# Patient Record
Sex: Female | Born: 1985
Health system: Southern US, Community
[De-identification: ages and names within clinical notes are randomized; demographics above are authoritative.]

## PROBLEM LIST (undated history)

## (undated) DIAGNOSIS — M797 Fibromyalgia: Secondary | ICD-10-CM

## (undated) DIAGNOSIS — K760 Fatty (change of) liver, not elsewhere classified: Secondary | ICD-10-CM

## (undated) DIAGNOSIS — J309 Allergic rhinitis, unspecified: Secondary | ICD-10-CM

## (undated) DIAGNOSIS — K589 Irritable bowel syndrome without diarrhea: Secondary | ICD-10-CM

## (undated) DIAGNOSIS — Q796 Ehlers-Danlos syndrome, unspecified: Secondary | ICD-10-CM

## (undated) DIAGNOSIS — F32A Depression, unspecified: Secondary | ICD-10-CM

## (undated) DIAGNOSIS — J189 Pneumonia, unspecified organism: Secondary | ICD-10-CM

## (undated) DIAGNOSIS — J302 Other seasonal allergic rhinitis: Secondary | ICD-10-CM

## (undated) DIAGNOSIS — G43909 Migraine, unspecified, not intractable, without status migrainosus: Secondary | ICD-10-CM

## (undated) DIAGNOSIS — M179 Osteoarthritis of knee, unspecified: Secondary | ICD-10-CM

## (undated) DIAGNOSIS — E88819 Insulin resistance, unspecified: Secondary | ICD-10-CM

## (undated) DIAGNOSIS — F909 Attention-deficit hyperactivity disorder, unspecified type: Secondary | ICD-10-CM

## (undated) DIAGNOSIS — F419 Anxiety disorder, unspecified: Secondary | ICD-10-CM

## (undated) DIAGNOSIS — N200 Calculus of kidney: Secondary | ICD-10-CM

## (undated) DIAGNOSIS — K829 Disease of gallbladder, unspecified: Secondary | ICD-10-CM

## (undated) DIAGNOSIS — E559 Vitamin D deficiency, unspecified: Secondary | ICD-10-CM

## (undated) DIAGNOSIS — S3992XA Unspecified injury of lower back, initial encounter: Secondary | ICD-10-CM

## (undated) DIAGNOSIS — M549 Dorsalgia, unspecified: Secondary | ICD-10-CM

## (undated) DIAGNOSIS — Z9889 Other specified postprocedural states: Secondary | ICD-10-CM

## (undated) DIAGNOSIS — M2550 Pain in unspecified joint: Secondary | ICD-10-CM

## (undated) DIAGNOSIS — E669 Obesity, unspecified: Secondary | ICD-10-CM

## (undated) DIAGNOSIS — M1710 Unilateral primary osteoarthritis, unspecified knee: Secondary | ICD-10-CM

## (undated) DIAGNOSIS — F329 Major depressive disorder, single episode, unspecified: Secondary | ICD-10-CM

## (undated) DIAGNOSIS — R112 Nausea with vomiting, unspecified: Secondary | ICD-10-CM

## (undated) DIAGNOSIS — K219 Gastro-esophageal reflux disease without esophagitis: Secondary | ICD-10-CM

## (undated) DIAGNOSIS — E8881 Metabolic syndrome: Secondary | ICD-10-CM

## (undated) HISTORY — DX: Fatty (change of) liver, not elsewhere classified: K76.0

## (undated) HISTORY — PX: WRIST ARTHROSCOPY: SUR100

## (undated) HISTORY — DX: Depression, unspecified: F32.A

## (undated) HISTORY — DX: Fibromyalgia: M79.7

## (undated) HISTORY — DX: Gastro-esophageal reflux disease without esophagitis: K21.9

## (undated) HISTORY — DX: Disease of gallbladder, unspecified: K82.9

## (undated) HISTORY — PX: CHOLECYSTECTOMY: SHX55

## (undated) HISTORY — DX: Irritable bowel syndrome, unspecified: K58.9

## (undated) HISTORY — DX: Allergic rhinitis, unspecified: J30.9

## (undated) HISTORY — DX: Calculus of kidney: N20.0

## (undated) HISTORY — DX: Pain in unspecified joint: M25.50

## (undated) HISTORY — DX: Ehlers-Danlos syndrome, unspecified: Q79.60

## (undated) HISTORY — DX: Attention-deficit hyperactivity disorder, unspecified type: F90.9

## (undated) HISTORY — DX: Pneumonia, unspecified organism: J18.9

## (undated) HISTORY — DX: Dorsalgia, unspecified: M54.9

## (undated) HISTORY — PX: WISDOM TOOTH EXTRACTION: SHX21

## (undated) HISTORY — DX: Anxiety disorder, unspecified: F41.9

## (undated) HISTORY — DX: Unspecified injury of lower back, initial encounter: S39.92XA

## (undated) HISTORY — PX: KNEE ARTHROSCOPY: SUR90

## (undated) HISTORY — DX: Major depressive disorder, single episode, unspecified: F32.9

## (undated) HISTORY — PX: MOUTH SURGERY: SHX715

## (undated) HISTORY — PX: DG DILATION URETERS: HXRAD351

---
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
## 2009-08-10 ENCOUNTER — Other Ambulatory Visit: Admission: RE | Admit: 2009-08-10 | Discharge: 2009-08-10 | Payer: Self-pay | Admitting: Family Medicine

## 2010-06-23 ENCOUNTER — Ambulatory Visit: Payer: Self-pay | Admitting: Women's Health

## 2010-11-08 ENCOUNTER — Ambulatory Visit
Admission: RE | Admit: 2010-11-08 | Discharge: 2010-11-08 | Disposition: A | Discharge status: Home / Self Care | Payer: BC Managed Care – PPO | Source: Ambulatory Visit | Attending: Allergy | Admitting: Allergy

## 2010-11-08 ENCOUNTER — Other Ambulatory Visit: Payer: Self-pay | Admitting: Allergy

## 2010-11-08 DIAGNOSIS — J45909 Unspecified asthma, uncomplicated: Secondary | ICD-10-CM

## 2010-11-29 ENCOUNTER — Encounter (INDEPENDENT_AMBULATORY_CARE_PROVIDER_SITE_OTHER): Payer: BC Managed Care – PPO | Admitting: Women's Health

## 2010-11-29 ENCOUNTER — Other Ambulatory Visit (HOSPITAL_COMMUNITY)
Admission: RE | Admit: 2010-11-29 | Discharge: 2010-11-29 | Disposition: A | Discharge status: Home / Self Care | Payer: BC Managed Care – PPO | Source: Ambulatory Visit | Attending: Gynecology | Admitting: Gynecology

## 2010-11-29 ENCOUNTER — Other Ambulatory Visit: Payer: Self-pay | Admitting: Women's Health

## 2010-11-29 DIAGNOSIS — Z124 Encounter for screening for malignant neoplasm of cervix: Secondary | ICD-10-CM | POA: Insufficient documentation

## 2010-11-29 DIAGNOSIS — Z23 Encounter for immunization: Secondary | ICD-10-CM

## 2010-11-29 DIAGNOSIS — Z01419 Encounter for gynecological examination (general) (routine) without abnormal findings: Secondary | ICD-10-CM

## 2010-12-08 NOTE — Op Note (Signed)
[place]  Patient:    Cynthia [name], Cynthia [name] Visit Number: [id number] MRN: [id number]          Service Type: DSU Location: [facility] Attending Physician:  Verlee Rossetti. Dictated by:   Almedia Balls Ranell Patrick, M.D. Proc. Date: 07/17/01 Admit Date:  07/17/2001 Discharge Date: 07/17/2001                             Operative Report  PREOPERATIVE DIAGNOSIS:  Right shoulder pain.  POSTOPERATIVE DIAGNOSIS:  Right shoulder multidirectional instability.  PROCEDURE PERFORMED:  Right shoulder diagnostic arthroscopy, examination under anesthesia, and arthroscopic thermal capsulorrhaphy.  SURGEON:  Almedia Balls. Ranell Patrick, M.D.  ANESTHESIA:  General.  ESTIMATED BLOOD LOSS:  Minimal.  FLUID REPLACEMENT:  800 cc of crystalloid.  INSTRUMENT COUNT:  Correct.  COMPLICATIONS:  None.  Perioperative antibiotics were given.  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  The patient reports a fall on February 22, 2001, playing directly on to her elbow, causing her to experiencing a jarring injury to her right shoulder.  She has had persistent pain since that time.  She is heavily involved in cheerleading, training almost every day, and has had difficulty with over head activities and "basing."   This involves supporting other cheerleaders with her hands above her head.  The pain is in the posterior aspect of her shoulder and is muscular in its character.  On physical examination in the office initially, the patient was felt to have multidirectional instability, primarily anterior and inferior with positive subluxation and relocation test, and no other obvious pathology.  She had an MRI scan which did not demonstrate obvious labral pathology, and was seen by Dr. Valma Cava who was concerned as well about her lack of improvement despite numerous physical therapy appointments and significant  improvement in her shoulder stabilizing muscles.  After careful consultation with the patient and her parents concerning options for management for the patients shoulder pain include activity modification, continued physical therapy, and medications versus surgical evaluation and treatment of her shoulder pain, and they elected to proceed with surgery to rule out labral pathology versus shoulder laxity.  DESCRIPTION OF PROCEDURE:  After an adequate level of general anesthesia was achieved, the patient was positioned in the modified beach chair position, and all neurovascular structures were padded appropriately.  The right arm was then examined under anesthesia and was noted to have a negative sulcus. Forward flexion was 180 degrees.  Abduction was 120 degrees.  External rotation was 90 degrees and internal rotation was 85 degrees.  There was a 1-2+ anterior load and shift with the ability to perch the humeral head on the anterior glenoid rim, but not dislocate the shoulder.  There was no posterior load and shift present.  There is no posterior instability with the elbow extended, and a posterior directed force with the arm internally rotated. During external rotation of the shoulder with the arm abducted, there appeared to be no tensioning of the anterior-inferior glenohumeral ligament with persistent 1-2+ anterior drawer present.  After completion of the exam under anesthesia, the right shoulder and arm were prepped and draped in their entirety in the usual sterile fashion.  Diagnostic arthroscopy was performed through standard posterior and anterior arthroscopic portals.  This revealed no evidence of pathology at the superior labrum, biceps anchor.  The middle glenohumeral ligament was identified and it was noted  to be intact.  Subscapularis was also noted to be intact and normal in appearance.  Rotator interval appeared to be wispy and thin.  The rotator cuff was completely normal in  its appearance and insertion.  The glenohumeral articular cartilage was also normal with no evidence of abnormal wear.  There was no internal impingement with abduction, external rotation of the shoulder. Posterior labrum was also noted to be intact with the arthroscope placed in the anterior portal.  The pouch appeared to be patulous and the inferior glenohumeral ligament appeared to be laxed.  There was an obviously positive drive through with the arm abducted and externally rotated as well as with the arm in the neutral.  These arthroscopic findings were consistent with her examination under anesthesia, demonstrating anterior and inferior laxity.  It was decided at this time to proceed with thermal capsulorrhaphy which was performed using the ArthroCare capture wand system.  A 30 degree wand was selected.  A cross-_________ thermal pattern was then performed, concentrating on the anterior and inferior capsule to include the anterior band of the inferior glenohumeral ligament.  The middle glenohumeral ligament was also tightened minimally.  The scope and probe were then reversed and a mild amount of shrinkage was performed on the posterior aspect of the capsule.  After completion of thermal capsulorrhaphy, the scope was placed back in the posterior aspect of the shoulder and an attempted drive through sign was performed.  This was unsuccessful.  The patient was then placed in the inferior pouch.  There was noted to be a dramatic decrease in the size of the pouch.  In addition, the anterior and inferior labrum which appeared to be more prominent, and the anterior band of the inferior glenohumeral ligament appeared to be more tense, consistent with a successful shrinkage.  The arthroscopy was then concluded.  Wounds were sutured using 4-0 Monocryl subcuticular followed by Steri-Strips, and a sterile bandage was applied followed by a shoulder immobilizer.  The patient was taken in stable  condition, extubated to PACU. Dictated by:   Almedia Balls Ranell Patrick, M.D. Attending Physician:  Malon Kindle R. DD:  07/19/01 TD:  07/20/01 Job: [id number]  [id number]

## 2011-03-08 ENCOUNTER — Other Ambulatory Visit: Payer: Self-pay | Admitting: Allergy

## 2011-03-08 ENCOUNTER — Ambulatory Visit
Admission: RE | Admit: 2011-03-08 | Discharge: 2011-03-08 | Disposition: A | Discharge status: Home / Self Care | Payer: BC Managed Care – PPO | Source: Ambulatory Visit | Attending: Allergy | Admitting: Allergy

## 2011-03-08 DIAGNOSIS — R059 Cough, unspecified: Secondary | ICD-10-CM

## 2011-03-08 DIAGNOSIS — R062 Wheezing: Secondary | ICD-10-CM

## 2011-03-08 DIAGNOSIS — R05 Cough: Secondary | ICD-10-CM

## 2011-06-08 ENCOUNTER — Other Ambulatory Visit: Payer: Self-pay | Admitting: *Deleted

## 2011-06-08 ENCOUNTER — Ambulatory Visit: Payer: BC Managed Care – PPO

## 2011-06-08 MED ORDER — NORETHINDRONE 0.35 MG PO TABS: 1.0000 | ORAL_TABLET | Freq: Every day | ORAL | Status: DC

## 2011-06-25 DIAGNOSIS — J45909 Unspecified asthma, uncomplicated: Secondary | ICD-10-CM | POA: Insufficient documentation

## 2011-06-25 DIAGNOSIS — S3992XA Unspecified injury of lower back, initial encounter: Secondary | ICD-10-CM | POA: Insufficient documentation

## 2011-06-25 DIAGNOSIS — J189 Pneumonia, unspecified organism: Secondary | ICD-10-CM | POA: Insufficient documentation

## 2011-07-02 ENCOUNTER — Ambulatory Visit (INDEPENDENT_AMBULATORY_CARE_PROVIDER_SITE_OTHER): Payer: BC Managed Care – PPO | Admitting: Women's Health

## 2011-07-02 VITALS — BP 114/78

## 2011-07-02 DIAGNOSIS — IMO0001 Reserved for inherently not codable concepts without codable children: Secondary | ICD-10-CM

## 2011-07-02 DIAGNOSIS — Z309 Encounter for contraceptive management, unspecified: Secondary | ICD-10-CM

## 2011-07-02 DIAGNOSIS — Z23 Encounter for immunization: Secondary | ICD-10-CM

## 2011-07-02 NOTE — Progress Notes (Signed)
Patient ID: Cynthia [name], female   DOB: [birth year], 25 y.o.   MRN: [id number] Presents for second Gardasil and to discuss blood pressure and birth control for cycle control. Virgin,  states since on Micronor cycles have lasted 7-8 days heavier than her usual and states has felt moody on Micronor. Blood pressure was elevated at annual exam in May 160/80, but had been on steroids and numerous medications for asthma. Changed from Loestrin 1/20 to Micronor in May. States blood pressure has been down in the normal range in the summer months when on no asthma medications. BP today 114/78.  Second Gardasil given today. Will try the lo Loestrin FE. Instructed to start first day of her next cycle. Has annual exam with her primary care in January. Reviewed importance of stopping lo Loestrin if blood pressure greater than 130/80. No prescription given, 3 sample packs given. Return to office in April for third and final Gardasil and blood pressure check.

## 2011-07-02 NOTE — Patient Instructions (Signed)
B/P114/78  Gardasil in April for 3 rd gardasil and B/P check  Start Loloestrin on first day of cycle

## 2011-07-02 NOTE — Progress Notes (Signed)
Addended by: Venora Maples on: 07/02/2011 09:36 AM   Modules accepted: Orders

## 2011-09-24 ENCOUNTER — Telehealth: Payer: Self-pay | Admitting: *Deleted

## 2011-09-24 MED ORDER — NORETHIN ACE-ETH ESTRAD-FE 1-20 MG-MCG(24) PO TABS: 1.0000 | ORAL_TABLET | Freq: Every day | ORAL | Status: DC

## 2011-09-24 NOTE — Telephone Encounter (Signed)
(  f/u from 06/2011 OV) Pt called wanting to relay her blood pressure reading. Pt said for 3 months she has check B/P and it has been in range of 115-120 over 80. Pt asked if birth control could be filled?

## 2011-09-24 NOTE — Telephone Encounter (Signed)
Pt informed rx sent to pharmacy.

## 2011-09-24 NOTE — Telephone Encounter (Signed)
Yes okay to call in one pack with one refill.

## 2011-09-24 NOTE — Telephone Encounter (Signed)
Pt said that her annual with her PCP will be in march. Pt is on last row and will run out on Saturday, she said that she has been taking reading herself. Okay to call in 1 pack?

## 2011-09-24 NOTE — Telephone Encounter (Signed)
Annual exam is due in April, we'll check blood pressure and give 3rd gardasil. Ask her if her blood pressure was normal at her January office visit with primary care. If okay: One pack of the lo Loestrin FE or she can pick up a sample pack.

## 2011-10-22 ENCOUNTER — Other Ambulatory Visit: Payer: Self-pay | Admitting: *Deleted

## 2011-10-22 MED ORDER — NORETHIN ACE-ETH ESTRAD-FE 1-20 MG-MCG(24) PO TABS: 1.0000 | ORAL_TABLET | Freq: Every day | ORAL | Status: DC

## 2011-12-18 ENCOUNTER — Other Ambulatory Visit: Payer: Self-pay | Admitting: *Deleted

## 2011-12-20 ENCOUNTER — Other Ambulatory Visit: Payer: Self-pay | Admitting: *Deleted

## 2011-12-20 MED ORDER — NORETHIN ACE-ETH ESTRAD-FE 1-20 MG-MCG(24) PO TABS: 1.0000 | ORAL_TABLET | Freq: Every day | ORAL | Status: DC

## 2011-12-20 NOTE — Telephone Encounter (Signed)
Pt called requesting refill on her loestrin fe pill, pt overdue to annual, transferred to appointment desk to schedule. 1 pack sent to pharmacy.

## 2012-01-09 ENCOUNTER — Ambulatory Visit (INDEPENDENT_AMBULATORY_CARE_PROVIDER_SITE_OTHER): Payer: BC Managed Care – PPO | Admitting: Women's Health

## 2012-01-09 ENCOUNTER — Encounter: Payer: Self-pay | Admitting: Women's Health

## 2012-01-09 VITALS — BP 120/88 | Ht 61.0 in | Wt 248.0 lb

## 2012-01-09 DIAGNOSIS — E66813 Obesity, class 3: Secondary | ICD-10-CM | POA: Insufficient documentation

## 2012-01-09 DIAGNOSIS — N946 Dysmenorrhea, unspecified: Secondary | ICD-10-CM

## 2012-01-09 DIAGNOSIS — E669 Obesity, unspecified: Secondary | ICD-10-CM

## 2012-01-09 DIAGNOSIS — Z01419 Encounter for gynecological examination (general) (routine) without abnormal findings: Secondary | ICD-10-CM

## 2012-01-09 DIAGNOSIS — Z23 Encounter for immunization: Secondary | ICD-10-CM

## 2012-01-09 DIAGNOSIS — Z833 Family history of diabetes mellitus: Secondary | ICD-10-CM

## 2012-01-09 DIAGNOSIS — Z13220 Encounter for screening for lipoid disorders: Secondary | ICD-10-CM

## 2012-01-09 DIAGNOSIS — E079 Disorder of thyroid, unspecified: Secondary | ICD-10-CM

## 2012-01-09 LAB — CBC WITH DIFFERENTIAL/PLATELET
Basophils Absolute: 0 10*3/uL (ref 0.0–0.1)
Basophils Relative: 0 % (ref 0–1)
Eosinophils Absolute: 0.1 10*3/uL (ref 0.0–0.7)
Eosinophils Relative: 1 % (ref 0–5)
HCT: 45 % (ref 36.0–46.0)
Hemoglobin: 15 g/dL (ref 12.0–15.0)
Lymphocytes Relative: 22 % (ref 12–46)
Lymphs Abs: 1.7 10*3/uL (ref 0.7–4.0)
MCH: 29.6 pg (ref 26.0–34.0)
MCHC: 33.3 g/dL (ref 30.0–36.0)
MCV: 88.9 fL (ref 78.0–100.0)
Monocytes Absolute: 0.7 10*3/uL (ref 0.1–1.0)
Monocytes Relative: 9 % (ref 3–12)
Neutro Abs: 5.3 10*3/uL (ref 1.7–7.7)
Neutrophils Relative %: 68 % (ref 43–77)
Platelets: 285 10*3/uL (ref 150–400)
RBC: 5.06 MIL/uL (ref 3.87–5.11)
RDW: 13.8 % (ref 11.5–15.5)
WBC: 7.8 10*3/uL (ref 4.0–10.5)

## 2012-01-09 LAB — LIPID PANEL
Cholesterol: 175 mg/dL (ref 0–200)
HDL: 52 mg/dL (ref >39)
LDL Cholesterol: 109 mg/dL — ABNORMAL HIGH (ref 0–99)
Total CHOL/HDL Ratio: 3.4 Ratio
Triglycerides: 71 mg/dL (ref <150)
VLDL: 14 mg/dL (ref 0–40)

## 2012-01-09 LAB — TSH: TSH: 1.85 u[IU]/mL (ref 0.350–4.500)

## 2012-01-09 LAB — GLUCOSE, RANDOM: Glucose, Bld: 88 mg/dL (ref 70–99)

## 2012-01-09 MED ORDER — NORETHIN ACE-ETH ESTRAD-FE 1-20 MG-MCG(24) PO TABS: 1.0000 | ORAL_TABLET | Freq: Every day | ORAL | Status: DC

## 2012-01-09 NOTE — Progress Notes (Signed)
Cynthia [name] 12/13/1985 [id number]    History:    The patient presents for annual exam.  Monthly 3-5 day cycle on Loestrin 1/20 for menorrhagia and dysmenorrhea with good relief. Virgin. Gardasil series completed today. History of asthma and ADHD primary care prescribes meds.   Past medical history, past surgical history, family history and social history were all reviewed and documented in the EPIC chart. Works for [employer] in [place].   ROS:  A  ROS was performed and pertinent positives and negatives are included in the history.  Exam:  Filed Vitals:   01/09/12 0915  BP: 120/88    General appearance:  Normal Head/Neck:  Normal, without cervical or supraclavicular adenopathy. Thyroid:  Symmetrical, normal in size, without palpable masses or nodularity. Respiratory  Effort:  Normal  Auscultation:  Clear without wheezing or rhonchi Cardiovascular  Auscultation:  Regular rate, without rubs, murmurs or gallops  Edema/varicosities:  Not grossly evident Abdominal  Soft,nontender, without masses, guarding or rebound.  Liver/spleen:  No organomegaly noted  Hernia:  None appreciated  Skin  Inspection:  Grossly normal  Palpation:  Grossly normal Neurologic/psychiatric  Orientation:  Normal with appropriate conversation.  Mood/affect:  Normal  Genitourinary    Breasts: Examined lying and sitting.     Right: Without masses, retractions, discharge or axillary adenopathy.     Left: Without masses, retractions, discharge or axillary adenopathy.   Inguinal/mons:  Normal without inguinal adenopathy  External genitalia:  Normal/hymenal ring at the introitus  BUS/Urethra/Skene's glands:  Normal  Bladder:  Normal  Vagina:  Normal  Cervix:  Normal  Uterus:   normal in size, shape and contour.  Midline and mobile  Adnexa/parametria:     Rt: Without masses or tenderness.   Lt: Without masses or tenderness.  Anus and perineum: Normal  Digital rectal exam: Normal  sphincter tone without palpated masses or tenderness  Assessment/Plan:  26 y.o. S [place] Virgin for annual exam with no complaints.  Blood pressure elevated at office visit today states normal 110/70 at primary care last week Morbid obesity Menorrhagia/dysmenorrhea good relief with Loestrin 1/20  Plan: Check blood pressure away from office call if pressure greater than 130/80. Reviewed hazards of blood clots and strokes with birth control with elevated blood pressure. Loestrin 1/20 prescription, proper use, condoms if become sexually active. SBE's, increase exercise, decrease calories for weight loss for health. MVI daily encouraged. CBC, glucose, TSH, UA. No Pap, normal Pap history, new guidelines reviewed.    Harrington Challenger WHNP, 1:22 PM 01/09/2012

## 2012-01-09 NOTE — Patient Instructions (Signed)

## 2012-01-10 LAB — URINALYSIS W MICROSCOPIC + REFLEX CULTURE
Bilirubin Urine: NEGATIVE
Casts: NONE SEEN
Crystals: NONE SEEN
Glucose, UA: NEGATIVE mg/dL
Hgb urine dipstick: NEGATIVE
Ketones, ur: NEGATIVE mg/dL
Leukocytes, UA: NEGATIVE
Nitrite: NEGATIVE
Protein, ur: NEGATIVE mg/dL
Specific Gravity, Urine: 1.02 (ref 1.005–1.030)
Urobilinogen, UA: 0.2 mg/dL (ref 0.0–1.0)
pH: 6 (ref 5.0–8.0)

## 2012-03-31 ENCOUNTER — Telehealth: Payer: Self-pay | Admitting: *Deleted

## 2012-03-31 DIAGNOSIS — N946 Dysmenorrhea, unspecified: Secondary | ICD-10-CM

## 2012-03-31 MED ORDER — NORETHINDRONE ACET-ETHINYL EST 1-20 MG-MCG PO TABS: 1.0000 | ORAL_TABLET | Freq: Every day | ORAL | Status: DC

## 2012-03-31 NOTE — Telephone Encounter (Signed)
Pt Cynthia [name] has been discontinued and she will need a new Rx for birth control pills. Please advise

## 2012-03-31 NOTE — Telephone Encounter (Signed)
Please call in loestrin 1/20 with refills.  Explain same med but for 21 days instead of 24.  Have her call if problems with change.

## 2012-03-31 NOTE — Telephone Encounter (Signed)
Pt informed with the below note, rx sent. 

## 2012-04-10 ENCOUNTER — Telehealth: Payer: Self-pay | Admitting: *Deleted

## 2012-04-10 MED ORDER — NORETHIN ACE-ETH ESTRAD-FE 1-20 MG-MCG(24) PO CHEW: 20.0000 ug | CHEWABLE_TABLET | Freq: Every day | ORAL | Status: DC

## 2012-04-10 NOTE — Telephone Encounter (Signed)
rx sent and pt informed with the below.

## 2012-04-10 NOTE — Telephone Encounter (Signed)
Okay to have Minastrin 24, coupon in closet to help with cost. Loestrin 1/20  is  21 day cycle/ generic,  it has 24 active days.

## 2012-04-10 NOTE — Telephone Encounter (Signed)
Follow up from telephone encounter 03/31/12. Pt asked if she could have the Minastrin 24 she would prefer to have 24 pill rather than 21. Okay to send rx? Please advise

## 2012-07-17 IMAGING — CR DG CHEST 2V
2 series · 2 of 2 positions shown · non-contrast
Comparison: None.

CLINICAL DATA: Asthma, cough, congestion and fever.

CHEST - 2 VIEW

[view not recorded (1 of 2)]
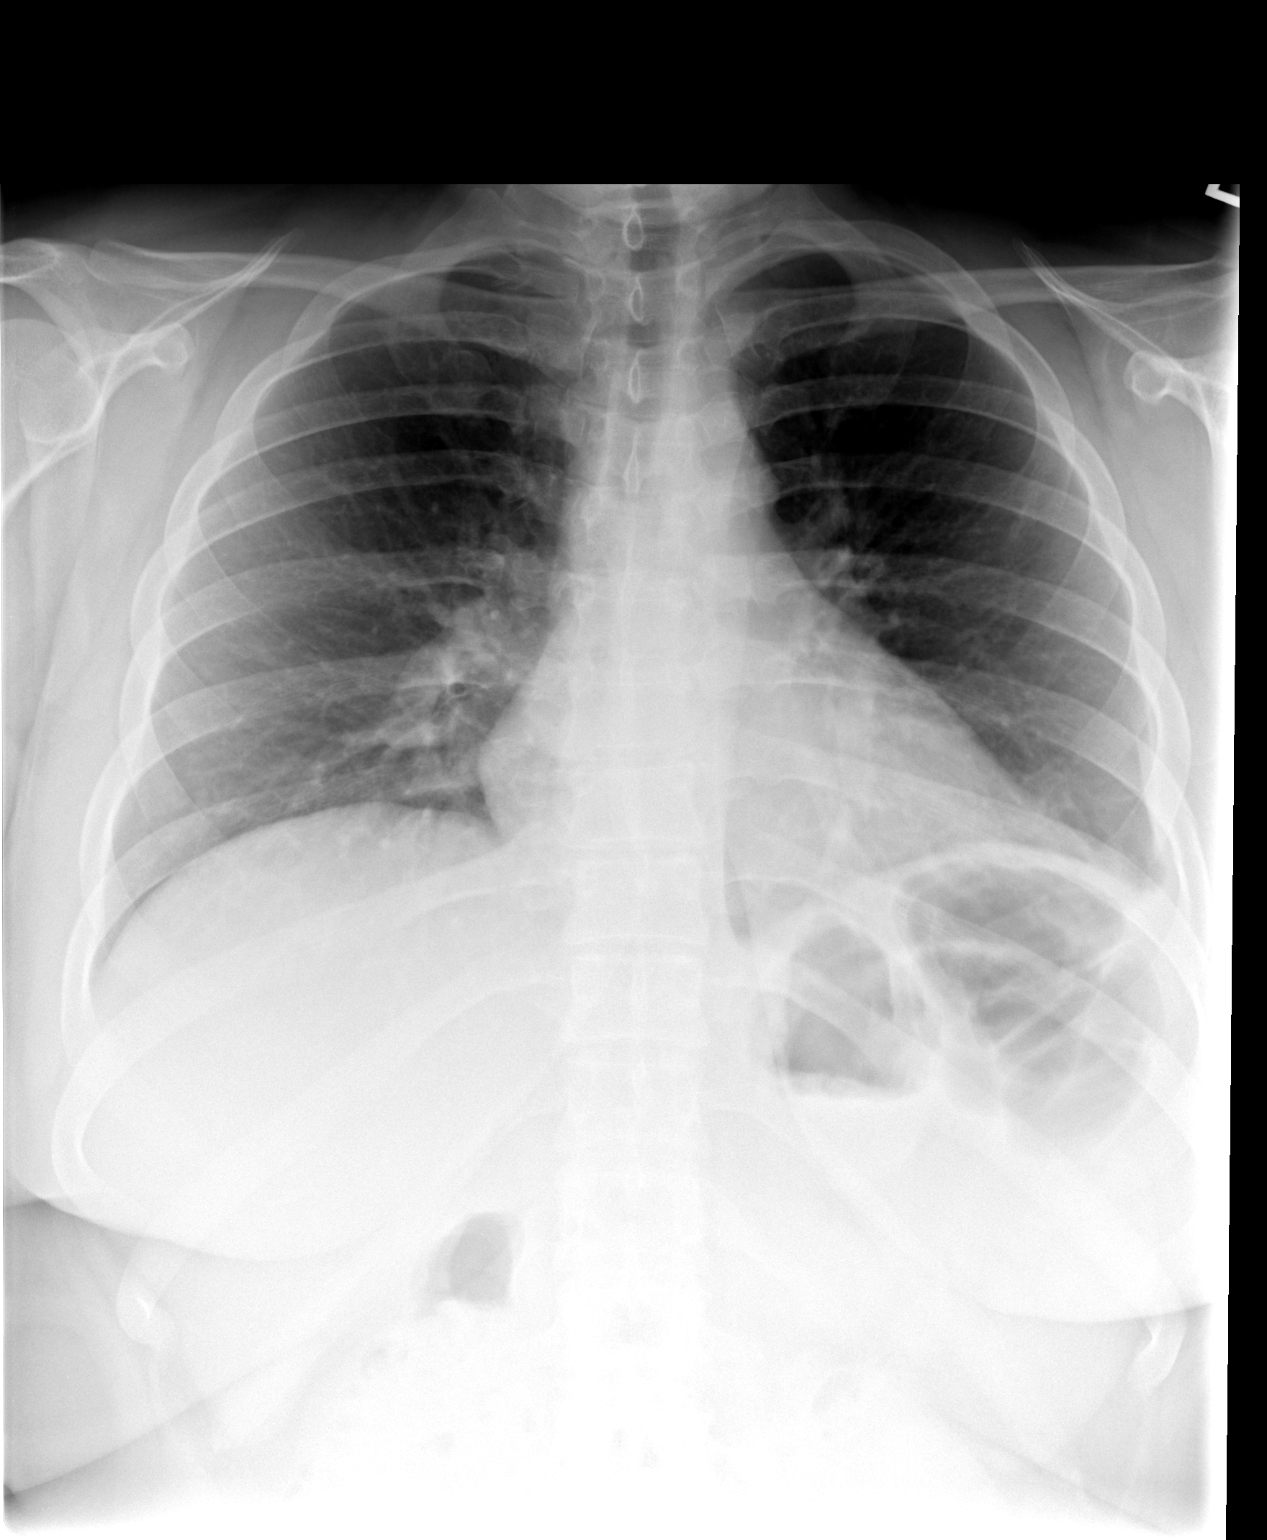

[view not recorded (2 of 2)]
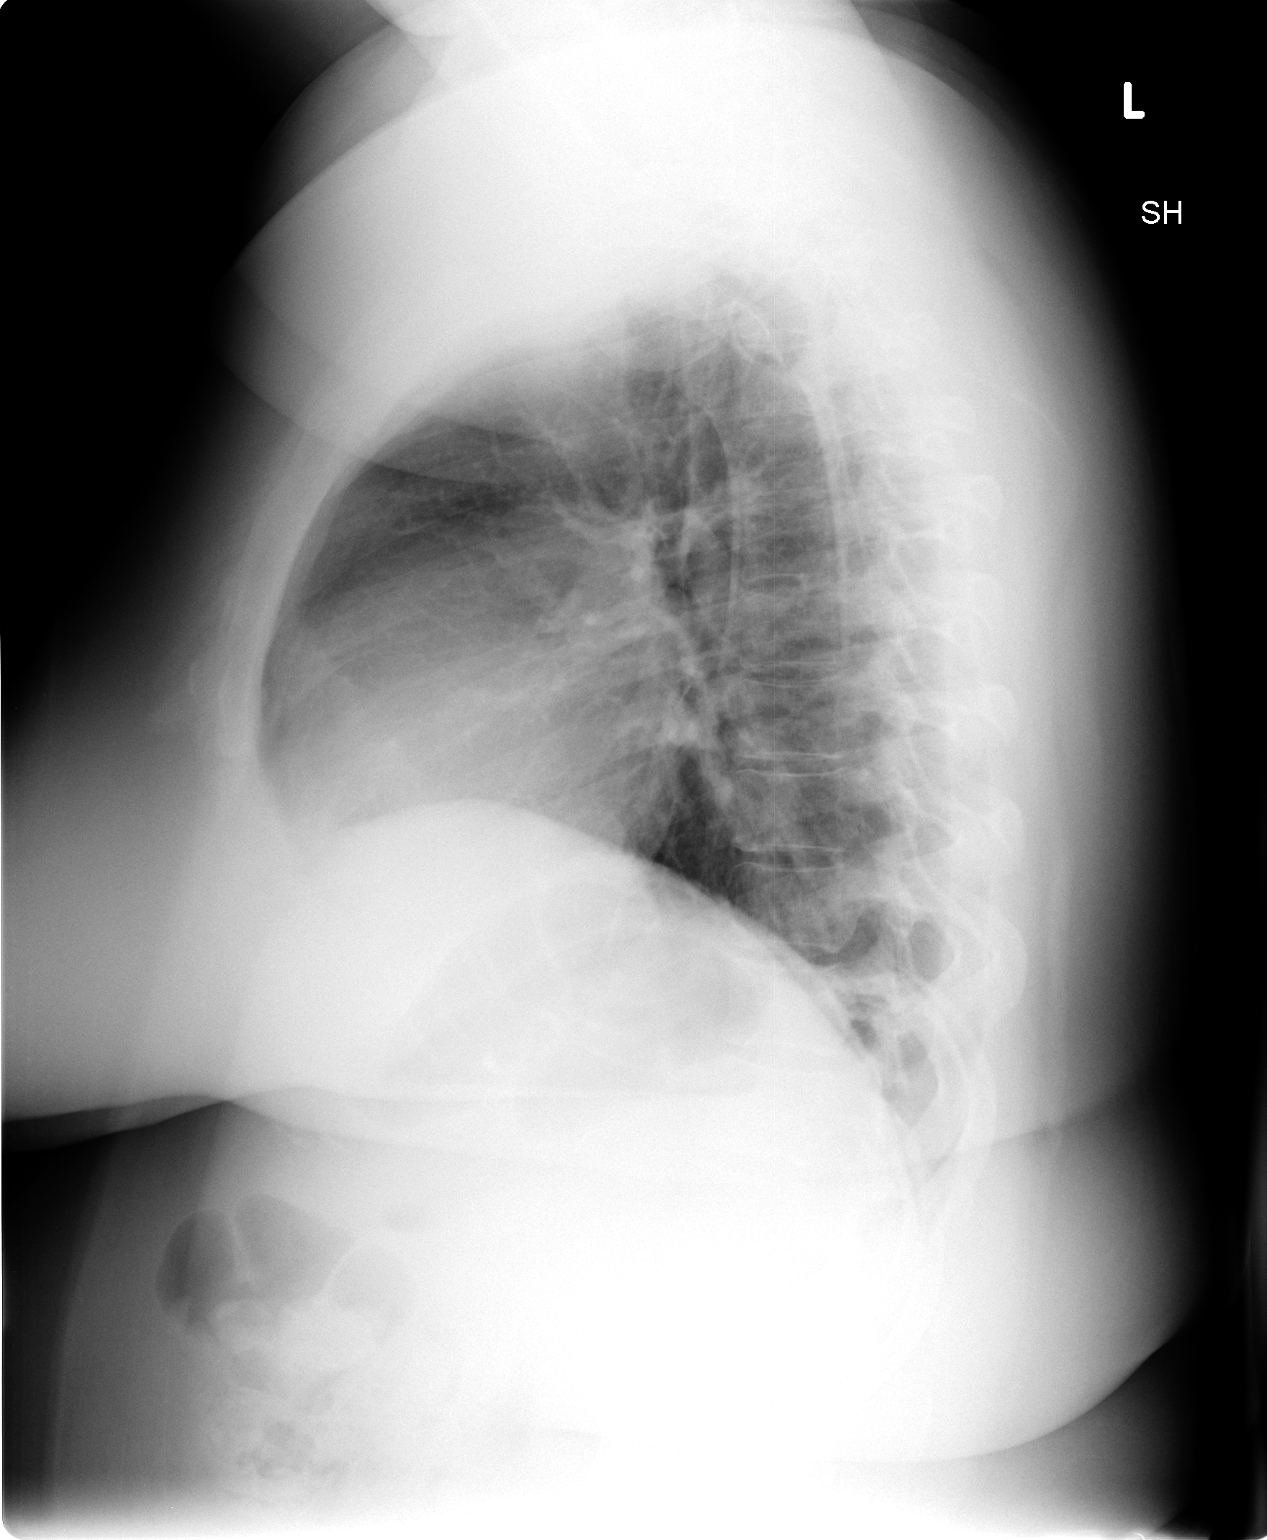

[2 of 2 positions shown; findings below may reference images not displayed]

FINDINGS: Trachea is midline.  Heart size normal.  Lungs are low in
volume with linear densities in the left lower lobe.  Blunting of
the left costophrenic angle.  Right lung is clear.
IMPRESSION: 1.  Left lower lobe subsegmental atelectasis and/or scarring.
2.  Tiny left pleural effusion or pleural thickening.

## 2012-11-14 IMAGING — CR DG CHEST 2V
2 series · 2 of 2 positions shown · non-contrast
Comparison: 11/08/2010

CLINICAL DATA: Cough, wheezing, left rib pain

CHEST - 2 VIEW

[view not recorded (1 of 2)]
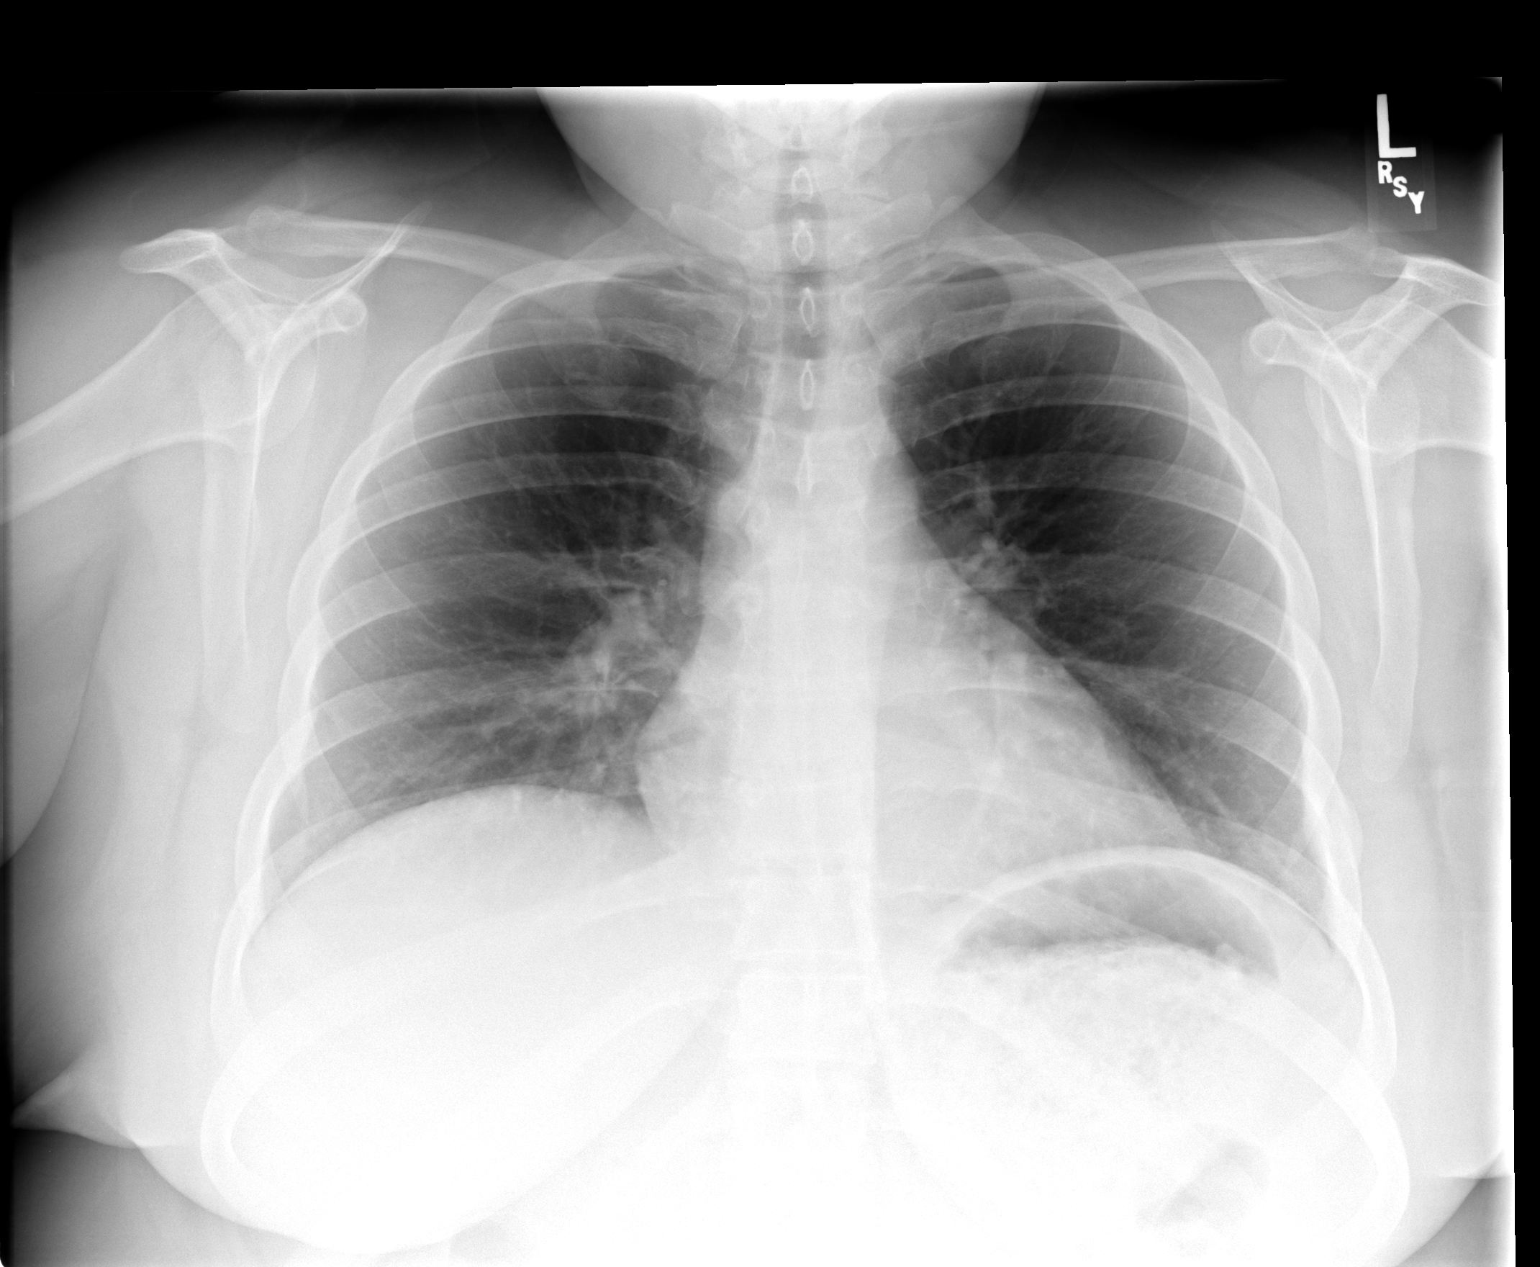

[view not recorded (2 of 2)]
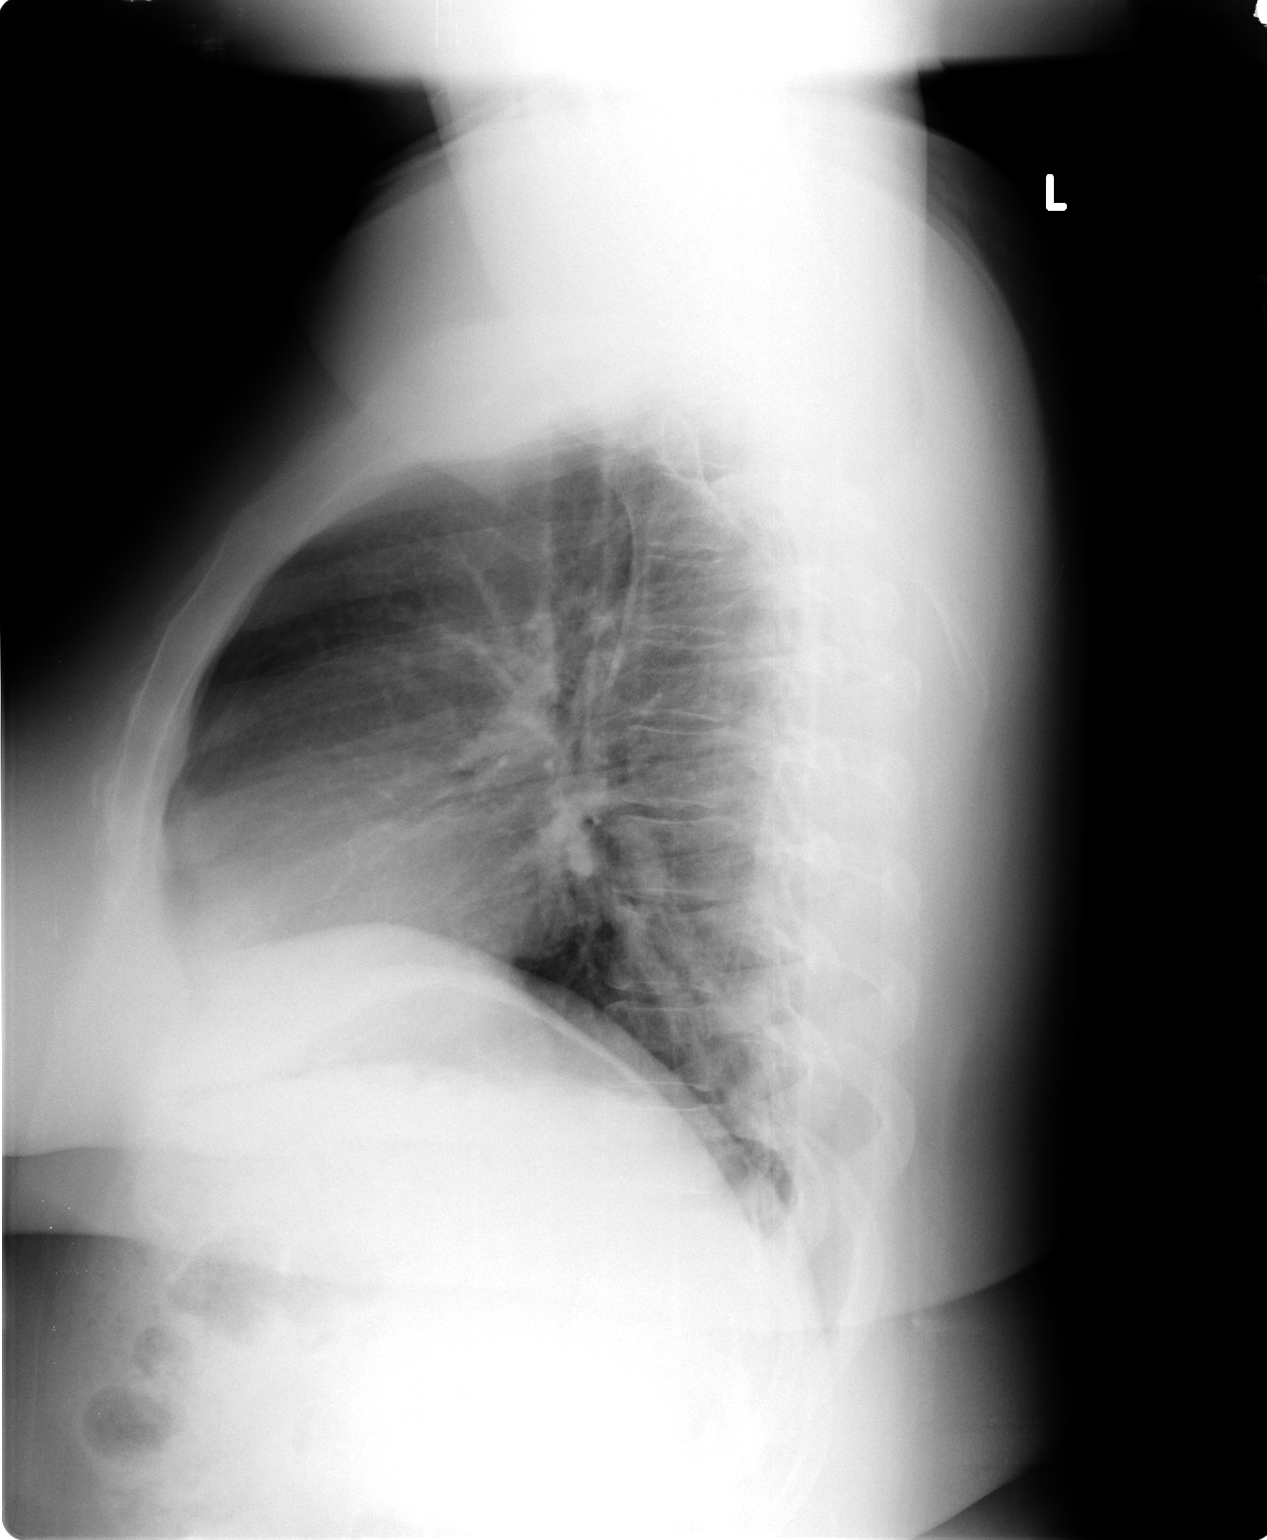

[2 of 2 positions shown; findings below may reference images not displayed]

FINDINGS: Unchanged cardiac silhouette and mediastinal contours.
Improved inspiratory effort with interval resolution of left lower
lobe heterogeneous opacities and aeration of the left costophrenic
angle.  No new focal parenchymal opacities.  No pleural effusion or
pneumothorax.  Interval callus formation about the posterior
lateral aspect of the left seventh rib.
IMPRESSION: 1.  No acute cardiopulmonary disease.

2. Interval callous formation about the posterior lateral aspect of
the left seventh rib, suggestive of remote injury.

## 2013-03-18 ENCOUNTER — Other Ambulatory Visit: Payer: Self-pay | Admitting: Family Medicine

## 2013-03-18 DIAGNOSIS — R1011 Right upper quadrant pain: Secondary | ICD-10-CM

## 2013-03-19 ENCOUNTER — Ambulatory Visit
Admission: RE | Admit: 2013-03-19 | Discharge: 2013-03-19 | Disposition: A | Discharge status: Home / Self Care | Payer: BC Managed Care – PPO | Source: Ambulatory Visit | Attending: Family Medicine | Admitting: Family Medicine

## 2013-03-19 ENCOUNTER — Other Ambulatory Visit: Payer: Self-pay | Admitting: Family Medicine

## 2013-03-19 DIAGNOSIS — R1011 Right upper quadrant pain: Secondary | ICD-10-CM

## 2013-03-20 ENCOUNTER — Encounter (INDEPENDENT_AMBULATORY_CARE_PROVIDER_SITE_OTHER): Payer: Self-pay | Admitting: Surgery

## 2013-03-20 ENCOUNTER — Other Ambulatory Visit: Payer: BC Managed Care – PPO

## 2013-03-30 ENCOUNTER — Ambulatory Visit (INDEPENDENT_AMBULATORY_CARE_PROVIDER_SITE_OTHER): Payer: BC Managed Care – PPO | Admitting: Surgery

## 2013-03-30 ENCOUNTER — Encounter (INDEPENDENT_AMBULATORY_CARE_PROVIDER_SITE_OTHER): Payer: Self-pay | Admitting: Surgery

## 2013-03-30 VITALS — BP 132/78 | HR 72 | Resp 14 | Ht 61.5 in | Wt 288.8 lb

## 2013-03-30 DIAGNOSIS — K8010 Calculus of gallbladder with chronic cholecystitis without obstruction: Secondary | ICD-10-CM | POA: Insufficient documentation

## 2013-03-30 NOTE — Progress Notes (Signed)
Patient ID: Cynthia [name], female   DOB: 09/19/1985, 27 y.o.   MRN: [id number]  Chief Complaint  Patient presents with  . New Evaluation    eval GB    HPI Cynthia [name] is a 27 y.o. female.  Referred by Dr. Ihor Dow for evaluation of gallbladder disease.  HPI  This is a 27 year old female who presents with a two-week history of acute onset of upper abdominal pain radiating around to her right shoulder and in her back. This was associated with nausea and bloating. This occurred after eating at [facility]. She denies any vomiting or diarrhea. She was evaluated at her PCPs office and was noted to have a normal white blood cell count, normal LFTs, and normal lipase. Ultrasound showed a contracted gallbladder containing gallstones but no sign of cholecystitis. She has had mild symptoms since that time. She has had milder episodes going back as far as 3 years ago. She presents now to discuss surgery.  Past Medical History  Diagnosis Date  . ADHD (attention deficit hyperactivity disorder)   . Asthma   . Pneumonia   . Back injury     COMPETITIVE INJURY IN HIGH SCHOOL  . Depression   . Anxiety     Past Surgical History  Procedure Laterality Date  . Shoulder surgery  2002    RIGHT    Family History  Problem Relation Age of Onset  . Multiple sclerosis Mother   . Hypertension Father   . Diabetes Maternal Grandfather   . Cancer Maternal Grandfather     prostate  . Heart disease Maternal Grandfather   . Heart disease Paternal Grandfather     Social History History  Substance Use Topics  . Smoking status: Never Smoker   . Smokeless tobacco: Never Used  . Alcohol Use: Yes    Allergies  Allergen Reactions  . Penicillins   rash   Current Outpatient Prescriptions  Medication Sig Dispense Refill  . amphetamine-dextroamphetamine (ADDERALL XR) 30 MG 24 hr capsule Take 30 mg by mouth every morning.        . Budesonide-Formoterol Fumarate (SYMBICORT IN) Inhale into the lungs.         . Desloratadine (CLARINEX PO) Take by mouth.        Marland Kitchen ibuprofen (ADVIL,MOTRIN) 800 MG tablet Take 800 mg by mouth every 8 (eight) hours as needed for pain.      Marland Kitchen levalbuterol (XOPENEX HFA) 45 MCG/ACT inhaler Inhale 1-2 puffs into the lungs every 4 (four) hours as needed.        . Montelukast Sodium (SINGULAIR PO) Take by mouth.        . Norethin Ace-Eth Estrad-FE (MINASTRIN 24 FE) 1-20 MG-MCG(24) CHEW Chew 20 mcg by mouth daily.  28 tablet  11  . norethindrone-ethinyl estradiol (MICROGESTIN,JUNEL,LOESTRIN) 1-20 MG-MCG tablet Take 1 tablet by mouth daily.      Marland Kitchen PRESCRIPTION MEDICATION Q-NASAL SPRAY USES TWICE DAILY       . traZODone (DESYREL) 100 MG tablet Take 200 mg by mouth at bedtime.        . Bepotastine Besilate (BEPREVE OP) Apply to eye.        Marland Kitchen olopatadine (PATANOL) 0.1 % ophthalmic solution 1 drop 2 (two) times daily.       No current facility-administered medications for this visit.    Review of Systems Review of Systems  Constitutional: Negative for fever, chills and unexpected weight change.  HENT: Negative for hearing loss, congestion, sore throat, trouble swallowing and voice  change.   Eyes: Negative for visual disturbance.  Respiratory: Negative for cough and wheezing.   Cardiovascular: Negative for chest pain, palpitations and leg swelling.  Gastrointestinal: Positive for nausea, abdominal pain and abdominal distention. Negative for vomiting, diarrhea, constipation, blood in stool and anal bleeding.  Genitourinary: Negative for hematuria, vaginal bleeding and difficulty urinating.  Musculoskeletal: Negative for arthralgias.  Skin: Negative for rash and wound.  Neurological: Negative for seizures, syncope and headaches.  Hematological: Negative for adenopathy. Does not bruise/bleed easily.  Psychiatric/Behavioral: Negative for confusion.    Blood pressure 132/78, pulse 72, resp. rate 14, height 5' 1.5" (1.562 m), weight 288 lb 12.8 oz (130.999 kg).  Physical  Exam Physical Exam WDWN in NAD HEENT:  EOMI, sclera anicteric Neck:  No masses, no thyromegaly Lungs:  CTA bilaterally; normal respiratory effort CV:  Regular rate and rhythm; no murmurs Abd:  +bowel sounds, obese; minimal RUQ tenderness; no palpable masses Ext:  Well-perfused; no edema Skin:  Warm, dry; no sign of jaundice  Data Reviewed CBC, LFT's, Lipase WNL  *RADIOLOGY REPORT*  Clinical Data: Right upper quadrant pain  COMPLETE ABDOMINAL ULTRASOUND  Comparison: None.  Findings:  Gallbladder: Decompressed with increased echogenicity within  consistent with cholelithiasis and contracted gallbladder.  Common bile duct: 3.6 mm.  Liver: No focal lesion identified. Within normal limits in  parenchymal echogenicity.  IVC: Appears normal.  Pancreas: Somewhat obscured by overlying bowel gas.  Spleen: 9.3 cm. No mass lesion is noted.  Right Kidney: 11 cm in length. No mass lesion or hydronephrosis  is noted.  Left Kidney: 10.8 cm in length. No mass lesion or hydronephrosis  is noted.  Abdominal aorta: No aneurysm identified.  IMPRESSION:  Contracted gallbladder with gallstones within. No other focal  abnormality is noted.  Original Report Authenticated By: Alcide Clever, M.D.  Assessment    Chronic calculus cholecystitis     Plan    Laparoscopic cholecystectomy with intraoperative cholangiogram. The surgical procedure has been discussed with the patient.  Potential risks, benefits, alternative treatments, and expected outcomes have been explained.  All of the patient's questions at this time have been answered.  The likelihood of reaching the patient's treatment goal is good.  The patient understand the proposed surgical procedure and wishes to proceed.         Cynthia [name] K. 03/30/2013, 11:42 AM

## 2013-04-03 ENCOUNTER — Telehealth (INDEPENDENT_AMBULATORY_CARE_PROVIDER_SITE_OTHER): Payer: Self-pay

## 2013-04-03 NOTE — Telephone Encounter (Signed)
Pt calling b/c she has been having some left back pain and she is scheduled for a lap chole 04/09/13 by Dr Corliss Skains. The pt was diagnosed a week a go with a UTI and she has been taking Cipro 500mg  BID prescribed by Dr Ihor Dow. The pt wanted to know if the pain was related to her gallbladder or the UTI. I advised pt that most of the time the pain is always on the right side for gallbladder. The pt did say she thinks the left pain is more in her back near her kidney now. I advised pt that she needs to call Dr Nnodi's office and see about seeing them today for a repeat UA. The pt doesn't know if they did a urine culture on the pt at the time of her last UA so she will ask about this too. If any questions please call.

## 2013-04-03 NOTE — Telephone Encounter (Signed)
Pt called back to notify Dr Corliss Skains that she did see her pcp today who prescribed Flexeril for back spasms. The pt just wanted to make sure that this would not interfere with her surgery next week. I advised pt that this would not be a problem but I still would notify Dr Corliss Skains. The pt understands.

## 2013-04-09 ENCOUNTER — Other Ambulatory Visit (INDEPENDENT_AMBULATORY_CARE_PROVIDER_SITE_OTHER): Payer: Self-pay | Admitting: *Deleted

## 2013-04-09 ENCOUNTER — Other Ambulatory Visit (INDEPENDENT_AMBULATORY_CARE_PROVIDER_SITE_OTHER): Payer: Self-pay | Admitting: Surgery

## 2013-04-09 DIAGNOSIS — K824 Cholesterolosis of gallbladder: Secondary | ICD-10-CM

## 2013-04-09 DIAGNOSIS — K8010 Calculus of gallbladder with chronic cholecystitis without obstruction: Secondary | ICD-10-CM

## 2013-04-09 MED ORDER — OXYCODONE-ACETAMINOPHEN 5-325 MG PO TABS: 1.0000 | ORAL_TABLET | ORAL | Status: AC | PRN

## 2013-04-10 ENCOUNTER — Telehealth (INDEPENDENT_AMBULATORY_CARE_PROVIDER_SITE_OTHER): Payer: Self-pay | Admitting: General Surgery

## 2013-04-10 NOTE — Telephone Encounter (Signed)
Called patient this morning and she stated that she is feeling a lot better this morning, better then she did last night. She did up her percocet to two. And I told her to drink a lot of water and move around and if she needs to place heat or cold to the surgery site

## 2013-04-14 ENCOUNTER — Telehealth (INDEPENDENT_AMBULATORY_CARE_PROVIDER_SITE_OTHER): Payer: Self-pay

## 2013-04-14 DIAGNOSIS — G8918 Other acute postprocedural pain: Secondary | ICD-10-CM

## 2013-04-14 MED ORDER — HYDROCODONE-ACETAMINOPHEN 5-325 MG PO TABS: 1.0000 | ORAL_TABLET | Freq: Four times a day (QID) | ORAL | Status: DC | PRN

## 2013-04-14 NOTE — Telephone Encounter (Signed)
Patient called in requesting refill of pain medicine s/p lap chole on 9/18. Protocol norco called into [facility] on [address].

## 2013-04-28 ENCOUNTER — Encounter (INDEPENDENT_AMBULATORY_CARE_PROVIDER_SITE_OTHER): Payer: Self-pay | Admitting: Surgery

## 2013-04-28 ENCOUNTER — Ambulatory Visit (INDEPENDENT_AMBULATORY_CARE_PROVIDER_SITE_OTHER): Payer: BC Managed Care – PPO | Admitting: Surgery

## 2013-04-28 VITALS — BP 125/62 | HR 72 | Temp 98.0°F | Resp 16 | Ht 62.0 in | Wt 286.2 lb

## 2013-04-28 DIAGNOSIS — K8010 Calculus of gallbladder with chronic cholecystitis without obstruction: Secondary | ICD-10-CM

## 2013-04-28 NOTE — Progress Notes (Signed)
That is post laparoscopic cholecystectomy on 04/09/13 for chronic calculus cholecystitis. The patient is doing well. Appetite has returned to normal. She does have some soft bowel movements but denies any watery diarrhea. There has been some unexpected stress in her family recently as her grandmother passed away unexpectedly. She denies any abdominal pain or bloating.  Her incisions are well-healed with no sign of infection. No abdominal tenderness. She may resume full duty. Followup as needed.  Wilmon Arms. Corliss Skains, MD, [facility] Surgery  General/ Trauma Surgery  04/28/2013 11:32 AM

## 2014-11-26 IMAGING — US US ABDOMEN COMPLETE
1 series · 14 of 25 positions shown · non-contrast
Comparison: None.

CLINICAL DATA: Right upper quadrant pain

COMPLETE ABDOMINAL ULTRASOUND

[Series 1: us abdomen complete · 0.29mm/px · 14 of 67 slices shown]
[im 1/67]
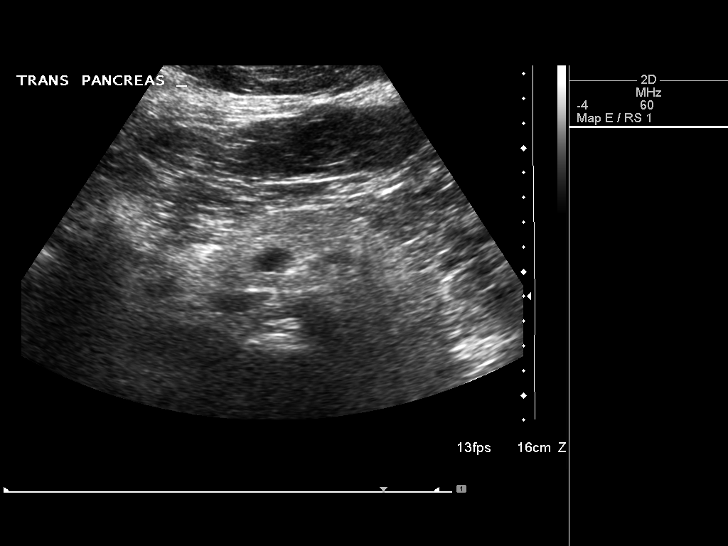
[im 6/67]
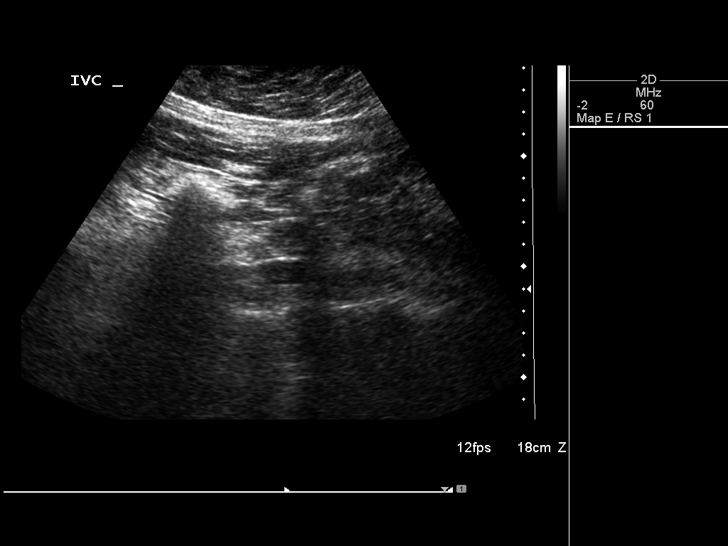
[im 12/67]
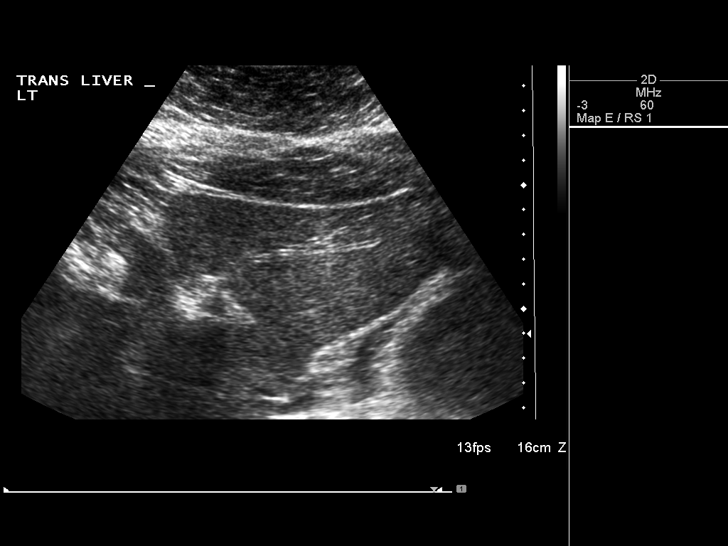
[im 17/67]
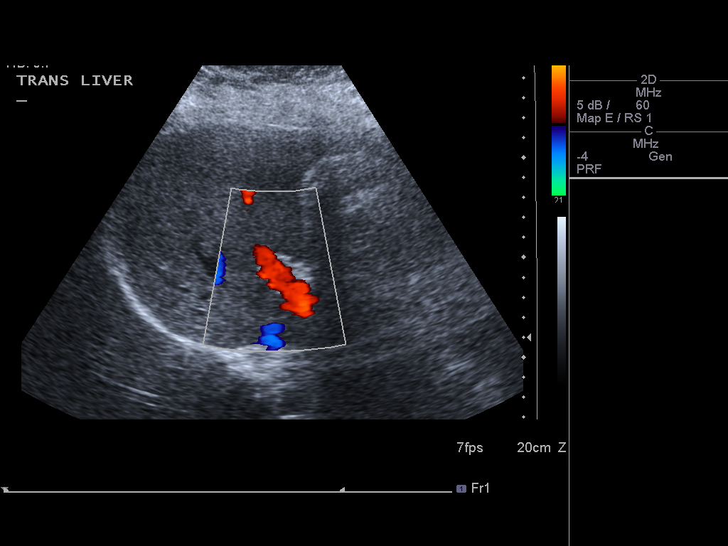
[im 23/67]
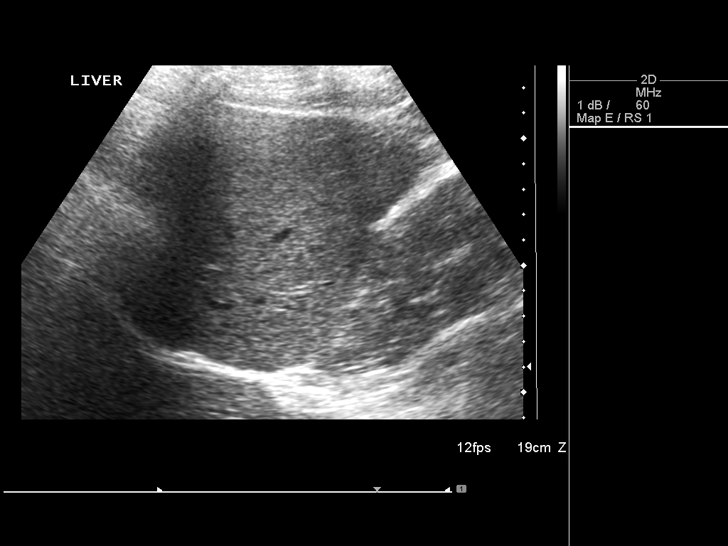
[im 25/67]
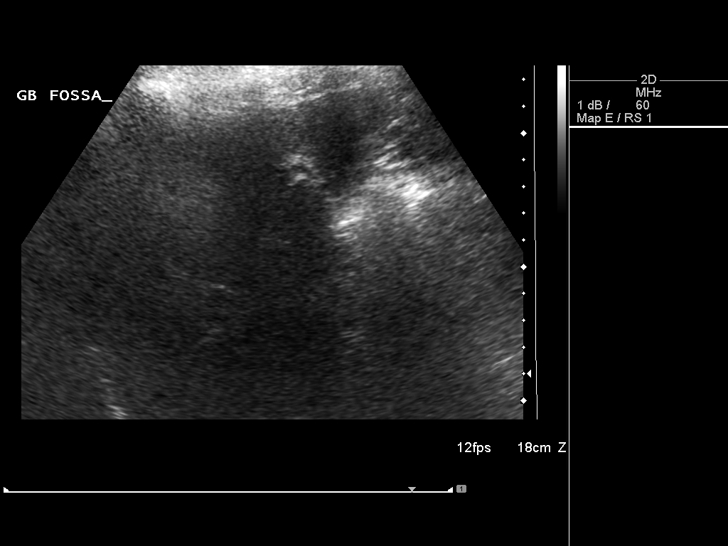
[im 31/67]
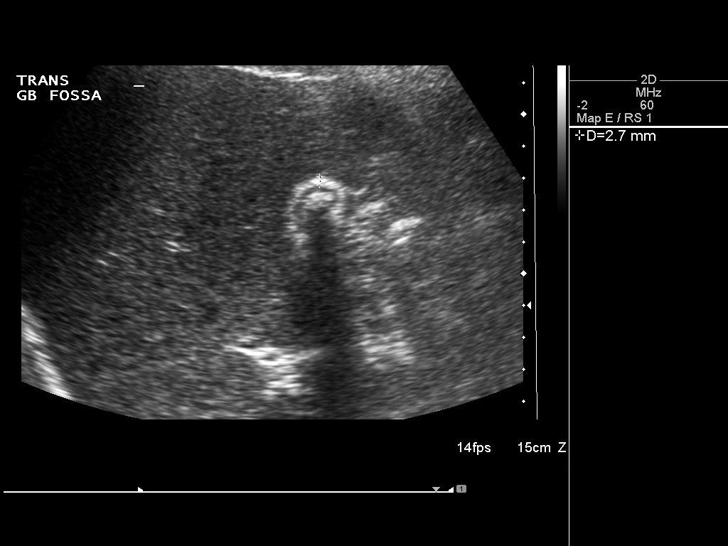
[im 36/67]
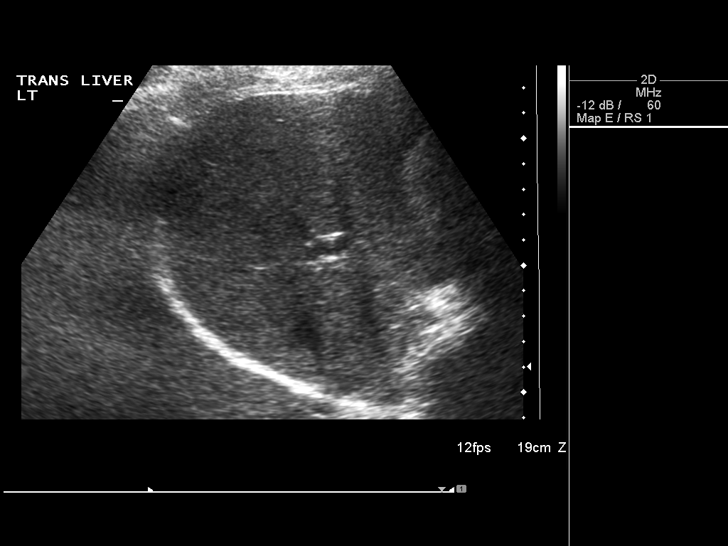
[im 42/67]
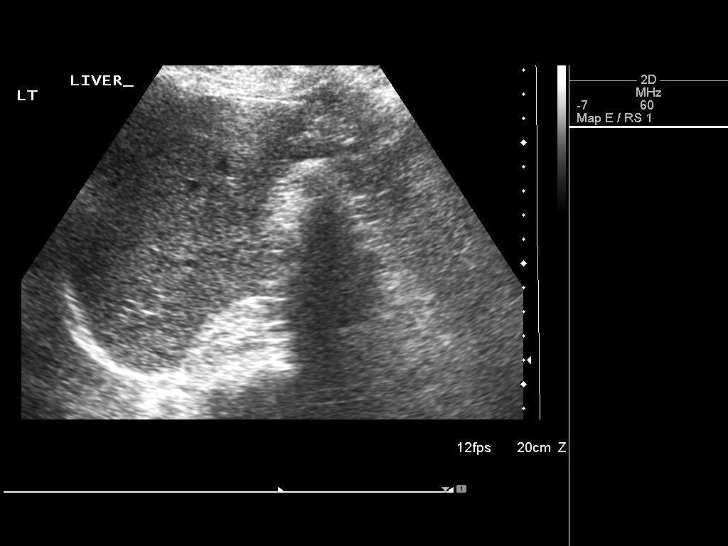
[im 45/67]
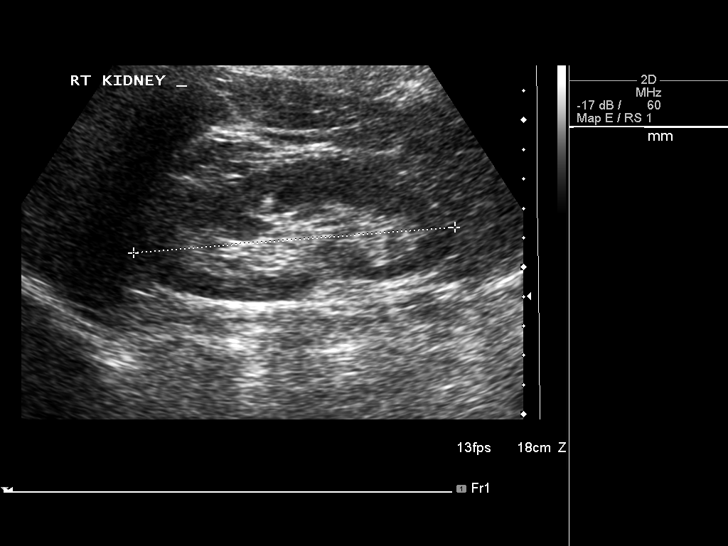
[im 50/67]
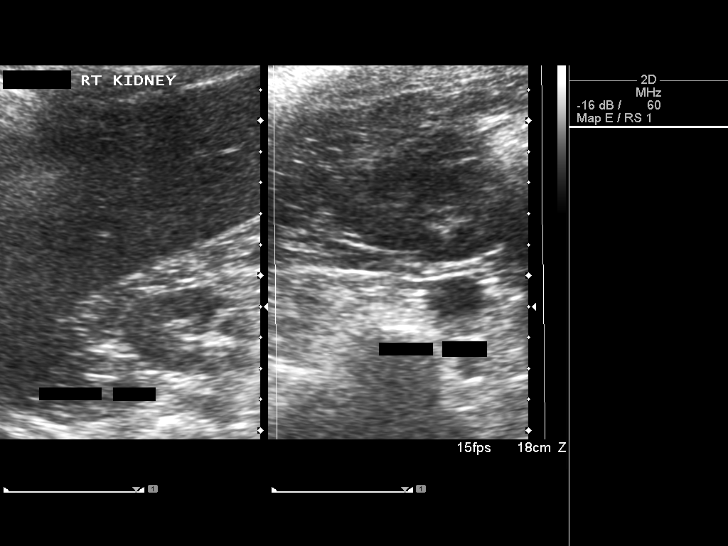
[im 56/67]
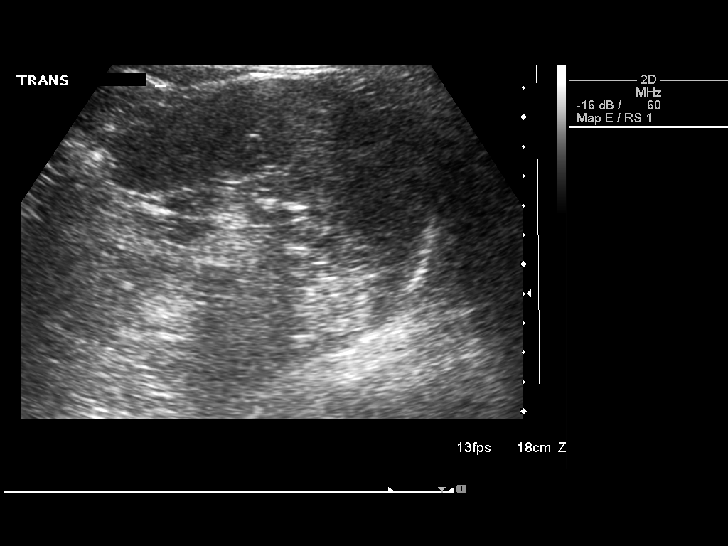
[im 61/67]
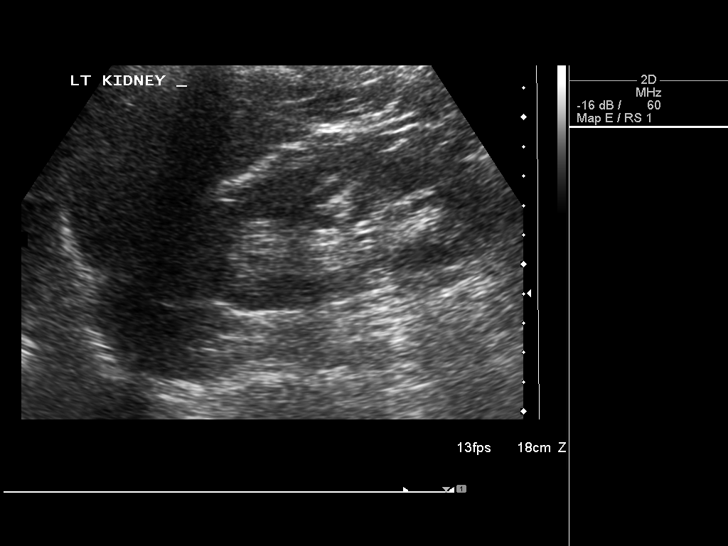
[im 67/67]
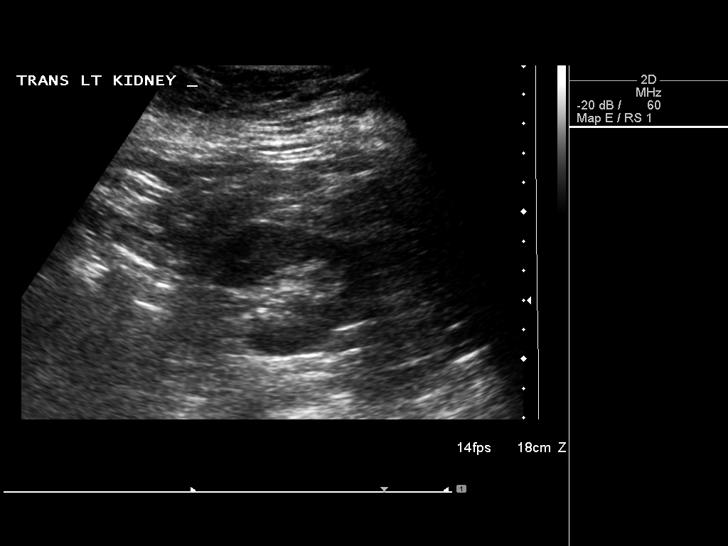

[14 of 25 positions shown; findings below may reference images not displayed]

FINDINGS: Gallbladder:  Decompressed with increased echogenicity within
consistent with cholelithiasis and contracted gallbladder.

Common bile duct:  3.6 mm.

Liver:  No focal lesion identified.  Within normal limits in
parenchymal echogenicity.

IVC:  Appears normal.

Pancreas:  Somewhat obscured by overlying bowel gas.

Spleen:  9.3 cm.  No mass lesion is noted.

Right Kidney:  11 cm in length.  No mass lesion or hydronephrosis
is noted.

Left Kidney:  10.8 cm in length.  No mass lesion or hydronephrosis
is noted.

Abdominal aorta:  No aneurysm identified.
IMPRESSION: Contracted gallbladder with gallstones within.  No other focal
abnormality is noted.

## 2015-08-04 ENCOUNTER — Ambulatory Visit (INDEPENDENT_AMBULATORY_CARE_PROVIDER_SITE_OTHER): Payer: BLUE CROSS/BLUE SHIELD | Admitting: Women's Health

## 2015-08-04 ENCOUNTER — Other Ambulatory Visit (HOSPITAL_COMMUNITY)
Admission: RE | Admit: 2015-08-04 | Discharge: 2015-08-04 | Disposition: A | Discharge status: Home / Self Care | Payer: BLUE CROSS/BLUE SHIELD | Source: Ambulatory Visit | Attending: Gynecology | Admitting: Gynecology

## 2015-08-04 ENCOUNTER — Encounter: Payer: Self-pay | Admitting: Women's Health

## 2015-08-04 VITALS — BP 126/80 | Ht 62.0 in | Wt 265.0 lb

## 2015-08-04 DIAGNOSIS — Z01419 Encounter for gynecological examination (general) (routine) without abnormal findings: Secondary | ICD-10-CM

## 2015-08-04 DIAGNOSIS — Z3041 Encounter for surveillance of contraceptive pills: Secondary | ICD-10-CM | POA: Diagnosis not present

## 2015-08-04 MED ORDER — NORETHINDRONE ACET-ETHINYL EST 1-20 MG-MCG PO TABS: 1.0000 | ORAL_TABLET | Freq: Every day | ORAL | Status: DC

## 2015-08-04 NOTE — Progress Notes (Signed)
Simonne MaffucciSarah C Rames June 23, 1986 [id number]    History:    Presents for annual exam.  Light monthly cycle on Loestrin with good relief of menorrhagia and dysmenorrhea. Has not been in the office for over 3 years primary care had renewed Loestrin. Virgin. Gardasil series completed. History of normal Pap.  Past medical history, past surgical history, family history and social history were all reviewed and documented in the EPIC chart. Working in [employer]. History of fibromyalgia, joint pain, asthma and ADHD. Cholecystectomy 2014.  ROS:  A ROS was performed and pertinent positives and negatives are included.  Exam:  Filed Vitals:   08/04/15 1501  BP: 126/80    General appearance:  Normal Thyroid:  Symmetrical, normal in size, without palpable masses or nodularity. Respiratory  Auscultation:  Clear without wheezing or rhonchi Cardiovascular  Auscultation:  Regular rate, without rubs, murmurs or gallops  Edema/varicosities:  Not grossly evident Abdominal  Soft,nontender, without masses, guarding or rebound.  Liver/spleen:  No organomegaly noted  Hernia:  None appreciated  Skin  Inspection:  Grossly normal   Breasts: Examined lying and sitting.     Right: Without masses, retractions, discharge or axillary adenopathy.     Left: Without masses, retractions, discharge or axillary adenopathy. Gentitourinary   Inguinal/mons:  Normal without inguinal adenopathy  External genitalia:  Normal  BUS/Urethra/Skene's glands:  Normal  Vagina:  Normal  Cervix:  Normal  Uterus:   normal in size, shape and contour.  Midline and mobile limited exam obesity  Adnexa/parametria:     Rt: Without masses or tenderness.   Lt: Without masses or tenderness.  Anus and perineum: Normal  Digital rectal exam: Normal sphincter tone without palpated masses or tenderness  Assessment/Plan:  30 y.o. S [place] virgin  for annual exam.   Light monthly cycle on Loestrin Morbid obesity Asthma/fibromyalgia-Primary care  managing labs  Plan: Options reviewed will continue Loestrin 1/20 prescription, proper use, risk for blood clots, strokes, reviewed. Condoms encouraged if sexually active. Reviewed importance of increasing exercise and decreasing calories for weight loss. UA, Pap.    Harrington ChallengerYOUNG,Kristan Brummitt J [facility] FacilityWHNP, 5:15 PM 08/04/2015

## 2015-08-04 NOTE — Patient Instructions (Signed)
Health Maintenance, Female Adopting a healthy lifestyle and getting preventive care can go a long way to promote health and wellness. Talk with your health care provider about what schedule of regular examinations is right for you. This is a good chance for you to check in with your provider about disease prevention and staying healthy. In between checkups, there are plenty of things you can do on your own. Experts have done a lot of research about which lifestyle changes and preventive measures are most likely to keep you healthy. Ask your health care provider for more information. WEIGHT AND DIET  Eat a healthy diet  Be sure to include plenty of vegetables, fruits, low-fat dairy products, and lean protein.  Do not eat a lot of foods high in solid fats, added sugars, or salt.  Get regular exercise. This is one of the most important things you can do for your health.  Most adults should exercise for at least 150 minutes each week. The exercise should increase your heart rate and make you sweat (moderate-intensity exercise).  Most adults should also do strengthening exercises at least twice a week. This is in addition to the moderate-intensity exercise.  Maintain a healthy weight  Body mass index (BMI) is a measurement that can be used to identify possible weight problems. It estimates body fat based on height and weight. Your health care provider can help determine your BMI and help you achieve or maintain a healthy weight.  For females 20 years of age and older:   A BMI below 18.5 is considered underweight.  A BMI of 18.5 to 24.9 is normal.  A BMI of 25 to 29.9 is considered overweight.  A BMI of 30 and above is considered obese.  Watch levels of cholesterol and blood lipids  You should start having your blood tested for lipids and cholesterol at 30 years of age, then have this test every 5 years.  You may need to have your cholesterol levels checked more often if:  Your lipid  or cholesterol levels are high.  You are older than 30 years of age.  You are at high risk for heart disease.  CANCER SCREENING   Lung Cancer  Lung cancer screening is recommended for adults 55-80 years old who are at high risk for lung cancer because of a history of smoking.  A yearly low-dose CT scan of the lungs is recommended for people who:  Currently smoke.  Have quit within the past 15 years.  Have at least a 30-pack-year history of smoking. A pack year is smoking an average of one pack of cigarettes a day for 1 year.  Yearly screening should continue until it has been 15 years since you quit.  Yearly screening should stop if you develop a health problem that would prevent you from having lung cancer treatment.  Breast Cancer  Practice breast self-awareness. This means understanding how your breasts normally appear and feel.  It also means doing regular breast self-exams. Let your health care provider know about any changes, no matter how small.  If you are in your 20s or 30s, you should have a clinical breast exam (CBE) by a health care provider every 1-3 years as part of a regular health exam.  If you are 40 or older, have a CBE every year. Also consider having a breast X-ray (mammogram) every year.  If you have a family history of breast cancer, talk to your health care provider about genetic screening.  If you   are at high risk for breast cancer, talk to your health care provider about having an MRI and a mammogram every year.  Breast cancer gene (BRCA) assessment is recommended for women who have family members with BRCA-related cancers. BRCA-related cancers include:  Breast.  Ovarian.  Tubal.  Peritoneal cancers.  Results of the assessment will determine the need for genetic counseling and BRCA1 and BRCA2 testing. Cervical Cancer Your health care provider may recommend that you be screened regularly for cancer of the pelvic organs (ovaries, uterus, and  vagina). This screening involves a pelvic examination, including checking for microscopic changes to the surface of your cervix (Pap test). You may be encouraged to have this screening done every 3 years, beginning at age 21.  For women ages 30-65, health care providers may recommend pelvic exams and Pap testing every 3 years, or they may recommend the Pap and pelvic exam, combined with testing for human papilloma virus (HPV), every 5 years. Some types of HPV increase your risk of cervical cancer. Testing for HPV may also be done on women of any age with unclear Pap test results.  Other health care providers may not recommend any screening for nonpregnant women who are considered low risk for pelvic cancer and who do not have symptoms. Ask your health care provider if a screening pelvic exam is right for you.  If you have had past treatment for cervical cancer or a condition that could lead to cancer, you need Pap tests and screening for cancer for at least 20 years after your treatment. If Pap tests have been discontinued, your risk factors (such as having a new sexual partner) need to be reassessed to determine if screening should resume. Some women have medical problems that increase the chance of getting cervical cancer. In these cases, your health care provider may recommend more frequent screening and Pap tests. Colorectal Cancer  This type of cancer can be detected and often prevented.  Routine colorectal cancer screening usually begins at 30 years of age and continues through 30 years of age.  Your health care provider may recommend screening at an earlier age if you have risk factors for colon cancer.  Your health care provider may also recommend using home test kits to check for hidden blood in the stool.  A small camera at the end of a tube can be used to examine your colon directly (sigmoidoscopy or colonoscopy). This is done to check for the earliest forms of colorectal  cancer.  Routine screening usually begins at age 50.  Direct examination of the colon should be repeated every 5-10 years through 30 years of age. However, you may need to be screened more often if early forms of precancerous polyps or small growths are found. Skin Cancer  Check your skin from head to toe regularly.  Tell your health care provider about any new moles or changes in moles, especially if there is a change in a mole's shape or color.  Also tell your health care provider if you have a mole that is larger than the size of a pencil eraser.  Always use sunscreen. Apply sunscreen liberally and repeatedly throughout the day.  Protect yourself by wearing long sleeves, pants, a wide-brimmed hat, and sunglasses whenever you are outside. HEART DISEASE, DIABETES, AND HIGH BLOOD PRESSURE   High blood pressure causes heart disease and increases the risk of stroke. High blood pressure is more likely to develop in:  People who have blood pressure in the high end   of the normal range (130-139/85-89 mm Hg).  People who are overweight or obese.  People who are African American.  If you are 38-23 years of age, have your blood pressure checked every 3-5 years. If you are 61 years of age or older, have your blood pressure checked every year. You should have your blood pressure measured twice--once when you are at a hospital or clinic, and once when you are not at a hospital or clinic. Record the average of the two measurements. To check your blood pressure when you are not at a hospital or clinic, you can use:  An automated blood pressure machine at a pharmacy.  A home blood pressure monitor.  If you are between 45 years and 39 years old, ask your health care provider if you should take aspirin to prevent strokes.  Have regular diabetes screenings. This involves taking a blood sample to check your fasting blood sugar level.  If you are at a normal weight and have a low risk for diabetes,  have this test once every three years after 30 years of age.  If you are overweight and have a high risk for diabetes, consider being tested at a younger age or more often. PREVENTING INFECTION  Hepatitis B  If you have a higher risk for hepatitis B, you should be screened for this virus. You are considered at high risk for hepatitis B if:  You were born in a country where hepatitis B is common. Ask your health care provider which countries are considered high risk.  Your parents were born in a high-risk country, and you have not been immunized against hepatitis B (hepatitis B vaccine).  You have HIV or AIDS.  You use needles to inject street drugs.  You live with someone who has hepatitis B.  You have had sex with someone who has hepatitis B.  You get hemodialysis treatment.  You take certain medicines for conditions, including cancer, organ transplantation, and autoimmune conditions. Hepatitis C  Blood testing is recommended for:  Everyone born from [birth year] through 1965.  Anyone with known risk factors for hepatitis C. Sexually transmitted infections (STIs)  You should be screened for sexually transmitted infections (STIs) including gonorrhea and chlamydia if:  You are sexually active and are younger than 30 years of age.  You are older than 30 years of age and your health care provider tells you that you are at risk for this type of infection.  Your sexual activity has changed since you were last screened and you are at an increased risk for chlamydia or gonorrhea. Ask your health care provider if you are at risk.  If you do not have HIV, but are at risk, it may be recommended that you take a prescription medicine daily to prevent HIV infection. This is called pre-exposure prophylaxis (PrEP). You are considered at risk if:  You are sexually active and do not regularly use condoms or know the HIV status of your partner(s).  You take drugs by injection.  You are sexually  active with a partner who has HIV. Talk with your health care provider about whether you are at high risk of being infected with HIV. If you choose to begin PrEP, you should first be tested for HIV. You should then be tested every 3 months for as long as you are taking PrEP.  PREGNANCY   If you are premenopausal and you may become pregnant, ask your health care provider about preconception counseling.  If you may  become pregnant, take 400 to 800 micrograms (mcg) of folic acid every day.  If you want to prevent pregnancy, talk to your health care provider about birth control (contraception). OSTEOPOROSIS AND MENOPAUSE   Osteoporosis is a disease in which the bones lose minerals and strength with aging. This can result in serious bone fractures. Your risk for osteoporosis can be identified using a bone density scan.  If you are 42 years of age or older, or if you are at risk for osteoporosis and fractures, ask your health care provider if you should be screened.  Ask your health care provider whether you should take a calcium or vitamin D supplement to lower your risk for osteoporosis.  Menopause may have certain physical symptoms and risks.  Hormone replacement therapy may reduce some of these symptoms and risks. Talk to your health care provider about whether hormone replacement therapy is right for you.  HOME CARE INSTRUCTIONS   Schedule regular health, dental, and eye exams.  Stay current with your immunizations.   Do not use any tobacco products including cigarettes, chewing tobacco, or electronic cigarettes.  If you are pregnant, do not drink alcohol.  If you are breastfeeding, limit how much and how often you drink alcohol.  Limit alcohol intake to no more than 1 drink per day for nonpregnant women. One drink equals 12 ounces of beer, 5 ounces of wine, or 1 ounces of hard liquor.  Do not use street drugs.  Do not share needles.  Ask your health care provider for help if  you need support or information about quitting drugs.  Tell your health care provider if you often feel depressed.  Tell your health care provider if you have ever been abused or do not feel safe at home.   This information is not intended to replace advice given to you by your health care provider. Make sure you discuss any questions you have with your health care provider.   Document Released: 01/22/2011 Document Revised: 07/30/2014 Document Reviewed: 06/10/2013 Elsevier Interactive Patient Education 2016 [place] Carbohydrate Counting for Diabetes Mellitus Carbohydrate counting is a method for keeping track of the amount of carbohydrates you eat. Eating carbohydrates naturally increases the level of sugar (glucose) in your blood, so it is important for you to know the amount that is okay for you to have in every meal. Carbohydrate counting helps keep the level of glucose in your blood within normal limits. The amount of carbohydrates allowed is different for every person. A dietitian can help you calculate the amount that is right for you. Once you know the amount of carbohydrates you can have, you can count the carbohydrates in the foods you want to eat. Carbohydrates are found in the following foods:  Grains, such as breads and cereals.  Dried beans and soy products.  Starchy vegetables, such as potatoes, peas, and corn.  Fruit and fruit juices.  Milk and yogurt.  Sweets and snack foods, such as cake, cookies, candy, chips, soft drinks, and fruit drinks. CARBOHYDRATE COUNTING There are two ways to count the carbohydrates in your food. You can use either of the methods or a combination of both. Reading the "Nutrition Facts" on [place] The "Nutrition Facts" is an area that is included on the labels of almost all packaged food and beverages in the [place]. It includes the serving size of that food or beverage and information about the nutrients in each serving of  the food, including the grams (g)  of carbohydrate per serving.  Decide the number of servings of this food or beverage that you will be able to eat or drink. Multiply that number of servings by the number of grams of carbohydrate that is listed on the label for that serving. The total will be the amount of carbohydrates you will be having when you eat or drink this food or beverage. Learning Standard Serving Sizes of Food When you eat food that is not packaged or does not include "Nutrition Facts" on the label, you need to measure the servings in order to count the amount of carbohydrates.A serving of most carbohydrate-rich foods contains about 15 g of carbohydrates. The following list includes serving sizes of carbohydrate-rich foods that provide 15 g ofcarbohydrate per serving:   1 slice of bread (1 oz) or 1 six-inch tortilla.    of a hamburger bun or English muffin.  4-6 crackers.   cup unsweetened dry cereal.    cup hot cereal.   cup rice or pasta.    cup mashed potatoes or  of a large baked potato.  1 cup fresh fruit or one small piece of fruit.    cup canned or frozen fruit or fruit juice.  1 cup milk.   cup plain fat-free yogurt or yogurt sweetened with artificial sweeteners.   cup cooked dried beans or starchy vegetable, such as peas, corn, or potatoes.  Decide the number of standard-size servings that you will eat. Multiply that number of servings by 15 (the grams of carbohydrates in that serving). For example, if you eat 2 cups of strawberries, you will have eaten 2 servings and 30 g of carbohydrates (2 servings x 15 g = 30 g). For foods such as soups and casseroles, in which more than one food is mixed in, you will need to count the carbohydrates in each food that is included. EXAMPLE OF CARBOHYDRATE COUNTING Sample Dinner  3 oz chicken breast.   cup of brown rice.   cup of corn.  1 cup milk.   1 cup strawberries with sugar-free whipped topping.   Carbohydrate Calculation Step 1: Identify the foods that contain carbohydrates:   Rice.   Corn.   Milk.   Strawberries. Step 2:Calculate the number of servings eaten of each:   2 servings of rice.   1 serving of corn.   1 serving of milk.   1 serving of strawberries. Step 3: Multiply each of those number of servings by 15 g:   2 servings of rice x 15 g = 30 g.   1 serving of corn x 15 g = 15 g.   1 serving of milk x 15 g = 15 g.   1 serving of strawberries x 15 g = 15 g. Step 4: Add together all of the amounts to find the total grams of carbohydrates eaten: 30 g + 15 g + 15 g + 15 g = 75 g.   This information is not intended to replace advice given to you by your health care provider. Make sure you discuss any questions you have with your health care provider.   Document Released: 07/09/2005 Document Revised: 07/30/2014 Document Reviewed: 06/05/2013 Elsevier Interactive Patient Education [employer].

## 2015-08-05 LAB — URINALYSIS W MICROSCOPIC + REFLEX CULTURE
Bilirubin Urine: NEGATIVE
Casts: NONE SEEN [LPF]
Crystals: NONE SEEN [HPF]
Glucose, UA: NEGATIVE
Hgb urine dipstick: NEGATIVE
Ketones, ur: NEGATIVE
Leukocytes, UA: NEGATIVE
Nitrite: NEGATIVE
Protein, ur: NEGATIVE
Specific Gravity, Urine: 1.023 (ref 1.001–1.035)
Yeast: NONE SEEN [HPF]
pH: 5.5 (ref 5.0–8.0)

## 2015-08-06 LAB — URINE CULTURE: Colony Count: >=100000

## 2015-08-09 LAB — CYTOLOGY - PAP

## 2015-08-10 ENCOUNTER — Telehealth: Payer: Self-pay | Admitting: *Deleted

## 2015-08-10 MED ORDER — NORETHIN ACE-ETH ESTRAD-FE 1-20 MG-MCG PO TABS: 1.0000 | ORAL_TABLET | Freq: Every day | ORAL | Status: DC

## 2015-08-10 NOTE — Telephone Encounter (Signed)
Pt aware Rx sent.  

## 2015-08-10 NOTE — Telephone Encounter (Signed)
Pt aware of normal pap as well

## 2015-08-10 NOTE — Telephone Encounter (Signed)
Okay to change to Loestrin Fe, review pills are the same but there is iron in the placebo week.

## 2015-08-10 NOTE — Telephone Encounter (Signed)
Pt was seen as new patient on 08/04/15 and Rx for birth control pills Microgestrin 1/20 was sent, but patient says she takes Microgestrin FE 1/20 with iron, would like Rx switched to this. Okay?

## 2016-05-11 DIAGNOSIS — J4530 Mild persistent asthma, uncomplicated: Secondary | ICD-10-CM | POA: Diagnosis not present

## 2016-05-11 DIAGNOSIS — J301 Allergic rhinitis due to pollen: Secondary | ICD-10-CM | POA: Diagnosis not present

## 2016-05-11 DIAGNOSIS — Z23 Encounter for immunization: Secondary | ICD-10-CM | POA: Diagnosis not present

## 2016-05-11 DIAGNOSIS — H1045 Other chronic allergic conjunctivitis: Secondary | ICD-10-CM | POA: Diagnosis not present

## 2016-05-11 DIAGNOSIS — J3089 Other allergic rhinitis: Secondary | ICD-10-CM | POA: Diagnosis not present

## 2016-06-06 DIAGNOSIS — M50322 Other cervical disc degeneration at C5-C6 level: Secondary | ICD-10-CM | POA: Diagnosis not present

## 2016-06-06 DIAGNOSIS — M5136 Other intervertebral disc degeneration, lumbar region: Secondary | ICD-10-CM | POA: Diagnosis not present

## 2016-07-30 DIAGNOSIS — J45901 Unspecified asthma with (acute) exacerbation: Secondary | ICD-10-CM | POA: Diagnosis not present

## 2016-07-30 DIAGNOSIS — J209 Acute bronchitis, unspecified: Secondary | ICD-10-CM | POA: Diagnosis not present

## 2016-08-22 DIAGNOSIS — J209 Acute bronchitis, unspecified: Secondary | ICD-10-CM | POA: Diagnosis not present

## 2016-09-28 DIAGNOSIS — M542 Cervicalgia: Secondary | ICD-10-CM | POA: Diagnosis not present

## 2016-10-05 DIAGNOSIS — M50322 Other cervical disc degeneration at C5-C6 level: Secondary | ICD-10-CM | POA: Diagnosis not present

## 2016-10-05 DIAGNOSIS — M5136 Other intervertebral disc degeneration, lumbar region: Secondary | ICD-10-CM | POA: Diagnosis not present

## 2016-10-13 ENCOUNTER — Other Ambulatory Visit: Payer: Self-pay | Admitting: Women's Health

## 2016-11-06 DIAGNOSIS — M797 Fibromyalgia: Secondary | ICD-10-CM | POA: Diagnosis not present

## 2016-11-06 DIAGNOSIS — Z13220 Encounter for screening for lipoid disorders: Secondary | ICD-10-CM | POA: Diagnosis not present

## 2016-11-06 DIAGNOSIS — Z131 Encounter for screening for diabetes mellitus: Secondary | ICD-10-CM | POA: Diagnosis not present

## 2016-11-22 DIAGNOSIS — R945 Abnormal results of liver function studies: Secondary | ICD-10-CM | POA: Diagnosis not present

## 2016-11-22 DIAGNOSIS — S0991XA Unspecified injury of ear, initial encounter: Secondary | ICD-10-CM | POA: Diagnosis not present

## 2017-01-09 DIAGNOSIS — M50322 Other cervical disc degeneration at C5-C6 level: Secondary | ICD-10-CM | POA: Diagnosis not present

## 2017-01-09 DIAGNOSIS — M5136 Other intervertebral disc degeneration, lumbar region: Secondary | ICD-10-CM | POA: Diagnosis not present

## 2017-02-19 DIAGNOSIS — G8929 Other chronic pain: Secondary | ICD-10-CM | POA: Diagnosis not present

## 2017-02-19 DIAGNOSIS — M25561 Pain in right knee: Secondary | ICD-10-CM | POA: Diagnosis not present

## 2017-02-19 DIAGNOSIS — M25562 Pain in left knee: Secondary | ICD-10-CM | POA: Diagnosis not present

## 2017-03-06 DIAGNOSIS — G4700 Insomnia, unspecified: Secondary | ICD-10-CM | POA: Diagnosis not present

## 2017-03-06 DIAGNOSIS — M797 Fibromyalgia: Secondary | ICD-10-CM | POA: Diagnosis not present

## 2017-03-06 DIAGNOSIS — K589 Irritable bowel syndrome without diarrhea: Secondary | ICD-10-CM | POA: Diagnosis not present

## 2017-04-16 ENCOUNTER — Encounter (INDEPENDENT_AMBULATORY_CARE_PROVIDER_SITE_OTHER): Payer: BLUE CROSS/BLUE SHIELD

## 2017-05-01 ENCOUNTER — Ambulatory Visit (INDEPENDENT_AMBULATORY_CARE_PROVIDER_SITE_OTHER): Payer: BLUE CROSS/BLUE SHIELD | Admitting: Family Medicine

## 2017-05-01 ENCOUNTER — Encounter (INDEPENDENT_AMBULATORY_CARE_PROVIDER_SITE_OTHER): Payer: Self-pay | Admitting: Family Medicine

## 2017-05-01 VITALS — BP 116/75 | HR 88 | Temp 97.9°F | Ht 61.0 in | Wt 264.0 lb

## 2017-05-01 DIAGNOSIS — J4530 Mild persistent asthma, uncomplicated: Secondary | ICD-10-CM

## 2017-05-01 DIAGNOSIS — R5383 Other fatigue: Secondary | ICD-10-CM

## 2017-05-01 DIAGNOSIS — Z1331 Encounter for screening for depression: Secondary | ICD-10-CM | POA: Diagnosis not present

## 2017-05-01 DIAGNOSIS — R0602 Shortness of breath: Secondary | ICD-10-CM | POA: Diagnosis not present

## 2017-05-01 DIAGNOSIS — Z6841 Body Mass Index (BMI) 40.0 and over, adult: Secondary | ICD-10-CM

## 2017-05-01 DIAGNOSIS — Z0289 Encounter for other administrative examinations: Secondary | ICD-10-CM

## 2017-05-01 NOTE — Progress Notes (Signed)
.  Office: [phone]  /  Fax: [phone]   HPI:   Chief Complaint: OBESITY  Cynthia [name] (MR# [id number]) is a 31 y.o. female who presents on 05/01/2017 for obesity evaluation and treatment. Current BMI is Body mass index is 49.88 kg/m.Marland Kitchen Cynthia [name] has struggled with obesity for years and has been unsuccessful in either losing weight or maintaining long term weight loss. Cynthia [name] attended our information session and states she is currently in the action stage of change and ready to dedicate time achieving and maintaining a healthier weight.  Cynthia [name] states her family eats meals together she thinks her family will eat healthier with  her her desired weight loss is 136 lbs she has been heavy most of  her life she started gaining weight at 31 yrs old her heaviest weight ever was 299.9 lbs. she has significant food cravings issues  she snacks frequently in the evenings she skips meals frequently she is frequently drinking liquids with calories she frequently makes poor food choices she frequently eats larger portions than normal  she has binge eating behaviors she struggles with emotional eating    Fatigue Cynthia [name] feels her energy is lower than it should be. This has worsened with weight gain and has not worsened recently. Cynthia [name] admits to daytime somnolence and  admits to waking up still tired. Patient is at risk for obstructive sleep apnea. Patent has a history of symptoms of daytime fatigue and morning fatigue. Patient generally gets 5 or 6 hours of sleep per night, and states they generally have restless sleep. Snoring is present. Apneic episodes are not present. Epworth Sleepiness Score is 2  Dyspnea on exertion Cynthia [name] notes increasing shortness of breath with exercising and seems to be worsening over time with weight gain. She notes getting out of breath sooner with activity than she used to. This has not gotten worse recently. Cynthia [name] denies orthopnea.  Asthma Mild Persistent Cynthia [name] has a  history of requiring steroids for flare ups. She saw pulmonary yesterday due to a mild exacerbation and is now on a maintenance inhaler, no hospitalizations. She used Albuterol recently, approximately 2 to 3 times per day.   Depression Screen Cynthia [name]'s Food and Mood (modified PHQ-9) score was  Depression screen PHQ 2/9 05/01/2017  Decreased Interest 1  Down, Depressed, Hopeless 1  PHQ - 2 Score 2  Altered sleeping 0  Tired, decreased energy 1  Change in appetite 3  Feeling bad or failure about yourself  0  Trouble concentrating 1  Moving slowly or fidgety/restless 0  Suicidal thoughts 0  PHQ-9 Score 7  Difficult doing work/chores Somewhat difficult    ALLERGIES: Allergies  Allergen Reactions  . Penicillins     MEDICATIONS: Current Outpatient Prescriptions on File Prior to Visit  Medication Sig Dispense Refill  . desloratadine (CLARINEX) 5 MG tablet Take 1 tablet by mouth daily.  0  . hyoscyamine (LEVSIN SL) 0.125 MG SL tablet Take 1 tablet by mouth as directed.  0  . Montelukast Sodium (SINGULAIR PO) Take by mouth.      . norethindrone-ethinyl estradiol (MICROGESTIN FE 1/20) 1-20 MG-MCG tablet Take 1 tablet by mouth daily. 3 Package 4  . olopatadine (PATANOL) 0.1 % ophthalmic solution 1 drop 2 (two) times daily.    Cynthia [name] Level 80 MCG/ACT AERS as directed.  0  . traMADol (ULTRAM) 50 MG tablet as needed.  0  . traZODone (DESYREL) 100 MG tablet Take 100 mg by mouth at bedtime.      No current  facility-administered medications on file prior to visit.     PAST MEDICAL HISTORY: Past Medical History:  Diagnosis Date  . ADHD   . ADHD (attention deficit hyperactivity disorder)   . Allergic rhinitis   . Anxiety   . Asthma   . Back injury    COMPETITIVE INJURY IN HIGH SCHOOL  . Back pain   . Depression   . Fatty liver   . Fibromyalgia   . Gallbladder problem   . GERD (gastroesophageal reflux disease)   . IBS (irritable bowel syndrome)   . Joint pain   . Pneumonia     PAST  SURGICAL HISTORY: Past Surgical History:  Procedure Laterality Date  . CHOLECYSTECTOMY    . CORONARY ARTERY BYPASS GRAFT    . DG DILATION URETERS    . SHOULDER SURGERY  2002   RIGHT  . WRIST ARTHROSCOPY     Right    SOCIAL HISTORY: Social History  Substance Use Topics  . Smoking status: Never Smoker  . Smokeless tobacco: Never Used  . Alcohol use Yes    FAMILY HISTORY: Family History  Problem Relation Age of Onset  . Multiple sclerosis Mother   . Diabetes Mother   . Hyperlipidemia Mother   . Thyroid disease Mother   . Depression Mother   . Anxiety disorder Mother   . Obesity Mother   . Hypertension Father   . Diabetes Father   . Hyperlipidemia Father   . Obesity Father   . Diabetes Maternal Grandfather   . Cancer Maternal Grandfather        prostate  . Heart disease Maternal Grandfather   . Heart disease Paternal Grandfather     ROS: Review of Systems  Constitutional: Positive for malaise/fatigue.  HENT: Positive for congestion (nasal stuffiness).        Hoarseness   Eyes:       Wear Glasses Floaters  Respiratory: Positive for shortness of breath (on exertion).        Chest Tightness (asthma)  Cardiovascular: Negative for orthopnea.  Musculoskeletal: Positive for back pain.       Neck Stiffness Muscle or Joint Pain Red or Swollen Joints   Psychiatric/Behavioral: The patient has insomnia.        Stress     PHYSICAL EXAM: Blood pressure 116/75, pulse 88, temperature 97.9 F (36.6 C), temperature source Oral, height  (1.549 m), weight 264 lb (119.7 kg), last menstrual period 05/01/2017, SpO2 96 %. Body mass index is 49.88 kg/m. Physical Exam  Constitutional: She is oriented to person, place, and time. She appears well-developed and well-nourished.  Cardiovascular: Normal rate.   Pulmonary/Chest: Effort normal.  Musculoskeletal: Normal range of motion.  Neurological: She is oriented to person, place, and time.  Skin: Skin is warm and dry.    Psychiatric: She has a normal mood and affect. Her behavior is normal.  Vitals reviewed.   RECENT LABS AND TESTS: BMET    Component Value Date/Time   GLUCOSE 88 01/09/2012 0956   No results found for: HGBA1C No results found for: INSULIN CBC    Component Value Date/Time   WBC 7.8 01/09/2012 0956   RBC 5.06 01/09/2012 0956   HGB 15.0 01/09/2012 0956   HCT 45.0 01/09/2012 0956   PLT 285 01/09/2012 0956   MCV 88.9 01/09/2012 0956   MCH 29.6 01/09/2012 0956   MCHC 33.3 01/09/2012 0956   RDW 13.8 01/09/2012 0956   LYMPHSABS 1.7 01/09/2012 0956   MONOABS 0.7 01/09/2012 0956  EOSABS 0.1 01/09/2012 0956   BASOSABS 0.0 01/09/2012 0956   Iron/TIBC/Ferritin/ %Sat No results found for: IRON, TIBC, FERRITIN, IRONPCTSAT Lipid Panel     Component Value Date/Time   CHOL 175 01/09/2012 0956   TRIG 71 01/09/2012 0956   HDL 52 01/09/2012 0956   CHOLHDL 3.4 01/09/2012 0956   VLDL 14 01/09/2012 0956   LDLCALC 109 (H) 01/09/2012 0956   Hepatic Function Panel  No results found for: PROT, ALBUMIN, AST, ALT, ALKPHOS, BILITOT, BILIDIR, IBILI    Component Value Date/Time   TSH 1.850 01/09/2012 0956    ECG  shows NSR with a rate of 87 BPM INDIRECT CALORIMETER done today shows a VO2 of 196 and a REE of 1363. Her calculated basal metabolic rate is 9604 thus her basal metabolic rate is worse than expected.    ASSESSMENT AND PLAN: Other fatigue - Plan: EKG 12-Lead, Vitamin B12, CBC With Differential, Comprehensive metabolic panel, Folate, Hemoglobin A1c, Insulin, random, Lipid Panel With LDL/HDL Ratio, T3, T4, free, TSH, VITAMIN D 25 Hydroxy (Vit-D Deficiency, Fractures)  Shortness of breath on exertion - Plan: CBC With Differential, Comprehensive metabolic panel, Lipid Panel With LDL/HDL Ratio  Mild persistent asthma without complication  Depression screening  Class 3 severe obesity with serious comorbidity and body mass index (BMI) of 50.0 to 59.9 in adult, unspecified obesity  type Cynthia [name])  PLAN:  Fatigue Cynthia [name] was informed that her fatigue may be related to obesity, depression or many other causes. Labs will be ordered, and in the meanwhile Debbora has agreed to work on diet, exercise and weight loss to help with fatigue. Proper sleep hygiene was discussed including the need for 7-8 hours of quality sleep each night. A sleep study was not ordered based on symptoms and Epworth score.  Dyspnea on exertion Cynthia [name]'s shortness of breath appears to be obesity related and exercise induced. She has agreed to work on weight loss and gradually increase exercise to treat her exercise induced shortness of breath. If Precilla follows our instructions and loses weight without improvement of her shortness of breath, we will plan to refer to pulmonology. We will monitor this condition regularly. Cynthia [name] agrees to this plan.  Asthma Mild Persistent Cynthia [name] agrees to continue with medications, agree with maintenance inhaler to help prevent exacerbations requiring steroids. Cynthia [name] will work on weight loss and will follow up with our clinic in 2 weeks.  Depression Screen Cynthia [name] had a mildly positive depression screening. Depression is commonly associated with obesity and often results in emotional eating behaviors. We will monitor this closely and work on CBT to help improve the non-hunger eating patterns. Referral to Psychology may be required if no improvement is seen as she continues in our clinic.  Obesity Cynthia [name] is currently in the action stage of change and her goal is to continue with weight loss efforts She has agreed to follow the Category 2 plan Cynthia [name] has been instructed to work up to a goal of 150 minutes of combined cardio and strengthening exercise per week for weight loss and overall health benefits. We discussed the following Behavioral Modification Strategies today: increasing lean protein intake, decreasing simple carbohydrates  and work on meal planning and easy cooking  plans  Domino has agreed to follow up with our clinic in 2 weeks. She was informed of the importance of frequent follow up visits to maximize her success with intensive lifestyle modifications for her multiple health conditions. She was informed we would discuss her lab results at her next visit  unless there is a critical issue that needs to be addressed sooner. Ira agreed to keep her next visit at the agreed upon time to discuss these results.  I, [place] Crane, am acting as transcriptionist for Quillian Quince, MD  I have reviewed the above documentation for accuracy and completeness, and I agree with the above. -Quillian Quince, MD   OBESITY BEHAVIORAL INTERVENTION VISIT  Today's visit was # 1 out of 22.  Starting weight: 264 lbs Starting date: 05/01/17 Today's weight : 264 lbs Today's date: 05/01/2017 Total lbs lost to date: 0 (Patients must lose 7 lbs in the first 6 months to continue with counseling)   ASK: We discussed the diagnosis of obesity with Cynthia [name] today and Cynthia [name] agreed to give [place] permission to discuss obesity behavioral modification therapy today.  ASSESS: Atley has the diagnosis of obesity and her BMI today is 49.91 Cynthia [name] is in the action stage of change   ADVISE: Cynthia [name] was educated on the multiple health risks of obesity as well as the benefit of weight loss to improve her health. She was advised of the need for long term treatment and the importance of lifestyle modifications.  AGREE: Multiple dietary modification options and treatment options were discussed and  Sabrinia agreed to follow the Category 2 plan We discussed the following Behavioral Modification Strategies today: increasing lean protein intake, decreasing simple carbohydrates  and work on meal planning and easy cooking plans

## 2017-05-02 LAB — CBC WITH DIFFERENTIAL
Basophils Absolute: 0 10*3/uL (ref 0.0–0.2)
Basos: 0 %
EOS (ABSOLUTE): 0.1 10*3/uL (ref 0.0–0.4)
Eos: 1 %
Hematocrit: 43.3 % (ref 34.0–46.6)
Hemoglobin: 14 g/dL (ref 11.1–15.9)
Immature Grans (Abs): 0 10*3/uL (ref 0.0–0.1)
Immature Granulocytes: 0 %
Lymphocytes Absolute: 1.5 10*3/uL (ref 0.7–3.1)
Lymphs: 21 %
MCH: 30 pg (ref 26.6–33.0)
MCHC: 32.3 g/dL (ref 31.5–35.7)
MCV: 93 fL (ref 79–97)
Monocytes Absolute: 0.6 10*3/uL (ref 0.1–0.9)
Monocytes: 8 %
Neutrophils Absolute: 4.7 10*3/uL (ref 1.4–7.0)
Neutrophils: 70 %
RBC: 4.67 x10E6/uL (ref 3.77–5.28)
RDW: 13.7 % (ref 12.3–15.4)
WBC: 6.8 10*3/uL (ref 3.4–10.8)

## 2017-05-02 LAB — COMPREHENSIVE METABOLIC PANEL
ALT: 14 IU/L (ref 0–32)
AST: 15 IU/L (ref 0–40)
Albumin/Globulin Ratio: 1.6 (ref 1.2–2.2)
Albumin: 4.2 g/dL (ref 3.5–5.5)
Alkaline Phosphatase: 61 IU/L (ref 39–117)
BUN/Creatinine Ratio: 13 (ref 9–23)
BUN: 10 mg/dL (ref 6–20)
Bilirubin Total: 0.3 mg/dL (ref 0.0–1.2)
CO2: 22 mmol/L (ref 20–29)
Calcium: 9.1 mg/dL (ref 8.7–10.2)
Chloride: 104 mmol/L (ref 96–106)
Creatinine, Ser: 0.77 mg/dL (ref 0.57–1.00)
GFR calc Af Amer: 119 mL/min/{1.73_m2} (ref >59)
GFR calc non Af Amer: 103 mL/min/{1.73_m2} (ref >59)
Globulin, Total: 2.6 g/dL (ref 1.5–4.5)
Glucose: 80 mg/dL (ref 65–99)
Potassium: 4.6 mmol/L (ref 3.5–5.2)
Sodium: 139 mmol/L (ref 134–144)
Total Protein: 6.8 g/dL (ref 6.0–8.5)

## 2017-05-02 LAB — T3: T3, Total: 173 ng/dL (ref 71–180)

## 2017-05-02 LAB — VITAMIN D 25 HYDROXY (VIT D DEFICIENCY, FRACTURES): Vit D, 25-Hydroxy: 14.6 ng/mL — ABNORMAL LOW (ref 30.0–100.0)

## 2017-05-02 LAB — LIPID PANEL WITH LDL/HDL RATIO
Cholesterol, Total: 149 mg/dL (ref 100–199)
HDL: 48 mg/dL (ref >39)
LDL Calculated: 90 mg/dL (ref 0–99)
LDl/HDL Ratio: 1.9 ratio (ref 0.0–3.2)
Triglycerides: 53 mg/dL (ref 0–149)
VLDL Cholesterol Cal: 11 mg/dL (ref 5–40)

## 2017-05-02 LAB — INSULIN, RANDOM: INSULIN: 11.1 u[IU]/mL (ref 2.6–24.9)

## 2017-05-02 LAB — FOLATE: Folate: 14.2 ng/mL (ref >3.0)

## 2017-05-02 LAB — HEMOGLOBIN A1C
Est. average glucose Bld gHb Est-mCnc: 105 mg/dL
Hgb A1c MFr Bld: 5.3 % (ref 4.8–5.6)

## 2017-05-02 LAB — T4, FREE: Free T4: 1.59 ng/dL (ref 0.82–1.77)

## 2017-05-02 LAB — VITAMIN B12: Vitamin B-12: 456 pg/mL (ref 232–1245)

## 2017-05-02 LAB — TSH: TSH: 1.64 u[IU]/mL (ref 0.450–4.500)

## 2017-05-14 ENCOUNTER — Ambulatory Visit (INDEPENDENT_AMBULATORY_CARE_PROVIDER_SITE_OTHER): Payer: BLUE CROSS/BLUE SHIELD | Admitting: Family Medicine

## 2017-05-14 VITALS — BP 117/88 | HR 76 | Temp 98.1°F | Ht 61.0 in | Wt 262.0 lb

## 2017-05-14 DIAGNOSIS — E559 Vitamin D deficiency, unspecified: Secondary | ICD-10-CM

## 2017-05-14 DIAGNOSIS — Z6841 Body Mass Index (BMI) 40.0 and over, adult: Secondary | ICD-10-CM | POA: Diagnosis not present

## 2017-05-14 DIAGNOSIS — E8881 Metabolic syndrome: Secondary | ICD-10-CM

## 2017-05-14 MED ORDER — METFORMIN HCL 500 MG PO TABS: 500.0000 mg | ORAL_TABLET | Freq: Every day | ORAL | 0 refills | Status: DC

## 2017-05-14 MED ORDER — VITAMIN D (ERGOCALCIFEROL) 1.25 MG (50000 UNIT) PO CAPS: 50000.0000 [IU] | ORAL_CAPSULE | ORAL | 0 refills | Status: DC

## 2017-05-14 NOTE — Progress Notes (Signed)
Office: [phone]  /  Fax: [phone]   HPI:   Chief Complaint: OBESITY Cynthia [name] is here to discuss her progress with her obesity treatment plan. She is on the Category 2 plan and is following her eating plan approximately 90 % of the time. She states she is exercising 0 minutes 0 times per week. Cynthia [name] did well with weight loss, she didn't like the breakfast options and had mid AM hunger issues. She did well with lunch options. She had more emotional eating than she realized in the past.  Her weight is 262 lb (118.8 kg) today and has had a weight loss of 2 pounds over a period of 2 weeks since her last visit. She has lost 2 lbs since starting treatment with [place].  Vitamin D deficiency Cynthia [name] has a new diagnosis of vitamin D deficiency. She is not currently taking Vit D, she notes fatigue and denies nausea, vomiting or muscle weakness.  Insulin Resistance Cynthia [name] has a new diagnosis of insulin resistance based on her elevated fasting insulin level >5. Her glucose and A1c are normal. Although Cynthia [name]'s blood glucose readings are still under good control, insulin resistance puts her at greater risk of metabolic syndrome and diabetes. She is not taking metformin currently and continues to work on diet and exercise to decrease risk of diabetes. She notes polyphagia.  ALLERGIES: Allergies  Allergen Reactions  . Penicillins     MEDICATIONS: Current Outpatient Prescriptions on File Prior to Visit  Medication Sig Dispense Refill  . albuterol (PROAIR HFA) 108 (90 Base) MCG/ACT inhaler Inhale 1-2 puffs into the lungs every 6 (six) hours as needed for wheezing or shortness of breath.    . desloratadine (CLARINEX) 5 MG tablet Take 1 tablet by mouth daily.  0  . fluticasone furoate-vilanterol (BREO ELLIPTA) 200-25 MCG/INH AEPB Inhale 1 puff into the lungs daily.    . hyoscyamine (LEVSIN SL) 0.125 MG SL tablet Take 1 tablet by mouth as directed.  0  . ibuprofen (ADVIL,MOTRIN) 200 MG tablet Take 200 mg  by mouth every 6 (six) hours as needed.    . Montelukast Sodium (SINGULAIR PO) Take by mouth.      . norethindrone-ethinyl estradiol (MICROGESTIN FE 1/20) 1-20 MG-MCG tablet Take 1 tablet by mouth daily. 3 Package 4  . olopatadine (PATANOL) 0.1 % ophthalmic solution 1 drop 2 (two) times daily.    Illa Level 80 MCG/ACT AERS as directed.  0  . traMADol (ULTRAM) 50 MG tablet as needed.  0  . traZODone (DESYREL) 100 MG tablet Take 100 mg by mouth at bedtime.      No current facility-administered medications on file prior to visit.     PAST MEDICAL HISTORY: Past Medical History:  Diagnosis Date  . ADHD   . ADHD (attention deficit hyperactivity disorder)   . Allergic rhinitis   . Anxiety   . Asthma   . Back injury    COMPETITIVE INJURY IN HIGH SCHOOL  . Back pain   . Depression   . Fatty liver   . Fibromyalgia   . Gallbladder problem   . GERD (gastroesophageal reflux disease)   . IBS (irritable bowel syndrome)   . Joint pain   . Pneumonia     PAST SURGICAL HISTORY: Past Surgical History:  Procedure Laterality Date  . CHOLECYSTECTOMY    . CORONARY ARTERY BYPASS GRAFT    . DG DILATION URETERS    . SHOULDER SURGERY  2002   RIGHT  . WRIST ARTHROSCOPY  Right    SOCIAL HISTORY: Social History  Substance Use Topics  . Smoking status: Never Smoker  . Smokeless tobacco: Never Used  . Alcohol use Yes    FAMILY HISTORY: Family History  Problem Relation Age of Onset  . Multiple sclerosis Mother   . Diabetes Mother   . Hyperlipidemia Mother   . Thyroid disease Mother   . Depression Mother   . Anxiety disorder Mother   . Obesity Mother   . Hypertension Father   . Diabetes Father   . Hyperlipidemia Father   . Obesity Father   . Diabetes Maternal Grandfather   . Cancer Maternal Grandfather        prostate  . Heart disease Maternal Grandfather   . Heart disease Paternal Grandfather     ROS: Review of Systems  Constitutional: Positive for malaise/fatigue and weight  loss.  Gastrointestinal: Negative for nausea and vomiting.  Musculoskeletal:       Negative muscle weakness  Endo/Heme/Allergies:       Positive polyphagia    PHYSICAL EXAM: Blood pressure 117/88, pulse 76, temperature 98.1 F (36.7 C), temperature source Oral, height 5\' 1"  (1.549 m), weight 262 lb (118.8 kg), last menstrual period 05/01/2017, SpO2 98 %. Body mass index is 49.5 kg/m. Physical Exam  Constitutional: She is oriented to person, place, and time. She appears well-developed and well-nourished.  Cardiovascular: Normal rate.   Pulmonary/Chest: Effort normal.  Musculoskeletal: Normal range of motion.  Neurological: She is oriented to person, place, and time.  Skin: Skin is warm and dry.  Psychiatric: She has a normal mood and affect. Her behavior is normal.  Vitals reviewed.   RECENT LABS AND TESTS: BMET    Component Value Date/Time   NA 139 05/01/2017 1009   K 4.6 05/01/2017 1009   CL 104 05/01/2017 1009   CO2 22 05/01/2017 1009   GLUCOSE 80 05/01/2017 1009   GLUCOSE 88 01/09/2012 0956   BUN 10 05/01/2017 1009   CREATININE 0.77 05/01/2017 1009   CALCIUM 9.1 05/01/2017 1009   GFRNONAA 103 05/01/2017 1009   GFRAA 119 05/01/2017 1009   Lab Results  Component Value Date   HGBA1C 5.3 05/01/2017   Lab Results  Component Value Date   INSULIN 11.1 05/01/2017   CBC    Component Value Date/Time   WBC 6.8 05/01/2017 1009   WBC 7.8 01/09/2012 0956   RBC 4.67 05/01/2017 1009   RBC 5.06 01/09/2012 0956   HGB 14.0 05/01/2017 1009   HCT 43.3 05/01/2017 1009   PLT 285 01/09/2012 0956   MCV 93 05/01/2017 1009   MCH 30.0 05/01/2017 1009   MCH 29.6 01/09/2012 0956   MCHC 32.3 05/01/2017 1009   MCHC 33.3 01/09/2012 0956   RDW 13.7 05/01/2017 1009   LYMPHSABS 1.5 05/01/2017 1009   MONOABS 0.7 01/09/2012 0956   EOSABS 0.1 05/01/2017 1009   BASOSABS 0.0 05/01/2017 1009   Iron/TIBC/Ferritin/ %Sat No results found for: IRON, TIBC, FERRITIN, IRONPCTSAT Lipid Panel       Component Value Date/Time   CHOL 149 05/01/2017 1009   TRIG 53 05/01/2017 1009   HDL 48 05/01/2017 1009   CHOLHDL 3.4 01/09/2012 0956   VLDL 14 01/09/2012 0956   LDLCALC 90 05/01/2017 1009   Hepatic Function Panel     Component Value Date/Time   PROT 6.8 05/01/2017 1009   ALBUMIN 4.2 05/01/2017 1009   AST 15 05/01/2017 1009   ALT 14 05/01/2017 1009   ALKPHOS 61 05/01/2017 1009  BILITOT 0.3 05/01/2017 1009      Component Value Date/Time   TSH 1.640 05/01/2017 1009   TSH 1.850 01/09/2012 0956    ASSESSMENT AND PLAN: Vitamin D deficiency - Plan: Vitamin D, Ergocalciferol, (DRISDOL) 50000 units CAPS capsule  Insulin resistance - Plan: metFORMIN (GLUCOPHAGE) 500 MG tablet  Class 3 severe obesity with serious comorbidity and body mass index (BMI) of 45.0 to 49.9 in adult, unspecified obesity type (HCC)  PLAN:  Vitamin D Deficiency Cynthia [name] was informed that low vitamin D levels contributes to fatigue and are associated with obesity, breast, and colon cancer. Cynthia [name] agrees to start prescription Vit D @50 ,000 IU every week #4 with no refills. She will follow up for routine testing of vitamin D, at least 2-3 times per year. She was informed of the risk of over-replacement of vitamin D and agrees to not increase her dose unless he discusses this with [place] first. Cynthia [name] agrees to follow up with our clinic in 2 weeks.  Insulin Resistance Cynthia [name] will continue to work on weight loss, exercise, and decreasing simple carbohydrates in her diet to help decrease the risk of diabetes. We dicussed metformin including benefits and risks. She was informed that eating too many simple carbohydrates or too many calories at one sitting increases the likelihood of GI side effects. Cynthia [name] agrees to start metformin 500 mg q AM #30 with no refills. Cynthia [name] agrees to follow up with our clinic in 2 weeks as directed to monitor her progress.  Obesity Cynthia [name] is currently in the action stage of change. As such, her  goal is to continue with weight loss efforts She has agreed to change to keep a food journal with 200-350 calories and 20 grams of protein at breakfast daily and follow the Category 2 plan Cynthia [name] has been instructed to work up to a goal of 150 minutes of combined cardio and strengthening exercise per week for weight loss and overall health benefits. We discussed the following Behavioral Modification Strategies today: increasing lean protein intake, decreasing simple carbohydrates, work on meal planning and easy cooking plans, and keep a strict food journal   Cynthia [name] has agreed to follow up with our clinic in 2 weeks. She was informed of the importance of frequent follow up visits to maximize her success with intensive lifestyle modifications for her multiple health conditions.  I, Burt Knack, am acting as transcriptionist for Quillian Quince, MD  I have reviewed the above documentation for accuracy and completeness, and I agree with the above. -Quillian Quince, MD     Today's visit was # 2 out of 22.  Starting weight: 264 lbs Starting date: 05/01/17 Today's weight : 262 lbs  Today's date: 05/14/2017 Total lbs lost to date: 2 (Patients must lose 7 lbs in the first 6 months to continue with counseling)   ASK: We discussed the diagnosis of obesity with Cynthia [name] today and Cynthia [name] agreed to give [place] permission to discuss obesity behavioral modification therapy today.  ASSESS: Jazlen has the diagnosis of obesity and her BMI today is 54 Cynthia [name] is in the action stage of change   ADVISE: Cynthia [name] was educated on the multiple health risks of obesity as well as the benefit of weight loss to improve her health. She was advised of the need for long term treatment and the importance of lifestyle modifications.  AGREE: Multiple dietary modification options and treatment options were discussed and  Cynthia [name] agreed to keep a food journal with 200-350 calories and 20 grams of protein  at breakfast daily and  follow the Category 2 plan We discussed the following Behavioral Modification Strategies today: increasing lean protein intake, decreasing simple carbohydrates, work on meal planning and easy cooking plans, and keep a strict food journal.

## 2017-05-28 ENCOUNTER — Ambulatory Visit (INDEPENDENT_AMBULATORY_CARE_PROVIDER_SITE_OTHER): Payer: BLUE CROSS/BLUE SHIELD | Admitting: Family Medicine

## 2017-05-28 VITALS — BP 119/81 | HR 107 | Temp 97.7°F | Ht 61.0 in | Wt 260.0 lb

## 2017-05-28 DIAGNOSIS — Z6841 Body Mass Index (BMI) 40.0 and over, adult: Secondary | ICD-10-CM | POA: Diagnosis not present

## 2017-05-28 DIAGNOSIS — E8881 Metabolic syndrome: Secondary | ICD-10-CM | POA: Diagnosis not present

## 2017-05-28 NOTE — Progress Notes (Signed)
Office: [phone]  /  Fax: [phone]   HPI:   Chief Complaint: OBESITY Cynthia [name] is here to discuss her progress with her obesity treatment plan. She is on the keep a food journal with 200-350 calories and 20 grams of protein at breakfast daily and follow the Category 2 plan and is following her eating plan approximately 50 % of the time. She states she is walking for 15 minutes 5 times per week. Cynthia [name] continues to do well with weight loss but she notes some stress eating issues with increased work conflict.  Her weight is 260 lb (117.9 kg) today and has had a weight loss of 2 pounds over a period of 2 weeks since her last visit. She has lost 4 lbs since starting treatment with [place].  Cynthia [name] has a diagnosis of Cynthia resistance based on her elevated fasting Cynthia level >5. Although Cynthia [name]'s blood glucose readings are still under good control, Cynthia resistance puts her at greater risk of metabolic syndrome and diabetes. She has started metformin, no GI upset and she is doing well with diet prescription overall and continues to work on diet and exercise to decrease risk of diabetes.  ALLERGIES: Allergies  Allergen Reactions  . Penicillins     MEDICATIONS: Current Outpatient Medications on File Prior to Visit  Medication Sig Dispense Refill  . albuterol (PROAIR HFA) 108 (90 Base) MCG/ACT inhaler Inhale 1-2 puffs into the lungs every 6 (six) hours as needed for wheezing or shortness of breath.    . desloratadine (CLARINEX) 5 MG tablet Take 1 tablet by mouth daily.  0  . fluticasone furoate-vilanterol (BREO ELLIPTA) 200-25 MCG/INH AEPB Inhale 1 puff into the lungs daily.    . hyoscyamine (LEVSIN SL) 0.125 MG SL tablet Take 1 tablet by mouth as directed.  0  . ibuprofen (ADVIL,MOTRIN) 200 MG tablet Take 200 mg by mouth every 6 (six) hours as needed.    . metFORMIN (GLUCOPHAGE) 500 MG tablet Take 1 tablet (500 mg total) by mouth daily with breakfast. 30 tablet 0  .  Montelukast Sodium (SINGULAIR PO) Take by mouth.      . norethindrone-ethinyl estradiol (MICROGESTIN FE 1/20) 1-20 MG-MCG tablet Take 1 tablet by mouth daily. 3 Package 4  . olopatadine (PATANOL) 0.1 % ophthalmic solution 1 drop 2 (two) times daily.    Cynthia [name] as directed.  0  . traMADol (ULTRAM) 50 MG tablet as needed.  0  . traZODone (DESYREL) 100 MG tablet Take 100 mg by mouth at bedtime.     . Vitamin D, Ergocalciferol, (DRISDOL) 50000 units CAPS capsule Take 1 capsule (50,000 Units total) by mouth every 7 (seven) days. 4 capsule 0   No current facility-administered medications on file prior to visit.     PAST MEDICAL HISTORY: Past Medical History:  Diagnosis Date  . ADHD   . ADHD (attention deficit hyperactivity disorder)   . Allergic rhinitis   . Anxiety   . Asthma   . Back injury    COMPETITIVE INJURY IN HIGH SCHOOL  . Back pain   . Depression   . Fatty liver   . Fibromyalgia   . Gallbladder problem   . GERD (gastroesophageal reflux disease)   . IBS (irritable bowel syndrome)   . Joint pain   . Pneumonia     PAST SURGICAL HISTORY: Past Surgical History:  Procedure Laterality Date  . CHOLECYSTECTOMY    . CORONARY ARTERY BYPASS GRAFT    . DG  DILATION URETERS    . SHOULDER SURGERY  2002   RIGHT  . WRIST ARTHROSCOPY     Right    SOCIAL HISTORY: Social History   Tobacco Use  . Smoking status: Never Smoker  . Smokeless tobacco: Never Used  Substance Use Topics  . Alcohol use: Yes  . Drug use: No    FAMILY HISTORY: Family History  Problem Relation Age of Onset  . Multiple sclerosis Mother   . Diabetes Mother   . Hyperlipidemia Mother   . Thyroid disease Mother   . Depression Mother   . Anxiety disorder Mother   . Obesity Mother   . Hypertension Father   . Diabetes Father   . Hyperlipidemia Father   . Obesity Father   . Diabetes Maternal Grandfather   . Cancer Maternal Grandfather        prostate  . Heart disease Maternal  Grandfather   . Heart disease Paternal Grandfather     ROS: Review of Systems  Constitutional: Positive for weight loss.  Gastrointestinal:       Negative GI upset    PHYSICAL EXAM: Blood pressure 119/81, pulse (!) 107, temperature 97.7 F (36.5 C), temperature source Oral, height 5\' 1"  (1.549 m), weight 260 lb (117.9 kg), last menstrual period 05/01/2017, SpO2 98 %. Body mass index is 49.13 kg/m. Physical Exam  Constitutional: She is oriented to person, place, and time. She appears well-developed and well-nourished.  Cardiovascular: Normal rate.  Pulmonary/Chest: Effort normal.  Musculoskeletal: Normal range of motion.  Neurological: She is oriented to person, place, and time.  Skin: Skin is warm and dry.  Psychiatric: She has a normal mood and affect. Her behavior is normal.  Vitals reviewed.   RECENT LABS AND TESTS: BMET    Component Value Date/Time   NA 139 05/01/2017 1009   K 4.6 05/01/2017 1009   CL 104 05/01/2017 1009   CO2 22 05/01/2017 1009   GLUCOSE 80 05/01/2017 1009   GLUCOSE 88 01/09/2012 0956   BUN 10 05/01/2017 1009   CREATININE 0.77 05/01/2017 1009   CALCIUM 9.1 05/01/2017 1009   GFRNONAA 103 05/01/2017 1009   GFRAA 119 05/01/2017 1009   Lab Results  Component Value Date   HGBA1C 5.3 05/01/2017   Lab Results  Component Value Date   Cynthia 11.1 05/01/2017   CBC    Component Value Date/Time   WBC 6.8 05/01/2017 1009   WBC 7.8 01/09/2012 0956   RBC 4.67 05/01/2017 1009   RBC 5.06 01/09/2012 0956   HGB 14.0 05/01/2017 1009   HCT 43.3 05/01/2017 1009   PLT 285 01/09/2012 0956   MCV 93 05/01/2017 1009   MCH 30.0 05/01/2017 1009   MCH 29.6 01/09/2012 0956   MCHC 32.3 05/01/2017 1009   MCHC 33.3 01/09/2012 0956   RDW 13.7 05/01/2017 1009   LYMPHSABS 1.5 05/01/2017 1009   MONOABS 0.7 01/09/2012 0956   EOSABS 0.1 05/01/2017 1009   BASOSABS 0.0 05/01/2017 1009   Iron/TIBC/Ferritin/ %Sat No results found for: IRON, TIBC, FERRITIN,  IRONPCTSAT Lipid Panel     Component Value Date/Time   CHOL 149 05/01/2017 1009   TRIG 53 05/01/2017 1009   HDL 48 05/01/2017 1009   CHOLHDL 3.4 01/09/2012 0956   VLDL 14 01/09/2012 0956   LDLCALC 90 05/01/2017 1009   Hepatic Function Panel     Component Value Date/Time   PROT 6.8 05/01/2017 1009   ALBUMIN 4.2 05/01/2017 1009   AST 15 05/01/2017 1009  ALT 14 05/01/2017 1009   ALKPHOS 61 05/01/2017 1009   BILITOT 0.3 05/01/2017 1009      Component Value Date/Time   TSH 1.640 05/01/2017 1009   TSH 1.850 01/09/2012 0956    ASSESSMENT AND PLAN: Cynthia resistance  Class 3 severe obesity with serious comorbidity and body mass index (BMI) of 45.0 to 49.9 in adult, unspecified obesity type (HCC)  PLAN:  Cynthia Resistance Cynthia [name] will continue to work on weight loss, diet, exercise, and decreasing simple carbohydrates in her diet to help decrease the risk of diabetes. We dicussed metformin including benefits and risks. She was informed that eating too many simple carbohydrates or too many calories at one sitting increases the likelihood of GI side effects. Cynthia [name] agrees to continue taking metformin as prescribed. Cynthia [name] agrees to follow up with our clinic in 2 weeks as directed to monitor her progress.  Obesity Cynthia [name] is currently in the action stage of change. As such, her goal is to continue with weight loss efforts She has agreed to follow the Category 2 plan Cynthia [name] has been instructed to work up to a goal of 150 minutes of combined cardio and strengthening exercise per week for weight loss and overall health benefits. We discussed the following Behavioral Modification Strategies today: increasing lean protein intake, decreasing simple carbohydrates, work on meal planning and easy cooking plans, dealing with family or coworker sabotage and emotional eating strategies   Cynthia [name] has agreed to follow up with our clinic in 2 weeks. She was informed of the importance of frequent follow  up visits to maximize her success with intensive lifestyle modifications for her multiple health conditions.  I, Burt KnackSharon Jama Mcmiller, am acting as transcriptionist for Quillian Quincearen Beasley, MD  I have reviewed the above documentation for accuracy and completeness, and I agree with the above. -Quillian Quincearen Beasley, MD      Today's visit was # 3 out of 22.  Starting weight: 264 lbs Starting date: 05/01/17 Today's weight : 260 lbs  Today's date: 05/28/2017 Total lbs lost to date: 4 (Patients must lose 7 lbs in the first 6 months to continue with counseling)   ASK: We discussed the diagnosis of obesity with Cynthia [name] today and Cynthia [name] agreed to give us permission to discuss obesity behavioral modification therapy today.  ASSESS: Cynthia [name] has the diagnosis of obesity and her BMI today is 49.15 Cynthia [name] is in the action stage of change   ADVISE: Cynthia [name] was educated on the multiple health risks of obesity as well as the benefit of weight loss to improve her health. She was advised of the need for long term treatment and the importance of lifestyle modifications.  AGREE: Multiple dietary modification options and treatment options were discussed and  Cynthia [name] agreed to follow the Category 2 plan We discussed the following Behavioral Modification Strategies today: increasing lean protein intake, decreasing simple carbohydrates, work on meal planning and easy cooking plans, dealing with family or coworker sabotage and emotional eating strategies

## 2017-06-05 DIAGNOSIS — M5136 Other intervertebral disc degeneration, lumbar region: Secondary | ICD-10-CM | POA: Diagnosis not present

## 2017-06-06 DIAGNOSIS — M25561 Pain in right knee: Secondary | ICD-10-CM | POA: Diagnosis not present

## 2017-06-06 DIAGNOSIS — M7541 Impingement syndrome of right shoulder: Secondary | ICD-10-CM | POA: Diagnosis not present

## 2017-06-06 DIAGNOSIS — G8929 Other chronic pain: Secondary | ICD-10-CM | POA: Diagnosis not present

## 2017-06-06 DIAGNOSIS — M25562 Pain in left knee: Secondary | ICD-10-CM | POA: Diagnosis not present

## 2017-06-11 ENCOUNTER — Ambulatory Visit (INDEPENDENT_AMBULATORY_CARE_PROVIDER_SITE_OTHER): Payer: BLUE CROSS/BLUE SHIELD | Admitting: Family Medicine

## 2017-06-11 VITALS — BP 126/84 | HR 93 | Temp 98.3°F | Ht 61.0 in | Wt 256.0 lb

## 2017-06-11 DIAGNOSIS — R7303 Prediabetes: Secondary | ICD-10-CM | POA: Diagnosis not present

## 2017-06-11 DIAGNOSIS — Z6841 Body Mass Index (BMI) 40.0 and over, adult: Secondary | ICD-10-CM

## 2017-06-11 DIAGNOSIS — E559 Vitamin D deficiency, unspecified: Secondary | ICD-10-CM

## 2017-06-11 MED ORDER — METFORMIN HCL 500 MG PO TABS: 500.0000 mg | ORAL_TABLET | Freq: Every day | ORAL | 0 refills | Status: DC

## 2017-06-11 MED ORDER — VITAMIN D (ERGOCALCIFEROL) 1.25 MG (50000 UNIT) PO CAPS: 50000.0000 [IU] | ORAL_CAPSULE | ORAL | 0 refills | Status: DC

## 2017-06-11 NOTE — Progress Notes (Signed)
Office: [phone]  /  Fax: [phone]   HPI:   Chief Complaint: OBESITY Cynthia [name] is here to discuss her progress with her obesity treatment plan. She is on the Category 2 plan and is following her eating plan approximately 95 % of the time. She states she is exercising 0 minutes 0 times per week. Cynthia [name] continues to do well with weight loss. She is getting good support from family but struggling with demanding company.  Her weight is 256 lb (116.1 kg) today and has had a weight loss of 4 pounds over a period of 2 weeks since her last visit. She has lost 8 lbs since starting treatment with [place].  Pre-Diabetes Cynthia [name] has a diagnosis of pre-diabetes based on her elevated Hgb A1c and was informed this puts her at greater risk of developing diabetes. She is stable on metformin but still notes polyphagia in the evening especially if she misses some of her dinner. She continues to work on diet and exercise to decrease risk of diabetes. She denies nausea or hypoglycemia.  Vitamin D deficiency Cynthia [name] has a diagnosis of vitamin D deficiency. She is stable on prescription Vit D, but not yet at goal. She denies nausea, vomiting or muscle weakness.  ALLERGIES: Allergies  Allergen Reactions  . Penicillins     MEDICATIONS: Current Outpatient Medications on File Prior to Visit  Medication Sig Dispense Refill  . albuterol (PROAIR HFA) 108 (90 Base) MCG/ACT inhaler Inhale 1-2 puffs into the lungs every 6 (six) hours as needed for wheezing or shortness of breath.    . desloratadine (CLARINEX) 5 MG tablet Take 1 tablet by mouth daily.  0  . fluticasone furoate-vilanterol (BREO ELLIPTA) 200-25 MCG/INH AEPB Inhale 1 puff into the lungs daily.    . hyoscyamine (LEVSIN SL) 0.125 MG SL tablet Take 1 tablet by mouth as directed.  0  . ibuprofen (ADVIL,MOTRIN) 200 MG tablet Take 200 mg by mouth every 6 (six) hours as needed.    . Montelukast Sodium (SINGULAIR PO) Take by mouth.      . norethindrone-ethinyl  estradiol (MICROGESTIN FE 1/20) 1-20 MG-MCG tablet Take 1 tablet by mouth daily. 3 Package 4  . olopatadine (PATANOL) 0.1 % ophthalmic solution 1 drop 2 (two) times daily.    Cynthia [name] 80 MCG/ACT AERS as directed.  0  . traMADol (ULTRAM) 50 MG tablet as needed.  0  . traZODone (DESYREL) 100 MG tablet Take 100 mg by mouth at bedtime.      No current facility-administered medications on file prior to visit.     PAST MEDICAL HISTORY: Past Medical History:  Diagnosis Date  . ADHD   . ADHD (attention deficit hyperactivity disorder)   . Allergic rhinitis   . Anxiety   . Asthma   . Back injury    COMPETITIVE INJURY IN HIGH SCHOOL  . Back pain   . Depression   . Fatty liver   . Fibromyalgia   . Gallbladder problem   . GERD (gastroesophageal reflux disease)   . IBS (irritable bowel syndrome)   . Joint pain   . Pneumonia     PAST SURGICAL HISTORY: Past Surgical History:  Procedure Laterality Date  . CHOLECYSTECTOMY    . CORONARY ARTERY BYPASS GRAFT    . DG DILATION URETERS    . SHOULDER SURGERY  2002   RIGHT  . WRIST ARTHROSCOPY     Right    SOCIAL HISTORY: Social History   Tobacco Use  . Smoking status: Never  Smoker  . Smokeless tobacco: Never Used  Substance Use Topics  . Alcohol use: Yes  . Drug use: No    FAMILY HISTORY: Family History  Problem Relation Age of Onset  . Multiple sclerosis Mother   . Diabetes Mother   . Hyperlipidemia Mother   . Thyroid disease Mother   . Depression Mother   . Anxiety disorder Mother   . Obesity Mother   . Hypertension Father   . Diabetes Father   . Hyperlipidemia Father   . Obesity Father   . Diabetes Maternal Grandfather   . Cancer Maternal Grandfather        prostate  . Heart disease Maternal Grandfather   . Heart disease Paternal Grandfather     ROS: Review of Systems  Constitutional: Positive for weight loss.  Gastrointestinal: Negative for nausea and vomiting.  Musculoskeletal:       Negative muscle weakness    Endo/Heme/Allergies:       Positive polyphagia Negative hypoglycemia    PHYSICAL EXAM: Blood pressure 126/84, pulse 93, temperature 98.3 F (36.8 C), temperature source Oral, height 5\' 1"  (1.549 m), weight 256 lb (116.1 kg), last menstrual period 05/23/2017, SpO2 98 %. Body mass index is 48.37 kg/m. Physical Exam  Constitutional: She is oriented to person, place, and time. She appears well-developed and well-nourished.  Cardiovascular: Normal rate.  Pulmonary/Chest: Effort normal.  Musculoskeletal: Normal range of motion.  Neurological: She is oriented to person, place, and time.  Skin: Skin is warm and dry.  Psychiatric: She has a normal mood and affect. Her behavior is normal.  Vitals reviewed.   RECENT LABS AND TESTS: BMET    Component Value Date/Time   NA 139 05/01/2017 1009   K 4.6 05/01/2017 1009   CL 104 05/01/2017 1009   CO2 22 05/01/2017 1009   GLUCOSE 80 05/01/2017 1009   GLUCOSE 88 01/09/2012 0956   BUN 10 05/01/2017 1009   CREATININE 0.77 05/01/2017 1009   CALCIUM 9.1 05/01/2017 1009   GFRNONAA 103 05/01/2017 1009   GFRAA 119 05/01/2017 1009   Lab Results  Component Value Date   HGBA1C 5.3 05/01/2017   Lab Results  Component Value Date   INSULIN 11.1 05/01/2017   CBC    Component Value Date/Time   WBC 6.8 05/01/2017 1009   WBC 7.8 01/09/2012 0956   RBC 4.67 05/01/2017 1009   RBC 5.06 01/09/2012 0956   HGB 14.0 05/01/2017 1009   HCT 43.3 05/01/2017 1009   PLT 285 01/09/2012 0956   MCV 93 05/01/2017 1009   MCH 30.0 05/01/2017 1009   MCH 29.6 01/09/2012 0956   MCHC 32.3 05/01/2017 1009   MCHC 33.3 01/09/2012 0956   RDW 13.7 05/01/2017 1009   LYMPHSABS 1.5 05/01/2017 1009   MONOABS 0.7 01/09/2012 0956   EOSABS 0.1 05/01/2017 1009   BASOSABS 0.0 05/01/2017 1009   Iron/TIBC/Ferritin/ %Sat No results found for: IRON, TIBC, FERRITIN, IRONPCTSAT Lipid Panel     Component Value Date/Time   CHOL 149 05/01/2017 1009   TRIG 53 05/01/2017 1009    HDL 48 05/01/2017 1009   CHOLHDL 3.4 01/09/2012 0956   VLDL 14 01/09/2012 0956   LDLCALC 90 05/01/2017 1009   Hepatic Function Panel     Component Value Date/Time   PROT 6.8 05/01/2017 1009   ALBUMIN 4.2 05/01/2017 1009   AST 15 05/01/2017 1009   ALT 14 05/01/2017 1009   ALKPHOS 61 05/01/2017 1009   BILITOT 0.3 05/01/2017 1009  Component Value Date/Time   TSH 1.640 05/01/2017 1009   TSH 1.850 01/09/2012 0956    ASSESSMENT AND PLAN: Prediabetes - Plan: metFORMIN (GLUCOPHAGE) 500 MG tablet  Vitamin D deficiency - Plan: Vitamin D, Ergocalciferol, (DRISDOL) 50000 units CAPS capsule  Class 3 severe obesity with serious comorbidity and body mass index (BMI) of 45.0 to 49.9 in adult, unspecified obesity type [facility](HCC)  PLAN:  Pre-Diabetes Cynthia [name] will continue to work on weight loss, exercise, and decreasing simple carbohydrates in her diet to help decrease the risk of diabetes. We dicussed metformin including benefits and risks. She was informed that eating too many simple carbohydrates or too many calories at one sitting increases the likelihood of GI side effects. Cynthia [name] agrees to continue taking metformin 500 mg q AM #30 and we will refill for 1 month. Cynthia [name] agrees to follow up with our clinic in 2 to 3 weeks as directed to monitor her progress.  Vitamin D Deficiency Cynthia [name] was informed that low vitamin D levels contributes to fatigue and are associated with obesity, breast, and colon cancer. Cynthia [name] agrees to continue taking prescription Vit D @50 ,000 IU every week #4 and we will refill for 1 month. She will follow up for routine testing of vitamin D, at least 2-3 times per year. She was informed of the risk of over-replacement of vitamin D and agrees to not increase her dose unless he discusses this with us first. Cynthia [name] agrees to follow up with our clinic in 2 to 3 weeks.  Obesity Cynthia [name] is currently in the action stage of change. As such, her goal is to continue with weight loss  efforts She has agreed to follow the Category 2 plan Cynthia [name] has been instructed to work up to a goal of 150 minutes of combined cardio and strengthening exercise per week for weight loss and overall health benefits. We discussed the following Behavioral Modification Strategies today: increasing lean protein intake, dealing with family or coworker sabotage, holiday eating strategies  and emotional eating strategies   Cynthia [name] has agreed to follow up with our clinic in 2 to 3 weeks. She was informed of the importance of frequent follow up visits to maximize her success with intensive lifestyle modifications for her multiple health conditions.  I, Burt KnackSharon Martin, am acting as transcriptionist for Quillian Quincearen Sharin Altidor, MD  I have reviewed the above documentation for accuracy and completeness, and I agree with the above. -Quillian Quincearen Neilan Rizzo, MD      Today's visit was # 4 out of 22.  Starting weight: 264 lbs Starting date: 05/01/17 Today's weight : 256 lbs  Today's date: 06/11/2017 Total lbs lost to date: 8 (Patients must lose 7 lbs in the first 6 months to continue with counseling)   ASK: We discussed the diagnosis of obesity with Simonne MaffucciSarah C Bala today and Adam agreed to give us permission to discuss obesity behavioral modification therapy today.  ASSESS: Cynthia [name] has the diagnosis of obesity and her BMI today is 6548.4 Cynthia [name] is in the action stage of change   ADVISE: Cynthia [name] was educated on the multiple health risks of obesity as well as the benefit of weight loss to improve her health. She was advised of the need for long term treatment and the importance of lifestyle modifications.  AGREE: Multiple dietary modification options and treatment options were discussed and  Cynthia [name] agreed to follow the Category 2 plan We discussed the following Behavioral Modification Strategies today: increasing lean protein intake, dealing with family or coworker sabotage, holiday eating strategies  and emotional eating  strategies

## 2017-06-12 ENCOUNTER — Other Ambulatory Visit: Payer: Self-pay | Admitting: Orthopedic Surgery

## 2017-06-12 DIAGNOSIS — M25562 Pain in left knee: Secondary | ICD-10-CM

## 2017-06-20 DIAGNOSIS — G8929 Other chronic pain: Secondary | ICD-10-CM | POA: Diagnosis not present

## 2017-06-20 DIAGNOSIS — M25562 Pain in left knee: Secondary | ICD-10-CM | POA: Diagnosis not present

## 2017-06-21 ENCOUNTER — Encounter (INDEPENDENT_AMBULATORY_CARE_PROVIDER_SITE_OTHER): Payer: Self-pay | Admitting: Family Medicine

## 2017-06-25 ENCOUNTER — Other Ambulatory Visit: Payer: BLUE CROSS/BLUE SHIELD

## 2017-06-26 ENCOUNTER — Encounter (INDEPENDENT_AMBULATORY_CARE_PROVIDER_SITE_OTHER): Payer: Self-pay

## 2017-06-26 ENCOUNTER — Ambulatory Visit (INDEPENDENT_AMBULATORY_CARE_PROVIDER_SITE_OTHER): Payer: BLUE CROSS/BLUE SHIELD | Admitting: Family Medicine

## 2017-06-27 DIAGNOSIS — G8929 Other chronic pain: Secondary | ICD-10-CM | POA: Diagnosis not present

## 2017-06-27 DIAGNOSIS — M25562 Pain in left knee: Secondary | ICD-10-CM | POA: Diagnosis not present

## 2017-07-11 ENCOUNTER — Ambulatory Visit (INDEPENDENT_AMBULATORY_CARE_PROVIDER_SITE_OTHER): Payer: BLUE CROSS/BLUE SHIELD | Admitting: Family Medicine

## 2017-07-11 VITALS — BP 116/72 | HR 99 | Temp 98.7°F | Ht 61.0 in | Wt 258.0 lb

## 2017-07-11 DIAGNOSIS — Z6841 Body Mass Index (BMI) 40.0 and over, adult: Secondary | ICD-10-CM

## 2017-07-11 DIAGNOSIS — E559 Vitamin D deficiency, unspecified: Secondary | ICD-10-CM | POA: Diagnosis not present

## 2017-07-11 MED ORDER — VITAMIN D (ERGOCALCIFEROL) 1.25 MG (50000 UNIT) PO CAPS: 50000.0000 [IU] | ORAL_CAPSULE | ORAL | 0 refills | Status: DC

## 2017-07-11 NOTE — Progress Notes (Signed)
Office: [phone]  /  Fax: [phone]   HPI:   Chief Complaint: OBESITY Cynthia [name] is here to discuss her progress with her obesity treatment plan. She is on the Category 2 plan and is following her eating plan approximately 50 % of the time. She states she is exercising 0 minutes 0 times per week. Cynthia [name] has been off track with decrease exercise with recent knee injury, increased temptations, and emotional eating. She was tearful discussing her worrying about knee replacement possibility and weight gain during recovery.  Her weight is 258 lb (117 kg) today and has gained 2 pounds since her last visit. She has lost 6 lbs since starting treatment with us.  Vitamin D deficiency Cynthia [name] has a diagnosis of vitamin D deficiency. She on prescription Vit D, but not yet at goal. She notes fatigue is improving and denies nausea, vomiting or muscle weakness.  ALLERGIES: Allergies  Allergen Reactions  . Penicillins     MEDICATIONS: Current Outpatient Medications on File Prior to Visit  Medication Sig Dispense Refill  . albuterol (PROAIR HFA) 108 (90 Base) MCG/ACT inhaler Inhale 1-2 puffs into the lungs every 6 (six) hours as needed for wheezing or shortness of breath.    . desloratadine (CLARINEX) 5 MG tablet Take 1 tablet by mouth daily.  0  . fluticasone furoate-vilanterol (BREO ELLIPTA) 200-25 MCG/INH AEPB Inhale 1 puff into the lungs daily.    . hyoscyamine (LEVSIN SL) 0.125 MG SL tablet Take 1 tablet by mouth as directed.  0  . ibuprofen (ADVIL,MOTRIN) 200 MG tablet Take 200 mg by mouth every 6 (six) hours as needed.    . metFORMIN (GLUCOPHAGE) 500 MG tablet Take 1 tablet (500 mg total) by mouth daily with breakfast. 30 tablet 0  . Montelukast Sodium (SINGULAIR PO) Take by mouth.      . norethindrone-ethinyl estradiol (MICROGESTIN FE 1/20) 1-20 MG-MCG tablet Take 1 tablet by mouth daily. 3 Package 4  . olopatadine (PATANOL) 0.1 % ophthalmic solution 1 drop 2 (two) times daily.    Illa Level. QNASL 80  MCG/ACT AERS as directed.  0  . traMADol (ULTRAM) 50 MG tablet as needed.  0  . traZODone (DESYREL) 100 MG tablet Take 100 mg by mouth at bedtime.     . Vitamin D, Ergocalciferol, (DRISDOL) 50000 units CAPS capsule Take 1 capsule (50,000 Units total) by mouth every 7 (seven) days. 4 capsule 0   No current facility-administered medications on file prior to visit.     PAST MEDICAL HISTORY: Past Medical History:  Diagnosis Date  . ADHD   . ADHD (attention deficit hyperactivity disorder)   . Allergic rhinitis   . Anxiety   . Asthma   . Back injury    COMPETITIVE INJURY IN HIGH SCHOOL  . Back pain   . Depression   . Fatty liver   . Fibromyalgia   . Gallbladder problem   . GERD (gastroesophageal reflux disease)   . IBS (irritable bowel syndrome)   . Joint pain   . Pneumonia     PAST SURGICAL HISTORY: Past Surgical History:  Procedure Laterality Date  . CHOLECYSTECTOMY    . CORONARY ARTERY BYPASS GRAFT    . DG DILATION URETERS    . SHOULDER SURGERY  2002   RIGHT  . WRIST ARTHROSCOPY     Right    SOCIAL HISTORY: Social History   Tobacco Use  . Smoking status: Never Smoker  . Smokeless tobacco: Never Used  Substance Use Topics  .  Alcohol use: Yes  . Drug use: No    FAMILY HISTORY: Family History  Problem Relation Age of Onset  . Multiple sclerosis Mother   . Diabetes Mother   . Hyperlipidemia Mother   . Thyroid disease Mother   . Depression Mother   . Anxiety disorder Mother   . Obesity Mother   . Hypertension Father   . Diabetes Father   . Hyperlipidemia Father   . Obesity Father   . Diabetes Maternal Grandfather   . Cancer Maternal Grandfather        prostate  . Heart disease Maternal Grandfather   . Heart disease Paternal Grandfather     ROS: Review of Systems  Constitutional: Positive for malaise/fatigue and weight loss.  Gastrointestinal: Negative for nausea and vomiting.  Musculoskeletal:       Negative muscle weakness    PHYSICAL  EXAM: Blood pressure 116/72, pulse 99, temperature 98.7 F (37.1 C), temperature source Oral, height 5\' 1"  (1.549 m), weight 258 lb (117 kg), last menstrual period 06/26/2017, SpO2 98 %. Body mass index is 48.75 kg/m. Physical Exam  Constitutional: She is oriented to person, place, and time. She appears well-developed and well-nourished.  Cardiovascular: Normal rate.  Pulmonary/Chest: Effort normal.  Musculoskeletal: Normal range of motion.  Neurological: She is oriented to person, place, and time.  Skin: Skin is warm and dry.  Psychiatric: She has a normal mood and affect. Her behavior is normal.  Vitals reviewed.   RECENT LABS AND TESTS: BMET    Component Value Date/Time   NA 139 05/01/2017 1009   K 4.6 05/01/2017 1009   CL 104 05/01/2017 1009   CO2 22 05/01/2017 1009   GLUCOSE 80 05/01/2017 1009   GLUCOSE 88 01/09/2012 0956   BUN 10 05/01/2017 1009   CREATININE 0.77 05/01/2017 1009   CALCIUM 9.1 05/01/2017 1009   GFRNONAA 103 05/01/2017 1009   GFRAA 119 05/01/2017 1009   Lab Results  Component Value Date   HGBA1C 5.3 05/01/2017   Lab Results  Component Value Date   INSULIN 11.1 05/01/2017   CBC    Component Value Date/Time   WBC 6.8 05/01/2017 1009   WBC 7.8 01/09/2012 0956   RBC 4.67 05/01/2017 1009   RBC 5.06 01/09/2012 0956   HGB 14.0 05/01/2017 1009   HCT 43.3 05/01/2017 1009   PLT 285 01/09/2012 0956   MCV 93 05/01/2017 1009   MCH 30.0 05/01/2017 1009   MCH 29.6 01/09/2012 0956   MCHC 32.3 05/01/2017 1009   MCHC 33.3 01/09/2012 0956   RDW 13.7 05/01/2017 1009   LYMPHSABS 1.5 05/01/2017 1009   MONOABS 0.7 01/09/2012 0956   EOSABS 0.1 05/01/2017 1009   BASOSABS 0.0 05/01/2017 1009   Iron/TIBC/Ferritin/ %Sat No results found for: IRON, TIBC, FERRITIN, IRONPCTSAT Lipid Panel     Component Value Date/Time   CHOL 149 05/01/2017 1009   TRIG 53 05/01/2017 1009   HDL 48 05/01/2017 1009   CHOLHDL 3.4 01/09/2012 0956   VLDL 14 01/09/2012 0956    LDLCALC 90 05/01/2017 1009   Hepatic Function Panel     Component Value Date/Time   PROT 6.8 05/01/2017 1009   ALBUMIN 4.2 05/01/2017 1009   AST 15 05/01/2017 1009   ALT 14 05/01/2017 1009   ALKPHOS 61 05/01/2017 1009   BILITOT 0.3 05/01/2017 1009      Component Value Date/Time   TSH 1.640 05/01/2017 1009   TSH 1.850 01/09/2012 0956    ASSESSMENT AND PLAN: Vitamin  D deficiency - Plan: Vitamin D, Ergocalciferol, (DRISDOL) 50000 units CAPS capsule  Class 3 severe obesity with serious comorbidity and body mass index (BMI) of 45.0 to 49.9 in adult, unspecified obesity type (HCC)  PLAN:  Vitamin D Deficiency Shemia was informed that low vitamin D levels contributes to fatigue and are associated with obesity, breast, and colon cancer. Seraya agrees to continue taking prescription Vit D @50 ,000 IU every week #4 and we will refill for 1 month. She will follow up for routine testing of vitamin D, at least 2-3 times per year. She was informed of the risk of over-replacement of vitamin D and agrees to not increase her dose unless he discusses this with [place] first. Brenetta agrees to follow up with our clinic in 3 weeks.  Obesity Nguyen is currently in the action stage of change. As such, her goal is to continue with weight loss efforts She has agreed to follow the Category 2 plan Lelia has been instructed to work up to a goal of 150 minutes of combined cardio and strengthening exercise per week for weight loss and overall health benefits. We discussed the following Behavioral Modification Strategies today: holiday eating strategies  and emotional eating strategies   Mirra has agreed to follow up with our clinic in 3 weeks. She was informed of the importance of frequent follow up visits to maximize her success with intensive lifestyle modifications for her multiple health conditions.  I, Burt Knack, am acting as transcriptionist for Quillian Quince, MD  I have reviewed the above documentation  for accuracy and completeness, and I agree with the above. -Quillian Quince, MD     Today's visit was # 5 out of 22.  Starting weight: 264 lbs Starting date: 05/01/17 Today's weight : 258 lbs  Today's date: 07/11/2017 Total lbs lost to date: 6 (Patients must lose 7 lbs in the first 6 months to continue with counseling)   ASK: We discussed the diagnosis of obesity with [name] today and Djuna agreed to give [place] permission to discuss obesity behavioral modification therapy today.  ASSESS: Tacara has the diagnosis of obesity and her BMI today is 48.77 Lillan is in the action stage of change   ADVISE: Novalyn was educated on the multiple health risks of obesity as well as the benefit of weight loss to improve her health. She was advised of the need for long term treatment and the importance of lifestyle modifications.  AGREE: Multiple dietary modification options and treatment options were discussed and  Oniyah agreed to follow the Category 2 plan We discussed the following Behavioral Modification Strategies today: holiday eating strategies  and emotional eating strategies

## 2017-07-12 DIAGNOSIS — M228X2 Other disorders of patella, left knee: Secondary | ICD-10-CM | POA: Diagnosis not present

## 2017-07-18 DIAGNOSIS — M228X2 Other disorders of patella, left knee: Secondary | ICD-10-CM | POA: Diagnosis not present

## 2017-08-01 ENCOUNTER — Ambulatory Visit (INDEPENDENT_AMBULATORY_CARE_PROVIDER_SITE_OTHER): Payer: 59 | Admitting: Family Medicine

## 2017-08-01 VITALS — BP 135/84 | HR 98 | Temp 98.3°F | Ht 61.0 in | Wt 257.0 lb

## 2017-08-01 DIAGNOSIS — E559 Vitamin D deficiency, unspecified: Secondary | ICD-10-CM

## 2017-08-01 DIAGNOSIS — Z9189 Other specified personal risk factors, not elsewhere classified: Secondary | ICD-10-CM | POA: Diagnosis not present

## 2017-08-01 DIAGNOSIS — Z6841 Body Mass Index (BMI) 40.0 and over, adult: Secondary | ICD-10-CM

## 2017-08-01 MED ORDER — VITAMIN D (ERGOCALCIFEROL) 1.25 MG (50000 UNIT) PO CAPS
50000.0000 [IU] | ORAL_CAPSULE | ORAL | 0 refills | Status: DC
Start: 2017-08-01 — End: 2017-10-02

## 2017-08-01 NOTE — Progress Notes (Signed)
Office: [phone]  /  Fax: [phone]   HPI:   Chief Complaint: OBESITY Cynthia [name] is here to discuss her progress with her obesity treatment plan. She is on the Category 2 plan and is following her eating plan approximately 85 % of the time. She states she is exercising 0 minutes 0 times per week. Cynthia [name] did well minimizing weight gain over the holidays. She is getting ready to have knee reconstructive surgery and she is worried about weight gain while recovering.  Her weight is 257 lb (116.6 kg) today and has had a weight loss of 1 pound over a period of 3 weeks since her last visit. She has lost 7 lbs since starting treatment with [place].  Vitamin D deficiency Cynthia [name] has a diagnosis of vitamin D deficiency. She is stable on prescription Vit D, but not yet at goal. She denies nausea, vomiting or muscle weakness.  At risk for osteopenia and osteoporosis Cynthia [name] is at higher risk of osteopenia and osteoporosis due to vitamin D deficiency.   ALLERGIES: Allergies  Allergen Reactions  . Penicillins     MEDICATIONS: Current Outpatient Medications on File Prior to Visit  Medication Sig Dispense Refill  . albuterol (PROAIR HFA) 108 (90 Base) MCG/ACT inhaler Inhale 1-2 puffs into the lungs every 6 (six) hours as needed for wheezing or shortness of breath.    . desloratadine (CLARINEX) 5 MG tablet Take 1 tablet by mouth daily.  0  . doxycycline (VIBRAMYCIN) 100 MG capsule Take 100 mg by mouth daily.    . fluticasone furoate-vilanterol (BREO ELLIPTA) 200-25 MCG/INH AEPB Inhale 1 puff into the lungs daily.    . hyoscyamine (LEVSIN SL) 0.125 MG SL tablet Take 1 tablet by mouth as directed.  0  . ibuprofen (ADVIL,MOTRIN) 200 MG tablet Take 200 mg by mouth every 6 (six) hours as needed.    . metFORMIN (GLUCOPHAGE) 500 MG tablet Take 1 tablet (500 mg total) by mouth daily with breakfast. 30 tablet 0  . methocarbamol (ROBAXIN) 500 MG tablet Take 500 mg by mouth 3 (three) times daily.    . Montelukast  Sodium (SINGULAIR PO) Take by mouth.      . norethindrone-ethinyl estradiol (MICROGESTIN FE 1/20) 1-20 MG-MCG tablet Take 1 tablet by mouth daily. 3 Package 4  . olopatadine (PATANOL) 0.1 % ophthalmic solution 1 drop 2 (two) times daily.    Cynthia [name] 80 MCG/ACT AERS as directed.  0  . traMADol (ULTRAM) 50 MG tablet as needed.  0  . traZODone (DESYREL) 100 MG tablet Take 100 mg by mouth at bedtime.      No current facility-administered medications on file prior to visit.     PAST MEDICAL HISTORY: Past Medical History:  Diagnosis Date  . ADHD   . ADHD (attention deficit hyperactivity disorder)   . Allergic rhinitis   . Anxiety   . Asthma   . Back injury    COMPETITIVE INJURY IN HIGH SCHOOL  . Back pain   . Depression   . Fatty liver   . Fibromyalgia   . Gallbladder problem   . GERD (gastroesophageal reflux disease)   . IBS (irritable bowel syndrome)   . Joint pain   . Pneumonia     PAST SURGICAL HISTORY: Past Surgical History:  Procedure Laterality Date  . CHOLECYSTECTOMY    . CORONARY ARTERY BYPASS GRAFT    . DG DILATION URETERS    . SHOULDER SURGERY  2002   RIGHT  . WRIST ARTHROSCOPY  Right    SOCIAL HISTORY: Social History   Tobacco Use  . Smoking status: Never Smoker  . Smokeless tobacco: Never Used  Substance Use Topics  . Alcohol use: Yes  . Drug use: No    FAMILY HISTORY: Family History  Problem Relation Age of Onset  . Multiple sclerosis Mother   . Diabetes Mother   . Hyperlipidemia Mother   . Thyroid disease Mother   . Depression Mother   . Anxiety disorder Mother   . Obesity Mother   . Hypertension Father   . Diabetes Father   . Hyperlipidemia Father   . Obesity Father   . Diabetes Maternal Grandfather   . Cancer Maternal Grandfather        prostate  . Heart disease Maternal Grandfather   . Heart disease Paternal Grandfather     ROS: Review of Systems  Constitutional: Positive for weight loss.  Gastrointestinal: Negative for  nausea and vomiting.  Musculoskeletal:       Negative muscle weakness    PHYSICAL EXAM: Blood pressure 135/84, pulse 98, temperature 98.3 F (36.8 C), temperature source Oral, height 5\' 1"  (1.549 m), weight 257 lb (116.6 kg), SpO2 96 %. Body mass index is 48.56 kg/m. Physical Exam  Constitutional: She is oriented to person, place, and time. She appears well-developed and well-nourished.  Cardiovascular: Normal rate.  Pulmonary/Chest: Effort normal.  Musculoskeletal: Normal range of motion.  Neurological: She is oriented to person, place, and time.  Skin: Skin is warm and dry.  Psychiatric: She has a normal mood and affect. Her behavior is normal.  Vitals reviewed.   RECENT LABS AND TESTS: BMET    Component Value Date/Time   NA 139 05/01/2017 1009   K 4.6 05/01/2017 1009   CL 104 05/01/2017 1009   CO2 22 05/01/2017 1009   GLUCOSE 80 05/01/2017 1009   GLUCOSE 88 01/09/2012 0956   BUN 10 05/01/2017 1009   CREATININE 0.77 05/01/2017 1009   CALCIUM 9.1 05/01/2017 1009   GFRNONAA 103 05/01/2017 1009   GFRAA 119 05/01/2017 1009   Lab Results  Component Value Date   HGBA1C 5.3 05/01/2017   Lab Results  Component Value Date   INSULIN 11.1 05/01/2017   CBC    Component Value Date/Time   WBC 6.8 05/01/2017 1009   WBC 7.8 01/09/2012 0956   RBC 4.67 05/01/2017 1009   RBC 5.06 01/09/2012 0956   HGB 14.0 05/01/2017 1009   HCT 43.3 05/01/2017 1009   PLT 285 01/09/2012 0956   MCV 93 05/01/2017 1009   MCH 30.0 05/01/2017 1009   MCH 29.6 01/09/2012 0956   MCHC 32.3 05/01/2017 1009   MCHC 33.3 01/09/2012 0956   RDW 13.7 05/01/2017 1009   LYMPHSABS 1.5 05/01/2017 1009   MONOABS 0.7 01/09/2012 0956   EOSABS 0.1 05/01/2017 1009   BASOSABS 0.0 05/01/2017 1009   Iron/TIBC/Ferritin/ %Sat No results found for: IRON, TIBC, FERRITIN, IRONPCTSAT Lipid Panel     Component Value Date/Time   CHOL 149 05/01/2017 1009   TRIG 53 05/01/2017 1009   HDL 48 05/01/2017 1009   CHOLHDL  3.4 01/09/2012 0956   VLDL 14 01/09/2012 0956   LDLCALC 90 05/01/2017 1009   Hepatic Function Panel     Component Value Date/Time   PROT 6.8 05/01/2017 1009   ALBUMIN 4.2 05/01/2017 1009   AST 15 05/01/2017 1009   ALT 14 05/01/2017 1009   ALKPHOS 61 05/01/2017 1009   BILITOT 0.3 05/01/2017 1009  Component Value Date/Time   TSH 1.640 05/01/2017 1009   TSH 1.850 01/09/2012 0956  Results for NAZARIA, RIESEN (MRN [id number]) as of 08/01/2017 14:07  Ref. Range 05/01/2017 10:09  Vitamin D, 25-Hydroxy Latest Ref Range: 30.0 - 100.0 ng/mL 14.6 (L)    ASSESSMENT AND PLAN: Vitamin D deficiency - Plan: Vitamin D, Ergocalciferol, (DRISDOL) 50000 units CAPS capsule  Class 3 severe obesity with serious comorbidity and body mass index (BMI) of 45.0 to 49.9 in adult, unspecified obesity type (HCC)  PLAN:  Vitamin D Deficiency Cynthia [name] was informed that low vitamin D levels contributes to fatigue and are associated with obesity, breast, and colon cancer. Cynthia [name] agrees to continue taking prescription Vit D @50 ,000 IU every week #4 and we will refill for 1 month. She will follow up for routine testing of vitamin D, at least 2-3 times per year. She was informed of the risk of over-replacement of vitamin D and agrees to not increase her dose unless he discusses this with [place] first. We will recheck labs in 1 month and Cynthia [name] agrees to follow up with our clinic in 4 weeks.  At risk for osteopenia and osteoporosis Cynthia [name] is at risk for osteopenia and osteoporsis due to her vitamin D deficiency. She was encouraged to take her vitamin D and follow her higher calcium diet and increase strengthening exercise to help strengthen her bones and decrease her risk of osteopenia and osteoporosis.  Obesity Cynthia [name] is currently in the action stage of change. As such, her goal is to continue with weight loss efforts She has agreed to follow the Category 2 plan Cynthia [name] has been instructed to work up to a goal of 150  minutes of combined cardio and strengthening exercise per week or per Physical Therapy for weight loss and overall health benefits. We discussed the following Behavioral Modification Strategies today: increasing lean protein intake We discussed strategies to reduce boredom eating while recovering and family support.  Cynthia [name] has agreed to follow up with our clinic in 4 weeks. She was informed of the importance of frequent follow up visits to maximize her success with intensive lifestyle modifications for her multiple health conditions.   OBESITY BEHAVIORAL INTERVENTION VISIT  Today's visit was # 6 out of 22.  Starting weight: 264 lbs Starting date: 05/01/17 Today's weight : 257 lbs  Today's date: 08/01/2017 Total lbs lost to date: 7 (Patients must lose 7 lbs in the first 6 months to continue with counseling)   ASK: We discussed the diagnosis of obesity with Cynthia [name] today and Cynthia [name] agreed to give [place] permission to discuss obesity behavioral modification therapy today.  ASSESS: Cynthia [name] has the diagnosis of obesity and her BMI today is 71.58 Cynthia [name] is in the action stage of change   ADVISE: Cynthia [name] was educated on the multiple health risks of obesity as well as the benefit of weight loss to improve her health. She was advised of the need for long term treatment and the importance of lifestyle modifications.  AGREE: Multiple dietary modification options and treatment options were discussed and  Jakerria agreed to the above obesity treatment plan.  I, Burt Knack, am acting as transcriptionist for Quillian Quince, MD  I have reviewed the above documentation for accuracy and completeness, and I agree with the above. -Quillian Quince, MD

## 2017-08-29 ENCOUNTER — Ambulatory Visit (INDEPENDENT_AMBULATORY_CARE_PROVIDER_SITE_OTHER): Payer: 59 | Admitting: Family Medicine

## 2017-09-02 ENCOUNTER — Ambulatory Visit (INDEPENDENT_AMBULATORY_CARE_PROVIDER_SITE_OTHER): Payer: 59 | Admitting: Family Medicine

## 2017-09-02 VITALS — BP 138/77 | HR 97 | Temp 98.2°F | Ht 61.0 in | Wt 262.0 lb

## 2017-09-02 DIAGNOSIS — E66813 Obesity, class 3: Secondary | ICD-10-CM

## 2017-09-02 DIAGNOSIS — Z6841 Body Mass Index (BMI) 40.0 and over, adult: Secondary | ICD-10-CM | POA: Diagnosis not present

## 2017-09-02 DIAGNOSIS — E88819 Insulin resistance, unspecified: Secondary | ICD-10-CM

## 2017-09-02 DIAGNOSIS — E8881 Metabolic syndrome: Secondary | ICD-10-CM | POA: Diagnosis not present

## 2017-09-02 NOTE — Progress Notes (Signed)
Office: [phone]  /  Fax: [phone]   HPI:   Chief Complaint: OBESITY Cynthia [name] is here to discuss her progress with her obesity treatment plan. She is on the Category 2 plan and is following her eating plan approximately 30 % of the time. She states she is exercising 0 minutes 0 times per week. Cynthia [name] had surgery recently and notes increase snacking, she is not ready to follow a structured diet plan. She skips meals and then snacks on simple carbohydrates.  Her weight is 262 lb (118.8 kg) today and has gained 5 pounds since her last visit. She has lost 2 lbs since starting treatment with [place].  Insulin Resistance Cynthia [name] has a diagnosis of insulin resistance based on her elevated fasting insulin [name] >5. Although Cynthia [name] blood glucose readings are still under good control, insulin resistance puts her at greater risk of metabolic syndrome and diabetes. She has been off metformin after surgery but recently restarted yesterday. She has been of track with diet and increased simple carbohydrates, polyphagia worsened.  ALLERGIES: Allergies  Allergen Reactions  . Penicillins     MEDICATIONS: Current Outpatient Medications on File Prior to Visit  Medication Sig Dispense Refill  . albuterol (PROAIR HFA) 108 (90 Base) MCG/ACT inhaler Inhale 1-2 puffs into the lungs every 6 (six) hours as needed for wheezing or shortness of breath.    . desloratadine (CLARINEX) 5 MG tablet Take 1 tablet by mouth daily.  0  . doxycycline (VIBRAMYCIN) 100 MG capsule Take 100 mg by mouth daily.    . fluticasone furoate-vilanterol (BREO ELLIPTA) 200-25 MCG/INH AEPB Inhale 1 puff into the lungs daily.    . hyoscyamine (LEVSIN SL) 0.125 MG SL tablet Take 1 tablet by mouth as directed.  0  . ibuprofen (ADVIL,MOTRIN) 200 MG tablet Take 200 mg by mouth every 6 (six) hours as needed.    . metFORMIN (GLUCOPHAGE) 500 MG tablet Take 1 tablet (500 mg total) by mouth daily with breakfast. 30 tablet 0  . methocarbamol  (ROBAXIN) 500 MG tablet Take 500 mg by mouth 3 (three) times daily.    . Montelukast Sodium (SINGULAIR PO) Take by mouth.      . norethindrone-ethinyl estradiol (MICROGESTIN FE 1/20) 1-20 MG-MCG tablet Take 1 tablet by mouth daily. 3 Package 4  . olopatadine (PATANOL) 0.1 % ophthalmic solution 1 drop 2 (two) times daily.    Cynthia [name] 80 MCG/ACT AERS as directed.  0  . traMADol (ULTRAM) 50 MG tablet as needed.  0  . traZODone (DESYREL) 100 MG tablet Take 100 mg by mouth at bedtime.     . Vitamin D, Ergocalciferol, (DRISDOL) 50000 units CAPS capsule Take 1 capsule (50,000 Units total) by mouth every 7 (seven) days. 4 capsule 0   No current facility-administered medications on file prior to visit.     PAST MEDICAL HISTORY: Past Medical History:  Diagnosis Date  . ADHD   . ADHD (attention deficit hyperactivity disorder)   . Allergic rhinitis   . Anxiety   . Asthma   . Back injury    COMPETITIVE INJURY IN HIGH SCHOOL  . Back pain   . Depression   . Fatty liver   . Fibromyalgia   . Gallbladder problem   . GERD (gastroesophageal reflux disease)   . IBS (irritable bowel syndrome)   . Joint pain   . Pneumonia     PAST SURGICAL HISTORY: Past Surgical History:  Procedure Laterality Date  . CHOLECYSTECTOMY    . CORONARY ARTERY  BYPASS GRAFT    . DG DILATION URETERS    . SHOULDER SURGERY  2002   RIGHT  . WRIST ARTHROSCOPY     Right    SOCIAL HISTORY: Social History   Tobacco Use  . Smoking status: Never Smoker  . Smokeless tobacco: Never Used  Substance Use Topics  . Alcohol use: Yes  . Drug use: No    FAMILY HISTORY: Family History  Problem Relation Age of Onset  . Multiple sclerosis Mother   . Diabetes Mother   . Hyperlipidemia Mother   . Thyroid disease Mother   . Depression Mother   . Anxiety disorder Mother   . Obesity Mother   . Hypertension Father   . Diabetes Father   . Hyperlipidemia Father   . Obesity Father   . Diabetes Maternal Grandfather   . Cancer  Maternal Grandfather        prostate  . Heart disease Maternal Grandfather   . Heart disease Paternal Grandfather     ROS: Review of Systems  Constitutional: Negative for weight loss.  Endo/Heme/Allergies:       Positive polyphagia    PHYSICAL EXAM: Blood pressure 138/77, pulse 97, temperature 98.2 F (36.8 C), temperature source Oral, height 5\' 1"  (1.549 m), weight 262 lb (118.8 kg), SpO2 97 %. Body mass index is 49.5 kg/m. Physical Exam  Constitutional: She is oriented to person, place, and time. She appears well-developed and well-nourished.  Cardiovascular: Normal rate.  Pulmonary/Chest: Effort normal.  Musculoskeletal: Normal range of motion.  Neurological: She is oriented to person, place, and time.  Skin: Skin is warm and dry.  Psychiatric: She has a normal mood and affect. Her behavior is normal.  Vitals reviewed.   RECENT LABS AND TESTS: BMET    Component Value Date/Time   NA 139 05/01/2017 1009   K 4.6 05/01/2017 1009   CL 104 05/01/2017 1009   CO2 22 05/01/2017 1009   GLUCOSE 80 05/01/2017 1009   GLUCOSE 88 01/09/2012 0956   BUN 10 05/01/2017 1009   CREATININE 0.77 05/01/2017 1009   CALCIUM 9.1 05/01/2017 1009   GFRNONAA 103 05/01/2017 1009   GFRAA 119 05/01/2017 1009   Lab Results  Component Value Date   HGBA1C 5.3 05/01/2017   Lab Results  Component Value Date   INSULIN 11.1 05/01/2017   CBC    Component Value Date/Time   WBC 6.8 05/01/2017 1009   WBC 7.8 01/09/2012 0956   RBC 4.67 05/01/2017 1009   RBC 5.06 01/09/2012 0956   HGB 14.0 05/01/2017 1009   HCT 43.3 05/01/2017 1009   PLT 285 01/09/2012 0956   MCV 93 05/01/2017 1009   MCH 30.0 05/01/2017 1009   MCH 29.6 01/09/2012 0956   MCHC 32.3 05/01/2017 1009   MCHC 33.3 01/09/2012 0956   RDW 13.7 05/01/2017 1009   LYMPHSABS 1.5 05/01/2017 1009   MONOABS 0.7 01/09/2012 0956   EOSABS 0.1 05/01/2017 1009   BASOSABS 0.0 05/01/2017 1009   Iron/TIBC/Ferritin/ %Sat No results found for:  IRON, TIBC, FERRITIN, IRONPCTSAT Lipid Panel     Component Value Date/Time   CHOL 149 05/01/2017 1009   TRIG 53 05/01/2017 1009   HDL 48 05/01/2017 1009   CHOLHDL 3.4 01/09/2012 0956   VLDL 14 01/09/2012 0956   LDLCALC 90 05/01/2017 1009   Hepatic Function Panel     Component Value Date/Time   PROT 6.8 05/01/2017 1009   ALBUMIN 4.2 05/01/2017 1009   AST 15 05/01/2017 1009  ALT 14 05/01/2017 1009   ALKPHOS 61 05/01/2017 1009   BILITOT 0.3 05/01/2017 1009      Component Value Date/Time   TSH 1.640 05/01/2017 1009   TSH 1.850 01/09/2012 0956    ASSESSMENT AND PLAN: Insulin resistance  Class 3 severe obesity with serious comorbidity and body mass index (BMI) of 45.0 to 49.9 in adult, unspecified obesity type (HCC)  PLAN:  Insulin Resistance Cynthia [name] will continue to work on weight loss, exercise, and decreasing simple carbohydrates in her diet to help decrease the risk of diabetes. We dicussed metformin including benefits and risks. She was informed that eating too many simple carbohydrates or too many calories at one sitting increases the likelihood of GI side effects. Cynthia [name] agrees to continue metformin and back to diet prescription. We will recheck labs in 1 month and Cynthia [name] agrees to follow up with our clinic in 2 weeks with Dr. Rinaldo RatelKadolph as directed to monitor her progress.  We spent > than 50% of the 15 minute visit on the counseling as documented in the note.  Obesity Cynthia [name] is currently in the action stage of change. As such, her goal is to continue with weight loss efforts She has agreed to change to keep a food journal with 1000-1300 calories and 70+ grams of protein daily Cynthia [name] has been instructed to work up to a goal of 150 minutes of combined cardio and strengthening exercise per week for weight loss and overall health benefits. We discussed the following Behavioral Modification Strategies today: increasing lean protein intake, decreasing simple carbohydrates  and work  on meal planning and easy cooking plans   Cynthia [name] has agreed to follow up with our clinic in 2 weeks with Dr. Rinaldo RatelKadolph. She was informed of the importance of frequent follow up visits to maximize her success with intensive lifestyle modifications for her multiple health conditions.   OBESITY BEHAVIORAL INTERVENTION VISIT  Today'[name] visit was # 7 out of 22.  Starting weight: 264 lbs Starting date: 05/01/17 Today'[name] weight : 262 lbs  Today'[name] date: 09/02/2017 Total lbs lost to date: 2 (Patients must lose 7 lbs in the first 6 months to continue with counseling)   ASK: We discussed the diagnosis of obesity with Simonne MaffucciSarah C Baldwin today and [place] agreed to give us permission to discuss obesity behavioral modification therapy today.  ASSESS: Cynthia [name] has the diagnosis of obesity and her BMI today is 49.53 Cynthia [name] is in the action stage of change   ADVISE: Cynthia [name] was educated on the multiple health risks of obesity as well as the benefit of weight loss to improve her health. She was advised of the need for long term treatment and the importance of lifestyle modifications.  AGREE: Multiple dietary modification options and treatment options were discussed and  Cynthia [name] agreed to the above obesity treatment plan.  I, Burt KnackSharon Martin, am acting as transcriptionist for Quillian Quincearen Dyann Goodspeed, MD  I have reviewed the above documentation for accuracy and completeness, and I agree with the above. -Quillian Quincearen Zeev Deakins, MD

## 2017-09-16 ENCOUNTER — Ambulatory Visit (INDEPENDENT_AMBULATORY_CARE_PROVIDER_SITE_OTHER): Payer: 59 | Admitting: Family Medicine

## 2017-09-16 VITALS — BP 139/78 | HR 120 | Temp 98.1°F | Ht 61.0 in | Wt 261.0 lb

## 2017-09-16 DIAGNOSIS — E8881 Metabolic syndrome: Secondary | ICD-10-CM

## 2017-09-16 DIAGNOSIS — Z6841 Body Mass Index (BMI) 40.0 and over, adult: Secondary | ICD-10-CM | POA: Diagnosis not present

## 2017-09-16 DIAGNOSIS — E559 Vitamin D deficiency, unspecified: Secondary | ICD-10-CM

## 2017-09-17 NOTE — Progress Notes (Signed)
Office: [phone]  /  Fax: [phone]   HPI:   Chief Complaint: OBESITY Cynthia [name] is here to discuss her progress with her obesity treatment plan. She is on the keep a food journal with 1100 to 1300 calories and 70+ grams of protein daily and is following her eating plan approximately 80 % of the time. She states she is exercising 0 minutes 0 times per week. Cynthia [name] did really well with keeping a journal. She is averaging 83 grams of protein per day. Her weight is 261 lb (118.4 kg) today and has had a weight loss of 1 pound over a period of 2 weeks since her last visit. She has lost 3 lbs since starting treatment with [place].  Vitamin D deficiency Cynthia [name] has a diagnosis of vitamin D deficiency. She hasn't been taking vit D and admits fatigue. Cynthia [name] denies nausea, vomiting or muscle weakness. No refills are needed at this time.   Ref. Range 05/01/2017 10:09  Vitamin D, 25-Hydroxy Latest Ref Range: 30.0 - 100.0 ng/mL 14.6 (L)   Insulin Resistance Cynthia [name] has a diagnosis of insulin resistance based on her elevated fasting insulin [name] of 11.1 Her last A1c was at 5.3 Although Cynthia [name]'s blood glucose readings are still under good control, insulin resistance puts her at greater risk of metabolic syndrome and diabetes. She is taking metformin currently and continues to work on diet and exercise to decrease risk of diabetes.  ALLERGIES: Allergies  Allergen Reactions  . Penicillins     MEDICATIONS: Current Outpatient Medications on File Prior to Visit  Medication Sig Dispense Refill  . acetaminophen (TYLENOL) 500 MG tablet Take 1,000 mg by mouth every 6 (six) hours as needed.    Marland Kitchen albuterol (PROAIR HFA) 108 (90 Base) MCG/ACT inhaler Inhale 1-2 puffs into the lungs every 6 (six) hours as needed for wheezing or shortness of breath.    Marland Kitchen aspirin 325 MG tablet Take 325 mg by mouth 2 (two) times daily.    Marland Kitchen desloratadine (CLARINEX) 5 MG tablet Take 1 tablet by mouth daily.  0  . doxycycline (VIBRAMYCIN)  100 MG capsule Take 100 mg by mouth daily.    . fluticasone furoate-vilanterol (BREO ELLIPTA) 200-25 MCG/INH AEPB Inhale 1 puff into the lungs daily.    Marland Kitchen HYDROcodone-acetaminophen (NORCO/VICODIN) 5-325 MG tablet Take 1 tablet by mouth every 6 (six) hours as needed for moderate pain.    . hyoscyamine (LEVSIN SL) 0.125 MG SL tablet Take 1 tablet by mouth as directed.  0  . ibuprofen (ADVIL,MOTRIN) 200 MG tablet Take 200 mg by mouth every 6 (six) hours as needed.    . methocarbamol (ROBAXIN) 500 MG tablet Take 500 mg by mouth 3 (three) times daily.    . Montelukast Sodium (SINGULAIR PO) Take by mouth.      . norethindrone-ethinyl estradiol (MICROGESTIN FE 1/20) 1-20 MG-MCG tablet Take 1 tablet by mouth daily. 3 Package 4  . olopatadine (PATANOL) 0.1 % ophthalmic solution 1 drop 2 (two) times daily.    Cynthia [name] 80 MCG/ACT AERS as directed.  0  . traMADol (ULTRAM) 50 MG tablet as needed.  0  . traZODone (DESYREL) 100 MG tablet Take 100 mg by mouth at bedtime.     . Vitamin D, Ergocalciferol, (DRISDOL) 50000 units CAPS capsule Take 1 capsule (50,000 Units total) by mouth every 7 (seven) days. 4 capsule 0   No current facility-administered medications on file prior to visit.     PAST MEDICAL HISTORY: Past Medical History:  Diagnosis  Date  . ADHD   . ADHD (attention deficit hyperactivity disorder)   . Allergic rhinitis   . Anxiety   . Asthma   . Back injury    COMPETITIVE INJURY IN HIGH SCHOOL  . Back pain   . Depression   . Fatty liver   . Fibromyalgia   . Gallbladder problem   . GERD (gastroesophageal reflux disease)   . IBS (irritable bowel syndrome)   . Joint pain   . Pneumonia     PAST SURGICAL HISTORY: Past Surgical History:  Procedure Laterality Date  . CHOLECYSTECTOMY    . CORONARY ARTERY BYPASS GRAFT    . DG DILATION URETERS    . SHOULDER SURGERY  2002   RIGHT  . WRIST ARTHROSCOPY     Right    SOCIAL HISTORY: Social History   Tobacco Use  . Smoking status: Never  Smoker  . Smokeless tobacco: Never Used  Substance Use Topics  . Alcohol use: Yes  . Drug use: No    FAMILY HISTORY: Family History  Problem Relation Age of Onset  . Multiple sclerosis Mother   . Diabetes Mother   . Hyperlipidemia Mother   . Thyroid disease Mother   . Depression Mother   . Anxiety disorder Mother   . Obesity Mother   . Hypertension Father   . Diabetes Father   . Hyperlipidemia Father   . Obesity Father   . Diabetes Maternal Grandfather   . Cancer Maternal Grandfather        prostate  . Heart disease Maternal Grandfather   . Heart disease Paternal Grandfather     ROS: Review of Systems  Constitutional: Positive for malaise/fatigue and weight loss.  Gastrointestinal: Negative for nausea and vomiting.  Musculoskeletal:       Negative for muscle weakness    PHYSICAL EXAM: Blood pressure 139/78, pulse (!) 120, temperature 98.1 F (36.7 C), temperature source Oral, height 5\' 1"  (1.549 m), weight 261 lb (118.4 kg), SpO2 96 %. Body mass index is 49.32 kg/m. Physical Exam  Constitutional: She is oriented to person, place, and time. She appears well-developed and well-nourished.  Cardiovascular: Normal rate.  Pulmonary/Chest: Effort normal.  Musculoskeletal: Normal range of motion.  Neurological: She is oriented to person, place, and time.  Skin: Skin is warm and dry.  Psychiatric: She has a normal mood and affect. Her behavior is normal.  Vitals reviewed.   RECENT LABS AND TESTS: BMET    Component Value Date/Time   NA 139 05/01/2017 1009   K 4.6 05/01/2017 1009   CL 104 05/01/2017 1009   CO2 22 05/01/2017 1009   GLUCOSE 80 05/01/2017 1009   GLUCOSE 88 01/09/2012 0956   BUN 10 05/01/2017 1009   CREATININE 0.77 05/01/2017 1009   CALCIUM 9.1 05/01/2017 1009   GFRNONAA 103 05/01/2017 1009   GFRAA 119 05/01/2017 1009   Lab Results  Component Value Date   HGBA1C 5.3 05/01/2017   Lab Results  Component Value Date   INSULIN 11.1 05/01/2017    CBC    Component Value Date/Time   WBC 6.8 05/01/2017 1009   WBC 7.8 01/09/2012 0956   RBC 4.67 05/01/2017 1009   RBC 5.06 01/09/2012 0956   HGB 14.0 05/01/2017 1009   HCT 43.3 05/01/2017 1009   PLT 285 01/09/2012 0956   MCV 93 05/01/2017 1009   MCH 30.0 05/01/2017 1009   MCH 29.6 01/09/2012 0956   MCHC 32.3 05/01/2017 1009   MCHC 33.3 01/09/2012 0956  RDW 13.7 05/01/2017 1009   LYMPHSABS 1.5 05/01/2017 1009   MONOABS 0.7 01/09/2012 0956   EOSABS 0.1 05/01/2017 1009   BASOSABS 0.0 05/01/2017 1009   Iron/TIBC/Ferritin/ %Sat No results found for: IRON, TIBC, FERRITIN, IRONPCTSAT Lipid Panel     Component Value Date/Time   CHOL 149 05/01/2017 1009   TRIG 53 05/01/2017 1009   HDL 48 05/01/2017 1009   CHOLHDL 3.4 01/09/2012 0956   VLDL 14 01/09/2012 0956   LDLCALC 90 05/01/2017 1009   Hepatic Function Panel     Component Value Date/Time   PROT 6.8 05/01/2017 1009   ALBUMIN 4.2 05/01/2017 1009   AST 15 05/01/2017 1009   ALT 14 05/01/2017 1009   ALKPHOS 61 05/01/2017 1009   BILITOT 0.3 05/01/2017 1009      Component Value Date/Time   TSH 1.640 05/01/2017 1009   TSH 1.850 01/09/2012 0956     Ref. Range 05/01/2017 10:09  Vitamin D, 25-Hydroxy Latest Ref Range: 30.0 - 100.0 ng/mL 14.6 (L)   ASSESSMENT AND PLAN: Insulin resistance  Vitamin D deficiency  Class 3 severe obesity with serious comorbidity and body mass index (BMI) of 45.0 to 49.9 in adult, unspecified obesity type (HCC)  PLAN:  Vitamin D Deficiency Cynthia [name] was informed that low vitamin D levels contributes to fatigue and are associated with obesity, breast, and colon cancer. She agrees to continue to take prescription Vit D @50 ,000 IU every week and will follow up for routine testing of vitamin D, at least 2-3 times per year. She was informed of the risk of over-replacement of vitamin D and agrees to not increase her dose unless she discusses this with [place] first.  Insulin Resistance Cynthia [name] will  continue to work on weight loss, exercise, and decreasing simple carbohydrates in her diet to help decrease the risk of diabetes. We dicussed metformin including benefits and risks. She was informed that eating too many simple carbohydrates or too many calories at one sitting increases the likelihood of GI side effects. Cynthia [name] will stop metformin and we will recheck labs at her earliest convenience (next visit). Cynthia [name] agreed to follow up with [place] as directed to monitor her progress.  We spent > than 50% of the 15 minute visit on the counseling as documented in the note.  Obesity Cynthia [name] is currently in the action stage of change. As such, her goal is to continue with weight loss efforts She has agreed to keep a food journal with 1100 to 1200 calories and 70 grams of protein daily  Cynthia [name] has been instructed to work up to a goal of 150 minutes of combined cardio and strengthening exercise per week for weight loss and overall health benefits. We discussed the following Behavioral Modification Strategies today: keep a strict food journal, increasing lean protein intake, work on meal planning and easy cooking plans and emotional eating strategies  Cynthia [name] has agreed to follow up with our clinic in 2 weeks. She was informed of the importance of frequent follow up visits to maximize her success with intensive lifestyle modifications for her multiple health conditions.   OBESITY BEHAVIORAL INTERVENTION VISIT  Today's visit was # 8 out of 22.  Starting weight: 264 lbs Starting date: 05/01/17 Today's weight : 261 lbs Today's date: 09/16/2017 Total lbs lost to date: 3 (Patients must lose 7 lbs in the first 6 months to continue with counseling)   ASK: We discussed the diagnosis of obesity with Cynthia [name] today and Cynthia [name] agreed to give [place] permission  to discuss obesity behavioral modification therapy today.  ASSESS: Cynthia SagoSarah has the diagnosis of obesity and her BMI today is 49.34 Cynthia SagoSarah is in the action  stage of change   ADVISE: Cynthia SagoSarah was educated on the multiple health risks of obesity as well as the benefit of weight loss to improve her health. She was advised of the need for long term treatment and the importance of lifestyle modifications.  AGREE: Multiple dietary modification options and treatment options were discussed and  Cynthia SagoSarah agreed to the above obesity treatment plan.  I, [place] CraneJoanne Murray, am acting as transcriptionist for Filbert SchilderAlexandria U. Kadolph, MD  I have reviewed the above documentation for accuracy and completeness, and I agree with the above. - Debbra RidingAlexandria Kadolph, MD

## 2017-09-30 ENCOUNTER — Ambulatory Visit (INDEPENDENT_AMBULATORY_CARE_PROVIDER_SITE_OTHER): Payer: 59 | Admitting: Family Medicine

## 2017-10-02 ENCOUNTER — Ambulatory Visit (INDEPENDENT_AMBULATORY_CARE_PROVIDER_SITE_OTHER): Payer: 59 | Admitting: Family Medicine

## 2017-10-02 VITALS — BP 135/78 | HR 121 | Temp 98.3°F | Ht 61.0 in | Wt 261.0 lb

## 2017-10-02 DIAGNOSIS — E8881 Metabolic syndrome: Secondary | ICD-10-CM

## 2017-10-02 DIAGNOSIS — Z9189 Other specified personal risk factors, not elsewhere classified: Secondary | ICD-10-CM | POA: Diagnosis not present

## 2017-10-02 DIAGNOSIS — Z6841 Body Mass Index (BMI) 40.0 and over, adult: Secondary | ICD-10-CM

## 2017-10-02 DIAGNOSIS — E559 Vitamin D deficiency, unspecified: Secondary | ICD-10-CM

## 2017-10-02 MED ORDER — VITAMIN D (ERGOCALCIFEROL) 1.25 MG (50000 UNIT) PO CAPS: 50000.0000 [IU] | ORAL_CAPSULE | ORAL | 0 refills | Status: DC

## 2017-10-02 NOTE — Progress Notes (Signed)
Office: [phone]  /  Fax: [phone]   HPI:   Chief Complaint: OBESITY Cynthia [name] is here to discuss her progress with her obesity treatment plan. She is on the keep a food journal with 1100-1200 calories and 70 grams of protein daily and is following her eating plan approximately 85-90 % of the time. She states she is exercising 0 minutes 0 times per week. [name] is really craving sweets and cakes. She has been doing physical therapy 2 times per week, able to take 3 steps without crutches.  Her weight is 261 lb (118.4 kg) today and has not lost weight since her last visit. She has lost 3 lbs since starting treatment with [place].  Vitamin D Deficiency [name] has a diagnosis of vitamin D deficiency. She is currently taking prescription Vit D. She still notes some fatigue and denies nausea, vomiting or muscle weakness.  At risk for osteopenia and osteoporosis [name] is at higher risk of osteopenia and osteoporosis due to vitamin D deficiency.   Insulin Resistance [name] has a diagnosis of insulin resistance based on her elevated fasting insulin level >5. Although [name]'s blood glucose readings are still under good control, insulin resistance puts her at greater risk of metabolic syndrome and diabetes. She had significant cravings recently for sweets. She is not taking metformin currently and continues to work on diet and exercise to decrease risk of diabetes. She denies polyuria or polydipsia.  ALLERGIES: Allergies  Allergen Reactions  . Aquacel [Carboxymethylcellulose] Itching, Rash and Other (See Comments)    Caused blisters  . Penicillins   . Oxycodone Itching and Rash    MEDICATIONS: Current Outpatient Medications on File Prior to Visit  Medication Sig Dispense Refill  . acetaminophen (TYLENOL) 500 MG tablet Take 1,000 mg by mouth every 6 (six) hours as needed.    Marland Kitchen albuterol (PROAIR HFA) 108 (90 Base) MCG/ACT inhaler Inhale 1-2 puffs into the lungs every 6 (six) hours as needed for  wheezing or shortness of breath.    Marland Kitchen aspirin 325 MG tablet Take 325 mg by mouth 2 (two) times daily.    Marland Kitchen desloratadine (CLARINEX) 5 MG tablet Take 1 tablet by mouth daily.  0  . doxycycline (VIBRAMYCIN) 100 MG capsule Take 100 mg by mouth daily.    . fluticasone furoate-vilanterol (BREO ELLIPTA) 200-25 MCG/INH AEPB Inhale 1 puff into the lungs daily.    Marland Kitchen HYDROcodone-acetaminophen (NORCO/VICODIN) 5-325 MG tablet Take 1 tablet by mouth every 6 (six) hours as needed for moderate pain.    . hyoscyamine (LEVSIN SL) 0.125 MG SL tablet Take 1 tablet by mouth as directed.  0  . ibuprofen (ADVIL,MOTRIN) 200 MG tablet Take 200 mg by mouth every 6 (six) hours as needed.    . methocarbamol (ROBAXIN) 500 MG tablet Take 500 mg by mouth 3 (three) times daily.    . Montelukast Sodium (SINGULAIR PO) Take by mouth.      . norethindrone-ethinyl estradiol (MICROGESTIN FE 1/20) 1-20 MG-MCG tablet Take 1 tablet by mouth daily. 3 Package 4  . olopatadine (PATANOL) 0.1 % ophthalmic solution 1 drop 2 (two) times daily.    Illa Level 80 MCG/ACT AERS as directed.  0  . traMADol (ULTRAM) 50 MG tablet as needed.  0  . traZODone (DESYREL) 100 MG tablet Take 100 mg by mouth at bedtime.      No current facility-administered medications on file prior to visit.     PAST MEDICAL HISTORY: Past Medical History:  Diagnosis Date  . ADHD   .  ADHD (attention deficit hyperactivity disorder)   . Allergic rhinitis   . Anxiety   . Asthma   . Back injury    COMPETITIVE INJURY IN HIGH SCHOOL  . Back pain   . Depression   . Fatty liver   . Fibromyalgia   . Gallbladder problem   . GERD (gastroesophageal reflux disease)   . IBS (irritable bowel syndrome)   . Joint pain   . Pneumonia     PAST SURGICAL HISTORY: Past Surgical History:  Procedure Laterality Date  . CHOLECYSTECTOMY    . CORONARY ARTERY BYPASS GRAFT    . DG DILATION URETERS    . SHOULDER SURGERY  2002   RIGHT  . WRIST ARTHROSCOPY     Right    SOCIAL  HISTORY: Social History   Tobacco Use  . Smoking status: Never Smoker  . Smokeless tobacco: Never Used  Substance Use Topics  . Alcohol use: Yes  . Drug use: No    FAMILY HISTORY: Family History  Problem Relation Age of Onset  . Multiple sclerosis Mother   . Diabetes Mother   . Hyperlipidemia Mother   . Thyroid disease Mother   . Depression Mother   . Anxiety disorder Mother   . Obesity Mother   . Hypertension Father   . Diabetes Father   . Hyperlipidemia Father   . Obesity Father   . Diabetes Maternal Grandfather   . Cancer Maternal Grandfather        prostate  . Heart disease Maternal Grandfather   . Heart disease Paternal Grandfather     ROS: Review of Systems  Constitutional: Positive for malaise/fatigue. Negative for weight loss.  Gastrointestinal: Negative for nausea and vomiting.  Genitourinary: Negative for frequency.  Musculoskeletal:       Negative muscle weakness  Endo/Heme/Allergies: Negative for polydipsia.    PHYSICAL EXAM: Blood pressure 135/78, pulse (!) 121, temperature 98.3 F (36.8 C), temperature source Oral, height 5\' 1"  (1.549 m), weight 261 lb (118.4 kg), SpO2 97 %. Body mass index is 49.32 kg/m. Physical Exam  Constitutional: She is oriented to person, place, and time. She appears well-developed and well-nourished.  Cardiovascular: Normal rate.  Pulmonary/Chest: Effort normal.  Musculoskeletal: Normal range of motion.  Neurological: She is oriented to person, place, and time.  Skin: Skin is warm and dry.  Psychiatric: She has a normal mood and affect. Her behavior is normal.  Vitals reviewed.   RECENT LABS AND TESTS: BMET    Component Value Date/Time   NA 139 05/01/2017 1009   K 4.6 05/01/2017 1009   CL 104 05/01/2017 1009   CO2 22 05/01/2017 1009   GLUCOSE 80 05/01/2017 1009   GLUCOSE 88 01/09/2012 0956   BUN 10 05/01/2017 1009   CREATININE 0.77 05/01/2017 1009   CALCIUM 9.1 05/01/2017 1009   GFRNONAA 103 05/01/2017 1009     GFRAA 119 05/01/2017 1009   Lab Results  Component Value Date   HGBA1C 5.3 05/01/2017   Lab Results  Component Value Date   INSULIN 11.1 05/01/2017   CBC    Component Value Date/Time   WBC 6.8 05/01/2017 1009   WBC 7.8 01/09/2012 0956   RBC 4.67 05/01/2017 1009   RBC 5.06 01/09/2012 0956   HGB 14.0 05/01/2017 1009   HCT 43.3 05/01/2017 1009   PLT 285 01/09/2012 0956   MCV 93 05/01/2017 1009   MCH 30.0 05/01/2017 1009   MCH 29.6 01/09/2012 0956   MCHC 32.3 05/01/2017 1009  MCHC 33.3 01/09/2012 0956   RDW 13.7 05/01/2017 1009   LYMPHSABS 1.5 05/01/2017 1009   MONOABS 0.7 01/09/2012 0956   EOSABS 0.1 05/01/2017 1009   BASOSABS 0.0 05/01/2017 1009   Iron/TIBC/Ferritin/ %Sat No results found for: IRON, TIBC, FERRITIN, IRONPCTSAT Lipid Panel     Component Value Date/Time   CHOL 149 05/01/2017 1009   TRIG 53 05/01/2017 1009   HDL 48 05/01/2017 1009   CHOLHDL 3.4 01/09/2012 0956   VLDL 14 01/09/2012 0956   LDLCALC 90 05/01/2017 1009   Hepatic Function Panel     Component Value Date/Time   PROT 6.8 05/01/2017 1009   ALBUMIN 4.2 05/01/2017 1009   AST 15 05/01/2017 1009   ALT 14 05/01/2017 1009   ALKPHOS 61 05/01/2017 1009   BILITOT 0.3 05/01/2017 1009      Component Value Date/Time   TSH 1.640 05/01/2017 1009   TSH 1.850 01/09/2012 0956  Results for Simonne MaffucciBAUMANN, Urania C (MRN [id number]) as of 10/02/2017 14:44  Ref. Range 05/01/2017 10:09  Vitamin D, 25-Hydroxy Latest Ref Range: 30.0 - 100.0 ng/mL 14.6 (L)    ASSESSMENT AND PLAN: Vitamin D deficiency - Plan: VITAMIN D 25 Hydroxy (Vit-D Deficiency, Fractures), Vitamin D, Ergocalciferol, (DRISDOL) 50000 units CAPS capsule  Insulin resistance - Plan: Hemoglobin A1c, Insulin, random, Lipid Panel With LDL/HDL Ratio  At risk for osteoporosis  Class 3 severe obesity with serious comorbidity and body mass index (BMI) of 45.0 to 49.9 in adult, unspecified obesity type (HCC)  PLAN:  Vitamin D Deficiency [name] was  informed that low vitamin D levels contributes to fatigue and are associated with obesity, breast, and colon cancer. [name] agrees to continue taking prescription Vit D @50 ,000 IU every week #4 and we will refill for 1 month. She will follow up for routine testing of vitamin D, at least 2-3 times per year. She was informed of the risk of over-replacement of vitamin D and agrees to not increase her dose unless she discusses this with us first. [name] agrees to follow up with our clinic in 2 weeks.  At risk for osteopenia and osteoporosis [name] is at risk for osteopenia and osteoporsis due to her vitamin D deficiency. She was encouraged to take her vitamin D and follow her higher calcium diet and increase strengthening exercise to help strengthen her bones and decrease her risk of osteopenia and osteoporosis.  Insulin Resistance [name] will continue to work on weight loss, exercise, and decreasing simple carbohydrates in her diet to help decrease the risk of diabetes. We dicussed metformin including benefits and risks. She was informed that eating too many simple carbohydrates or too many calories at one sitting increases the likelihood of GI side effects. Renella declined metformin for now and prescription was not written today. She will do Category 2 for the next 2 weeks. [name] agrees to follow up with our clinic in 2 weeks as directed to monitor her progress.  Obesity [name] is currently in the action stage of change. As such, her goal is to continue with weight loss efforts She has agreed to follow the Category 2 plan [name] has been instructed to work up to a goal of 150 minutes of combined cardio and strengthening exercise per week for weight loss and overall health benefits. We discussed the following Behavioral Modification Strategies today: increasing lean protein intake, work on meal planning and easy cooking plans, better snacking choices, and planning for success   [name] has agreed to follow up  with our clinic  in 2 weeks. She was informed of the importance of frequent follow up visits to maximize her success with intensive lifestyle modifications for her multiple health conditions.   OBESITY BEHAVIORAL INTERVENTION VISIT  Today's visit was # 9 out of 22.  Starting weight: 264 lbs Starting date: 05/01/17 Today's weight : 261 lbs Today's date: 10/02/2017 Total lbs lost to date: 3 (Patients must lose 7 lbs in the first 6 months to continue with counseling)   ASK: We discussed the diagnosis of obesity with [name] today and Yamilka agreed to give [place] permission to discuss obesity behavioral modification therapy today.  ASSESS: Paislie has the diagnosis of obesity and her BMI today is 49.34 Derrika is in the action stage of change   ADVISE: Adoria was educated on the multiple health risks of obesity as well as the benefit of weight loss to improve her health. She was advised of the need for long term treatment and the importance of lifestyle modifications.  AGREE: Multiple dietary modification options and treatment options were discussed and  Porschea agreed to the above obesity treatment plan.  I, Burt Knack, am acting as transcriptionist for Debbra Riding, MD  I have reviewed the above documentation for accuracy and completeness, and I agree with the above. - Debbra Riding, MD

## 2017-10-04 LAB — LIPID PANEL WITH LDL/HDL RATIO
Cholesterol, Total: 172 mg/dL (ref 100–199)
HDL: 53 mg/dL (ref >39)
LDL Calculated: 104 mg/dL — ABNORMAL HIGH (ref 0–99)
LDl/HDL Ratio: 2 ratio (ref 0.0–3.2)
Triglycerides: 73 mg/dL (ref 0–149)
VLDL Cholesterol Cal: 15 mg/dL (ref 5–40)

## 2017-10-04 LAB — VITAMIN D 25 HYDROXY (VIT D DEFICIENCY, FRACTURES): Vit D, 25-Hydroxy: 27.8 ng/mL — ABNORMAL LOW (ref 30.0–100.0)

## 2017-10-04 LAB — INSULIN, RANDOM: INSULIN: 21.7 u[IU]/mL (ref 2.6–24.9)

## 2017-10-04 LAB — HEMOGLOBIN A1C
Est. average glucose Bld gHb Est-mCnc: 108 mg/dL
Hgb A1c MFr Bld: 5.4 % (ref 4.8–5.6)

## 2017-10-14 ENCOUNTER — Ambulatory Visit (HOSPITAL_COMMUNITY)
Admission: RE | Admit: 2017-10-14 | Discharge: 2017-10-14 | Disposition: A | Discharge status: Home / Self Care | Payer: 59 | Source: Ambulatory Visit | Attending: Cardiovascular Disease | Admitting: Cardiovascular Disease

## 2017-10-14 ENCOUNTER — Other Ambulatory Visit (HOSPITAL_COMMUNITY): Payer: Self-pay | Admitting: Specialist

## 2017-10-14 DIAGNOSIS — Z4789 Encounter for other orthopedic aftercare: Secondary | ICD-10-CM | POA: Diagnosis present

## 2017-10-14 DIAGNOSIS — M79605 Pain in left leg: Secondary | ICD-10-CM | POA: Diagnosis not present

## 2017-10-17 ENCOUNTER — Ambulatory Visit (INDEPENDENT_AMBULATORY_CARE_PROVIDER_SITE_OTHER): Payer: 59 | Admitting: Family Medicine

## 2017-10-17 VITALS — BP 116/79 | HR 101 | Temp 98.2°F | Ht 61.0 in | Wt 262.0 lb

## 2017-10-17 DIAGNOSIS — E559 Vitamin D deficiency, unspecified: Secondary | ICD-10-CM

## 2017-10-17 DIAGNOSIS — E8881 Metabolic syndrome: Secondary | ICD-10-CM | POA: Diagnosis not present

## 2017-10-17 DIAGNOSIS — E66813 Obesity, class 3: Secondary | ICD-10-CM

## 2017-10-17 DIAGNOSIS — E88819 Insulin resistance, unspecified: Secondary | ICD-10-CM

## 2017-10-17 DIAGNOSIS — Z9189 Other specified personal risk factors, not elsewhere classified: Secondary | ICD-10-CM | POA: Diagnosis not present

## 2017-10-17 DIAGNOSIS — Z6841 Body Mass Index (BMI) 40.0 and over, adult: Secondary | ICD-10-CM

## 2017-10-17 MED ORDER — METFORMIN HCL 500 MG PO TABS: 500.0000 mg | ORAL_TABLET | Freq: Every day | ORAL | 0 refills | Status: DC

## 2017-10-17 NOTE — Progress Notes (Signed)
Office: [phone]  /  Fax: [phone]   HPI:   Chief Complaint: OBESITY Cynthia [name] is here to discuss her progress with her obesity treatment plan. She is on the Category 2 plan and is following her eating plan approximately 65 to 70 % of the time. She states she is exercising 0 minutes 0 times per week. Cynthia [name] had recent stressors involving DVT scare. She is trying to get more consistent with eating and making better food choices/ She is avoiding meat on Fridays for lent. Her weight is 262 lb (118.8 kg) today and has had a weight loss of 1 pound over a period of 2 weeks since her last visit. She has lost 2 lbs since starting treatment with Cynthia [name].  Insulin Resistance Cynthia [name] has a diagnosis of insulin resistance based on her elevated fasting insulin [name] of 21.7 Although Cynthia [name]'s blood glucose readings are still under good control, insulin resistance puts her at greater risk of metabolic syndrome and diabetes. Her last A1c was at 5.4 She is not taking [name] currently and continues to work on diet and exercise to decrease risk of diabetes.  At risk for diabetes Cynthia [name] is at higher than average risk for developing diabetes due to her obesity and insulin resistance.. She currently denies polyuria or polydipsia.  Vitamin D deficiency Cynthia [name] has a diagnosis of vitamin D deficiency. She has a vitamin D [name] of 27.8 and she is currently taking vit D and denies nausea, vomiting or muscle weakness.  ALLERGIES: Allergies  Allergen Reactions  . Aquacel [Carboxymethylcellulose] Itching, Rash and Other (See Comments)    Caused blisters  . Penicillins   . Oxycodone Itching and Rash    MEDICATIONS: Current Outpatient Medications on File Prior to Visit  Medication Sig Dispense Refill  . acetaminophen (TYLENOL) 500 MG tablet Take 1,000 mg by mouth every 6 (six) hours as needed.    Cynthia [name] Kitchen albuterol (PROAIR HFA) 108 (90 Base) MCG/ACT inhaler Inhale 1-2 puffs into the lungs every 6 (six) hours as needed for  wheezing or shortness of breath.    Cynthia [name] Kitchen aspirin 325 MG tablet Take 325 mg by mouth 2 (two) times daily.    Cynthia [name] Kitchen desloratadine (CLARINEX) 5 MG tablet Take 1 tablet by mouth daily.  0  . fluticasone furoate-vilanterol (BREO ELLIPTA) 200-25 MCG/INH AEPB Inhale 1 puff into the lungs daily.    Cynthia [name] Kitchen HYDROcodone-acetaminophen (NORCO/VICODIN) 5-325 MG tablet Take 1 tablet by mouth every 6 (six) hours as needed for moderate pain.    . hyoscyamine (LEVSIN SL) 0.125 MG SL tablet Take 1 tablet by mouth as directed.  0  . ibuprofen (ADVIL,MOTRIN) 200 MG tablet Take 200 mg by mouth every 6 (six) hours as needed.    . methocarbamol (ROBAXIN) 500 MG tablet Take 500 mg by mouth 3 (three) times daily.    . Montelukast Sodium (SINGULAIR PO) Take by mouth.      . norethindrone-ethinyl estradiol (MICROGESTIN FE 1/20) 1-20 MG-MCG tablet Take 1 tablet by mouth daily. 3 Package 4  . olopatadine (PATANOL) 0.1 % ophthalmic solution 1 drop 2 (two) times daily.    Cynthia [name] as directed.  0  . traMADol (ULTRAM) 50 MG tablet as needed.  0  . traZODone (DESYREL) 100 MG tablet Take 100 mg by mouth at bedtime.     . Vitamin D, Ergocalciferol, (DRISDOL) 50000 units CAPS capsule Take 1 capsule (50,000 Units total) by mouth every 7 (seven) days. 4 capsule 0   No current facility-administered medications on  file prior to visit.     PAST MEDICAL HISTORY: Past Medical History:  Diagnosis Date  . ADHD   . ADHD (attention deficit hyperactivity disorder)   . Allergic rhinitis   . Anxiety   . Asthma   . Back injury    COMPETITIVE INJURY IN HIGH SCHOOL  . Back pain   . Depression   . Fatty liver   . Fibromyalgia   . Gallbladder problem   . GERD (gastroesophageal reflux disease)   . IBS (irritable bowel syndrome)   . Joint pain   . Pneumonia     PAST SURGICAL HISTORY: Past Surgical History:  Procedure Laterality Date  . CHOLECYSTECTOMY    . CORONARY ARTERY BYPASS GRAFT    . DG DILATION URETERS    . SHOULDER  SURGERY  2002   RIGHT  . WRIST ARTHROSCOPY     Right    SOCIAL HISTORY: Social History   Tobacco Use  . Smoking status: Never Smoker  . Smokeless tobacco: Never Used  Substance Use Topics  . Alcohol use: Yes  . Drug use: No    FAMILY HISTORY: Family History  Problem Relation Age of Onset  . Multiple sclerosis Mother   . Diabetes Mother   . Hyperlipidemia Mother   . Thyroid disease Mother   . Depression Mother   . Anxiety disorder Mother   . Obesity Mother   . Hypertension Father   . Diabetes Father   . Hyperlipidemia Father   . Obesity Father   . Diabetes Maternal Grandfather   . Cancer Maternal Grandfather        prostate  . Heart disease Maternal Grandfather   . Heart disease Paternal Grandfather     ROS: Review of Systems  Constitutional: Positive for weight loss.  Gastrointestinal: Negative for nausea and vomiting.  Genitourinary: Negative for frequency.  Musculoskeletal:       Negative for muscle weakness  Endo/Heme/Allergies: Negative for polydipsia.    PHYSICAL EXAM: Blood pressure 116/79, pulse (!) 101, temperature 98.2 F (36.8 C), temperature source Oral, height 5\' 1"  (1.549 m), weight 262 lb (118.8 kg), SpO2 97 %. Body mass index is 49.5 kg/m. Physical Exam  Constitutional: She is oriented to person, place, and time. She appears well-developed and well-nourished.  Cardiovascular: Normal rate.  Pulmonary/Chest: Effort normal.  Musculoskeletal: Normal range of motion.  Neurological: She is oriented to person, place, and time.  Skin: Skin is warm and dry.  Psychiatric: She has a normal mood and affect. Her behavior is normal.  Vitals reviewed.   RECENT LABS AND TESTS: BMET    Component Value Date/Time   NA 139 05/01/2017 1009   K 4.6 05/01/2017 1009   CL 104 05/01/2017 1009   CO2 22 05/01/2017 1009   GLUCOSE [name] 05/01/2017 1009   GLUCOSE 88 01/09/2012 0956   BUN 10 05/01/2017 1009   CREATININE 0.77 05/01/2017 1009   CALCIUM 9.1  05/01/2017 1009   GFRNONAA 103 05/01/2017 1009   GFRAA 119 05/01/2017 1009   Lab Results  Component Value Date   HGBA1C 5.4 10/03/2017   HGBA1C 5.3 05/01/2017   Lab Results  Component Value Date   INSULIN 21.7 10/03/2017   INSULIN 11.1 05/01/2017   CBC    Component Value Date/Time   WBC 6.8 05/01/2017 1009   WBC 7.8 01/09/2012 0956   RBC 4.67 05/01/2017 1009   RBC 5.06 01/09/2012 0956   HGB 14.0 05/01/2017 1009   HCT 43.3 05/01/2017 1009  PLT 285 01/09/2012 0956   MCV 93 05/01/2017 1009   MCH 30.0 05/01/2017 1009   MCH 29.6 01/09/2012 0956   MCHC 32.3 05/01/2017 1009   MCHC 33.3 01/09/2012 0956   RDW 13.7 05/01/2017 1009   LYMPHSABS 1.5 05/01/2017 1009   MONOABS 0.7 01/09/2012 0956   EOSABS 0.1 05/01/2017 1009   BASOSABS 0.0 05/01/2017 1009   Iron/TIBC/Ferritin/ %Sat No results found for: IRON, TIBC, FERRITIN, IRONPCTSAT Lipid Panel     Component Value Date/Time   CHOL 172 10/03/2017 0821   TRIG 73 10/03/2017 0821   HDL 53 10/03/2017 0821   CHOLHDL 3.4 01/09/2012 0956   VLDL 14 01/09/2012 0956   LDLCALC 104 (H) 10/03/2017 0821   Hepatic Function Panel     Component Value Date/Time   PROT 6.8 05/01/2017 1009   ALBUMIN 4.2 05/01/2017 1009   AST 15 05/01/2017 1009   ALT 14 05/01/2017 1009   ALKPHOS 61 05/01/2017 1009   BILITOT 0.3 05/01/2017 1009      Component Value Date/Time   TSH 1.640 05/01/2017 1009   TSH 1.850 01/09/2012 0956   Results for ABRA, LINGENFELTER (MRN [id number]) as of 10/17/2017 10:03  Ref. Range 10/03/2017 08:21  Vitamin D, 25-Hydroxy Latest Ref Range: 30.0 - 100.0 ng/mL 27.8 (L)   ASSESSMENT AND PLAN: Insulin resistance - Plan: [name] (GLUCOPHAGE) 500 MG tablet  Vitamin D deficiency  At risk for diabetes mellitus  Class 3 severe obesity with serious comorbidity and body mass index (BMI) of 45.0 to 49.9 in adult, unspecified obesity type (HCC)  PLAN:  Insulin Resistance Cynthia [name] will continue to work on weight loss, exercise,  and decreasing simple carbohydrates in her diet to help decrease the risk of diabetes. We dicussed [name] including benefits and risks. She was informed that eating too many simple carbohydrates or too many calories at one sitting increases the likelihood of GI side effects. Cynthia [name] 500 mg by mouth qAM #30 with no refills and follow up with Cynthia [name] as directed to monitor her progress.  Diabetes risk counseling Cynthia [name] was given extended (15 minutes) diabetes prevention counseling today. She is 32 y.o. female and has risk factors for diabetes including obesity and insulin resistance. We discussed intensive lifestyle modifications today with an emphasis on weight loss as well as increasing exercise and decreasing simple carbohydrates in her diet.  Vitamin D Deficiency Cynthia [name] was informed that low vitamin D levels contributes to fatigue and are associated with obesity, breast, and colon cancer. She [name] to continue to take prescription Vit D @50 ,000 IU every week and will follow up for routine testing of vitamin D, at least 2-3 times per year. She was informed of the risk of over-replacement of vitamin D and [name] to not increase her dose unless she discusses this with Cynthia [name] first.  Obesity Cynthia [name] is currently in the action stage of change. As such, her goal is to continue with weight loss efforts She has agreed to follow the Category 2 plan and follow the Pescatarian eating plan on fridays Nakya has been instructed to work up to a goal of 150 minutes of combined cardio and strengthening exercise per week for weight loss and overall health benefits. We discussed the following Behavioral Modification Strategies today: keeping healthy foods in the home, keep a strict food journal, planning for success, increasing lean protein intake and work on meal planning and easy cooking plans  Brookelle has agreed to follow up with our clinic in 2 weeks. She  was informed of the importance of frequent  follow up visits to maximize her success with intensive lifestyle modifications for her multiple health conditions.   OBESITY BEHAVIORAL INTERVENTION VISIT  Today's visit was # 10 out of 22.  Starting weight: 264 lbs Starting date: 05/01/17 Today's weight : 262 lbs  Today's date: 10/17/2017 Total lbs lost to date: 2 (Patients must lose 7 lbs in the first 6 months to continue with counseling)   ASK: We discussed the diagnosis of obesity with Simonne MaffucciSarah C Jose today and Zosia agreed to give us permission to discuss obesity behavioral modification therapy today.  ASSESS: [name] has the diagnosis of obesity and her BMI today is 49.53 [name] is in the action stage of change   ADVISE: [name] was educated on the multiple health risks of obesity as well as the benefit of weight loss to improve her health. She was advised of the need for long term treatment and the importance of lifestyle modifications.  AGREE: Multiple dietary modification options and treatment options were discussed and  [name] agreed to the above obesity treatment plan.  I, [place] CraneJoanne Murray, am acting as transcriptionist for Filbert SchilderAlexandria U. Kadolph, MD  I have reviewed the above documentation for accuracy and completeness, and I agree with the above. - Debbra RidingAlexandria Kadolph, MD

## 2017-10-30 ENCOUNTER — Ambulatory Visit (INDEPENDENT_AMBULATORY_CARE_PROVIDER_SITE_OTHER): Payer: 59 | Admitting: Family Medicine

## 2017-11-11 ENCOUNTER — Ambulatory Visit (INDEPENDENT_AMBULATORY_CARE_PROVIDER_SITE_OTHER): Payer: 59 | Admitting: Family Medicine

## 2017-11-11 VITALS — BP 122/76 | HR 103 | Temp 98.2°F | Ht 61.0 in | Wt 267.0 lb

## 2017-11-11 DIAGNOSIS — Z6841 Body Mass Index (BMI) 40.0 and over, adult: Secondary | ICD-10-CM | POA: Diagnosis not present

## 2017-11-11 DIAGNOSIS — E559 Vitamin D deficiency, unspecified: Secondary | ICD-10-CM

## 2017-11-11 DIAGNOSIS — Z9189 Other specified personal risk factors, not elsewhere classified: Secondary | ICD-10-CM

## 2017-11-11 MED ORDER — VITAMIN D (ERGOCALCIFEROL) 1.25 MG (50000 UNIT) PO CAPS: 50000.0000 [IU] | ORAL_CAPSULE | ORAL | 0 refills | Status: DC

## 2017-11-12 NOTE — Progress Notes (Signed)
Office: [phone]  /  Fax: [phone]   HPI:   Chief Complaint: OBESITY Cynthia [name] is here to discuss her progress with her obesity treatment plan. She is on the Category 2 plan and the Pescatarian eating plan on Friday's and is following her eating plan approximately 40 % of the time. She states she is doing strength exercises and riding her bike for 30 to 45 minutes 2 to 3 times per week. Cynthia [name] has been struggling with work and recovering from knee surgery and has not been concentrating on weight loss. She is not really in the action stage of change.  Her weight is 267 lb (121.1 kg) today and has had a weight gain of 5 pounds over a period of 3 weeks since her last visit. She has gained 3 lbs since starting treatment with [place].  Vitamin D deficiency Cynthia [name] has a diagnosis of vitamin D deficiency. She is stable on vit D but is not yet at goal. Cynthia [name] still notes fatigue and denies nausea, vomiting or muscle weakness.  At risk for diabetes Cynthia [name] is at higher than average risk for developing diabetes due to her obesity. She currently denies polyuria or polydipsia.  ALLERGIES: Allergies  Allergen Reactions  . Aquacel [Carboxymethylcellulose] Itching, Rash and Other (See Comments)    Caused blisters  . Penicillins   . Oxycodone Itching and Rash    MEDICATIONS: Current Outpatient Medications on File Prior to Visit  Medication Sig Dispense Refill  . acetaminophen (TYLENOL) 500 MG tablet Take 1,000 mg by mouth every 6 (six) hours as needed.    Marland Kitchen albuterol (PROAIR HFA) 108 (90 Base) MCG/ACT inhaler Inhale 1-2 puffs into the lungs every 6 (six) hours as needed for wheezing or shortness of breath.    . desloratadine (CLARINEX) 5 MG tablet Take 1 tablet by mouth daily.  0  . fluticasone furoate-vilanterol (BREO ELLIPTA) 200-25 MCG/INH AEPB Inhale 1 puff into the lungs daily.    Marland Kitchen HYDROcodone-acetaminophen (NORCO/VICODIN) 5-325 MG tablet Take 1 tablet by mouth every 6 (six) hours as needed for  moderate pain.    . hyoscyamine (LEVSIN SL) 0.125 MG SL tablet Take 1 tablet by mouth as directed.  0  . ibuprofen (ADVIL,MOTRIN) 200 MG tablet Take 200 mg by mouth every 6 (six) hours as needed.    . metFORMIN (GLUCOPHAGE) 500 MG tablet Take 1 tablet (500 mg total) by mouth daily with breakfast. 30 tablet 0  . methocarbamol (ROBAXIN) 500 MG tablet Take 500 mg by mouth 3 (three) times daily.    . Montelukast Sodium (SINGULAIR PO) Take by mouth.      . norethindrone-ethinyl estradiol (MICROGESTIN FE 1/20) 1-20 MG-MCG tablet Take 1 tablet by mouth daily. 3 Package 4  . olopatadine (PATANOL) 0.1 % ophthalmic solution 1 drop 2 (two) times daily.    Cynthia [name] 80 MCG/ACT AERS as directed.  0  . traMADol (ULTRAM) 50 MG tablet as needed.  0  . traZODone (DESYREL) 100 MG tablet Take 100 mg by mouth at bedtime.      No current facility-administered medications on file prior to visit.     PAST MEDICAL HISTORY: Past Medical History:  Diagnosis Date  . ADHD   . ADHD (attention deficit hyperactivity disorder)   . Allergic rhinitis   . Anxiety   . Asthma   . Back injury    COMPETITIVE INJURY IN HIGH SCHOOL  . Back pain   . Depression   . Fatty liver   . Fibromyalgia   .  Gallbladder problem   . GERD (gastroesophageal reflux disease)   . IBS (irritable bowel syndrome)   . Joint pain   . Pneumonia     PAST SURGICAL HISTORY: Past Surgical History:  Procedure Laterality Date  . CHOLECYSTECTOMY    . CORONARY ARTERY BYPASS GRAFT    . DG DILATION URETERS    . SHOULDER SURGERY  2002   RIGHT  . WRIST ARTHROSCOPY     Right    SOCIAL HISTORY: Social History   Tobacco Use  . Smoking status: Never Smoker  . Smokeless tobacco: Never Used  Substance Use Topics  . Alcohol use: Yes  . Drug use: No    FAMILY HISTORY: Family History  Problem Relation Age of Onset  . Multiple sclerosis Mother   . Diabetes Mother   . Hyperlipidemia Mother   . Thyroid disease Mother   . Depression Mother     . Anxiety disorder Mother   . Obesity Mother   . Hypertension Father   . Diabetes Father   . Hyperlipidemia Father   . Obesity Father   . Diabetes Maternal Grandfather   . Cancer Maternal Grandfather        prostate  . Heart disease Maternal Grandfather   . Heart disease Paternal Grandfather     ROS: Review of Systems  Constitutional: Positive for malaise/fatigue. Negative for weight loss.  Gastrointestinal: Negative for nausea and vomiting.  Genitourinary: Negative for frequency.  Musculoskeletal:       Negative for muscle weakness  Endo/Heme/Allergies: Negative for polydipsia.    PHYSICAL EXAM: Blood pressure 122/76, pulse (!) 103, temperature 98.2 F (36.8 C), height 5\' 1"  (1.549 m), weight 267 lb (121.1 kg), SpO2 98 %. Body mass index is 50.45 kg/m. Physical Exam  Constitutional: She is oriented to person, place, and time. She appears well-developed and well-nourished.  Cardiovascular: Normal rate.  Pulmonary/Chest: Effort normal.  Musculoskeletal: Normal range of motion.  Neurological: She is oriented to person, place, and time.  Skin: Skin is warm and dry.  Psychiatric: She has a normal mood and affect. Her behavior is normal.  Vitals reviewed.   RECENT LABS AND TESTS: BMET    Component Value Date/Time   NA 139 05/01/2017 1009   K 4.6 05/01/2017 1009   CL 104 05/01/2017 1009   CO2 22 05/01/2017 1009   GLUCOSE 80 05/01/2017 1009   GLUCOSE 88 01/09/2012 0956   BUN 10 05/01/2017 1009   CREATININE 0.77 05/01/2017 1009   CALCIUM 9.1 05/01/2017 1009   GFRNONAA 103 05/01/2017 1009   GFRAA 119 05/01/2017 1009   Lab Results  Component Value Date   HGBA1C 5.4 10/03/2017   HGBA1C 5.3 05/01/2017   Lab Results  Component Value Date   INSULIN 21.7 10/03/2017   INSULIN 11.1 05/01/2017   CBC    Component Value Date/Time   WBC 6.8 05/01/2017 1009   WBC 7.8 01/09/2012 0956   RBC 4.67 05/01/2017 1009   RBC 5.06 01/09/2012 0956   HGB 14.0 05/01/2017 1009    HCT 43.3 05/01/2017 1009   PLT 285 01/09/2012 0956   MCV 93 05/01/2017 1009   MCH 30.0 05/01/2017 1009   MCH 29.6 01/09/2012 0956   MCHC 32.3 05/01/2017 1009   MCHC 33.3 01/09/2012 0956   RDW 13.7 05/01/2017 1009   LYMPHSABS 1.5 05/01/2017 1009   MONOABS 0.7 01/09/2012 0956   EOSABS 0.1 05/01/2017 1009   BASOSABS 0.0 05/01/2017 1009   Iron/TIBC/Ferritin/ %Sat No results found for: IRON,  TIBC, FERRITIN, IRONPCTSAT Lipid Panel     Component Value Date/Time   CHOL 172 10/03/2017 0821   TRIG 73 10/03/2017 0821   HDL 53 10/03/2017 0821   CHOLHDL 3.4 01/09/2012 0956   VLDL 14 01/09/2012 0956   LDLCALC 104 (H) 10/03/2017 0821   Hepatic Function Panel     Component Value Date/Time   PROT 6.8 05/01/2017 1009   ALBUMIN 4.2 05/01/2017 1009   AST 15 05/01/2017 1009   ALT 14 05/01/2017 1009   ALKPHOS 61 05/01/2017 1009   BILITOT 0.3 05/01/2017 1009      Component Value Date/Time   TSH 1.640 05/01/2017 1009   TSH 1.850 01/09/2012 0956   Results for MARIGRACE, MCCOLE (MRN [id number]) as of 11/12/2017 09:23  Ref. Range 10/03/2017 08:21  Vitamin D, 25-Hydroxy Latest Ref Range: 30.0 - 100.0 ng/mL 27.8 (L)   ASSESSMENT AND PLAN: Vitamin D deficiency - Plan: Vitamin D, Ergocalciferol, (DRISDOL) 50000 units CAPS capsule  At risk for diabetes mellitus  Class 3 severe obesity with serious comorbidity and body mass index (BMI) of 50.0 to 59.9 in adult, unspecified obesity type (HCC)  PLAN:  Vitamin D Deficiency Maragret was informed that low vitamin D levels contributes to fatigue and are associated with obesity, breast, and colon cancer. She agrees to continue to take prescription Vit D @50 ,000 IU every week #4 with no refills. We will recheck labs at her next visit (1 month) and she will follow up for routine testing of vitamin D, at least 2-3 times per year. She was informed of the risk of over-replacement of vitamin D and agrees to not increase her dose unless she discusses this with [place]  first. Cynthia [name] agreed to follow up as directed.  Diabetes risk counseling Cynthia [name] was given extended (15 minutes) diabetes prevention counseling today. She is 32 y.o. female and has risk factors for diabetes including obesity. We discussed intensive lifestyle modifications today with an emphasis on weight loss as well as increasing exercise and decreasing simple carbohydrates in her diet.  Obesity Cynthia [name] is not currently in the action stage of change. As such, her goal is to maintain weight for now She has agreed to change to the Category 3 plan Cynthia [name] has been instructed to work up to a goal of 150 minutes of combined cardio and strengthening exercise per week for weight loss and overall health benefits. We discussed the following Behavioral Modification Strategies today: increasing lean protein intake and decreasing simple carbohydrates  We discussed ways to help maintain weight until she is ready to get back to weight loss efforts.  Cynthia [name] has agreed to follow up with our clinic in 4 to 5 weeks. She was informed of the importance of frequent follow up visits to maximize her success with intensive lifestyle modifications for her multiple health conditions.   OBESITY BEHAVIORAL INTERVENTION VISIT  Today's visit was # 11 out of 22.  Starting weight: 264 lbs Starting date: 05/01/17 Today's weight : 267 lbs Today's date: 11/11/2017 Total lbs lost to date: 0 (Patients must lose 7 lbs in the first 6 months to continue with counseling)   ASK: We discussed the diagnosis of obesity with Cynthia [name] today and Cynthia [name] agreed to give [place] permission to discuss obesity behavioral modification therapy today.  ASSESS: Cynthia [name] has the diagnosis of obesity and her BMI today is 50.48 Cynthia [name] is not in the action stage of change   ADVISE: Cynthia [name] was educated on the multiple health risks of obesity as well  as the benefit of weight loss to improve her health. She was advised of the need for long term treatment  and the importance of lifestyle modifications.  AGREE: Multiple dietary modification options and treatment options were discussed and  Cynthia [name] agreed to the above obesity treatment plan.  I, [place] Crane, am acting as transcriptionist for Quillian Quince, MD  I have reviewed the above documentation for accuracy and completeness, and I agree with the above. -Quillian Quince, MD

## 2017-12-04 ENCOUNTER — Ambulatory Visit (INDEPENDENT_AMBULATORY_CARE_PROVIDER_SITE_OTHER): Payer: 59 | Admitting: Family Medicine

## 2018-01-01 ENCOUNTER — Ambulatory Visit (INDEPENDENT_AMBULATORY_CARE_PROVIDER_SITE_OTHER): Payer: 59 | Admitting: Family Medicine

## 2018-01-01 VITALS — BP 131/81 | HR 99 | Temp 98.2°F | Ht 61.0 in | Wt 273.0 lb

## 2018-01-01 DIAGNOSIS — E66813 Obesity, class 3: Secondary | ICD-10-CM

## 2018-01-01 DIAGNOSIS — E8881 Metabolic syndrome: Secondary | ICD-10-CM | POA: Diagnosis not present

## 2018-01-01 DIAGNOSIS — Z9189 Other specified personal risk factors, not elsewhere classified: Secondary | ICD-10-CM | POA: Diagnosis not present

## 2018-01-01 DIAGNOSIS — Z6841 Body Mass Index (BMI) 40.0 and over, adult: Secondary | ICD-10-CM | POA: Diagnosis not present

## 2018-01-01 DIAGNOSIS — E88819 Insulin resistance, unspecified: Secondary | ICD-10-CM

## 2018-01-01 DIAGNOSIS — E559 Vitamin D deficiency, unspecified: Secondary | ICD-10-CM | POA: Diagnosis not present

## 2018-01-01 MED ORDER — VITAMIN D (ERGOCALCIFEROL) 1.25 MG (50000 UNIT) PO CAPS: 50000.0000 [IU] | ORAL_CAPSULE | ORAL | 0 refills | Status: DC

## 2018-01-01 MED ORDER — METFORMIN HCL 500 MG PO TABS: 500.0000 mg | ORAL_TABLET | Freq: Every day | ORAL | 0 refills | Status: DC

## 2018-01-02 NOTE — Progress Notes (Signed)
Office: [phone]  /  Fax: [phone]   HPI:   Chief Complaint: OBESITY Cynthia [name] is here to discuss her progress with her obesity treatment plan. She is on the Category 3 plan and is following her eating plan approximately 80 % of the time. She states she is doing cardio for 30 minutes 4 times per week. Cynthia [name] finds herself needing significant carbohydrates to avoid shakes. She is frustrated with the lack of weight loss following the meal plan. Her weight is 273 lb (123.8 kg) today and has had a weight gain of 6 pounds over a period of 7 weeks since her last visit. She has gained 9 lbs since starting treatment with Cynthia [name].  Vitamin D deficiency Cynthia [name] has a diagnosis of vitamin D deficiency. Cynthia [name] is currently taking vit D. She admits fatigue and denies nausea, vomiting or muscle weakness.  Insulin Resistance Cynthia [name] has a diagnosis of insulin resistance based on her elevated fasting insulin [name] >5. Although Cynthia [name] blood glucose readings are still under good control, insulin resistance puts her at greater risk of metabolic syndrome and diabetes. She is struggling with additional carbohydrate intake. She is taking metformin currently and continues to work on diet and exercise to decrease risk of diabetes.  At risk for diabetes Cynthia [name] is at higher than average risk for developing diabetes due to her obesity and insulin resistance. She currently denies polyuria or polydipsia.  ALLERGIES: Allergies  Allergen Reactions  . Aquacel [Carboxymethylcellulose] Itching, Rash and Other (See Comments)    Caused blisters  . Penicillins   . Oxycodone Itching and Rash    MEDICATIONS: Current Outpatient Medications on File Prior to Visit  Medication Sig Dispense Refill  . acetaminophen (TYLENOL) 500 MG tablet Take 1,000 mg by mouth every 6 (six) hours as needed.    Marland Kitchen albuterol (PROAIR HFA) 108 (90 Base) MCG/ACT inhaler Inhale 1-2 puffs into the lungs every 6 (six) hours as needed for wheezing or  shortness of breath.    . desloratadine (CLARINEX) 5 MG tablet Take 1 tablet by mouth daily.  0  . fluticasone furoate-vilanterol (BREO ELLIPTA) 200-25 MCG/INH AEPB Inhale 1 puff into the lungs daily.    Marland Kitchen HYDROcodone-acetaminophen (NORCO/VICODIN) 5-325 MG tablet Take 1 tablet by mouth every 6 (six) hours as needed for moderate pain.    . hyoscyamine (LEVSIN SL) 0.125 MG SL tablet Take 1 tablet by mouth as directed.  0  . ibuprofen (ADVIL,MOTRIN) 200 MG tablet Take 200 mg by mouth every 6 (six) hours as needed.    . methocarbamol (ROBAXIN) 500 MG tablet Take 500 mg by mouth 3 (three) times daily.    . Montelukast Sodium (SINGULAIR PO) Take by mouth.      . norethindrone-ethinyl estradiol (MICROGESTIN FE 1/20) 1-20 MG-MCG tablet Take 1 tablet by mouth daily. 3 Package 4  . olopatadine (PATANOL) 0.1 % ophthalmic solution 1 drop 2 (two) times daily.    Cynthia [name] 80 MCG/ACT AERS as directed.  0  . traMADol (ULTRAM) 50 MG tablet as needed.  0  . traZODone (DESYREL) 100 MG tablet Take 100 mg by mouth at bedtime.      No current facility-administered medications on file prior to visit.     PAST MEDICAL HISTORY: Past Medical History:  Diagnosis Date  . ADHD   . ADHD (attention deficit hyperactivity disorder)   . Allergic rhinitis   . Anxiety   . Asthma   . Back injury    COMPETITIVE INJURY IN HIGH SCHOOL  .  Back pain   . Depression   . Fatty liver   . Fibromyalgia   . Gallbladder problem   . GERD (gastroesophageal reflux disease)   . IBS (irritable bowel syndrome)   . Joint pain   . Pneumonia     PAST SURGICAL HISTORY: Past Surgical History:  Procedure Laterality Date  . CHOLECYSTECTOMY    . CORONARY ARTERY BYPASS GRAFT    . DG DILATION URETERS    . SHOULDER SURGERY  2002   RIGHT  . WRIST ARTHROSCOPY     Right    SOCIAL HISTORY: Social History   Tobacco Use  . Smoking status: Never Smoker  . Smokeless tobacco: Never Used  Substance Use Topics  . Alcohol use: Yes  .  Drug use: No    FAMILY HISTORY: Family History  Problem Relation Age of Onset  . Multiple sclerosis Mother   . Diabetes Mother   . Hyperlipidemia Mother   . Thyroid disease Mother   . Depression Mother   . Anxiety disorder Mother   . Obesity Mother   . Hypertension Father   . Diabetes Father   . Hyperlipidemia Father   . Obesity Father   . Diabetes Maternal Grandfather   . Cancer Maternal Grandfather        prostate  . Heart disease Maternal Grandfather   . Heart disease Paternal Grandfather     ROS: Review of Systems  Constitutional: Positive for malaise/fatigue. Negative for weight loss.  Gastrointestinal: Negative for nausea and vomiting.  Genitourinary: Negative for frequency.  Musculoskeletal:       Negative for muscle weakness  Endo/Heme/Allergies: Negative for polydipsia.    PHYSICAL EXAM: Blood pressure 131/81, pulse 99, temperature 98.2 F (36.8 C), temperature source Oral, height 5\' 1"  (1.549 m), weight 273 lb (123.8 kg), SpO2 99 %. Body mass index is 51.58 kg/m. Physical Exam  Constitutional: She is oriented to person, place, and time. She appears well-developed and well-nourished.  Cardiovascular: Normal rate.  Pulmonary/Chest: Effort normal.  Musculoskeletal: Normal range of motion.  Neurological: She is oriented to person, place, and time.  Skin: Skin is warm and dry.  Psychiatric: She has a normal mood and affect. Her behavior is normal.  Vitals reviewed.   RECENT LABS AND TESTS: BMET    Component Value Date/Time   NA 139 05/01/2017 1009   K 4.6 05/01/2017 1009   CL 104 05/01/2017 1009   CO2 22 05/01/2017 1009   GLUCOSE 80 05/01/2017 1009   GLUCOSE 88 01/09/2012 0956   BUN 10 05/01/2017 1009   CREATININE 0.77 05/01/2017 1009   CALCIUM 9.1 05/01/2017 1009   GFRNONAA 103 05/01/2017 1009   GFRAA 119 05/01/2017 1009   Lab Results  Component Value Date   HGBA1C 5.4 10/03/2017   HGBA1C 5.3 05/01/2017   Lab Results  Component Value Date     INSULIN 21.7 10/03/2017   INSULIN 11.1 05/01/2017   CBC    Component Value Date/Time   WBC 6.8 05/01/2017 1009   WBC 7.8 01/09/2012 0956   RBC 4.67 05/01/2017 1009   RBC 5.06 01/09/2012 0956   HGB 14.0 05/01/2017 1009   HCT 43.3 05/01/2017 1009   PLT 285 01/09/2012 0956   MCV 93 05/01/2017 1009   MCH 30.0 05/01/2017 1009   MCH 29.6 01/09/2012 0956   MCHC 32.3 05/01/2017 1009   MCHC 33.3 01/09/2012 0956   RDW 13.7 05/01/2017 1009   LYMPHSABS 1.5 05/01/2017 1009   MONOABS 0.7 01/09/2012 0956  EOSABS 0.1 05/01/2017 1009   BASOSABS 0.0 05/01/2017 1009   Iron/TIBC/Ferritin/ %Sat No results found for: IRON, TIBC, FERRITIN, IRONPCTSAT Lipid Panel     Component Value Date/Time   CHOL 172 10/03/2017 0821   TRIG 73 10/03/2017 0821   HDL 53 10/03/2017 0821   CHOLHDL 3.4 01/09/2012 0956   VLDL 14 01/09/2012 0956   LDLCALC 104 (H) 10/03/2017 0821   Hepatic Function Panel     Component Value Date/Time   PROT 6.8 05/01/2017 1009   ALBUMIN 4.2 05/01/2017 1009   AST 15 05/01/2017 1009   ALT 14 05/01/2017 1009   ALKPHOS 61 05/01/2017 1009   BILITOT 0.3 05/01/2017 1009      Component Value Date/Time   TSH 1.640 05/01/2017 1009   TSH 1.850 01/09/2012 0956   Results for LUCEAL, HOLLIBAUGH (MRN [id number]) as of 01/02/2018 08:26  Ref. Range 10/03/2017 08:21  Vitamin D, 25-Hydroxy Latest Ref Range: 30.0 - 100.0 ng/mL 27.8 (L)   ASSESSMENT AND PLAN: Vitamin D deficiency - Plan: Vitamin D, Ergocalciferol, (DRISDOL) 50000 units CAPS capsule  Insulin resistance - Plan: metFORMIN (GLUCOPHAGE) 500 MG tablet  At risk for diabetes mellitus  Class 3 severe obesity with serious comorbidity and body mass index (BMI) of 50.0 to 59.9 in adult, unspecified obesity type (HCC)  PLAN:  Vitamin D Deficiency Cynthia [name] was informed that low vitamin D levels contributes to fatigue and are associated with obesity, breast, and colon cancer. She agrees to continue to take prescription Vit D @50 ,000 IU  every week #4 with no refills and will follow up for routine testing of vitamin D, at least 2-3 times per year. She was informed of the risk of over-replacement of vitamin D and agrees to not increase her dose unless she discusses this with Cynthia [name] first. Cynthia [name] agrees to follow up as directed.  Insulin Resistance Cynthia [name] will continue to work on weight loss, exercise, and decreasing simple carbohydrates in her diet to help decrease the risk of diabetes. We dicussed metformin including benefits and risks. She was informed that eating too many simple carbohydrates or too many calories at one sitting increases the likelihood of GI side effects. Cynthia [name] requested metformin for now and prescription was written today for 1 month refill. Cynthia [name] agreed to follow up with Cynthia [name] as directed to monitor her progress.  Diabetes risk counseling Cynthia [name] was given extended (15 minutes) diabetes prevention counseling today. She is 32 y.o. female and has risk factors for diabetes including obesity and insulin resistance. We discussed intensive lifestyle modifications today with an emphasis on weight loss as well as increasing exercise and decreasing simple carbohydrates in her diet.  Obesity Cynthia [name] is currently in the action stage of change. As such, her goal is to continue with weight loss efforts She has agreed to follow the Pescatarian eating plan Cynthia [name] has been instructed to work up to a goal of 150 minutes of combined cardio and strengthening exercise per week for weight loss and overall health benefits. We discussed the following Behavioral Modification Strategies today: planning for success, increasing lean protein intake, increasing vegetables and work on meal planning and easy cooking plans  We will repeat indirect calorimetry at the next visit.  Cynthia [name] has agreed to follow up with our clinic in 2 weeks. She was informed of the importance of frequent follow up visits to maximize her success with intensive lifestyle  modifications for her multiple health conditions.   OBESITY BEHAVIORAL INTERVENTION VISIT  Today's visit was # 12 out of  22.  Starting weight: 2646 lbs Starting date: 05/01/17 Today's weight : 273 lbs  Today's date: 01/02/2018 Total lbs lost to date: 0 (Patients must lose 7 lbs in the first 6 months to continue with counseling)   ASK: We discussed the diagnosis of obesity with Cynthia [name] today and Cynthia [name] agreed to give Cynthia [name] permission to discuss obesity behavioral modification therapy today.  ASSESS: Cynthia [name] has the diagnosis of obesity and her BMI today is 23.61 Cynthia [name] is in the action stage of change   ADVISE: Cynthia [name] was educated on the multiple health risks of obesity as well as the benefit of weight loss to improve her health. She was advised of the need for long term treatment and the importance of lifestyle modifications.  AGREE: Multiple dietary modification options and treatment options were discussed and  Karolee agreed to the above obesity treatment plan.  I, [place] Crane, am acting as transcriptionist for Filbert Schilder, MD  I have reviewed the above documentation for accuracy and completeness, and I agree with the above. - Debbra Riding, MD

## 2018-01-16 ENCOUNTER — Ambulatory Visit (INDEPENDENT_AMBULATORY_CARE_PROVIDER_SITE_OTHER): Payer: 59 | Admitting: Family Medicine

## 2018-01-16 VITALS — BP 113/76 | HR 102 | Temp 98.3°F | Ht 61.0 in | Wt 269.0 lb

## 2018-01-16 DIAGNOSIS — Z6841 Body Mass Index (BMI) 40.0 and over, adult: Secondary | ICD-10-CM | POA: Diagnosis not present

## 2018-01-16 DIAGNOSIS — E559 Vitamin D deficiency, unspecified: Secondary | ICD-10-CM

## 2018-01-16 DIAGNOSIS — E8881 Metabolic syndrome: Secondary | ICD-10-CM | POA: Diagnosis not present

## 2018-01-16 DIAGNOSIS — Z9189 Other specified personal risk factors, not elsewhere classified: Secondary | ICD-10-CM | POA: Diagnosis not present

## 2018-01-16 NOTE — Progress Notes (Signed)
Office: [phone]  /  Fax: [phone]   HPI:   Chief Complaint: OBESITY Cynthia [name] is here to discuss her progress with her obesity treatment plan. She is on the Pescatarian eating plan and is following her eating plan approximately 95 % of the time. She states she is doing cardio and weights for 30 minutes 4 times per week. Cynthia [name] did well with breakfast and dinner, but she struggled with lunch. She didn't like the soup option. Her weight is 269 lb (122 kg) today and has had a weight loss of 4 pounds over a period of 2 weeks since her last visit. She has gained 5 lbs since starting treatment with Cynthia [name].  Vitamin D deficiency Cynthia [name] has a diagnosis of vitamin D deficiency. She is currently taking vit D. She admits fatigue and denies nausea, vomiting or muscle weakness.  Insulin Resistance Cynthia [name] has a diagnosis of insulin resistance based on her elevated fasting insulin level >5. Although Cynthia [name]'s blood glucose readings are still under good control, insulin resistance puts her at greater risk of metabolic syndrome and diabetes. She is doing better on the Pescatarian plan and she denies carb cravings. She is taking metformin currently and continues to work on diet and exercise to decrease risk of diabetes.  At risk for diabetes Cynthia [name] is at higher than average risk for developing diabetes due to her obesity and insulin resistance. She currently denies polyuria or polydipsia.  ALLERGIES: Allergies  Allergen Reactions  . Aquacel [Carboxymethylcellulose] Itching, Rash and Other (See Comments)    Caused blisters  . Penicillins   . Oxycodone Itching and Rash    MEDICATIONS: Current Outpatient Medications on File Prior to Visit  Medication Sig Dispense Refill  . acetaminophen (TYLENOL) 500 MG tablet Take 1,000 mg by mouth every 6 (six) hours as needed.    Marland Kitchen albuterol (PROAIR HFA) 108 (90 Base) MCG/ACT inhaler Inhale 1-2 puffs into the lungs every 6 (six) hours as needed for wheezing or shortness  of breath.    . desloratadine (CLARINEX) 5 MG tablet Take 1 tablet by mouth daily.  0  . fluticasone furoate-vilanterol (BREO ELLIPTA) 200-25 MCG/INH AEPB Inhale 1 puff into the lungs daily.    Marland Kitchen HYDROcodone-acetaminophen (NORCO/VICODIN) 5-325 MG tablet Take 1 tablet by mouth every 6 (six) hours as needed for moderate pain.    . hyoscyamine (LEVSIN SL) 0.125 MG SL tablet Take 1 tablet by mouth as directed.  0  . ibuprofen (ADVIL,MOTRIN) 200 MG tablet Take 200 mg by mouth every 6 (six) hours as needed.    . metFORMIN (GLUCOPHAGE) 500 MG tablet Take 1 tablet (500 mg total) by mouth daily with breakfast. 30 tablet 0  . methocarbamol (ROBAXIN) 500 MG tablet Take 500 mg by mouth 3 (three) times daily.    . Montelukast Sodium (SINGULAIR PO) Take by mouth.      . norethindrone-ethinyl estradiol (MICROGESTIN FE 1/20) 1-20 MG-MCG tablet Take 1 tablet by mouth daily. 3 Package 4  . olopatadine (PATANOL) 0.1 % ophthalmic solution 1 drop 2 (two) times daily.    Cynthia [name] Level 80 MCG/ACT AERS as directed.  0  . traMADol (ULTRAM) 50 MG tablet as needed.  0  . traZODone (DESYREL) 100 MG tablet Take 100 mg by mouth at bedtime.     . Vitamin D, Ergocalciferol, (DRISDOL) 50000 units CAPS capsule Take 1 capsule (50,000 Units total) by mouth every 7 (seven) days. 4 capsule 0   No current facility-administered medications on file prior to visit.  PAST MEDICAL HISTORY: Past Medical History:  Diagnosis Date  . ADHD   . ADHD (attention deficit hyperactivity disorder)   . Allergic rhinitis   . Anxiety   . Asthma   . Back injury    COMPETITIVE INJURY IN HIGH SCHOOL  . Back pain   . Depression   . Fatty liver   . Fibromyalgia   . Gallbladder problem   . GERD (gastroesophageal reflux disease)   . IBS (irritable bowel syndrome)   . Joint pain   . Pneumonia     PAST SURGICAL HISTORY: Past Surgical History:  Procedure Laterality Date  . CHOLECYSTECTOMY    . CORONARY ARTERY BYPASS GRAFT    . DG DILATION  URETERS    . SHOULDER SURGERY  2002   RIGHT  . WRIST ARTHROSCOPY     Right    SOCIAL HISTORY: Social History   Tobacco Use  . Smoking status: Never Smoker  . Smokeless tobacco: Never Used  Substance Use Topics  . Alcohol use: Yes  . Drug use: No    FAMILY HISTORY: Family History  Problem Relation Age of Onset  . Multiple sclerosis Mother   . Diabetes Mother   . Hyperlipidemia Mother   . Thyroid disease Mother   . Depression Mother   . Anxiety disorder Mother   . Obesity Mother   . Hypertension Father   . Diabetes Father   . Hyperlipidemia Father   . Obesity Father   . Diabetes Maternal Grandfather   . Cancer Maternal Grandfather        prostate  . Heart disease Maternal Grandfather   . Heart disease Paternal Grandfather     ROS: Review of Systems  Constitutional: Positive for malaise/fatigue and weight loss.  Gastrointestinal: Negative for nausea and vomiting.  Genitourinary: Negative for frequency.  Musculoskeletal:       Negative for muscle weakness  Endo/Heme/Allergies: Negative for polydipsia.       Negative for carb cravings    PHYSICAL EXAM: Blood pressure 113/76, pulse (!) 102, temperature 98.3 F (36.8 C), temperature source Oral, height 5\' 1"  (1.549 m), weight 269 lb (122 kg), SpO2 98 %. Body mass index is 50.83 kg/m. Physical Exam  Constitutional: She is oriented to person, place, and time. She appears well-developed and well-nourished.  Cardiovascular: Normal rate.  Pulmonary/Chest: Effort normal.  Musculoskeletal: Normal range of motion.  Neurological: She is oriented to person, place, and time.  Skin: Skin is warm and dry.  Psychiatric: She has a normal mood and affect. Her behavior is normal.  Vitals reviewed.   RECENT LABS AND TESTS: BMET    Component Value Date/Time   NA 139 05/01/2017 1009   K 4.6 05/01/2017 1009   CL 104 05/01/2017 1009   CO2 22 05/01/2017 1009   GLUCOSE 80 05/01/2017 1009   GLUCOSE 88 01/09/2012 0956   BUN  10 05/01/2017 1009   CREATININE 0.77 05/01/2017 1009   CALCIUM 9.1 05/01/2017 1009   GFRNONAA 103 05/01/2017 1009   GFRAA 119 05/01/2017 1009   Lab Results  Component Value Date   HGBA1C 5.4 10/03/2017   HGBA1C 5.3 05/01/2017   Lab Results  Component Value Date   INSULIN 21.7 10/03/2017   INSULIN 11.1 05/01/2017   CBC    Component Value Date/Time   WBC 6.8 05/01/2017 1009   WBC 7.8 01/09/2012 0956   RBC 4.67 05/01/2017 1009   RBC 5.06 01/09/2012 0956   HGB 14.0 05/01/2017 1009   HCT 43.3  05/01/2017 1009   PLT 285 01/09/2012 0956   MCV 93 05/01/2017 1009   MCH 30.0 05/01/2017 1009   MCH 29.6 01/09/2012 0956   MCHC 32.3 05/01/2017 1009   MCHC 33.3 01/09/2012 0956   RDW 13.7 05/01/2017 1009   LYMPHSABS 1.5 05/01/2017 1009   MONOABS 0.7 01/09/2012 0956   EOSABS 0.1 05/01/2017 1009   BASOSABS 0.0 05/01/2017 1009   Iron/TIBC/Ferritin/ %Sat No results found for: IRON, TIBC, FERRITIN, IRONPCTSAT Lipid Panel     Component Value Date/Time   CHOL 172 10/03/2017 0821   TRIG 73 10/03/2017 0821   HDL 53 10/03/2017 0821   CHOLHDL 3.4 01/09/2012 0956   VLDL 14 01/09/2012 0956   LDLCALC 104 (H) 10/03/2017 0821   Hepatic Function Panel     Component Value Date/Time   PROT 6.8 05/01/2017 1009   ALBUMIN 4.2 05/01/2017 1009   AST 15 05/01/2017 1009   ALT 14 05/01/2017 1009   ALKPHOS 61 05/01/2017 1009   BILITOT 0.3 05/01/2017 1009      Component Value Date/Time   TSH 1.640 05/01/2017 1009   TSH 1.850 01/09/2012 0956   Results for EVELEN, VAZGUEZ (MRN [id number]) as of 01/16/2018 16:18  Ref. Range 10/03/2017 08:21  Vitamin D, 25-Hydroxy Latest Ref Range: 30.0 - 100.0 ng/mL 27.8 (L)   ASSESSMENT AND PLAN: Vitamin D deficiency - Plan: VITAMIN D 25 Hydroxy (Vit-D Deficiency, Fractures)  Insulin resistance - Plan: Hemoglobin A1c, Insulin, random  At risk for diabetes mellitus  Class 3 severe obesity with serious comorbidity and body mass index (BMI) of 50.0 to 59.9 in  adult, unspecified obesity type (HCC)  PLAN:  Vitamin D Deficiency Tylisha was informed that low vitamin D levels contributes to fatigue and are associated with obesity, breast, and colon cancer. She agrees to continue to take prescription Vit D @50 ,000 IU every week (no refill needed). We will check vitamin D level today and she will follow up for routine testing of vitamin D, at least 2-3 times per year. She was informed of the risk of over-replacement of vitamin D and agrees to not increase her dose unless she discusses this with Cynthia [name] first.  Insulin Resistance Aixa will continue to work on weight loss, exercise, and decreasing simple carbohydrates in her diet to help decrease the risk of diabetes. She was informed that eating too many simple carbohydrates or too many calories at one sitting increases the likelihood of GI side effects. We will check Hgb A1c and insulin today and Ashelynn agreed to follow up with Cynthia [name] as directed to monitor her progress.  Diabetes risk counseling Bertine was given extended (15 minutes) diabetes prevention counseling today. She is 32 y.o. female and has risk factors for diabetes including obesity and insulin resistance. We discussed intensive lifestyle modifications today with an emphasis on weight loss as well as increasing exercise and decreasing simple carbohydrates in her diet.  Obesity Wyatt is currently in the action stage of change. As such, her goal is to continue with weight loss efforts She has agreed to keep a food journal with 350 to 500 calories and 35+ grams of protein at lunch daily and follow the Pescatarian eating plan +200 calories. Kaydin has been instructed to work up to a goal of 150 minutes of combined cardio and strengthening exercise per week for weight loss and overall health benefits. We discussed the following Behavioral Modification Strategies today: planning for success, keep a strict food journal, increasing lean protein intake, increasing  vegetables and  work on [employer] Industriesmeal planning and easy cooking plans  [name] has agreed to follow up with our clinic in 2 weeks. She was informed of the importance of frequent follow up visits to maximize her success with intensive lifestyle modifications for her multiple health conditions.   OBESITY BEHAVIORAL INTERVENTION VISIT  Today's visit was # 13 out of 22.  Starting weight: 264 lbs Starting date: 05/01/17 Today's weight : 269 lbs  Today's date: 01/16/2018 Total lbs lost to date: 0 (Patients must lose 7 lbs in the first 6 months to continue with counseling)   ASK: We discussed the diagnosis of obesity with Simonne MaffucciSarah C Hilgeman today and Ramsie agreed to give us permission to discuss obesity behavioral modification therapy today.  ASSESS: [name] has the diagnosis of obesity and her BMI today is 7550.85 [name] is in the action stage of change   ADVISE: [name] was educated on the multiple health risks of obesity as well as the benefit of weight loss to improve her health. She was advised of the need for long term treatment and the importance of lifestyle modifications.  AGREE: Multiple dietary modification options and treatment options were discussed and  [name] agreed to the above obesity treatment plan.  I, [place] CraneJoanne Murray, am acting as transcriptionist for Filbert SchilderAlexandria U. Kadolph, MD  I have reviewed the above documentation for accuracy and completeness, and I agree with the above. - Debbra RidingAlexandria Kadolph, MD

## 2018-01-17 LAB — VITAMIN D 25 HYDROXY (VIT D DEFICIENCY, FRACTURES): Vit D, 25-Hydroxy: 33.8 ng/mL (ref 30.0–100.0)

## 2018-01-17 LAB — HEMOGLOBIN A1C
Est. average glucose Bld gHb Est-mCnc: 111 mg/dL
Hgb A1c MFr Bld: 5.5 % (ref 4.8–5.6)

## 2018-01-17 LAB — INSULIN, RANDOM: INSULIN: 12.9 u[IU]/mL (ref 2.6–24.9)

## 2018-02-06 ENCOUNTER — Ambulatory Visit (INDEPENDENT_AMBULATORY_CARE_PROVIDER_SITE_OTHER): Payer: 59 | Admitting: Family Medicine

## 2018-02-06 VITALS — BP 127/85 | HR 103 | Temp 98.3°F | Ht 61.0 in | Wt 271.0 lb

## 2018-02-06 DIAGNOSIS — Z9189 Other specified personal risk factors, not elsewhere classified: Secondary | ICD-10-CM

## 2018-02-06 DIAGNOSIS — M25561 Pain in right knee: Secondary | ICD-10-CM

## 2018-02-06 DIAGNOSIS — E559 Vitamin D deficiency, unspecified: Secondary | ICD-10-CM

## 2018-02-06 DIAGNOSIS — E8881 Metabolic syndrome: Secondary | ICD-10-CM

## 2018-02-06 DIAGNOSIS — Z6841 Body Mass Index (BMI) 40.0 and over, adult: Secondary | ICD-10-CM

## 2018-02-06 DIAGNOSIS — M25562 Pain in left knee: Secondary | ICD-10-CM

## 2018-02-06 MED ORDER — METFORMIN HCL ER 500 MG PO TB24: 500.0000 mg | ORAL_TABLET | Freq: Every day | ORAL | 0 refills | Status: DC

## 2018-02-06 MED ORDER — VITAMIN D (ERGOCALCIFEROL) 1.25 MG (50000 UNIT) PO CAPS: 50000.0000 [IU] | ORAL_CAPSULE | ORAL | 0 refills | Status: DC

## 2018-02-06 NOTE — Progress Notes (Signed)
Office: [phone]  /  Fax: [phone]   HPI:   Chief Complaint: OBESITY Cynthia [name] is here to discuss her progress with her obesity treatment plan. She is keeping a food journal with 350 to 500 calories and 35 plus grams of protein at lunch and following the pescatarian and Category 2 plan. She is following her eating plan approximately 70 % of the time. She states she is exercising 0 minutes 0 times per week. Cynthia [name] is frustrated with hunger at night. She is craving sugar and sweets. Her weight is 271 lb (122.9 kg) today and has not lost weight since her last visit. She has lost 0 lbs since starting treatment with [place].  Vitamin D deficiency Cynthia [name] has a diagnosis of vitamin D deficiency. She is currently taking vit D and admits fatigue. She denies nausea, vomiting or muscle weakness.  Insulin Resistance Cynthia [name] has a diagnosis of insulin resistance based on her elevated fasting insulin [name] >5. Although Cynthia [name]'s blood glucose readings are still under good control, insulin resistance puts her at greater risk of metabolic syndrome and diabetes. She is taking metformin currently and continues to work on diet and exercise to decrease risk of diabetes. Cynthia [name] admits to craving carbs and denies GI side effects.  At risk for diabetes Cynthia [name] is at higher than average risk for developing diabetes due to her obesity and insulin resistance. She currently denies polyuria or polydipsia.  Bilateral Knee Pain Cynthia [name] is getting an MRI of her Right knee and getting an injection of her Left knee. She requested an increased dose of her pain medicine.  ALLERGIES: Allergies  Allergen Reactions  . Aquacel [Carboxymethylcellulose] Itching, Rash and Other (See Comments)    Caused blisters  . Penicillins   . Oxycodone Itching and Rash    MEDICATIONS: Current Outpatient Medications on File Prior to Visit  Medication Sig Dispense Refill  . acetaminophen (TYLENOL) 500 MG tablet Take 1,000 mg by mouth every 6  (six) hours as needed.    Marland Kitchen albuterol (PROAIR HFA) 108 (90 Base) MCG/ACT inhaler Inhale 1-2 puffs into the lungs every 6 (six) hours as needed for wheezing or shortness of breath.    . desloratadine (CLARINEX) 5 MG tablet Take 1 tablet by mouth daily.  0  . fluticasone furoate-vilanterol (BREO ELLIPTA) 200-25 MCG/INH AEPB Inhale 1 puff into the lungs daily.    Marland Kitchen HYDROcodone-acetaminophen (NORCO/VICODIN) 5-325 MG tablet Take 1 tablet by mouth every 6 (six) hours as needed for moderate pain.    . hyoscyamine (LEVSIN SL) 0.125 MG SL tablet Take 1 tablet by mouth as directed.  0  . ibuprofen (ADVIL,MOTRIN) 200 MG tablet Take 200 mg by mouth every 6 (six) hours as needed.    . methocarbamol (ROBAXIN) 500 MG tablet Take 500 mg by mouth 3 (three) times daily.    . Montelukast Sodium (SINGULAIR PO) Take by mouth.      . norethindrone-ethinyl estradiol (MICROGESTIN FE 1/20) 1-20 MG-MCG tablet Take 1 tablet by mouth daily. 3 Package 4  . olopatadine (PATANOL) 0.1 % ophthalmic solution 1 drop 2 (two) times daily.    Cynthia [name] 80 MCG/ACT AERS as directed.  0  . traMADol (ULTRAM) 50 MG tablet as needed.  0  . traZODone (DESYREL) 100 MG tablet Take 100 mg by mouth at bedtime.     . Vitamin D, Ergocalciferol, (DRISDOL) 50000 units CAPS capsule Take 1 capsule (50,000 Units total) by mouth every 7 (seven) days. 4 capsule 0   No current facility-administered medications  on file prior to visit.     PAST MEDICAL HISTORY: Past Medical History:  Diagnosis Date  . ADHD   . ADHD (attention deficit hyperactivity disorder)   . Allergic rhinitis   . Anxiety   . Asthma   . Back injury    COMPETITIVE INJURY IN HIGH SCHOOL  . Back pain   . Depression   . Fatty liver   . Fibromyalgia   . Gallbladder problem   . GERD (gastroesophageal reflux disease)   . IBS (irritable bowel syndrome)   . Joint pain   . Pneumonia     PAST SURGICAL HISTORY: Past Surgical History:  Procedure Laterality Date  . CHOLECYSTECTOMY     . CORONARY ARTERY BYPASS GRAFT    . DG DILATION URETERS    . SHOULDER SURGERY  2002   RIGHT  . WRIST ARTHROSCOPY     Right    SOCIAL HISTORY: Social History   Tobacco Use  . Smoking status: Never Smoker  . Smokeless tobacco: Never Used  Substance Use Topics  . Alcohol use: Yes  . Drug use: No    FAMILY HISTORY: Family History  Problem Relation Age of Onset  . Multiple sclerosis Mother   . Diabetes Mother   . Hyperlipidemia Mother   . Thyroid disease Mother   . Depression Mother   . Anxiety disorder Mother   . Obesity Mother   . Hypertension Father   . Diabetes Father   . Hyperlipidemia Father   . Obesity Father   . Diabetes Maternal Grandfather   . Cancer Maternal Grandfather        prostate  . Heart disease Maternal Grandfather   . Heart disease Paternal Grandfather     ROS: Review of Systems  Constitutional: Positive for malaise/fatigue. Negative for weight loss.  Gastrointestinal: Negative for diarrhea, nausea and vomiting.       Positive for craving carbs Sugar and sweet cravings  Musculoskeletal:       Negative for muscle weakness Positive for bilateral knee pain  Endo/Heme/Allergies: Negative for polydipsia.       Negative for polyuria    PHYSICAL EXAM: Blood pressure 127/85, pulse (!) 103, temperature 98.3 F (36.8 C), temperature source Oral, height 5\' 1"  (1.549 m), weight 271 lb (122.9 kg), SpO2 97 %. Body mass index is 51.21 kg/m. Physical Exam  Constitutional: She is oriented to person, place, and time. She appears well-developed and well-nourished.  Cardiovascular: Normal rate.  Pulmonary/Chest: Effort normal.  Musculoskeletal: Normal range of motion.  Neurological: She is oriented to person, place, and time.  Skin: Skin is warm and dry.  Psychiatric: She has a normal mood and affect. Her behavior is normal.  Vitals reviewed.   RECENT LABS AND TESTS: BMET    Component Value Date/Time   NA 139 05/01/2017 1009   K 4.6 05/01/2017  1009   CL 104 05/01/2017 1009   CO2 22 05/01/2017 1009   GLUCOSE 80 05/01/2017 1009   GLUCOSE 88 01/09/2012 0956   BUN 10 05/01/2017 1009   CREATININE 0.77 05/01/2017 1009   CALCIUM 9.1 05/01/2017 1009   GFRNONAA 103 05/01/2017 1009   GFRAA 119 05/01/2017 1009   Lab Results  Component Value Date   HGBA1C 5.5 01/16/2018   HGBA1C 5.4 10/03/2017   HGBA1C 5.3 05/01/2017   Lab Results  Component Value Date   INSULIN 12.9 01/16/2018   INSULIN 21.7 10/03/2017   INSULIN 11.1 05/01/2017   CBC    Component Value Date/Time  WBC 6.8 05/01/2017 1009   WBC 7.8 01/09/2012 0956   RBC 4.67 05/01/2017 1009   RBC 5.06 01/09/2012 0956   HGB 14.0 05/01/2017 1009   HCT 43.3 05/01/2017 1009   PLT 285 01/09/2012 0956   MCV 93 05/01/2017 1009   MCH 30.0 05/01/2017 1009   MCH 29.6 01/09/2012 0956   MCHC 32.3 05/01/2017 1009   MCHC 33.3 01/09/2012 0956   RDW 13.7 05/01/2017 1009   LYMPHSABS 1.5 05/01/2017 1009   MONOABS 0.7 01/09/2012 0956   EOSABS 0.1 05/01/2017 1009   BASOSABS 0.0 05/01/2017 1009   Iron/TIBC/Ferritin/ %Sat No results found for: IRON, TIBC, FERRITIN, IRONPCTSAT Lipid Panel     Component Value Date/Time   CHOL 172 10/03/2017 0821   TRIG 73 10/03/2017 0821   HDL 53 10/03/2017 0821   CHOLHDL 3.4 01/09/2012 0956   VLDL 14 01/09/2012 0956   LDLCALC 104 (H) 10/03/2017 0821   Hepatic Function Panel     Component Value Date/Time   PROT 6.8 05/01/2017 1009   ALBUMIN 4.2 05/01/2017 1009   AST 15 05/01/2017 1009   ALT 14 05/01/2017 1009   ALKPHOS 61 05/01/2017 1009   BILITOT 0.3 05/01/2017 1009      Component Value Date/Time   TSH 1.640 05/01/2017 1009   TSH 1.850 01/09/2012 0956   Results for DONNICA, JARNAGIN (MRN [id number]) as of 02/06/2018 15:25  Ref. Range 01/16/2018 13:47  Vitamin D, 25-Hydroxy Latest Ref Range: 30.0 - 100.0 ng/mL 33.8    ASSESSMENT AND PLAN: Pain in both knees, unspecified chronicity  Insulin resistance - Plan: metFORMIN (GLUCOPHAGE-XR)  500 MG 24 hr tablet  Vitamin D deficiency - Plan: Vitamin D, Ergocalciferol, (DRISDOL) 50000 units CAPS capsule  At risk for diabetes mellitus  Class 3 severe obesity with serious comorbidity and body mass index (BMI) of 50.0 to 59.9 in adult, unspecified obesity type (HCC)  PLAN:  Vitamin D Deficiency Cynthia [name] was informed that low vitamin D levels contributes to fatigue and are associated with obesity, breast, and colon cancer. She agrees to continue to take prescription Vit D @50 ,000 IU every week #4 with no refills and will follow up for routine testing of vitamin D, at least 2-3 times per year. She was informed of the risk of over-replacement of vitamin D and agrees to not increase her dose unless she discusses this with [place] first. Gresia agrees to follow up in 2 weeks.  Insulin Resistance Cynthia [name] will continue to work on weight loss, exercise, and decreasing simple carbohydrates in her diet to help decrease the risk of diabetes. We dicussed metformin including benefits and risks. She was informed that eating too many simple carbohydrates or too many calories at one sitting increases the likelihood of GI side effects. Britnay agreed to continue taking metformin. She agrees to change to Metformin ER 500mg  po daily #30 with no refills. Cynthia [name] agreed to follow up with [place] as directed to monitor her progress in 2 weeks.   Diabetes risk counselling Cynthia [name] was given extended (15 minutes) diabetes prevention counseling today. She is 32 y.o. female and has risk factors for diabetes including obesity and insulin resistance. We discussed intensive lifestyle modifications today with an emphasis on weight loss as well as increasing exercise and decreasing simple carbohydrates in her diet.  Bilateral Knee Pain Cynthia [name] will follow up with Dr Thomasena Edis at [facility].  Obesity Cynthia [name] is currently in the action stage of change. As such, her goal is to continue with weight loss efforts. She  has agreed to the  [employer]catarian diet plan plus 200 calories. Cynthia [name] has been instructed to work up to a goal of 150 minutes of combined cardio and strengthening exercise per week for weight loss and overall health benefits. We discussed the following Behavioral Modification Strategies today: increasing lean protein intake, increasing vegetables, work on meal planning and easy cooking plans, and planning for success.   Cynthia [name] has agreed to follow up with our clinic in 2 weeks. She was informed of the importance of frequent follow up visits to maximize her success with intensive lifestyle modifications for her multiple health conditions.   OBESITY BEHAVIORAL INTERVENTION VISIT  Today's visit was #14 out of 22.  Starting weight: 264 lbs Starting date: 05/01/17 Today's weight : Weight: 271 lb (122.9 kg)  Today's date: 02/06/2018 Total lbs lost to date: 0    ASK: We discussed the diagnosis of obesity with Cynthia [name] today and Cynthia [name] agreed to give us permission to discuss obesity behavioral modification therapy today.  ASSESS: Cynthia [name] has the diagnosis of obesity and her BMI today is 51.23 Cynthia [name] is in the action stage of change.   ADVISE: Cynthia [name] was educated on the multiple health risks of obesity as well as the benefit of weight loss to improve her health. She was advised of the need for long term treatment and the importance of lifestyle modifications.  AGREE: Multiple dietary modification options and treatment options were discussed and  Cynthia [name] agreed to the above obesity treatment plan.  I, Kirke Corinara Soares, am acting as Energy managertranscriptionist for Filbert SchilderAlexandria U. Kadolph, MD  I have reviewed the above documentation for accuracy and completeness, and I agree with the above. - Debbra RidingAlexandria Kadolph, MD

## 2018-02-19 ENCOUNTER — Ambulatory Visit (INDEPENDENT_AMBULATORY_CARE_PROVIDER_SITE_OTHER): Payer: 59 | Admitting: Family Medicine

## 2018-02-19 VITALS — BP 130/64 | HR 109 | Temp 97.9°F | Ht 61.0 in | Wt 275.0 lb

## 2018-02-19 DIAGNOSIS — Z6841 Body Mass Index (BMI) 40.0 and over, adult: Secondary | ICD-10-CM | POA: Diagnosis not present

## 2018-02-19 DIAGNOSIS — E559 Vitamin D deficiency, unspecified: Secondary | ICD-10-CM

## 2018-02-19 DIAGNOSIS — E8881 Metabolic syndrome: Secondary | ICD-10-CM

## 2018-02-20 NOTE — Progress Notes (Signed)
Office: [phone]  /  Fax: [phone]   HPI:   Chief Complaint: OBESITY Cynthia [name] is here to discuss her progress with her obesity treatment plan. She is on the Pescatarian eating plan and is following her eating plan approximately 50 % of the time. She states she is exercising 0 minutes 0 times per week. Cynthia [name] needs surgical meniscus tear repair. She has been drinking more wine recently. Cynthia [name] has been eating out and she got a few slices of chocolate cake. She hasn't meal planned or grocery shopped.  Her weight is 275 lb (124.7 kg) today and has had a weight loss of 4 pounds over a period of 2 weeks since her last visit. She has gained 11 lbs since starting treatment with us.  Insulin Resistance Cynthia [name] has a diagnosis of insulin resistance based on her elevated fasting insulin level >5. Although Misty's blood glucose readings are still under good control, insulin resistance puts her at greater risk of metabolic syndrome and diabetes. She is taking metformin currently and continues to work on diet and exercise to decrease risk of diabetes.  Cynthia [name] denies nausea or vomiting on metformin.  Vitamin D deficiency Cynthia [name] has a diagnosis of vitamin D deficiency. She is currently taking vit D and admits fatigue, but denies nausea, vomiting or muscle weakness.   ALLERGIES: Allergies  Allergen Reactions  . Aquacel [Carboxymethylcellulose] Itching, Rash and Other (See Comments)    Caused blisters  . Penicillins   . Oxycodone Itching and Rash    MEDICATIONS: Current Outpatient Medications on File Prior to Visit  Medication Sig Dispense Refill  . acetaminophen (TYLENOL) 500 MG tablet Take 1,000 mg by mouth every 6 (six) hours as needed.    Marland Kitchen. albuterol (PROAIR HFA) 108 (90 Base) MCG/ACT inhaler Inhale 1-2 puffs into the lungs every 6 (six) hours as needed for wheezing or shortness of breath.    . desloratadine (CLARINEX) 5 MG tablet Take 1 tablet by mouth daily.  0  . fluticasone  furoate-vilanterol (BREO ELLIPTA) 200-25 MCG/INH AEPB Inhale 1 puff into the lungs daily.    Marland Kitchen. HYDROcodone-acetaminophen (NORCO/VICODIN) 5-325 MG tablet Take 1 tablet by mouth every 6 (six) hours as needed for moderate pain.    . hyoscyamine (LEVSIN SL) 0.125 MG SL tablet Take 1 tablet by mouth as directed.  0  . ibuprofen (ADVIL,MOTRIN) 200 MG tablet Take 200 mg by mouth every 6 (six) hours as needed.    . metFORMIN (GLUCOPHAGE-XR) 500 MG 24 hr tablet Take 1 tablet (500 mg total) by mouth daily with breakfast. 30 tablet 0  . methocarbamol (ROBAXIN) 500 MG tablet Take 500 mg by mouth 3 (three) times daily.    . Montelukast Sodium (SINGULAIR PO) Take by mouth.      . norethindrone-ethinyl estradiol (MICROGESTIN FE 1/20) 1-20 MG-MCG tablet Take 1 tablet by mouth daily. 3 Package 4  . olopatadine (PATANOL) 0.1 % ophthalmic solution 1 drop 2 (two) times daily.    Illa Level. QNASL 80 MCG/ACT AERS as directed.  0  . traMADol (ULTRAM) 50 MG tablet as needed.  0  . traZODone (DESYREL) 100 MG tablet Take 100 mg by mouth at bedtime.     . Vitamin D, Ergocalciferol, (DRISDOL) 50000 units CAPS capsule Take 1 capsule (50,000 Units total) by mouth every 7 (seven) days. 4 capsule 0   No current facility-administered medications on file prior to visit.     PAST MEDICAL HISTORY: Past Medical History:  Diagnosis Date  . ADHD   .  ADHD (attention deficit hyperactivity disorder)   . Allergic rhinitis   . Anxiety   . Asthma   . Back injury    COMPETITIVE INJURY IN HIGH SCHOOL  . Back pain   . Depression   . Fatty liver   . Fibromyalgia   . Gallbladder problem   . GERD (gastroesophageal reflux disease)   . IBS (irritable bowel syndrome)   . Joint pain   . Pneumonia     PAST SURGICAL HISTORY: Past Surgical History:  Procedure Laterality Date  . CHOLECYSTECTOMY    . CORONARY ARTERY BYPASS GRAFT    . DG DILATION URETERS    . SHOULDER SURGERY  2002   RIGHT  . WRIST ARTHROSCOPY     Right    SOCIAL  HISTORY: Social History   Tobacco Use  . Smoking status: Never Smoker  . Smokeless tobacco: Never Used  Substance Use Topics  . Alcohol use: Yes  . Drug use: No    FAMILY HISTORY: Family History  Problem Relation Age of Onset  . Multiple sclerosis Mother   . Diabetes Mother   . Hyperlipidemia Mother   . Thyroid disease Mother   . Depression Mother   . Anxiety disorder Mother   . Obesity Mother   . Hypertension Father   . Diabetes Father   . Hyperlipidemia Father   . Obesity Father   . Diabetes Maternal Grandfather   . Cancer Maternal Grandfather        prostate  . Heart disease Maternal Grandfather   . Heart disease Paternal Grandfather     ROS: Review of Systems  Constitutional: Positive for malaise/fatigue. Negative for weight loss.  Gastrointestinal: Negative for nausea and vomiting.  Musculoskeletal:       Negative for muscle weakness    PHYSICAL EXAM: Blood pressure 130/64, pulse (!) 109, temperature 97.9 F (36.6 C), temperature source Oral, height 5\' 1"  (1.549 m), weight 275 lb (124.7 kg), SpO2 97 %. Body mass index is 51.96 kg/m. Physical Exam  Constitutional: She is oriented to person, place, and time. She appears well-developed and well-nourished.  Cardiovascular: Normal rate.  Pulmonary/Chest: Effort normal.  Musculoskeletal: Normal range of motion.  Neurological: She is oriented to person, place, and time.  Skin: Skin is warm and dry.  Psychiatric: She has a normal mood and affect. Her behavior is normal.  Vitals reviewed.   RECENT LABS AND TESTS: BMET    Component Value Date/Time   NA 139 05/01/2017 1009   K 4.6 05/01/2017 1009   CL 104 05/01/2017 1009   CO2 22 05/01/2017 1009   GLUCOSE 80 05/01/2017 1009   GLUCOSE 88 01/09/2012 0956   BUN 10 05/01/2017 1009   CREATININE 0.77 05/01/2017 1009   CALCIUM 9.1 05/01/2017 1009   GFRNONAA 103 05/01/2017 1009   GFRAA 119 05/01/2017 1009   Lab Results  Component Value Date   HGBA1C 5.5  01/16/2018   HGBA1C 5.4 10/03/2017   HGBA1C 5.3 05/01/2017   Lab Results  Component Value Date   INSULIN 12.9 01/16/2018   INSULIN 21.7 10/03/2017   INSULIN 11.1 05/01/2017   CBC    Component Value Date/Time   WBC 6.8 05/01/2017 1009   WBC 7.8 01/09/2012 0956   RBC 4.67 05/01/2017 1009   RBC 5.06 01/09/2012 0956   HGB 14.0 05/01/2017 1009   HCT 43.3 05/01/2017 1009   PLT 285 01/09/2012 0956   MCV 93 05/01/2017 1009   MCH 30.0 05/01/2017 1009   MCH 29.6  01/09/2012 0956   MCHC 32.3 05/01/2017 1009   MCHC 33.3 01/09/2012 0956   RDW 13.7 05/01/2017 1009   LYMPHSABS 1.5 05/01/2017 1009   MONOABS 0.7 01/09/2012 0956   EOSABS 0.1 05/01/2017 1009   BASOSABS 0.0 05/01/2017 1009   Iron/TIBC/Ferritin/ %Sat No results found for: IRON, TIBC, FERRITIN, IRONPCTSAT Lipid Panel     Component Value Date/Time   CHOL 172 10/03/2017 0821   TRIG 73 10/03/2017 0821   HDL 53 10/03/2017 0821   CHOLHDL 3.4 01/09/2012 0956   VLDL 14 01/09/2012 0956   LDLCALC 104 (H) 10/03/2017 0821   Hepatic Function Panel     Component Value Date/Time   PROT 6.8 05/01/2017 1009   ALBUMIN 4.2 05/01/2017 1009   AST 15 05/01/2017 1009   ALT 14 05/01/2017 1009   ALKPHOS 61 05/01/2017 1009   BILITOT 0.3 05/01/2017 1009      Component Value Date/Time   TSH 1.640 05/01/2017 1009   TSH 1.850 01/09/2012 0956   Results for EMANUELLA, NICKLE (MRN [id number]) as of 02/20/2018 16:11  Ref. Range 01/16/2018 13:47  Vitamin D, 25-Hydroxy Latest Ref Range: 30.0 - 100.0 ng/mL 33.8   ASSESSMENT AND PLAN: Insulin resistance  Vitamin D deficiency  Class 3 severe obesity with serious comorbidity and body mass index (BMI) of 50.0 to 59.9 in adult, unspecified obesity type (HCC)  PLAN:  Insulin Resistance Ruthia will continue to work on weight loss, exercise, and decreasing simple carbohydrates in her diet to help decrease the risk of diabetes. We dicussed metformin including benefits and risks. She was informed that  eating too many simple carbohydrates or too many calories at one sitting increases the likelihood of GI side effects. Britney will continue metformin and no refill is needed at this time. Belva agreed to follow up with [place] as directed to monitor her progress.  Vitamin D Deficiency Kylia was informed that low vitamin D levels contributes to fatigue and are associated with obesity, breast, and colon cancer. She agrees to continue to take prescription Vit D @50 ,000 IU every week (no refill needed) and will follow up for routine testing of vitamin D, at least 2-3 times per year. She was informed of the risk of over-replacement of vitamin D and agrees to not increase her dose unless she discusses this with [place] first.  We spent > than 50% of the 15 minute visit on the counseling as documented in the note.  Obesity Carleena is currently in the action stage of change. As such, her goal is to continue with weight loss efforts She has agreed to follow the Pescatarian eating plan Tyechia has been instructed to work up to a goal of 150 minutes of combined cardio and strengthening exercise per week for weight loss and overall health benefits. We discussed the following Behavioral Modification Strategies today: better snacking choices, increase H2O intake, decreasing simple carbohydrates , decrease eating out and emotional eating strategies  Antonisha has agreed to follow up with our clinic in 2 weeks. She was informed of the importance of frequent follow up visits to maximize her success with intensive lifestyle modifications for her multiple health conditions.   OBESITY BEHAVIORAL INTERVENTION VISIT  Today's visit was # 15 out of 22.  Starting weight: 264 lbs Starting date: 05/01/17 Today's weight : 275 lbs  Today's date: 02/19/18 Total lbs lost to date: 0    ASK: We discussed the diagnosis of obesity with [name] today and Brycelynn agreed to give [place] permission to  discuss obesity behavioral modification  therapy today.  ASSESS: Evony has the diagnosis of obesity and her BMI today is 51.99 Bridgitte is in the action stage of change   ADVISE: Despina was educated on the multiple health risks of obesity as well as the benefit of weight loss to improve her health. She was advised of the need for long term treatment and the importance of lifestyle modifications.  AGREE: Multiple dietary modification options and treatment options were discussed and  Jakai agreed to the above obesity treatment plan.  I, [place] Crane, am acting as transcriptionist for Filbert Schilder, MD  I have reviewed the above documentation for accuracy and completeness, and I agree with the above. - Debbra Riding, MD

## 2018-03-12 ENCOUNTER — Ambulatory Visit (INDEPENDENT_AMBULATORY_CARE_PROVIDER_SITE_OTHER): Payer: 59 | Admitting: Family Medicine

## 2018-03-12 DIAGNOSIS — J4530 Mild persistent asthma, uncomplicated: Secondary | ICD-10-CM | POA: Diagnosis not present

## 2018-03-12 DIAGNOSIS — M62838 Other muscle spasm: Secondary | ICD-10-CM | POA: Diagnosis not present

## 2018-03-25 ENCOUNTER — Ambulatory Visit (INDEPENDENT_AMBULATORY_CARE_PROVIDER_SITE_OTHER): Payer: BLUE CROSS/BLUE SHIELD | Admitting: Family Medicine

## 2018-03-25 VITALS — BP 133/83 | HR 113 | Temp 97.9°F | Ht 61.0 in | Wt 277.0 lb

## 2018-03-25 DIAGNOSIS — E8881 Metabolic syndrome: Secondary | ICD-10-CM | POA: Diagnosis not present

## 2018-03-25 DIAGNOSIS — Z9189 Other specified personal risk factors, not elsewhere classified: Secondary | ICD-10-CM | POA: Diagnosis not present

## 2018-03-25 DIAGNOSIS — Z6841 Body Mass Index (BMI) 40.0 and over, adult: Secondary | ICD-10-CM

## 2018-03-25 DIAGNOSIS — E559 Vitamin D deficiency, unspecified: Secondary | ICD-10-CM | POA: Diagnosis not present

## 2018-03-25 MED ORDER — VITAMIN D (ERGOCALCIFEROL) 1.25 MG (50000 UNIT) PO CAPS: 50000.0000 [IU] | ORAL_CAPSULE | ORAL | 0 refills | Status: DC

## 2018-03-25 MED ORDER — METFORMIN HCL ER 500 MG PO TB24: 500.0000 mg | ORAL_TABLET | Freq: Every day | ORAL | 0 refills | Status: DC

## 2018-03-25 NOTE — Progress Notes (Signed)
Office: [phone]  /  Fax: [phone]   HPI:   Chief Complaint: OBESITY Cynthia [name] is here to discuss her progress with her obesity treatment plan. She is on the Pescatarian eating plan and is following her eating plan approximately 65 % of the time. She states she is exercising 0 minutes 0 times per week. Cynthia [name] has had a good amount of celebrations, out of town guests, breakfast and lunch, and on plan. She is trying to make good choices at dinner and getting protein in . She is leaving for the beach in 4 days.  Her weight is 277 lb (125.6 kg) today and has gained 2 pounds since her last visit. She has lost 0 lbs since starting treatment with [place].  Vitamin D Deficiency Cynthia [name] has a diagnosis of vitamin D deficiency. She is currently taking prescription Vit D. She notes fatigue and denies nausea, vomiting or muscle weakness.  Insulin Resistance Cynthia [name] has a diagnosis of insulin resistance based on her elevated fasting insulin [name] >5. Although Cynthia [name]'s blood glucose readings are still under good control, insulin resistance puts her at greater risk of metabolic syndrome and diabetes. She is taking metformin currently and she notes carbohydrate cravings intermittently. She continues to work on diet and exercise to decrease risk of diabetes.  At risk for diabetes Cynthia [name] is at higher than average risk for developing diabetes due to her obesity and insulin resistance. She currently denies polyuria or polydipsia.  ALLERGIES: Allergies  Allergen Reactions  . Aquacel [Carboxymethylcellulose] Itching, Rash and Other (See Comments)    Caused blisters  . Penicillins   . Oxycodone Itching and Rash    MEDICATIONS: Current Outpatient Medications on File Prior to Visit  Medication Sig Dispense Refill  . acetaminophen (TYLENOL) 500 MG tablet Take 1,000 mg by mouth every 6 (six) hours as needed.    Cynthia [name] Kitchen albuterol (PROAIR HFA) 108 (90 Base) MCG/ACT inhaler Inhale 1-2 puffs into the lungs every 6 (six)  hours as needed for wheezing or shortness of breath.    . desloratadine (CLARINEX) 5 MG tablet Take 1 tablet by mouth daily.  0  . fluticasone furoate-vilanterol (BREO ELLIPTA) 200-25 MCG/INH AEPB Inhale 1 puff into the lungs daily.    Cynthia [name] Kitchen HYDROcodone-acetaminophen (NORCO/VICODIN) 5-325 MG tablet Take 1 tablet by mouth every 6 (six) hours as needed for moderate pain.    . hyoscyamine (LEVSIN SL) 0.125 MG SL tablet Take 1 tablet by mouth as directed.  0  . ibuprofen (ADVIL,MOTRIN) 200 MG tablet Take 200 mg by mouth every 6 (six) hours as needed.    . methocarbamol (ROBAXIN) 500 MG tablet Take 500 mg by mouth 3 (three) times daily.    . Montelukast Sodium (SINGULAIR PO) Take by mouth.      . norethindrone-ethinyl estradiol (MICROGESTIN FE 1/20) 1-20 MG-MCG tablet Take 1 tablet by mouth daily. 3 Package 4  . olopatadine (PATANOL) 0.1 % ophthalmic solution 1 drop 2 (two) times daily.    Cynthia [name] 80 MCG/ACT AERS as directed.  0  . traMADol (ULTRAM) 50 MG tablet as needed.  0  . traZODone (DESYREL) 100 MG tablet Take 100 mg by mouth at bedtime.      No current facility-administered medications on file prior to visit.     PAST MEDICAL HISTORY: Past Medical History:  Diagnosis Date  . ADHD   . ADHD (attention deficit hyperactivity disorder)   . Allergic rhinitis   . Anxiety   . Asthma   . Back injury  COMPETITIVE INJURY IN HIGH SCHOOL  . Back pain   . Depression   . Fatty liver   . Fibromyalgia   . Gallbladder problem   . GERD (gastroesophageal reflux disease)   . IBS (irritable bowel syndrome)   . Joint pain   . Pneumonia     PAST SURGICAL HISTORY: Past Surgical History:  Procedure Laterality Date  . CHOLECYSTECTOMY    . CORONARY ARTERY BYPASS GRAFT    . DG DILATION URETERS    . SHOULDER SURGERY  2002   RIGHT  . WRIST ARTHROSCOPY     Right    SOCIAL HISTORY: Social History   Tobacco Use  . Smoking status: Never Smoker  . Smokeless tobacco: Never Used  Substance Use  Topics  . Alcohol use: Yes  . Drug use: No    FAMILY HISTORY: Family History  Problem Relation Age of Onset  . Multiple sclerosis Mother   . Diabetes Mother   . Hyperlipidemia Mother   . Thyroid disease Mother   . Depression Mother   . Anxiety disorder Mother   . Obesity Mother   . Hypertension Father   . Diabetes Father   . Hyperlipidemia Father   . Obesity Father   . Diabetes Maternal Grandfather   . Cancer Maternal Grandfather        prostate  . Heart disease Maternal Grandfather   . Heart disease Paternal Grandfather     ROS: Review of Systems  Constitutional: Positive for malaise/fatigue. Negative for weight loss.  Gastrointestinal: Negative for nausea and vomiting.  Genitourinary: Negative for frequency.  Musculoskeletal:       Negative muscle weakness  Endo/Heme/Allergies: Negative for polydipsia.    PHYSICAL EXAM: Blood pressure 133/83, pulse (!) 113, temperature 97.9 F (36.6 C), temperature source Oral, height 5\' 1"  (1.549 m), weight 277 lb (125.6 kg), SpO2 98 %. Body mass index is 52.34 kg/m. Physical Exam  Constitutional: She is oriented to person, place, and time. She appears well-developed and well-nourished.  Cardiovascular: Normal rate.  Pulmonary/Chest: Effort normal.  Musculoskeletal: Normal range of motion.  Neurological: She is oriented to person, place, and time.  Skin: Skin is warm and dry.  Psychiatric: She has a normal mood and affect. Her behavior is normal.  Vitals reviewed.   RECENT LABS AND TESTS: BMET    Component Value Date/Time   NA 139 05/01/2017 1009   K 4.6 05/01/2017 1009   CL 104 05/01/2017 1009   CO2 22 05/01/2017 1009   GLUCOSE 80 05/01/2017 1009   GLUCOSE 88 01/09/2012 0956   BUN 10 05/01/2017 1009   CREATININE 0.77 05/01/2017 1009   CALCIUM 9.1 05/01/2017 1009   GFRNONAA 103 05/01/2017 1009   GFRAA 119 05/01/2017 1009   Lab Results  Component Value Date   HGBA1C 5.5 01/16/2018   HGBA1C 5.4 10/03/2017    HGBA1C 5.3 05/01/2017   Lab Results  Component Value Date   INSULIN 12.9 01/16/2018   INSULIN 21.7 10/03/2017   INSULIN 11.1 05/01/2017   CBC    Component Value Date/Time   WBC 6.8 05/01/2017 1009   WBC 7.8 01/09/2012 0956   RBC 4.67 05/01/2017 1009   RBC 5.06 01/09/2012 0956   HGB 14.0 05/01/2017 1009   HCT 43.3 05/01/2017 1009   PLT 285 01/09/2012 0956   MCV 93 05/01/2017 1009   MCH 30.0 05/01/2017 1009   MCH 29.6 01/09/2012 0956   MCHC 32.3 05/01/2017 1009   MCHC 33.3 01/09/2012 0956   RDW  13.7 05/01/2017 1009   LYMPHSABS 1.5 05/01/2017 1009   MONOABS 0.7 01/09/2012 0956   EOSABS 0.1 05/01/2017 1009   BASOSABS 0.0 05/01/2017 1009   Iron/TIBC/Ferritin/ %Sat No results found for: IRON, TIBC, FERRITIN, IRONPCTSAT Lipid Panel     Component Value Date/Time   CHOL 172 10/03/2017 0821   TRIG 73 10/03/2017 0821   HDL 53 10/03/2017 0821   CHOLHDL 3.4 01/09/2012 0956   VLDL 14 01/09/2012 0956   LDLCALC 104 (H) 10/03/2017 0821   Hepatic Function Panel     Component Value Date/Time   PROT 6.8 05/01/2017 1009   ALBUMIN 4.2 05/01/2017 1009   AST 15 05/01/2017 1009   ALT 14 05/01/2017 1009   ALKPHOS 61 05/01/2017 1009   BILITOT 0.3 05/01/2017 1009      Component Value Date/Time   TSH 1.640 05/01/2017 1009   TSH 1.850 01/09/2012 0956  Results for Cynthia [name], Cynthia [name] (MRN [id number]) as of 03/25/2018 15:18  Ref. Range 01/16/2018 13:47  Vitamin D, 25-Hydroxy Latest Ref Range: 30.0 - 100.0 ng/mL 33.8    ASSESSMENT AND PLAN: Vitamin D deficiency - Plan: Vitamin D, Ergocalciferol, (DRISDOL) 50000 units CAPS capsule  Insulin resistance - Plan: metFORMIN (GLUCOPHAGE-XR) 500 MG 24 hr tablet  At risk for diabetes mellitus  Class 3 severe obesity with serious comorbidity and body mass index (BMI) of 50.0 to 59.9 in adult, unspecified obesity type (HCC)  PLAN:  Vitamin D Deficiency Cynthia [name] was informed that low vitamin D levels contributes to fatigue and are associated with  obesity, breast, and colon cancer. Cynthia [name] agrees to continue taking prescription Vit D @50 ,000 IU every week #4 and we will refill for 1 month. She will follow up for routine testing of vitamin D, at least 2-3 times per year. She was informed of the risk of over-replacement of vitamin D and agrees to not increase her dose unless she discusses this with [place] first. Cynthia [name] agrees to follow up with our clinic in 3 to 4 weeks.  Insulin Resistance Cynthia [name] will continue to work on weight loss, exercise, and decreasing simple carbohydrates in her diet to help decrease the risk of diabetes. We dicussed metformin including benefits and risks. She was informed that eating too many simple carbohydrates or too many calories at one sitting increases the likelihood of GI side effects. Cynthia [name] agrees to continue taking metformin 500 mg PO q AM #30 and we will refill for 1 month. Cynthia [name] agrees to follow up with our clinic in 3 to 4 weeks as directed to monitor her progress.  Diabetes risk counselling Cynthia [name] was given extended (15 minutes) diabetes prevention counseling today. She is 32 y.o. female and has risk factors for diabetes including obesity and insulin resistance. We discussed intensive lifestyle modifications today with an emphasis on weight loss as well as increasing exercise and decreasing simple carbohydrates in her diet.  Obesity Cynthia [name] is currently in the action stage of change. As such, her goal is to continue with weight loss efforts She has agreed to keep a food journal with 1300-1400 calories and 85+ grams of protein daily Cynthia [name] has been instructed to work up to a goal of 150 minutes of combined cardio and strengthening exercise per week for weight loss and overall health benefits. We discussed the following Behavioral Modification Strategies today: increasing lean protein intake, increasing vegetables, work on meal planning and easy cooking plans, better snacking choices, travel eating strategies, planning for  success, and keep a strict food journal    Cynthia [name]  has agreed to follow up with our clinic in 3 to 4 weeks. She was informed of the importance of frequent follow up visits to maximize her success with intensive lifestyle modifications for her multiple health conditions.   OBESITY BEHAVIORAL INTERVENTION VISIT  Today's visit was # 16   Starting weight: 264 lbs Starting date: 05/01/17 Today's weight : 277 lbs  Today's date: 03/25/2018 Total lbs lost to date: 0 At least 15 minutes were spent on discussing the following behavioral intervention visit.   ASK: We discussed the diagnosis of obesity with Cynthia [name] today and Cynthia [name] agreed to give [place] permission to discuss obesity behavioral modification therapy today.  ASSESS: [place] has the diagnosis of obesity and her BMI today is 82.37 Cynthia [name] is in the action stage of change   ADVISE: Cynthia [name] was educated on the multiple health risks of obesity as well as the benefit of weight loss to improve her health. She was advised of the need for long term treatment and the importance of lifestyle modifications to improve her current health and to decrease her risk of future health problems.  AGREE: Multiple dietary modification options and treatment options were discussed and  Fonnie agreed to follow the recommendations documented in the above note.  ARRANGE: Anarosa was educated on the importance of frequent visits to treat obesity as outlined per CMS and USPSTF guidelines and agreed to schedule her next follow up appointment today.  I, Burt Knack, am acting as transcriptionist for Debbra Riding, MD  I have reviewed the above documentation for accuracy and completeness, and I agree with the above. - Debbra Riding, MD

## 2018-03-25 NOTE — H&P (View-Only) (Signed)
 Office: [phone]  /  Fax: [phone]   HPI:   Chief Complaint: OBESITY Cynthia [name] is here to discuss her progress with her obesity treatment plan. She is on the Pescatarian eating plan and is following her eating plan approximately 65 % of the time. She states she is exercising 0 minutes 0 times per week. Cynthia [name] has had a good amount of celebrations, out of town guests, breakfast and lunch, and on plan. She is trying to make good choices at dinner and getting protein in . She is leaving for the beach in 4 days.  Her weight is 277 lb (125.6 kg) today and has gained 2 pounds since her last visit. She has lost 0 lbs since starting treatment with us.  Vitamin D Deficiency Cynthia [name] has a diagnosis of vitamin D deficiency. She is currently taking prescription Vit D. She notes fatigue and denies nausea, vomiting or muscle weakness.  Insulin Resistance Cynthia [name] has a diagnosis of insulin resistance based on her elevated fasting insulin level >5. Although Cynthia [name]'s blood glucose readings are still under good control, insulin resistance puts her at greater risk of metabolic syndrome and diabetes. She is taking metformin currently and she notes carbohydrate cravings intermittently. She continues to work on diet and exercise to decrease risk of diabetes.  At risk for diabetes Cynthia [name] is at higher than average risk for developing diabetes due to her obesity and insulin resistance. She currently denies polyuria or polydipsia.  ALLERGIES: Allergies  Allergen Reactions  . Aquacel [Carboxymethylcellulose] Itching, Rash and Other (See Comments)    Caused blisters  . Penicillins   . Oxycodone Itching and Rash    MEDICATIONS: Current Outpatient Medications on File Prior to Visit  Medication Sig Dispense Refill  . acetaminophen (TYLENOL) 500 MG tablet Take 1,000 mg by mouth every 6 (six) hours as needed.    . albuterol (PROAIR HFA) 108 (90 Base) MCG/ACT inhaler Inhale 1-2 puffs into the lungs every 6 (six)  hours as needed for wheezing or shortness of breath.    . desloratadine (CLARINEX) 5 MG tablet Take 1 tablet by mouth daily.  0  . fluticasone furoate-vilanterol (BREO ELLIPTA) 200-25 MCG/INH AEPB Inhale 1 puff into the lungs daily.    . HYDROcodone-acetaminophen (NORCO/VICODIN) 5-325 MG tablet Take 1 tablet by mouth every 6 (six) hours as needed for moderate pain.    . hyoscyamine (LEVSIN SL) 0.125 MG SL tablet Take 1 tablet by mouth as directed.  0  . ibuprofen (ADVIL,MOTRIN) 200 MG tablet Take 200 mg by mouth every 6 (six) hours as needed.    . methocarbamol (ROBAXIN) 500 MG tablet Take 500 mg by mouth 3 (three) times daily.    . Montelukast Sodium (SINGULAIR PO) Take by mouth.      . norethindrone-ethinyl estradiol (MICROGESTIN FE 1/20) 1-20 MG-MCG tablet Take 1 tablet by mouth daily. 3 Package 4  . olopatadine (PATANOL) 0.1 % ophthalmic solution 1 drop 2 (two) times daily.    . QNASL 80 MCG/ACT AERS as directed.  0  . traMADol (ULTRAM) 50 MG tablet as needed.  0  . traZODone (DESYREL) 100 MG tablet Take 100 mg by mouth at bedtime.      No current facility-administered medications on file prior to visit.     PAST MEDICAL HISTORY: Past Medical History:  Diagnosis Date  . ADHD   . ADHD (attention deficit hyperactivity disorder)   . Allergic rhinitis   . Anxiety   . Asthma   . Back injury      COMPETITIVE INJURY IN HIGH SCHOOL  . Back pain   . Depression   . Fatty liver   . Fibromyalgia   . Gallbladder problem   . GERD (gastroesophageal reflux disease)   . IBS (irritable bowel syndrome)   . Joint pain   . Pneumonia     PAST SURGICAL HISTORY: Past Surgical History:  Procedure Laterality Date  . CHOLECYSTECTOMY    . CORONARY ARTERY BYPASS GRAFT    . DG DILATION URETERS    . SHOULDER SURGERY  2002   RIGHT  . WRIST ARTHROSCOPY     Right    SOCIAL HISTORY: Social History   Tobacco Use  . Smoking status: Never Smoker  . Smokeless tobacco: Never Used  Substance Use  Topics  . Alcohol use: Yes  . Drug use: No    FAMILY HISTORY: Family History  Problem Relation Age of Onset  . Multiple sclerosis Mother   . Diabetes Mother   . Hyperlipidemia Mother   . Thyroid disease Mother   . Depression Mother   . Anxiety disorder Mother   . Obesity Mother   . Hypertension Father   . Diabetes Father   . Hyperlipidemia Father   . Obesity Father   . Diabetes Maternal Grandfather   . Cancer Maternal Grandfather        prostate  . Heart disease Maternal Grandfather   . Heart disease Paternal Grandfather     ROS: Review of Systems  Constitutional: Positive for malaise/fatigue. Negative for weight loss.  Gastrointestinal: Negative for nausea and vomiting.  Genitourinary: Negative for frequency.  Musculoskeletal:       Negative muscle weakness  Endo/Heme/Allergies: Negative for polydipsia.    PHYSICAL EXAM: Blood pressure 133/83, pulse (!) 113, temperature 97.9 F (36.6 C), temperature source Oral, height 5' 1" (1.549 m), weight 277 lb (125.6 kg), SpO2 98 %. Body mass index is 52.34 kg/m. Physical Exam  Constitutional: She is oriented to person, place, and time. She appears well-developed and well-nourished.  Cardiovascular: Normal rate.  Pulmonary/Chest: Effort normal.  Musculoskeletal: Normal range of motion.  Neurological: She is oriented to person, place, and time.  Skin: Skin is warm and dry.  Psychiatric: She has a normal mood and affect. Her behavior is normal.  Vitals reviewed.   RECENT LABS AND TESTS: BMET    Component Value Date/Time   NA 139 05/01/2017 1009   K 4.6 05/01/2017 1009   CL 104 05/01/2017 1009   CO2 22 05/01/2017 1009   GLUCOSE 80 05/01/2017 1009   GLUCOSE 88 01/09/2012 0956   BUN 10 05/01/2017 1009   CREATININE 0.77 05/01/2017 1009   CALCIUM 9.1 05/01/2017 1009   GFRNONAA 103 05/01/2017 1009   GFRAA 119 05/01/2017 1009   Lab Results  Component Value Date   HGBA1C 5.5 01/16/2018   HGBA1C 5.4 10/03/2017    HGBA1C 5.3 05/01/2017   Lab Results  Component Value Date   INSULIN 12.9 01/16/2018   INSULIN 21.7 10/03/2017   INSULIN 11.1 05/01/2017   CBC    Component Value Date/Time   WBC 6.8 05/01/2017 1009   WBC 7.8 01/09/2012 0956   RBC 4.67 05/01/2017 1009   RBC 5.06 01/09/2012 0956   HGB 14.0 05/01/2017 1009   HCT 43.3 05/01/2017 1009   PLT 285 01/09/2012 0956   MCV 93 05/01/2017 1009   MCH 30.0 05/01/2017 1009   MCH 29.6 01/09/2012 0956   MCHC 32.3 05/01/2017 1009   MCHC 33.3 01/09/2012 0956   RDW   13.7 05/01/2017 1009   LYMPHSABS 1.5 05/01/2017 1009   MONOABS 0.7 01/09/2012 0956   EOSABS 0.1 05/01/2017 1009   BASOSABS 0.0 05/01/2017 1009   Iron/TIBC/Ferritin/ %Sat No results found for: IRON, TIBC, FERRITIN, IRONPCTSAT Lipid Panel     Component Value Date/Time   CHOL 172 10/03/2017 0821   TRIG 73 10/03/2017 0821   HDL 53 10/03/2017 0821   CHOLHDL 3.4 01/09/2012 0956   VLDL 14 01/09/2012 0956   LDLCALC 104 (H) 10/03/2017 0821   Hepatic Function Panel     Component Value Date/Time   PROT 6.8 05/01/2017 1009   ALBUMIN 4.2 05/01/2017 1009   AST 15 05/01/2017 1009   ALT 14 05/01/2017 1009   ALKPHOS 61 05/01/2017 1009   BILITOT 0.3 05/01/2017 1009      Component Value Date/Time   TSH 1.640 05/01/2017 1009   TSH 1.850 01/09/2012 0956  Results for Heckstall, Jamel C (MRN [id number]) as of 03/25/2018 15:18  Ref. Range 01/16/2018 13:47  Vitamin D, 25-Hydroxy Latest Ref Range: 30.0 - 100.0 ng/mL 33.8    ASSESSMENT AND PLAN: Vitamin D deficiency - Plan: Vitamin D, Ergocalciferol, (DRISDOL) 50000 units CAPS capsule  Insulin resistance - Plan: metFORMIN (GLUCOPHAGE-XR) 500 MG 24 hr tablet  At risk for diabetes mellitus  Class 3 severe obesity with serious comorbidity and body mass index (BMI) of 50.0 to 59.9 in adult, unspecified obesity type (HCC)  PLAN:  Vitamin D Deficiency Cynthia [name] was informed that low vitamin D levels contributes to fatigue and are associated with  obesity, breast, and colon cancer. Cynthia [name] agrees to continue taking prescription Vit D @50,000 IU every week #4 and we will refill for 1 month. She will follow up for routine testing of vitamin D, at least 2-3 times per year. She was informed of the risk of over-replacement of vitamin D and agrees to not increase her dose unless she discusses this with us first. Cynthia [name] agrees to follow up with our clinic in 3 to 4 weeks.  Insulin Resistance Cynthia [name] will continue to work on weight loss, exercise, and decreasing simple carbohydrates in her diet to help decrease the risk of diabetes. We dicussed metformin including benefits and risks. She was informed that eating too many simple carbohydrates or too many calories at one sitting increases the likelihood of GI side effects. Treena agrees to continue taking metformin 500 mg PO q AM #30 and we will refill for 1 month. Cynthia [name] agrees to follow up with our clinic in 3 to 4 weeks as directed to monitor her progress.  Diabetes risk counselling Cynthia [name] was given extended (15 minutes) diabetes prevention counseling today. She is 32 y.o. female and has risk factors for diabetes including obesity and insulin resistance. We discussed intensive lifestyle modifications today with an emphasis on weight loss as well as increasing exercise and decreasing simple carbohydrates in her diet.  Obesity Cynthia [name] is currently in the action stage of change. As such, her goal is to continue with weight loss efforts She has agreed to keep a food journal with 1300-1400 calories and 85+ grams of protein daily Arlinda has been instructed to work up to a goal of 150 minutes of combined cardio and strengthening exercise per week for weight loss and overall health benefits. We discussed the following Behavioral Modification Strategies today: increasing lean protein intake, increasing vegetables, work on meal planning and easy cooking plans, better snacking choices, travel eating strategies, planning for  success, and keep a strict food journal    Cynthia [name]   has agreed to follow up with our clinic in 3 to 4 weeks. She was informed of the importance of frequent follow up visits to maximize her success with intensive lifestyle modifications for her multiple health conditions.   OBESITY BEHAVIORAL INTERVENTION VISIT  Today's visit was # 16   Starting weight: 264 lbs Starting date: 05/01/17 Today's weight : 277 lbs  Today's date: 03/25/2018 Total lbs lost to date: 0 At least 15 minutes were spent on discussing the following behavioral intervention visit.   ASK: We discussed the diagnosis of obesity with Cynthia [name] today and Cynthia [name] agreed to give us permission to discuss obesity behavioral modification therapy today.  ASSESS: Cynthia [name] has the diagnosis of obesity and her BMI today is 52.37 Cynthia [name] is in the action stage of change   ADVISE: Cynthia [name] was educated on the multiple health risks of obesity as well as the benefit of weight loss to improve her health. She was advised of the need for long term treatment and the importance of lifestyle modifications to improve her current health and to decrease her risk of future health problems.  AGREE: Multiple dietary modification options and treatment options were discussed and  Cynthia [name] agreed to follow the recommendations documented in the above note.  ARRANGE: Cynthia [name] was educated on the importance of frequent visits to treat obesity as outlined per CMS and USPSTF guidelines and agreed to schedule her next follow up appointment today.  I, Sharon Martin, am acting as transcriptionist for Cynthia [name] Blevins Kadolph, MD  I have reviewed the above documentation for accuracy and completeness, and I agree with the above. - Roshawn Lacina Kadolph, MD  

## 2018-04-02 ENCOUNTER — Telehealth (INDEPENDENT_AMBULATORY_CARE_PROVIDER_SITE_OTHER): Payer: Self-pay | Admitting: Family Medicine

## 2018-04-02 NOTE — Telephone Encounter (Signed)
Pt calls stating she is to have knee surgery next week; her dr's office just called her concerned that her PMH shows she has had a CABG; patient denies that she has had a CABG and would like this removed from her PMH. Please advise.

## 2018-04-06 ENCOUNTER — Other Ambulatory Visit (INDEPENDENT_AMBULATORY_CARE_PROVIDER_SITE_OTHER): Payer: Self-pay | Admitting: Family Medicine

## 2018-04-06 DIAGNOSIS — E559 Vitamin D deficiency, unspecified: Secondary | ICD-10-CM

## 2018-04-07 ENCOUNTER — Encounter (HOSPITAL_COMMUNITY): Payer: Self-pay

## 2018-04-07 NOTE — Patient Instructions (Signed)
Your procedure is scheduled on: Friday, Sept. 20, 2019   Surgery Time:  12:20PM-1:23PM   [facility]    Report to admitting at 9:45 AM   Call this number if you have problems the morning of surgery [phone]   Do not eat food or drink liquids :After Midnight.   Brush your teeth the morning of surgery.   Do NOT smoke after Midnight   Take these medicines the morning of surgery with A SIP OF WATER: Use Asthma Inhaler per normal routine, use nasal spray if needed   Bring rescue asthma inhaler day of surgery.  DO NOT TAKE ANY DIABETIC MEDICATIONS DAY OF YOUR SURGERY                               You may not have any metal on your body including hair pins, jewelry, and body piercings             Do not wear make-up, lotions, powders, perfumes/cologne, or deodorant             Do not wear nail polish.  Do not shave  48 hours prior to surgery.              Men may shave face and neck.   Do not bring valuables to the hospital. [facility] IS NOT             RESPONSIBLE   FOR VALUABLES.   Contacts, dentures or bridgework may not be worn into surgery.    Patients discharged the day of surgery will not be allowed to drive home.   Special Instructions: Bring a copy of your healthcare power of attorney and living will documents         the day of surgery if you haven't scanned them in before.              Please read over the following fact sheets you were given:  [facility] - Pre[facility] Before surgery, you can play an important role.  Because skin is not sterile, your skin needs to be as free of germs as possible.  You can reduce the number of germs on your skin by washing with CHG (chlorahexidine gluconate) soap before surgery.  CHG is an antiseptic cleaner which kills germs and bonds with the skin to continue killing germs even after washing. Please DO NOT use if you have an allergy to CHG or antibacterial soaps.  If your skin  becomes reddened/irritated stop using the CHG and inform your nurse when you arrive at Short Stay. Do not shave (including legs and underarms) for at least 48 hours prior to the first CHG shower.  You may shave your face/neck.  Please follow these instructions carefully:  1.  Shower with CHG Soap the night before surgery and the  morning of surgery.  2.  If you choose to wash your hair, wash your hair first as usual with your normal  shampoo.  3.  After you shampoo, rinse your hair and body thoroughly to remove the shampoo.                             4.  Use CHG as you would any other liquid soap.  You can apply chg directly to the skin and wash.  Gently with a scrungie or  clean washcloth.  5.  Apply the CHG Soap to your body ONLY FROM THE NECK DOWN.   Do   not use on face/ open                           Wound or open sores. Avoid contact with eyes, ears mouth and   genitals (private parts).                       Wash face,  Genitals (private parts) with your normal soap.             6.  Wash thoroughly, paying special attention to the area where your    surgery  will be performed.  7.  Thoroughly rinse your body with warm water from the neck down.  8.  DO NOT shower/wash with your normal soap after using and rinsing off the CHG Soap.                9.  Pat yourself dry with a clean towel.            10.  Wear clean pajamas.            11.  Place clean sheets on your bed the night of your first shower and do not  sleep with pets. Day of Surgery : Do not apply any lotions/deodorants the morning of surgery.  Please wear clean clothes to the hospital/surgery center.  FAILURE TO FOLLOW THESE INSTRUCTIONS MAY RESULT IN THE CANCELLATION OF YOUR SURGERY  PATIENT SIGNATURE_________________________________  NURSE SIGNATURE__________________________________  ________________________________________________________________________

## 2018-04-08 ENCOUNTER — Other Ambulatory Visit: Payer: Self-pay

## 2018-04-08 ENCOUNTER — Encounter (HOSPITAL_COMMUNITY): Payer: Self-pay

## 2018-04-08 ENCOUNTER — Encounter (HOSPITAL_COMMUNITY)
Admission: RE | Admit: 2018-04-08 | Discharge: 2018-04-08 | Disposition: A | Discharge status: Home / Self Care | Payer: BLUE CROSS/BLUE SHIELD | Source: Ambulatory Visit | Attending: Specialist | Admitting: Specialist

## 2018-04-08 DIAGNOSIS — Z01812 Encounter for preprocedural laboratory examination: Secondary | ICD-10-CM | POA: Insufficient documentation

## 2018-04-08 HISTORY — DX: Nausea with vomiting, unspecified: R11.2

## 2018-04-08 HISTORY — DX: Other specified postprocedural states: Z98.890

## 2018-04-08 HISTORY — DX: Other seasonal allergic rhinitis: J30.2

## 2018-04-08 HISTORY — DX: Unilateral primary osteoarthritis, unspecified knee: M17.10

## 2018-04-08 HISTORY — DX: Obesity, unspecified: E66.9

## 2018-04-08 HISTORY — DX: Insulin resistance, unspecified: E88.819

## 2018-04-08 HISTORY — DX: Migraine, unspecified, not intractable, without status migrainosus: G43.909

## 2018-04-08 HISTORY — DX: Metabolic syndrome: E88.81

## 2018-04-08 HISTORY — DX: Osteoarthritis of knee, unspecified: M17.9

## 2018-04-08 HISTORY — DX: Vitamin D deficiency, unspecified: E55.9

## 2018-04-08 LAB — BASIC METABOLIC PANEL
Anion gap: 10 (ref 5–15)
BUN: 12 mg/dL (ref 6–20)
CO2: 25 mmol/L (ref 22–32)
Calcium: 9.1 mg/dL (ref 8.9–10.3)
Chloride: 107 mmol/L (ref 98–111)
Creatinine, Ser: 0.77 mg/dL (ref 0.44–1.00)
GFR calc Af Amer: >60 mL/min (ref >60)
GFR calc non Af Amer: >60 mL/min (ref >60)
Glucose, Bld: 87 mg/dL (ref 70–99)
Potassium: 4.6 mmol/L (ref 3.5–5.1)
Sodium: 142 mmol/L (ref 135–145)

## 2018-04-08 LAB — CBC
HCT: 47.3 % — ABNORMAL HIGH (ref 36.0–46.0)
Hemoglobin: 15.5 g/dL — ABNORMAL HIGH (ref 12.0–15.0)
MCH: 30.2 pg (ref 26.0–34.0)
MCHC: 32.8 g/dL (ref 30.0–36.0)
MCV: 92 fL (ref 78.0–100.0)
Platelets: 318 10*3/uL (ref 150–400)
RBC: 5.14 MIL/uL — ABNORMAL HIGH (ref 3.87–5.11)
RDW: 13.7 % (ref 11.5–15.5)
WBC: 10.5 10*3/uL (ref 4.0–10.5)

## 2018-04-08 NOTE — Pre-Procedure Instructions (Signed)
CBC results 04/11/18 faxed to Dr. Thomasena Edisollins via epic.

## 2018-04-09 ENCOUNTER — Ambulatory Visit: Payer: Self-pay | Admitting: Orthopedic Surgery

## 2018-04-10 MED ORDER — VANCOMYCIN HCL 10 G IV SOLR
1500.0000 mg | INTRAVENOUS | Status: AC
  Administered 2018-04-11: 1500 mg via INTRAVENOUS
  Filled 2018-04-10: qty 1500

## 2018-04-11 ENCOUNTER — Encounter (HOSPITAL_COMMUNITY): Payer: Self-pay | Admitting: Certified Registered Nurse Anesthetist

## 2018-04-11 ENCOUNTER — Encounter (HOSPITAL_COMMUNITY)
Admission: RE | Disposition: A | Discharge status: Home / Self Care | Payer: Self-pay | Source: Ambulatory Visit | Attending: Specialist

## 2018-04-11 ENCOUNTER — Ambulatory Visit (HOSPITAL_COMMUNITY): Payer: BLUE CROSS/BLUE SHIELD | Admitting: Certified Registered Nurse Anesthetist

## 2018-04-11 ENCOUNTER — Ambulatory Visit (HOSPITAL_COMMUNITY)
Admission: RE | Admit: 2018-04-11 | Discharge: 2018-04-11 | Disposition: A | Discharge status: Home / Self Care | Payer: BLUE CROSS/BLUE SHIELD | Source: Ambulatory Visit | Attending: Specialist | Admitting: Specialist

## 2018-04-11 DIAGNOSIS — Z9889 Other specified postprocedural states: Secondary | ICD-10-CM

## 2018-04-11 DIAGNOSIS — Z7984 Long term (current) use of oral hypoglycemic drugs: Secondary | ICD-10-CM | POA: Insufficient documentation

## 2018-04-11 DIAGNOSIS — Z79899 Other long term (current) drug therapy: Secondary | ICD-10-CM | POA: Diagnosis not present

## 2018-04-11 DIAGNOSIS — S83272A Complex tear of lateral meniscus, current injury, left knee, initial encounter: Secondary | ICD-10-CM | POA: Diagnosis not present

## 2018-04-11 DIAGNOSIS — S83271A Complex tear of lateral meniscus, current injury, right knee, initial encounter: Secondary | ICD-10-CM | POA: Insufficient documentation

## 2018-04-11 DIAGNOSIS — E8881 Metabolic syndrome: Secondary | ICD-10-CM | POA: Insufficient documentation

## 2018-04-11 DIAGNOSIS — K219 Gastro-esophageal reflux disease without esophagitis: Secondary | ICD-10-CM | POA: Diagnosis not present

## 2018-04-11 DIAGNOSIS — Z951 Presence of aortocoronary bypass graft: Secondary | ICD-10-CM | POA: Diagnosis not present

## 2018-04-11 DIAGNOSIS — Z880 Allergy status to penicillin: Secondary | ICD-10-CM | POA: Insufficient documentation

## 2018-04-11 DIAGNOSIS — Y929 Unspecified place or not applicable: Secondary | ICD-10-CM | POA: Diagnosis not present

## 2018-04-11 DIAGNOSIS — F419 Anxiety disorder, unspecified: Secondary | ICD-10-CM | POA: Diagnosis not present

## 2018-04-11 DIAGNOSIS — M94262 Chondromalacia, left knee: Secondary | ICD-10-CM | POA: Diagnosis not present

## 2018-04-11 DIAGNOSIS — K589 Irritable bowel syndrome without diarrhea: Secondary | ICD-10-CM | POA: Diagnosis not present

## 2018-04-11 DIAGNOSIS — E559 Vitamin D deficiency, unspecified: Secondary | ICD-10-CM | POA: Diagnosis not present

## 2018-04-11 DIAGNOSIS — Z6841 Body Mass Index (BMI) 40.0 and over, adult: Secondary | ICD-10-CM | POA: Insufficient documentation

## 2018-04-11 DIAGNOSIS — F909 Attention-deficit hyperactivity disorder, unspecified type: Secondary | ICD-10-CM | POA: Diagnosis not present

## 2018-04-11 DIAGNOSIS — X58XXXA Exposure to other specified factors, initial encounter: Secondary | ICD-10-CM | POA: Diagnosis not present

## 2018-04-11 DIAGNOSIS — J45909 Unspecified asthma, uncomplicated: Secondary | ICD-10-CM | POA: Diagnosis not present

## 2018-04-11 DIAGNOSIS — F329 Major depressive disorder, single episode, unspecified: Secondary | ICD-10-CM | POA: Insufficient documentation

## 2018-04-11 DIAGNOSIS — M797 Fibromyalgia: Secondary | ICD-10-CM | POA: Diagnosis not present

## 2018-04-11 DIAGNOSIS — S83281A Other tear of lateral meniscus, current injury, right knee, initial encounter: Secondary | ICD-10-CM | POA: Diagnosis not present

## 2018-04-11 HISTORY — PX: KNEE ARTHROSCOPY WITH LATERAL MENISECTOMY: SHX6193

## 2018-04-11 LAB — PREGNANCY, URINE: Preg Test, Ur: NEGATIVE

## 2018-04-11 LAB — GLUCOSE, CAPILLARY: Glucose-Capillary: 102 mg/dL — ABNORMAL HIGH (ref 70–99)

## 2018-04-11 SURGERY — ARTHROSCOPY, KNEE, WITH LATERAL MENISCECTOMY
Anesthesia: General | Laterality: Right

## 2018-04-11 MED ORDER — DEXAMETHASONE SODIUM PHOSPHATE 10 MG/ML IJ SOLN
INTRAMUSCULAR | Status: DC | PRN
  Administered 2018-04-11: 10 mg via INTRAVENOUS

## 2018-04-11 MED ORDER — OXYCODONE HCL 5 MG PO TABS: 5.0000 mg | ORAL_TABLET | Freq: Once | ORAL | Status: DC | PRN

## 2018-04-11 MED ORDER — FENTANYL CITRATE (PF) 100 MCG/2ML IJ SOLN
INTRAMUSCULAR | Status: DC | PRN
  Administered 2018-04-11 (×7): 50 ug via INTRAVENOUS

## 2018-04-11 MED ORDER — TRIAMCINOLONE ACETONIDE 40 MG/ML IJ SUSP
INTRAMUSCULAR | Status: AC
  Filled 2018-04-11: qty 1

## 2018-04-11 MED ORDER — FENTANYL CITRATE (PF) 100 MCG/2ML IJ SOLN
INTRAMUSCULAR | Status: AC
  Administered 2018-04-11: 50 ug via INTRAVENOUS
  Filled 2018-04-11: qty 2

## 2018-04-11 MED ORDER — FENTANYL CITRATE (PF) 100 MCG/2ML IJ SOLN
25.0000 ug | INTRAMUSCULAR | Status: DC | PRN
  Administered 2018-04-11 (×2): 50 ug via INTRAVENOUS

## 2018-04-11 MED ORDER — ONDANSETRON HCL 4 MG/2ML IJ SOLN: 4.0000 mg | Freq: Once | INTRAMUSCULAR | Status: DC | PRN

## 2018-04-11 MED ORDER — LACTATED RINGERS IV SOLN
INTRAVENOUS | Status: DC
  Administered 2018-04-11: 11:00:00 via INTRAVENOUS

## 2018-04-11 MED ORDER — LIDOCAINE 2% (20 MG/ML) 5 ML SYRINGE
INTRAMUSCULAR | Status: AC
  Filled 2018-04-11: qty 5

## 2018-04-11 MED ORDER — ROCURONIUM BROMIDE 10 MG/ML (PF) SYRINGE
PREFILLED_SYRINGE | INTRAVENOUS | Status: AC
  Filled 2018-04-11: qty 10

## 2018-04-11 MED ORDER — SCOPOLAMINE 1 MG/3DAYS TD PT72
MEDICATED_PATCH | TRANSDERMAL | Status: AC
  Filled 2018-04-11: qty 1

## 2018-04-11 MED ORDER — ONDANSETRON HCL 4 MG/2ML IJ SOLN
INTRAMUSCULAR | Status: AC
  Filled 2018-04-11: qty 2

## 2018-04-11 MED ORDER — FENTANYL CITRATE (PF) 100 MCG/2ML IJ SOLN
INTRAMUSCULAR | Status: AC
  Filled 2018-04-11: qty 2

## 2018-04-11 MED ORDER — FENTANYL CITRATE (PF) 250 MCG/5ML IJ SOLN
INTRAMUSCULAR | Status: AC
  Filled 2018-04-11: qty 5

## 2018-04-11 MED ORDER — ONDANSETRON HCL 4 MG/2ML IJ SOLN
INTRAMUSCULAR | Status: DC | PRN
  Administered 2018-04-11: 4 mg via INTRAVENOUS

## 2018-04-11 MED ORDER — DOXYCYCLINE HYCLATE 100 MG PO CAPS: 100.0000 mg | ORAL_CAPSULE | Freq: Two times a day (BID) | ORAL | 0 refills | Status: AC

## 2018-04-11 MED ORDER — LIDOCAINE 2% (20 MG/ML) 5 ML SYRINGE
INTRAMUSCULAR | Status: DC | PRN
  Administered 2018-04-11: 80 mg via INTRAVENOUS

## 2018-04-11 MED ORDER — SCOPOLAMINE 1 MG/3DAYS TD PT72
1.0000 | MEDICATED_PATCH | TRANSDERMAL | Status: DC
  Administered 2018-04-11: 1.5 mg via TRANSDERMAL

## 2018-04-11 MED ORDER — PROPOFOL 10 MG/ML IV BOLUS
INTRAVENOUS | Status: AC
  Filled 2018-04-11: qty 20

## 2018-04-11 MED ORDER — PROPOFOL 10 MG/ML IV BOLUS
INTRAVENOUS | Status: DC | PRN
  Administered 2018-04-11: 200 mg via INTRAVENOUS

## 2018-04-11 MED ORDER — ACETAMINOPHEN 325 MG PO TABS: 325.0000 mg | ORAL_TABLET | ORAL | Status: DC | PRN

## 2018-04-11 MED ORDER — LACTATED RINGERS IR SOLN
Status: DC | PRN
  Administered 2018-04-11: 12000 mL

## 2018-04-11 MED ORDER — TRIAMCINOLONE ACETONIDE 40 MG/ML IJ SUSP
INTRAMUSCULAR | Status: DC | PRN
  Administered 2018-04-11: 40 mg via INTRAMUSCULAR

## 2018-04-11 MED ORDER — BUPIVACAINE HCL (PF) 0.25 % IJ SOLN
INTRAMUSCULAR | Status: DC | PRN
  Administered 2018-04-11: 20 mL

## 2018-04-11 MED ORDER — MEPERIDINE HCL 50 MG/ML IJ SOLN: 6.2500 mg | INTRAMUSCULAR | Status: DC | PRN

## 2018-04-11 MED ORDER — DEXAMETHASONE SODIUM PHOSPHATE 10 MG/ML IJ SOLN
INTRAMUSCULAR | Status: AC
  Filled 2018-04-11: qty 1

## 2018-04-11 MED ORDER — OXYCODONE HCL 5 MG/5ML PO SOLN: 5.0000 mg | Freq: Once | ORAL | Status: DC | PRN

## 2018-04-11 MED ORDER — BUPIVACAINE HCL (PF) 0.25 % IJ SOLN
INTRAMUSCULAR | Status: AC
  Filled 2018-04-11: qty 30

## 2018-04-11 MED ORDER — PHENYLEPHRINE 40 MCG/ML (10ML) SYRINGE FOR IV PUSH (FOR BLOOD PRESSURE SUPPORT)
PREFILLED_SYRINGE | INTRAVENOUS | Status: AC
  Filled 2018-04-11: qty 10

## 2018-04-11 MED ORDER — CHLORHEXIDINE GLUCONATE 4 % EX LIQD: 60.0000 mL | Freq: Once | CUTANEOUS | Status: DC

## 2018-04-11 MED ORDER — MIDAZOLAM HCL 2 MG/2ML IJ SOLN
INTRAMUSCULAR | Status: AC
  Filled 2018-04-11: qty 2

## 2018-04-11 MED ORDER — ASPIRIN EC 325 MG PO TBEC: 325.0000 mg | DELAYED_RELEASE_TABLET | Freq: Two times a day (BID) | ORAL | 3 refills | Status: DC

## 2018-04-11 MED ORDER — PROPOFOL 10 MG/ML IV BOLUS
INTRAVENOUS | Status: AC
  Filled 2018-04-11: qty 40

## 2018-04-11 MED ORDER — HYDROMORPHONE HCL 2 MG PO TABS: 2.0000 mg | ORAL_TABLET | Freq: Two times a day (BID) | ORAL | 0 refills | Status: AC | PRN

## 2018-04-11 MED ORDER — MIDAZOLAM HCL 5 MG/5ML IJ SOLN
INTRAMUSCULAR | Status: DC | PRN
  Administered 2018-04-11: 2 mg via INTRAVENOUS

## 2018-04-11 MED ORDER — ACETAMINOPHEN 160 MG/5ML PO SOLN: 325.0000 mg | ORAL | Status: DC | PRN

## 2018-04-11 SURGICAL SUPPLY — 35 items
BANDAGE ACE 6X5 VEL STRL LF (GAUZE/BANDAGES/DRESSINGS) ×4 IMPLANT
BLADE GREAT WHITE 4.2 (BLADE) ×4 IMPLANT
BNDG ESMARK 4X9 LF (GAUZE/BANDAGES/DRESSINGS) ×2 IMPLANT
BNDG GAUZE ELAST 4 BULKY (GAUZE/BANDAGES/DRESSINGS) ×2 IMPLANT
COVER SURGICAL LIGHT HANDLE (MISCELLANEOUS) IMPLANT
CUFF TOURN SGL QUICK 34 (TOURNIQUET CUFF) ×1
CUFF TRNQT CYL 34X4X40X1 (TOURNIQUET CUFF) ×1 IMPLANT
DECANTER SPIKE VIAL GLASS SM (MISCELLANEOUS) IMPLANT
DRAPE INCISE IOBAN 66X45 STRL (DRAPES) IMPLANT
DRAPE U-SHAPE 47X51 STRL (DRAPES) ×2 IMPLANT
DRSG PAD ABDOMINAL 8X10 ST (GAUZE/BANDAGES/DRESSINGS) IMPLANT
DURAPREP 26ML APPLICATOR (WOUND CARE) ×2 IMPLANT
GAUZE SPONGE 4X4 12PLY STRL (GAUZE/BANDAGES/DRESSINGS) ×2 IMPLANT
GAUZE XEROFORM 1X8 LF (GAUZE/BANDAGES/DRESSINGS) ×2 IMPLANT
GLOVE BIOGEL PI IND STRL 8 (GLOVE) ×4 IMPLANT
GLOVE BIOGEL PI INDICATOR 8 (GLOVE) ×4
GLOVE SURG ORTHO 8.0 STRL STRW (GLOVE) IMPLANT
GLOVE SURG SS PI 7.5 STRL IVOR (GLOVE) ×4 IMPLANT
GOWN STRL REUS W/TWL XL LVL3 (GOWN DISPOSABLE) ×4 IMPLANT
KIT BASIN OR (CUSTOM PROCEDURE TRAY) ×2 IMPLANT
MANIFOLD NEPTUNE II (INSTRUMENTS) ×2 IMPLANT
MINI VAC (SURGICAL WAND) IMPLANT
NEEDLE HYPO 22GX1.5 SAFETY (NEEDLE) ×2 IMPLANT
PACK ARTHROSCOPY WL (CUSTOM PROCEDURE TRAY) ×2 IMPLANT
PAD ABD 8X10 STRL (GAUZE/BANDAGES/DRESSINGS) ×6 IMPLANT
PAD MASON LEG HOLDER (PIN) IMPLANT
PROBE BIPOLAR 50 DEGREE SUCT (MISCELLANEOUS) ×2 IMPLANT
SUT ETHILON 4 0 PS 2 18 (SUTURE) ×2 IMPLANT
SYR CONTROL 10ML LL (SYRINGE) ×2 IMPLANT
TOWEL OR 17X26 10 PK STRL BLUE (TOWEL DISPOSABLE) ×2 IMPLANT
TUBING ARTHRO INFLOW-ONLY STRL (TUBING) ×2 IMPLANT
TUBING CONNECTING 10 (TUBING) ×2 IMPLANT
WAND HAND CNTRL MULTIVAC 90 (MISCELLANEOUS) IMPLANT
WATER STERILE IRR 500ML POUR (IV SOLUTION) ×2 IMPLANT
WRAP KNEE MAXI GEL POST OP (GAUZE/BANDAGES/DRESSINGS) ×2 IMPLANT

## 2018-04-11 NOTE — Anesthesia Procedure Notes (Signed)
Procedure Name: LMA Insertion Date/Time: 04/11/2018 1:42 PM Performed by: Lorelee MarketEdathil, Kianni Lheureux T, CRNA Pre-anesthesia Checklist: Patient identified, Emergency Drugs available, Suction available, Patient being monitored and Timeout performed Patient Re-evaluated:Patient Re-evaluated prior to induction Oxygen Delivery Method: Circle system utilized Preoxygenation: Pre-oxygenation with 100% oxygen Induction Type: IV induction LMA: LMA inserted LMA Size: 3.0 Number of attempts: 1 Tube secured with: Tape Dental Injury: Teeth and Oropharynx as per pre-operative assessment

## 2018-04-11 NOTE — Transfer of Care (Signed)
Immediate Anesthesia Transfer of Care Note  Patient: Cynthia MaffucciSarah C Muscarella  Procedure(s) Performed: RIGHT KNEE ARTHROSCOPY WITH PARTIAL MENISECTOMY CHONDROPLASTY (Right )  Patient Location: PACU  Anesthesia Type:General  Level of Consciousness: awake, alert , oriented and patient cooperative  Airway & Oxygen Therapy: Patient Spontanous Breathing and Patient connected to face mask oxygen  Post-op Assessment: Report given to RN, Post -op Vital signs reviewed and stable and Patient moving all extremities  Post vital signs: Reviewed and stable  Last Vitals:  Vitals Value Taken Time  BP 166/90 04/11/2018  2:45 PM  Temp    Pulse 110 04/11/2018  2:45 PM  Resp 9 04/11/2018  2:45 PM  SpO2 100 % 04/11/2018  2:45 PM  Vitals shown include unvalidated device data.  Last Pain:  Vitals:   04/11/18 1001  TempSrc: Oral         Complications: No apparent anesthesia complications

## 2018-04-11 NOTE — Op Note (Signed)
Dictated#[id number]

## 2018-04-11 NOTE — Op Note (Signed)
NAME: [name], Cynthia C. MEDICAL RECORD IO:[id number] ACCSimonne MaffucciOUNT [id number].:[id number] DATE OF BIRTH:11-Aug-1985 FACILITY: [facility] LOCATION: [facility] PHYSICIAN:Mayerly Kaman Jim DesanctisA. Vu Liebman, MD  OPERATIVE REPORT  DATE OF PROCEDURE:  04/11/2018  PREOPERATIVE DIAGNOSIS:  Right knee torn lateral meniscus.  POSTOPERATIVE DIAGNOSIS: 1.  Right knee large complex tear of the lateral meniscus with a large pedunculated flap. 2.  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  PROCEDURE: 1.  Right knee arthroscopic partial lateral meniscectomy. 2.  Chondroplasty of lateral tibial plateau.  SURGEON:  Valma CavaAndrew Dailan Pfalzgraf, MD  ASSISTANT:  Arsenio LoaderBryson Stilwell PA-C.  ANESTHESIA:  General with intraoperative knee block.  ESTIMATED BLOOD LOSS:  Minimal.  DRAINS:  None.  SPECIMENS:  None.  TOURNIQUET TIME:  22 minutes at 350 mmHg.    DISPOSITION:   PACU stable.  DESCRIPTION OF PROCEDURE:  The patient was counseled in the holding area.  The correct site was identified and marked site appropriately.  IV started.  Sedation given.  IV antibiotics were given.  He was taken to the operating room and placed in supine  position under general anesthesia.  All extremities well-padded bump due to her large body habitus with extreme caution given to her prominences and regions.  Right lower extremity elevated.  She was then prepped with DuraPrep and draped in usual sterile  fashion.  Time-out done confirming the right side.  I exsanguinated with an Esmarch, tourniquet inflated to 350 mmHg.  Arthroscopic portals were established proximal medial, inferomedial, inferolateral diagnostic arthroscopy revealed suprapatellar  pouch, medial and lateral gutters unremarkable.  Patellofemoral joint normal tracking articular cartilage.  ACL and PCL intact.  Medial compartment inspected.  Normal articular cartilage, normal meniscus.  Lateral side inspected.  It looked like when she  was a broken bucket handle tear of the lateral meniscus with a large  pedunculated flap still contracted anteriorly with displacement between the meniscal tibial ligament.  It was reduced, found to be irreparable.  Utilizing a shaver basket and cautery  system, a partial lateral meniscectomy was performed back to a stable base popliteal level contoured.  There was also grade 3-4 chondromalacia and a focal defect, 1 cm x 1.5 cm, in the mid posterior aspect of the lateral tibial plateau and a light  chondroplasty with a mechanical shaver back to stable base.  There were no other abnormalities noted.  Endoscope was removed.  The portals were closed with #1 suture.  Ten mL of Sensorcaine placed in the skin and knee.  A sterile dressing applied.   Tourniquet deflated normal circulation with no complications.    She is awakened and taken from the operating room to the PACU in stable condition.  She will be stabilized in PACU and then discharged to home.  AN/NUANCE  D:04/11/2018 T:04/11/2018 JOB:[id number]

## 2018-04-11 NOTE — Interval H&P Note (Signed)
History and Physical Interval Note:  04/11/2018 12:34 PM  Cynthia MaffucciSarah C Louth  has presented today for surgery, with the diagnosis of Right knee torn lateral meniscus/osteoarthritis  The various methods of treatment have been discussed with the patient and family. After consideration of risks, benefits and other options for treatment, the patient has consented to  Procedure(s): RIGHT KNEE ARTHROSCOPY WITH LATERAL MENISECTOMY (Right) as a surgical intervention .  The patient's history has been reviewed, patient examined, no change in status, stable for surgery.  I have reviewed the patient's chart and labs.  Questions were answered to the patient's satisfaction.     [name]

## 2018-04-11 NOTE — Anesthesia Preprocedure Evaluation (Signed)
Anesthesia Evaluation  Patient identified by MRN, date of birth, ID band Patient awake    Reviewed: Allergy & Precautions, H&P , NPO status , Patient's Chart, lab work & pertinent test results, reviewed documented beta blocker date and time   History of Anesthesia Complications (+) PONV and history of anesthetic complications  Airway Mallampati: II  TM Distance: >3 FB Neck ROM: full    Dental no notable dental hx.    Pulmonary    Pulmonary exam normal breath sounds clear to auscultation       Cardiovascular Exercise Tolerance: Good negative cardio ROS   Rhythm:regular Rate:Normal     Neuro/Psych  Headaches, PSYCHIATRIC DISORDERS Anxiety Depression  Neuromuscular disease    GI/Hepatic Neg liver ROS, GERD  ,  Endo/Other  Morbid obesity  Renal/GU negative Renal ROS  negative genitourinary   Musculoskeletal  (+) Arthritis , Fibromyalgia -  Abdominal (+) + obese,   Peds  Hematology negative hematology ROS (+)   Anesthesia Other Findings   Reproductive/Obstetrics negative OB ROS                            Anesthesia Physical Anesthesia Plan  ASA: II  Anesthesia Plan: General   Post-op Pain Management:    Induction: Intravenous  PONV Risk Score and Plan: 4 or greater and Ondansetron, Dexamethasone, Scopolamine patch - Pre-op and Treatment may vary due to age or medical condition  Airway Management Planned: LMA  Additional Equipment:   Intra-op Plan:   Post-operative Plan: Extubation in OR  Informed Consent: I have reviewed the patients History and Physical, chart, labs and discussed the procedure including the risks, benefits and alternatives for the proposed anesthesia with the patient or authorized representative who has indicated his/her understanding and acceptance.   Dental Advisory Given  Plan Discussed with: CRNA, Anesthesiologist and Surgeon  Anesthesia Plan Comments:  ( )        Anesthesia Quick Evaluation

## 2018-04-11 NOTE — H&P (Signed)
Cynthia [name] is an 32 y.o. female.   Chief Complaint: right knee pain HPI: Patient presents with joint discomfort that had been persistent for several weeks now. Despite conservative treatments, the patients discomfort has not improved. Imaging was obtained. Other conservative and surgical treatments were discussed in detail. Patient wishes to proceed with surgery as consented. Previous left knee MPFL reconstruction. Denies SOB, CP, or calf pain. No Fever, chills, or nausea/ vomiting.   Past Medical History:  Diagnosis Date  . ADHD (attention deficit hyperactivity disorder)   . Allergic rhinitis   . Anxiety   . Asthma   . Back injury    COMPETITIVE INJURY IN HIGH SCHOOL  . Back pain   . Depression   . Fatty liver   . Fibromyalgia   . Gallbladder problem   . GERD (gastroesophageal reflux disease)   . IBS (irritable bowel syndrome)   . Insulin resistance   . Joint pain   . Migraines   . OA (osteoarthritis) of knee   . Obesity   . Pneumonia   . PONV (postoperative nausea and vomiting)   . Seasonal allergies   . Vitamin D deficiency     Past Surgical History:  Procedure Laterality Date  . CHOLECYSTECTOMY    . DG DILATION URETERS    . KNEE ARTHROSCOPY Left   . MOUTH SURGERY    . SHOULDER SURGERY  2002   RIGHT  . WISDOM TOOTH EXTRACTION    . WRIST ARTHROSCOPY     Right    Family History  Problem Relation Age of Onset  . Multiple sclerosis Mother   . Diabetes Mother   . Hyperlipidemia Mother   . Thyroid disease Mother   . Depression Mother   . Anxiety disorder Mother   . Obesity Mother   . Hypertension Father   . Diabetes Father   . Hyperlipidemia Father   . Obesity Father   . Diabetes Maternal Grandfather   . Cancer Maternal Grandfather        prostate  . Heart disease Maternal Grandfather   . Heart disease Paternal Grandfather    Social History:  reports that she has never smoked. She has never used smokeless tobacco. She reports that she drinks alcohol.  She reports that she does not use drugs.  Allergies:  Allergies  Allergen Reactions  . Aquacel [Carboxymethylcellulose] Itching, Rash and Other (See Comments)    Caused blisters  . Penicillins     Unknown reaction Has patient had a PCN reaction causing immediate rash, facial/tongue/throat swelling, SOB or lightheadedness with hypotension: Unknown Has patient had a PCN reaction causing severe rash involving mucus membranes or skin necrosis: Unknown Has patient had a PCN reaction that required hospitalization: No Has patient had a PCN reaction occurring within the last 10 years: No If all of the above answers are "NO", then may proceed with Cephalosporin use.   . Adhesive [Tape] Rash    Prefers to use paper tape  . Latex Rash  . Oxycodone Itching and Rash    Medications Prior to Admission  Medication Sig Dispense Refill  . acetaminophen (TYLENOL) 500 MG tablet Take 1,000 mg by mouth 3 (three) times daily as needed for moderate pain.     Marland Kitchen albuterol (PROAIR HFA) 108 (90 Base) MCG/ACT inhaler Inhale 2-4 puffs into the lungs every 6 (six) hours as needed for wheezing or shortness of breath.     Marland Kitchen alum hydroxide-mag trisilicate (GAVISCON) 80-20 MG CHEW chewable tablet Chew 1-2 tablets  by mouth daily as needed for indigestion or heartburn.    . desloratadine (CLARINEX) 5 MG tablet Take 5 mg by mouth at bedtime.   0  . fluticasone furoate-vilanterol (BREO ELLIPTA) 200-25 MCG/INH AEPB Inhale 1 puff into the lungs daily.    Marland Kitchen. HYDROcodone-acetaminophen (NORCO/VICODIN) 5-325 MG tablet Take 1 tablet by mouth daily as needed for severe pain.     . hyoscyamine (LEVSIN SL) 0.125 MG SL tablet Take 0.125 mg by mouth 3 (three) times daily with meals.   0  . ibuprofen (ADVIL,MOTRIN) 200 MG tablet Take 400-800 mg by mouth 3 (three) times daily as needed for moderate pain.     . methocarbamol (ROBAXIN) 500 MG tablet Take 500 mg by mouth 2 (two) times daily as needed for muscle spasms.     . montelukast  (SINGULAIR) 10 MG tablet Take 10 mg by mouth at bedtime.    . norethindrone-ethinyl estradiol (MICROGESTIN FE 1/20) 1-20 MG-MCG tablet Take 1 tablet by mouth daily. (Patient taking differently: Take 1 tablet by mouth at bedtime. ) 3 Package 4  . olopatadine (PATANOL) 0.1 % ophthalmic solution Place 1 drop into both eyes daily as needed for allergies.     Marland Kitchen. traMADol (ULTRAM) 50 MG tablet Take 50 mg by mouth every 6 (six) hours as needed for moderate pain or severe pain.   0  . traZODone (DESYREL) 100 MG tablet Take 100 mg by mouth at bedtime.     . Vitamin D, Ergocalciferol, (DRISDOL) 50000 units CAPS capsule Take 1 capsule (50,000 Units total) by mouth every 7 (seven) days. 4 capsule 0  . metFORMIN (GLUCOPHAGE-XR) 500 MG 24 hr tablet Take 1 tablet (500 mg total) by mouth daily with breakfast. (Patient taking differently: Take 500 mg by mouth at bedtime. ) 30 tablet 0  . QNASL 80 MCG/ACT AERS Place 1 spray into both nostrils daily as needed (allergies).   0    Results for orders placed or performed during the hospital encounter of 04/11/18 (from the past 48 hour(s))  Glucose, capillary     Status: Abnormal   Collection Time: 04/11/18 10:06 AM  Result Value Ref Range   Glucose-Capillary 102 (H) 70 - 99 mg/dL   No results found.  Review of Systems  Constitutional: Negative.   HENT: Negative.   Eyes: Negative.   Respiratory: Negative.   Cardiovascular: Negative.   Gastrointestinal: Negative.   Genitourinary: Negative.   Musculoskeletal: Positive for joint pain.  Skin: Negative.   Neurological: Negative.   Endo/Heme/Allergies: Negative.   Psychiatric/Behavioral: Negative.     Blood pressure (!) 146/97, pulse (!) 117, temperature 98.7 F (37.1 C), temperature source Oral, resp. rate 18, last menstrual period 04/02/2018, SpO2 95 %. Physical Exam  Constitutional: She is oriented to person, place, and time. She appears well-developed.  HENT:  Head: Normocephalic.  Eyes: EOM are normal.   Neck: Normal range of motion.  Cardiovascular: Normal rate and intact distal pulses.  Respiratory: Effort normal.  GI: Soft.  Genitourinary:  Genitourinary Comments: Deferred  Musculoskeletal:  Right knee pain. Limited ROM. Limited strength. Calf is soft.  Neurological: She is alert and oriented to person, place, and time.  Skin: Skin is warm and dry.  Psychiatric: Her behavior is normal.     Assessment/Plan Right knee MT: Right knee arthroscopy as consented D/c home today Follow instructions F/u in office in 7-10 days ASA BID     Shadaya Marschner L, PA-C 04/11/2018, 10:21 AM

## 2018-04-13 NOTE — Anesthesia Postprocedure Evaluation (Signed)
Anesthesia Post Note  Patient: Simonne MaffucciSarah C Mcneill  Procedure(s) Performed: RIGHT KNEE ARTHROSCOPY WITH PARTIAL MENISECTOMY CHONDROPLASTY (Right )     Patient location during evaluation: PACU Anesthesia Type: General Level of consciousness: awake and alert Pain management: pain level controlled Vital Signs Assessment: post-procedure vital signs reviewed and stable Respiratory status: spontaneous breathing, nonlabored ventilation, respiratory function stable and patient connected to nasal cannula oxygen Cardiovascular status: blood pressure returned to baseline and stable Postop Assessment: no apparent nausea or vomiting Anesthetic complications: no    Last Vitals:  Vitals:   04/11/18 1545 04/11/18 1600  BP: 119/72 118/84  Pulse: 99 (!) 102  Resp: 17   Temp: 37.1 C   SpO2: 96% 94%    Last Pain:  Vitals:   04/11/18 1600  TempSrc:   PainSc: 2                  Acel Natzke

## 2018-04-14 ENCOUNTER — Encounter (HOSPITAL_COMMUNITY): Payer: Self-pay | Admitting: Specialist

## 2018-04-18 DIAGNOSIS — M25561 Pain in right knee: Secondary | ICD-10-CM | POA: Diagnosis not present

## 2018-04-21 DIAGNOSIS — M25561 Pain in right knee: Secondary | ICD-10-CM | POA: Diagnosis not present

## 2018-04-22 ENCOUNTER — Ambulatory Visit (INDEPENDENT_AMBULATORY_CARE_PROVIDER_SITE_OTHER): Payer: BLUE CROSS/BLUE SHIELD | Admitting: Family Medicine

## 2018-04-22 VITALS — BP 111/80 | HR 100 | Temp 98.0°F | Ht 61.0 in | Wt 278.0 lb

## 2018-04-22 DIAGNOSIS — Z9189 Other specified personal risk factors, not elsewhere classified: Secondary | ICD-10-CM

## 2018-04-22 DIAGNOSIS — Z6841 Body Mass Index (BMI) 40.0 and over, adult: Secondary | ICD-10-CM

## 2018-04-22 DIAGNOSIS — F411 Generalized anxiety disorder: Secondary | ICD-10-CM | POA: Diagnosis not present

## 2018-04-22 DIAGNOSIS — E8881 Metabolic syndrome: Secondary | ICD-10-CM

## 2018-04-22 MED ORDER — FLUOXETINE HCL 10 MG PO CAPS: 10.0000 mg | ORAL_CAPSULE | Freq: Every day | ORAL | 0 refills | Status: DC

## 2018-04-23 DIAGNOSIS — M1712 Unilateral primary osteoarthritis, left knee: Secondary | ICD-10-CM | POA: Diagnosis not present

## 2018-04-23 NOTE — Progress Notes (Signed)
Office: [phone]  /  Fax: [phone]   HPI:   Chief Complaint: OBESITY Cynthia [name] is here to discuss her progress with her obesity treatment plan. She is on the keep a food journal with 1300-1400 calories and 85+ grams of protein daily and is following her eating plan approximately 70 % of the time. She states she is exercising 0 minutes 0 times per week. Cynthia [name] is very upset about the erroneous information in her chart concerning coronary artery bypass graft. She is eating but hasn't been on the plan.  Her weight is 278 lb (126.1 kg) today and has gained 1 pound since her last visit. She has lost 0 lbs since starting treatment with [place].  Insulin Resistance Cynthia [name] has a diagnosis of insulin resistance based on her elevated fasting insulin Cynthia [name] >5. Although Emmagene's blood glucose readings are still under good control, insulin resistance puts her at greater risk of metabolic syndrome and diabetes. She stopped metformin secondary to concern about diabetes by Orthopedist. Last Hgb A1c was of 5.5 and insulin of 12.9.  At risk for diabetes Cynthia [name] is at higher than average risk for developing diabetes due to her obesity and insulin resistance. She currently denies polyuria or polydipsia.  GAD Cynthia [name] has generalized anxiety disorder. She denies suicidal ideas or homicidal ideas. She notes panic attacks.  ALLERGIES: Allergies  Allergen Reactions  . Aquacel [Carboxymethylcellulose] Itching, Rash and Other (See Comments)    Caused blisters  . Penicillins     Unknown reaction Has patient had a PCN reaction causing immediate rash, facial/tongue/throat swelling, SOB or lightheadedness with hypotension: Unknown Has patient had a PCN reaction causing severe rash involving mucus membranes or skin necrosis: Unknown Has patient had a PCN reaction that required hospitalization: No Has patient had a PCN reaction occurring within the last 10 years: No If all of the above answers are "NO", then may proceed  with Cephalosporin use.   . Adhesive [Tape] Rash    Prefers to use paper tape  . Latex Rash  . Oxycodone Itching and Rash    MEDICATIONS: Current Outpatient Medications on File Prior to Visit  Medication Sig Dispense Refill  . acetaminophen (TYLENOL) 500 MG tablet Take 1,000 mg by mouth 3 (three) times daily as needed for moderate pain.     Marland Kitchen albuterol (PROAIR HFA) 108 (90 Base) MCG/ACT inhaler Inhale 2-4 puffs into the lungs every 6 (six) hours as needed for wheezing or shortness of breath.     Marland Kitchen alum hydroxide-mag trisilicate (GAVISCON) 80-20 MG CHEW chewable tablet Chew 1-2 tablets by mouth daily as needed for indigestion or heartburn.    Marland Kitchen aspirin EC 325 MG tablet Take 1 tablet (325 mg total) by mouth 2 (two) times daily. 60 tablet 3  . desloratadine (CLARINEX) 5 MG tablet Take 5 mg by mouth at bedtime.   0  . fluticasone furoate-vilanterol (BREO ELLIPTA) 200-25 MCG/INH AEPB Inhale 1 puff into the lungs daily.    Marland Kitchen HYDROcodone-acetaminophen (NORCO) 10-325 MG tablet Take 1 tablet by mouth every 6 (six) hours as needed.    . hyoscyamine (LEVSIN SL) 0.125 MG SL tablet Take 0.125 mg by mouth 3 (three) times daily with meals.   0  . methocarbamol (ROBAXIN) 500 MG tablet Take 500 mg by mouth 2 (two) times daily as needed for muscle spasms.     . montelukast (SINGULAIR) 10 MG tablet Take 10 mg by mouth at bedtime.    . norethindrone-ethinyl estradiol (MICROGESTIN FE 1/20) 1-20 MG-MCG tablet Take  1 tablet by mouth daily. (Patient taking differently: Take 1 tablet by mouth at bedtime. ) 3 Package 4  . olopatadine (PATANOL) 0.1 % ophthalmic solution Place 1 drop into both eyes daily as needed for allergies.     Cynthia [name] Place 1 spray into both nostrils daily as needed (allergies).   0  . traMADol (ULTRAM) 50 MG tablet Take by mouth every 6 (six) hours as needed.    . traZODone (DESYREL) 100 MG tablet Take 100 mg by mouth at bedtime.     . Vitamin D, Ergocalciferol, (DRISDOL) 50000  units CAPS capsule Take 1 capsule (50,000 Units total) by mouth every 7 (seven) days. 4 capsule 0   No current facility-administered medications on file prior to visit.     PAST MEDICAL HISTORY: Past Medical History:  Diagnosis Date  . ADHD (attention deficit hyperactivity disorder)   . Allergic rhinitis   . Anxiety   . Asthma   . Back injury    COMPETITIVE INJURY IN HIGH SCHOOL  . Back pain   . Depression   . Fatty liver   . Fibromyalgia   . Gallbladder problem   . GERD (gastroesophageal reflux disease)   . IBS (irritable bowel syndrome)   . Insulin resistance   . Joint pain   . Migraines   . OA (osteoarthritis) of knee   . Obesity   . Pneumonia   . PONV (postoperative nausea and vomiting)   . Seasonal allergies   . Vitamin D deficiency     PAST SURGICAL HISTORY: Past Surgical History:  Procedure Laterality Date  . CHOLECYSTECTOMY    . DG DILATION URETERS    . KNEE ARTHROSCOPY Left   . KNEE ARTHROSCOPY WITH LATERAL MENISECTOMY Right 04/11/2018   Procedure: RIGHT KNEE ARTHROSCOPY WITH PARTIAL MENISECTOMY CHONDROPLASTY;  Surgeon: Eugenia Mcalpine, MD;  Location: [facility];  Service: Orthopedics;  Laterality: Right;  . MOUTH SURGERY    . SHOULDER SURGERY  2002   RIGHT  . WISDOM TOOTH EXTRACTION    . WRIST ARTHROSCOPY     Right    SOCIAL HISTORY: Social History   Tobacco Use  . Smoking status: Never Smoker  . Smokeless tobacco: Never Used  Substance Use Topics  . Alcohol use: Yes    Comment: social  . Drug use: No    FAMILY HISTORY: Family History  Problem Relation Age of Onset  . Multiple sclerosis Mother   . Diabetes Mother   . Hyperlipidemia Mother   . Thyroid disease Mother   . Depression Mother   . Anxiety disorder Mother   . Obesity Mother   . Hypertension Father   . Diabetes Father   . Hyperlipidemia Father   . Obesity Father   . Diabetes Maternal Grandfather   . Cancer Maternal Grandfather        prostate  . Heart disease Maternal  Grandfather   . Heart disease Paternal Grandfather     ROS: Review of Systems  Constitutional: Negative for weight loss.  Genitourinary: Negative for frequency.  Endo/Heme/Allergies: Negative for polydipsia.  Psychiatric/Behavioral:       + Anxiety + Panic attacks    PHYSICAL EXAM: Blood pressure 111/80, pulse 100, temperature 98 F (36.7 C), temperature source Oral, height 5\' 1"  (1.549 m), weight 278 lb (126.1 kg), last menstrual period 04/02/2018, SpO2 99 %. Body mass index is 52.53 kg/m. Physical Exam  Constitutional: She is oriented to person, place, and time. She appears well-developed and well-nourished.  Cardiovascular: Normal rate.  Pulmonary/Chest: Effort normal.  Musculoskeletal: Normal range of motion.  Neurological: She is oriented to person, place, and time.  Skin: Skin is warm and dry.  Psychiatric: She has a normal mood and affect. Her behavior is normal.  Vitals reviewed.   RECENT LABS AND TESTS: BMET    Component Value Date/Time   NA 142 04/08/2018 1420   NA 139 05/01/2017 1009   K 4.6 04/08/2018 1420   CL 107 04/08/2018 1420   CO2 25 04/08/2018 1420   GLUCOSE 87 04/08/2018 1420   BUN 12 04/08/2018 1420   BUN 10 05/01/2017 1009   CREATININE 0.77 04/08/2018 1420   CALCIUM 9.1 04/08/2018 1420   GFRNONAA >60 04/08/2018 1420   GFRAA >60 04/08/2018 1420   Lab Results  Component Value Date   HGBA1C 5.5 01/16/2018   HGBA1C 5.4 10/03/2017   HGBA1C 5.3 05/01/2017   Lab Results  Component Value Date   INSULIN 12.9 01/16/2018   INSULIN 21.7 10/03/2017   INSULIN 11.1 05/01/2017   CBC    Component Value Date/Time   WBC 10.5 04/08/2018 1420   RBC 5.14 (H) 04/08/2018 1420   HGB 15.5 (H) 04/08/2018 1420   HGB 14.0 05/01/2017 1009   HCT 47.3 (H) 04/08/2018 1420   HCT 43.3 05/01/2017 1009   PLT 318 04/08/2018 1420   MCV 92.0 04/08/2018 1420   MCV 93 05/01/2017 1009   MCH 30.2 04/08/2018 1420   MCHC 32.8 04/08/2018 1420   RDW 13.7 04/08/2018 1420    RDW 13.7 05/01/2017 1009   LYMPHSABS 1.5 05/01/2017 1009   MONOABS 0.7 01/09/2012 0956   EOSABS 0.1 05/01/2017 1009   BASOSABS 0.0 05/01/2017 1009   Iron/TIBC/Ferritin/ %Sat No results found for: IRON, TIBC, FERRITIN, IRONPCTSAT Lipid Panel     Component Value Date/Time   CHOL 172 10/03/2017 0821   TRIG 73 10/03/2017 0821   HDL 53 10/03/2017 0821   CHOLHDL 3.4 01/09/2012 0956   VLDL 14 01/09/2012 0956   LDLCALC 104 (H) 10/03/2017 0821   Hepatic Function Panel     Component Value Date/Time   PROT 6.8 05/01/2017 1009   ALBUMIN 4.2 05/01/2017 1009   AST 15 05/01/2017 1009   ALT 14 05/01/2017 1009   ALKPHOS 61 05/01/2017 1009   BILITOT 0.3 05/01/2017 1009      Component Value Date/Time   TSH 1.640 05/01/2017 1009   TSH 1.850 01/09/2012 0956    ASSESSMENT AND PLAN: Insulin resistance  GAD (generalized anxiety disorder) - Plan: FLUoxetine (PROZAC) 10 MG capsule  At risk for diabetes mellitus  Class 3 severe obesity with serious comorbidity and body mass index (BMI) of 50.0 to 59.9 in adult, unspecified obesity type (HCC)  PLAN:  Insulin Resistance Cynthia [name] will continue to work on weight loss, exercise, and decreasing simple carbohydrates in her diet to help decrease the risk of diabetes. We dicussed metformin including benefits and risks. She was informed that eating too many simple carbohydrates or too many calories at one sitting increases the likelihood of GI side effects. Cynthia [name] declined metformin for now and prescription was not written today. We will repeat labs in December, and I will discuss with patient about starting medications at next appointment. Cynthia [name] agrees to follow up with our clinic in 2 weeks as directed to monitor her progress.  Diabetes risk counselling Cynthia [name] was given extended (15 minutes) diabetes prevention counseling today. She is 32 y.o. female and has risk factors for diabetes including obesity and insulin  resistance. We discussed intensive  lifestyle modifications today with an emphasis on weight loss as well as increasing exercise and decreasing simple carbohydrates in her diet.  GAD Cynthia [name] agrees to start Prozac 10 mg PO q AM #30 with no refills. Cynthia [name] agrees to follow up with our clinic in 2 weeks.  Obesity Cynthia [name] is currently in the action stage of change. As such, her goal is to continue with weight loss efforts She has agreed to portion control better and make smarter food choices, such as increase vegetables and decrease simple carbohydrates  Cynthia [name] has been instructed to work up to a goal of 150 minutes of combined cardio and strengthening exercise per week for weight loss and overall health benefits. We discussed the following Behavioral Modification Strategies today: increasing lean protein intake, increasing vegetables, work on meal planning and easy cooking plans, and keep a strict food journal Cynthia [name] can commit to small changes in terms of small changes.  Cynthia [name] has agreed to follow up with our clinic in 2 weeks. She was informed of the importance of frequent follow up visits to maximize her success with intensive lifestyle modifications for her multiple health conditions.   OBESITY BEHAVIORAL INTERVENTION VISIT  Today's visit was # 17   Starting weight: 264 lbs Starting date: 05/01/17 Today's weight : 278 lbs  Today's date: 04/22/2018 Total lbs lost to date: 0    ASK: We discussed the diagnosis of obesity with Cynthia [name] today and Cynthia [name] agreed to give [place] permission to discuss obesity behavioral modification therapy today.  ASSESS: Cynthia [name] has the diagnosis of obesity and her BMI today is 52.55 Cynthia [name] is in the action stage of change   ADVISE: Cynthia [name] was educated on the multiple health risks of obesity as well as the benefit of weight loss to improve her health. She was advised of the need for long term treatment and the importance of lifestyle modifications to improve her current health and to decrease her  risk of future health problems.  AGREE: Multiple dietary modification options and treatment options were discussed and  Larosa agreed to follow the recommendations documented in the above note.  ARRANGE: Desera was educated on the importance of frequent visits to treat obesity as outlined per CMS and USPSTF guidelines and agreed to schedule her next follow up appointment today.  I, Burt Knack, am acting as transcriptionist for Debbra Riding, MD  I have reviewed the above documentation for accuracy and completeness, and I agree with the above. - Debbra Riding, MD

## 2018-04-28 DIAGNOSIS — M25561 Pain in right knee: Secondary | ICD-10-CM | POA: Diagnosis not present

## 2018-04-30 DIAGNOSIS — M25561 Pain in right knee: Secondary | ICD-10-CM | POA: Diagnosis not present

## 2018-04-30 DIAGNOSIS — M1712 Unilateral primary osteoarthritis, left knee: Secondary | ICD-10-CM | POA: Diagnosis not present

## 2018-05-05 ENCOUNTER — Encounter: Payer: Self-pay | Admitting: Allergy and Immunology

## 2018-05-05 ENCOUNTER — Ambulatory Visit (INDEPENDENT_AMBULATORY_CARE_PROVIDER_SITE_OTHER): Payer: BLUE CROSS/BLUE SHIELD | Admitting: Allergy and Immunology

## 2018-05-05 VITALS — BP 110/80 | HR 100 | Temp 97.7°F | Ht 61.1 in | Wt 258.0 lb

## 2018-05-05 DIAGNOSIS — J4540 Moderate persistent asthma, uncomplicated: Secondary | ICD-10-CM | POA: Insufficient documentation

## 2018-05-05 DIAGNOSIS — H1013 Acute atopic conjunctivitis, bilateral: Secondary | ICD-10-CM

## 2018-05-05 DIAGNOSIS — J3089 Other allergic rhinitis: Secondary | ICD-10-CM

## 2018-05-05 DIAGNOSIS — J302 Other seasonal allergic rhinitis: Secondary | ICD-10-CM | POA: Insufficient documentation

## 2018-05-05 DIAGNOSIS — H1010 Acute atopic conjunctivitis, unspecified eye: Secondary | ICD-10-CM | POA: Insufficient documentation

## 2018-05-05 MED ORDER — FLUTICASONE FUROATE-VILANTEROL 200-25 MCG/INH IN AEPB: 1.0000 | INHALATION_SPRAY | Freq: Every day | RESPIRATORY_TRACT | 3 refills | Status: DC

## 2018-05-05 MED ORDER — LEVOCETIRIZINE DIHYDROCHLORIDE 5 MG PO TABS: 5.0000 mg | ORAL_TABLET | Freq: Every evening | ORAL | 5 refills | Status: DC

## 2018-05-05 MED ORDER — ALBUTEROL SULFATE HFA 108 (90 BASE) MCG/ACT IN AERS: 1.0000 | INHALATION_SPRAY | Freq: Four times a day (QID) | RESPIRATORY_TRACT | 1 refills | Status: DC | PRN

## 2018-05-05 MED ORDER — QNASL 80 MCG/ACT NA AERS: 1.0000 | INHALATION_SPRAY | Freq: Two times a day (BID) | NASAL | 3 refills | Status: DC | PRN

## 2018-05-05 MED ORDER — OLOPATADINE HCL 0.7 % OP SOLN: 1.0000 [drp] | OPHTHALMIC | 5 refills | Status: DC

## 2018-05-05 MED ORDER — MONTELUKAST SODIUM 10 MG PO TABS: 10.0000 mg | ORAL_TABLET | Freq: Every day | ORAL | 5 refills | Status: DC

## 2018-05-05 NOTE — Progress Notes (Signed)
New Patient Note  RE: Cynthia [name] MRN: [id number] DOB: [birth year] Date of Office Visit: 05/05/2018  Referring provider: Jarrett Soho, PA-C Primary care provider: Clayborn Heron, MD  Chief Complaint: Nasal Congestion and Asthma   History of present illness: Cynthia [name] is a 32 y.o. female seen today in consultation requested by Jarrett Soho, PA-C.  She reports that she was diagnosed with asthma in 2012 after a "severe bout of pneumonia."  She has used Brio Ellipta 200-25 g "on and off" over the past 2 years.  She reports that she had an asthma exacerbation in late July and has taken Breo 200 daily since that time along with montelukast 10 mg daily at bedtime.  While on these 2 medications her asthma has been well controlled and she rarely requires albuterol rescue. Chudney experiences nasal congestion, rhinorrhea, sneezing, postnasal drainage, nasal pruritus, ocular pruritus, sinus pressure, and occasional ear pressure.  She states that she has been on multiple prescription nasal sprays.  She did not experience symptom reduction from any of these nasal sprays with the exception of Qnasl, however her insurance no longer covers Qnasl.  Assessment and plan: Seasonal and perennial allergic rhinitis  Aeroallergen avoidance measures have been discussed and provided in written form.  For now, continue montelukast 10 mg daily.  A prescription has been provided for levocetirizine, 5 mg daily as needed.  A refill prescription and prior authorization has been provided for Qnasl, 1-2 actuations twice daily as needed.  Nasal saline spray (i.e., Simply Saline) or nasal saline lavage (i.e., NeilMed) is recommended as needed and prior to medicated nasal sprays.  The risks and benefits of aeroallergen immunotherapy have been discussed. The patient is interested in the possibility of initiating immunotherapy if insurance coverage is favorable. She will let us know how she would like  to proceed.  Allergic conjunctivitis  Treatment plan as outlined above for allergic rhinitis.  A prescription has been provided for Pazeo, one drop per eye daily as needed.  I have also recommended eye lubricant drops (i.e., Natural Tears) as needed.  Moderate persistent asthma  For now, continue Brio Ellipta 200-25 g, 1 inhalation daily, montelukast 10 mg daily at bedtime, and albuterol HFA, 1-2 inhalations every 4-6 hours as needed.  Subjective and objective measures of pulmonary function will be followed and the treatment plan will be adjusted accordingly.   Meds ordered this encounter  Medications  . levocetirizine (XYZAL) 5 MG tablet    Sig: Take 1 tablet (5 mg total) by mouth every evening.    Dispense:  30 tablet    Refill:  5  . QNASL 80 MCG/ACT AERS    Sig: Place 1 spray into both nostrils 2 (two) times daily as needed (allergies).    Dispense:  10.6 g    Refill:  3  . Olopatadine HCl (PAZEO) 0.7 % SOLN    Sig: Place 1 drop into both eyes 1 day or 1 dose.    Dispense:  1 Bottle    Refill:  5  . montelukast (SINGULAIR) 10 MG tablet    Sig: Take 1 tablet (10 mg total) by mouth at bedtime.    Dispense:  30 tablet    Refill:  5  . albuterol (PROAIR HFA) 108 (90 Base) MCG/ACT inhaler    Sig: Inhale 1-2 puffs into the lungs every 6 (six) hours as needed for wheezing or shortness of breath.    Dispense:  18 g    Refill:  1  .  fluticasone furoate-vilanterol (BREO ELLIPTA) 200-25 MCG/INH AEPB    Sig: Inhale 1 puff into the lungs daily.    Dispense:  60 each    Refill:  3    Diagnostics: Spirometry: Spirometry reveals an FVC of 3.25 L and an FEV1 of 2.71 L (94% predicted) without postbronchodilator improvement.  This study was performed while the patient was asymptomatic.  Please see scanned spirometry results for details. Epicutaneous testing: Positive to grass pollen, weed pollen, and tree pollen. Intradermal testing: Positive to ragweed pollen and molds.     Physical examination: Blood pressure 110/80, pulse 100, temperature 97.7 F (36.5 C), temperature source Oral, height 5' 1.1" (1.552 m), weight 258 lb (117 kg), SpO2 97 %.  General: Alert, interactive, in no acute distress. HEENT: TMs pearly gray, turbinates moderately edematous with thick discharge, post-pharynx erythematous. Neck: Supple without lymphadenopathy. Lungs: Clear to auscultation without wheezing, rhonchi or rales. CV: Normal S1, S2 without murmurs. Abdomen: Nondistended, nontender. Skin: Warm and dry, without lesions or rashes. Extremities:  No clubbing, cyanosis or edema. Neuro:   Grossly intact.  Review of systems:  Review of systems negative except as noted in HPI / PMHx or noted below: Review of Systems  Constitutional: Negative.   HENT: Negative.   Eyes: Negative.   Respiratory: Negative.   Cardiovascular: Negative.   Gastrointestinal: Negative.   Genitourinary: Negative.   Musculoskeletal: Negative.   Skin: Negative.   Neurological: Negative.   Endo/Heme/Allergies: Negative.   Psychiatric/Behavioral: Negative.     Past medical history:  Past Medical History:  Diagnosis Date  . ADHD (attention deficit hyperactivity disorder)   . Allergic rhinitis   . Anxiety   . Asthma   . Back injury    COMPETITIVE INJURY IN HIGH SCHOOL  . Back pain   . Depression   . Fatty liver   . Fibromyalgia   . Gallbladder problem   . GERD (gastroesophageal reflux disease)   . IBS (irritable bowel syndrome)   . Insulin resistance   . Joint pain   . Migraines   . OA (osteoarthritis) of knee   . Obesity   . Pneumonia   . PONV (postoperative nausea and vomiting)   . Seasonal allergies   . Vitamin D deficiency     Past surgical history:  Past Surgical History:  Procedure Laterality Date  . CHOLECYSTECTOMY    . DG DILATION URETERS    . KNEE ARTHROSCOPY Left   . KNEE ARTHROSCOPY WITH LATERAL MENISECTOMY Right 04/11/2018   Procedure: RIGHT KNEE ARTHROSCOPY WITH  PARTIAL MENISECTOMY CHONDROPLASTY;  Surgeon: Eugenia Mcalpine, MD;  Location: [facility];  Service: Orthopedics;  Laterality: Right;  . MOUTH SURGERY    . SHOULDER SURGERY  2002   RIGHT  . WISDOM TOOTH EXTRACTION    . WRIST ARTHROSCOPY     Right    Family history: Family History  Problem Relation Age of Onset  . Multiple sclerosis Mother   . Diabetes Mother   . Hyperlipidemia Mother   . Thyroid disease Mother   . Depression Mother   . Anxiety disorder Mother   . Obesity Mother   . Allergic rhinitis Mother   . Hypertension Father   . Diabetes Father   . Hyperlipidemia Father   . Obesity Father   . Allergic rhinitis Father   . Diabetes Maternal Grandfather   . Cancer Maternal Grandfather        prostate  . Heart disease Maternal Grandfather   . Heart disease Paternal Grandfather   .  Allergic rhinitis Paternal Aunt   . Allergic rhinitis Maternal Grandmother   . Asthma Neg Hx   . Eczema Neg Hx   . Urticaria Neg Hx     Social history: Social History   Socioeconomic History  . Marital status: Single    Spouse name: Not on file  . Number of children: Not on file  . Years of education: Not on file  . Highest education level: Not on file  Occupational History  . Occupation: Asst. Social research officer, government, Human resources officer  Social Needs  . Financial resource strain: Not on file  . Food insecurity:    Worry: Not on file    Inability: Not on file  . Transportation needs:    Medical: Not on file    Non-medical: Not on file  Tobacco Use  . Smoking status: Never Smoker  . Smokeless tobacco: Never Used  Substance and Sexual Activity  . Alcohol use: Yes    Comment: social  . Drug use: No  . Sexual activity: Not Currently    Birth control/protection: Pill  Lifestyle  . Physical activity:    Days per week: Not on file    Minutes per session: Not on file  . Stress: Not on file  Relationships  . Social connections:    Talks on phone: Not on file    Gets together: Not on file     Attends religious service: Not on file    Active member of club or organization: Not on file    Attends meetings of clubs or organizations: Not on file    Relationship status: Not on file  . Intimate partner violence:    Fear of current or ex partner: Not on file    Emotionally abused: Not on file    Physically abused: Not on file    Forced sexual activity: Not on file  Other Topics Concern  . Not on file  Social History Narrative  . Not on file   Environmental History: The patient lives in a 32 year old house with carpeting the bedroom, gas heat, and central air.  There is no known mold/water damage in the home.  Allergies as of 05/05/2018      Reactions   Aquacel [carboxymethylcellulose] Itching, Rash, Other (See Comments)   Caused blisters   Penicillins    Unknown reaction Has patient had a PCN reaction causing immediate rash, facial/tongue/throat swelling, SOB or lightheadedness with hypotension: Unknown Has patient had a PCN reaction causing severe rash involving mucus membranes or skin necrosis: Unknown Has patient had a PCN reaction that required hospitalization: No Has patient had a PCN reaction occurring within the last 10 years: No If all of the above answers are "NO", then may proceed with Cephalosporin use.   Adhesive [tape] Rash   Prefers to use paper tape   Latex Rash   Oxycodone Itching, Rash      Medication List        Accurate as of 05/05/18  7:03 PM. Always use your most recent med list.          acetaminophen 500 MG tablet Commonly known as:  TYLENOL Take 1,000 mg by mouth 3 (three) times daily as needed for moderate pain.   albuterol 108 (90 Base) MCG/ACT inhaler Commonly known as:  PROVENTIL HFA;VENTOLIN HFA Inhale 1-2 puffs into the lungs every 6 (six) hours as needed for wheezing or shortness of breath.   alum hydroxide-mag trisilicate 80-20 MG Chew chewable tablet Commonly known as:  GAVISCON Chew  1-2 tablets by mouth daily as needed for  indigestion or heartburn.   aspirin EC 325 MG tablet Take 1 tablet (325 mg total) by mouth 2 (two) times daily.   desloratadine 5 MG tablet Commonly known as:  CLARINEX Take 5 mg by mouth at bedtime.   FLUoxetine 10 MG capsule Commonly known as:  PROZAC Take 1 capsule (10 mg total) by mouth daily.   fluticasone furoate-vilanterol 200-25 MCG/INH Aepb Commonly known as:  BREO ELLIPTA Inhale 1 puff into the lungs daily.   HYDROcodone-acetaminophen 10-325 MG tablet Commonly known as:  NORCO Take 1 tablet by mouth every 6 (six) hours as needed.   hyoscyamine 0.125 MG SL tablet Commonly known as:  LEVSIN SL Take 0.125 mg by mouth 3 (three) times daily with meals.   levocetirizine 5 MG tablet Commonly known as:  XYZAL Take 1 tablet (5 mg total) by mouth every evening.   methocarbamol 500 MG tablet Commonly known as:  ROBAXIN Take 500 mg by mouth 2 (two) times daily as needed for muscle spasms.   montelukast 10 MG tablet Commonly known as:  SINGULAIR Take 1 tablet (10 mg total) by mouth at bedtime.   norethindrone-ethinyl estradiol 1-20 MG-MCG tablet Commonly known as:  JUNEL FE,GILDESS FE,LOESTRIN FE Take 1 tablet by mouth daily.   Olopatadine HCl 0.7 % Soln Place 1 drop into both eyes 1 day or 1 dose.   olopatadine 0.1 % ophthalmic solution Commonly known as:  PATANOL Place 1 drop into both eyes daily as needed for allergies.   QNASL 80 MCG/ACT Aers Generic drug:  Beclomethasone Dipropionate Place 1 spray into both nostrils 2 (two) times daily as needed (allergies).   traMADol 50 MG tablet Commonly known as:  ULTRAM Take by mouth every 6 (six) hours as needed.   traZODone 100 MG tablet Commonly known as:  DESYREL Take 100 mg by mouth at bedtime.   Vitamin D (Ergocalciferol) 50000 units Caps capsule Commonly known as:  DRISDOL Take 1 capsule (50,000 Units total) by mouth every 7 (seven) days.       Known medication allergies: Allergies  Allergen Reactions   . Aquacel [Carboxymethylcellulose] Itching, Rash and Other (See Comments)    Caused blisters  . Penicillins     Unknown reaction Has patient had a PCN reaction causing immediate rash, facial/tongue/throat swelling, SOB or lightheadedness with hypotension: Unknown Has patient had a PCN reaction causing severe rash involving mucus membranes or skin necrosis: Unknown Has patient had a PCN reaction that required hospitalization: No Has patient had a PCN reaction occurring within the last 10 years: No If all of the above answers are "NO", then may proceed with Cephalosporin use.   . Adhesive [Tape] Rash    Prefers to use paper tape  . Latex Rash  . Oxycodone Itching and Rash    I appreciate the opportunity to take part in Dejanira's care. Please do not hesitate to contact me with questions.  Sincerely,   R. Jorene Guest, MD

## 2018-05-05 NOTE — Assessment & Plan Note (Signed)
   Treatment plan as outlined above for allergic rhinitis.  A prescription has been provided for Pazeo, one drop per eye daily as needed.  I have also recommended eye lubricant drops (i.e., Natural Tears) as needed. 

## 2018-05-05 NOTE — Assessment & Plan Note (Addendum)
   Aeroallergen avoidance measures have been discussed and provided in written form.  For now, continue montelukast 10 mg daily.  A prescription has been provided for levocetirizine, 5 mg daily as needed.  A refill prescription and prior authorization has been provided for Qnasl, 1-2 actuations twice daily as needed.  Nasal saline spray (i.e., Simply Saline) or nasal saline lavage (i.e., NeilMed) is recommended as needed and prior to medicated nasal sprays.  The risks and benefits of aeroallergen immunotherapy have been discussed. The patient is interested in the possibility of initiating immunotherapy if insurance coverage is favorable. She will let us know how she would like to proceed.

## 2018-05-05 NOTE — Assessment & Plan Note (Signed)
   For now, continue Brio Ellipta 200-25 g, 1 inhalation daily, montelukast 10 mg daily at bedtime, and albuterol HFA, 1-2 inhalations every 4-6 hours as needed.  Subjective and objective measures of pulmonary function will be followed and the treatment plan will be adjusted accordingly. 

## 2018-05-05 NOTE — Patient Instructions (Addendum)
Seasonal and perennial allergic rhinitis  Aeroallergen avoidance measures have been discussed and provided in written form.  For now, continue montelukast 10 mg daily.  A prescription has been provided for levocetirizine, 5 mg daily as needed.  A refill prescription and prior authorization has been provided for Qnasl, 1-2 actuations twice daily as needed.  Nasal saline spray (i.e., Simply Saline) or nasal saline lavage (i.e., NeilMed) is recommended as needed and prior to medicated nasal sprays.  The risks and benefits of aeroallergen immunotherapy have been discussed. The patient is interested in the possibility of initiating immunotherapy if insurance coverage is favorable. She will let us know how she would like to proceed.  Allergic conjunctivitis  Treatment plan as outlined above for allergic rhinitis.  A prescription has been provided for Pazeo, one drop per eye daily as needed.  I have also recommended eye lubricant drops (i.e., Natural Tears) as needed.  Moderate persistent asthma  For now, continue Brio Ellipta 200-25 g, 1 inhalation daily, montelukast 10 mg daily at bedtime, and albuterol HFA, 1-2 inhalations every 4-6 hours as needed.  Subjective and objective measures of pulmonary function will be followed and the treatment plan will be adjusted accordingly.   Return in about 4 months (around 09/05/2018), or if symptoms worsen or fail to improve.  Reducing Pollen Exposure  The American Academy of Allergy, Asthma and Immunology suggests the following steps to reduce your exposure to pollen during allergy seasons.    1. Do not hang sheets or clothing out to dry; pollen may collect on these items. 2. Do not mow lawns or spend time around freshly cut grass; mowing stirs up pollen. 3. Keep windows closed at night.  Keep car windows closed while driving. 4. Minimize morning activities outdoors, a time when pollen counts are usually at their highest. 5. Stay indoors as  much as possible when pollen counts or humidity is high and on windy days when pollen tends to remain in the air longer. 6. Use air conditioning when possible.  Many air conditioners have filters that trap the pollen spores. 7. Use a HEPA room air filter to remove pollen form the indoor air you breathe.   Control of Mold Allergen  Mold and fungi can grow on a variety of surfaces provided certain temperature and moisture conditions exist.  Outdoor molds grow on plants, decaying vegetation and soil.  The major outdoor mold, Alternaria and Cladosporium, are found in very high numbers during hot and dry conditions.  Generally, a late Summer - Fall peak is seen for common outdoor fungal spores.  Rain will temporarily lower outdoor mold spore count, but counts rise rapidly when the rainy period ends.  The most important indoor molds are Aspergillus and Penicillium.  Dark, humid and poorly ventilated basements are ideal sites for mold growth.  The next most common sites of mold growth are the bathroom and the kitchen.  Outdoor [employer] 1. Use air conditioning and keep windows closed 2. Avoid exposure to decaying vegetation. 3. Avoid leaf raking. 4. Avoid grain handling. 5. Consider wearing a face mask if working in moldy areas.  Indoor Mold Control 1. Maintain humidity below 50%. 2. Clean washable surfaces with 5% bleach solution. 3. Remove sources e.g. Contaminated carpets.

## 2018-05-06 ENCOUNTER — Ambulatory Visit (INDEPENDENT_AMBULATORY_CARE_PROVIDER_SITE_OTHER): Payer: BLUE CROSS/BLUE SHIELD | Admitting: Family Medicine

## 2018-05-06 ENCOUNTER — Ambulatory Visit: Payer: 59 | Admitting: Allergy and Immunology

## 2018-05-06 ENCOUNTER — Telehealth: Payer: Self-pay

## 2018-05-06 VITALS — BP 144/86 | HR 109 | Temp 98.2°F | Ht 61.0 in | Wt 280.0 lb

## 2018-05-06 DIAGNOSIS — Z9189 Other specified personal risk factors, not elsewhere classified: Secondary | ICD-10-CM | POA: Diagnosis not present

## 2018-05-06 DIAGNOSIS — R000 Tachycardia, unspecified: Secondary | ICD-10-CM

## 2018-05-06 DIAGNOSIS — F411 Generalized anxiety disorder: Secondary | ICD-10-CM | POA: Diagnosis not present

## 2018-05-06 DIAGNOSIS — E559 Vitamin D deficiency, unspecified: Secondary | ICD-10-CM | POA: Diagnosis not present

## 2018-05-06 DIAGNOSIS — Z6841 Body Mass Index (BMI) 40.0 and over, adult: Secondary | ICD-10-CM

## 2018-05-06 DIAGNOSIS — M25561 Pain in right knee: Secondary | ICD-10-CM | POA: Diagnosis not present

## 2018-05-06 MED ORDER — VITAMIN D (ERGOCALCIFEROL) 1.25 MG (50000 UNIT) PO CAPS: 50000.0000 [IU] | ORAL_CAPSULE | ORAL | 0 refills | Status: DC

## 2018-05-06 NOTE — Telephone Encounter (Signed)
Pa requested for patient's Qnasl. I have submitted this via covermymeds. Will await coverage determination.

## 2018-05-07 DIAGNOSIS — M1712 Unilateral primary osteoarthritis, left knee: Secondary | ICD-10-CM | POA: Diagnosis not present

## 2018-05-07 NOTE — Telephone Encounter (Signed)
Prior authorization has been approved and sent to pharmacy.  

## 2018-05-08 NOTE — Progress Notes (Signed)
Office: [phone]  /  Fax: [phone]   HPI:   Chief Complaint: OBESITY Cynthia [name] is here to discuss her progress with her obesity treatment plan. She is on the portion control better and make smarter food choices and is following her eating plan approximately 70 % of the time. She states she is exercising 0 minutes 0 times per week. Cynthia [name] has noticed a slight improvement in her mood. She is struggling to eat 2 meals a day secondary to feeling a pit in her stomach.  Her weight is 280 lb (127 kg) today and has had a weight gain of 2 pounds in 2 weeks since her last visit. She has lost 0 lbs since starting treatment with [place].  Vitamin D deficiency Cynthia [name] has a diagnosis of vitamin D deficiency. She is currently taking vit D. She admits fatigue and denies nausea, vomiting or muscle weakness.  At risk for osteopenia and osteoporosis Cynthia [name] is at higher risk of osteopenia and osteoporosis due to vitamin D deficiency.   Tachycardia Cynthia [name]'s heart rate is increased with activity secondary to pain and anxiety.  ALLERGIES: Allergies  Allergen Reactions  . Aquacel [Carboxymethylcellulose] Itching, Rash and Other (See Comments)    Caused blisters  . Penicillins     Unknown reaction Has patient had a PCN reaction causing immediate rash, facial/tongue/throat swelling, SOB or lightheadedness with hypotension: Unknown Has patient had a PCN reaction causing severe rash involving mucus membranes or skin necrosis: Unknown Has patient had a PCN reaction that required hospitalization: No Has patient had a PCN reaction occurring within the last 10 years: No If all of the above answers are "NO", then may proceed with Cephalosporin use.   . Adhesive [Tape] Rash    Prefers to use paper tape  . Latex Rash  . Oxycodone Itching and Rash    MEDICATIONS: Current Outpatient Medications on File Prior to Visit  Medication Sig Dispense Refill  . acetaminophen (TYLENOL) 500 MG tablet Take 1,000 mg by mouth 3  (three) times daily as needed for moderate pain.     Marland Kitchen albuterol (PROAIR HFA) 108 (90 Base) MCG/ACT inhaler Inhale 1-2 puffs into the lungs every 6 (six) hours as needed for wheezing or shortness of breath. 18 g 1  . alum hydroxide-mag trisilicate (GAVISCON) 80-20 MG CHEW chewable tablet Chew 1-2 tablets by mouth daily as needed for indigestion or heartburn.    Marland Kitchen aspirin EC 325 MG tablet Take 1 tablet (325 mg total) by mouth 2 (two) times daily. 60 tablet 3  . desloratadine (CLARINEX) 5 MG tablet Take 5 mg by mouth at bedtime.   0  . FLUoxetine (PROZAC) 10 MG capsule Take 1 capsule (10 mg total) by mouth daily. 30 capsule 0  . fluticasone furoate-vilanterol (BREO ELLIPTA) 200-25 MCG/INH AEPB Inhale 1 puff into the lungs daily. 60 each 3  . HYDROcodone-acetaminophen (NORCO) 10-325 MG tablet Take 1 tablet by mouth every 6 (six) hours as needed.    . hyoscyamine (LEVSIN SL) 0.125 MG SL tablet Take 0.125 mg by mouth 3 (three) times daily with meals.   0  . levocetirizine (XYZAL) 5 MG tablet Take 1 tablet (5 mg total) by mouth every evening. 30 tablet 5  . methocarbamol (ROBAXIN) 500 MG tablet Take 500 mg by mouth 2 (two) times daily as needed for muscle spasms.     . montelukast (SINGULAIR) 10 MG tablet Take 1 tablet (10 mg total) by mouth at bedtime. 30 tablet 5  . norethindrone-ethinyl estradiol (MICROGESTIN FE  1/20) 1-20 MG-MCG tablet Take 1 tablet by mouth daily. (Patient taking differently: Take 1 tablet by mouth at bedtime. ) 3 Package 4  . olopatadine (PATANOL) 0.1 % ophthalmic solution Place 1 drop into both eyes daily as needed for allergies.     . Olopatadine HCl (PAZEO) 0.7 % SOLN Place 1 drop into both eyes 1 day or 1 dose. 1 Bottle 5  . QNASL 80 MCG/ACT AERS Place 1 spray into both nostrils 2 (two) times daily as needed (allergies). 10.6 g 3  . traMADol (ULTRAM) 50 MG tablet Take by mouth every 6 (six) hours as needed.    . traZODone (DESYREL) 100 MG tablet Take 100 mg by mouth at bedtime.       No current facility-administered medications on file prior to visit.     PAST MEDICAL HISTORY: Past Medical History:  Diagnosis Date  . ADHD (attention deficit hyperactivity disorder)   . Allergic rhinitis   . Anxiety   . Asthma   . Back injury    COMPETITIVE INJURY IN HIGH SCHOOL  . Back pain   . Depression   . Fatty liver   . Fibromyalgia   . Gallbladder problem   . GERD (gastroesophageal reflux disease)   . IBS (irritable bowel syndrome)   . Insulin resistance   . Joint pain   . Migraines   . OA (osteoarthritis) of knee   . Obesity   . Pneumonia   . PONV (postoperative nausea and vomiting)   . Seasonal allergies   . Vitamin D deficiency     PAST SURGICAL HISTORY: Past Surgical History:  Procedure Laterality Date  . CHOLECYSTECTOMY    . DG DILATION URETERS    . KNEE ARTHROSCOPY Left   . KNEE ARTHROSCOPY WITH LATERAL MENISECTOMY Right 04/11/2018   Procedure: RIGHT KNEE ARTHROSCOPY WITH PARTIAL MENISECTOMY CHONDROPLASTY;  Surgeon: Eugenia Mcalpine, MD;  Location: [facility];  Service: Orthopedics;  Laterality: Right;  . MOUTH SURGERY    . SHOULDER SURGERY  2002   RIGHT  . WISDOM TOOTH EXTRACTION    . WRIST ARTHROSCOPY     Right    SOCIAL HISTORY: Social History   Tobacco Use  . Smoking status: Never Smoker  . Smokeless tobacco: Never Used  Substance Use Topics  . Alcohol use: Yes    Comment: social  . Drug use: No    FAMILY HISTORY: Family History  Problem Relation Age of Onset  . Multiple sclerosis Mother   . Diabetes Mother   . Hyperlipidemia Mother   . Thyroid disease Mother   . Depression Mother   . Anxiety disorder Mother   . Obesity Mother   . Allergic rhinitis Mother   . Hypertension Father   . Diabetes Father   . Hyperlipidemia Father   . Obesity Father   . Allergic rhinitis Father   . Diabetes Maternal Grandfather   . Cancer Maternal Grandfather        prostate  . Heart disease Maternal Grandfather   . Heart disease Paternal  Grandfather   . Allergic rhinitis Paternal Aunt   . Allergic rhinitis Maternal Grandmother   . Asthma Neg Hx   . Eczema Neg Hx   . Urticaria Neg Hx     ROS: Review of Systems  Constitutional: Negative for weight loss.  Gastrointestinal: Negative for nausea and vomiting.  Musculoskeletal:       Negative for muscle weakness.  Psychiatric/Behavioral: The patient is nervous/anxious.     PHYSICAL EXAM: Blood  pressure (!) 144/86, pulse (!) 109, temperature 98.2 F (36.8 C), temperature source Oral, height 5\' 1"  (1.549 m), weight 280 lb (127 kg), SpO2 97 %. Body mass index is 52.91 kg/m. Physical Exam  Constitutional: She is oriented to person, place, and time. She appears well-developed and well-nourished.  Cardiovascular: Normal rate.  Pulmonary/Chest: Effort normal.  Musculoskeletal: Normal range of motion.  Neurological: She is oriented to person, place, and time.  Skin: Skin is warm and dry.  Psychiatric: She has a normal mood and affect. Her behavior is normal.  Vitals reviewed.   RECENT LABS AND TESTS: BMET    Component Value Date/Time   NA 142 04/08/2018 1420   NA 139 05/01/2017 1009   K 4.6 04/08/2018 1420   CL 107 04/08/2018 1420   CO2 25 04/08/2018 1420   GLUCOSE 87 04/08/2018 1420   BUN 12 04/08/2018 1420   BUN 10 05/01/2017 1009   CREATININE 0.77 04/08/2018 1420   CALCIUM 9.1 04/08/2018 1420   GFRNONAA >60 04/08/2018 1420   GFRAA >60 04/08/2018 1420   Lab Results  Component Value Date   HGBA1C 5.5 01/16/2018   HGBA1C 5.4 10/03/2017   HGBA1C 5.3 05/01/2017   Lab Results  Component Value Date   INSULIN 12.9 01/16/2018   INSULIN 21.7 10/03/2017   INSULIN 11.1 05/01/2017   CBC    Component Value Date/Time   WBC 10.5 04/08/2018 1420   RBC 5.14 (H) 04/08/2018 1420   HGB 15.5 (H) 04/08/2018 1420   HGB 14.0 05/01/2017 1009   HCT 47.3 (H) 04/08/2018 1420   HCT 43.3 05/01/2017 1009   PLT 318 04/08/2018 1420   MCV 92.0 04/08/2018 1420   MCV 93  05/01/2017 1009   MCH 30.2 04/08/2018 1420   MCHC 32.8 04/08/2018 1420   RDW 13.7 04/08/2018 1420   RDW 13.7 05/01/2017 1009   LYMPHSABS 1.5 05/01/2017 1009   MONOABS 0.7 01/09/2012 0956   EOSABS 0.1 05/01/2017 1009   BASOSABS 0.0 05/01/2017 1009   Iron/TIBC/Ferritin/ %Sat No results found for: IRON, TIBC, FERRITIN, IRONPCTSAT Lipid Panel     Component Value Date/Time   CHOL 172 10/03/2017 0821   TRIG 73 10/03/2017 0821   HDL 53 10/03/2017 0821   CHOLHDL 3.4 01/09/2012 0956   VLDL 14 01/09/2012 0956   LDLCALC 104 (H) 10/03/2017 0821   Hepatic Function Panel     Component Value Date/Time   PROT 6.8 05/01/2017 1009   ALBUMIN 4.2 05/01/2017 1009   AST 15 05/01/2017 1009   ALT 14 05/01/2017 1009   ALKPHOS 61 05/01/2017 1009   BILITOT 0.3 05/01/2017 1009      Component Value Date/Time   TSH 1.640 05/01/2017 1009   TSH 1.850 01/09/2012 0956   Results for ARIELLA, VOIT (MRN [id number]) as of 05/08/2018 08:05  Ref. Range 01/16/2018 13:47  Vitamin D, 25-Hydroxy Latest Ref Range: 30.0 - 100.0 ng/mL 33.8   ASSESSMENT AND PLAN: Vitamin D deficiency - Plan: Vitamin D, Ergocalciferol, (DRISDOL) 50000 units CAPS capsule  GAD (generalized anxiety disorder)  Tachycardia  At risk for osteoporosis  Class 3 severe obesity with serious comorbidity and body mass index (BMI) of 50.0 to 59.9 in adult, unspecified obesity type (HCC)  PLAN:  Vitamin D Deficiency Cynthia [name] was informed that low vitamin D levels contributes to fatigue and are associated with obesity, breast, and colon cancer. She agrees to continue to take prescription Vit D @50 ,000 IU every week #4 with no refills and will follow up for  routine testing of vitamin D, at least 2-3 times per year. She was informed of the risk of over-replacement of vitamin D and agrees to not increase her dose unless she discusses this with [place] first. Cynthia [name] agrees to follow up in 2 weeks.  At risk for osteopenia and osteoporosis Cynthia [name] was  given extended (15 minutes) osteoporosis prevention counseling today. Cynthia [name] is at risk for osteopenia and osteoporosis due to her vitamin D deficiency. She was encouraged to take her vitamin D and follow her higher calcium diet and increase strengthening exercise to help strengthen her bones and decrease her risk of osteopenia and osteoporosis.  Tachycardia Cynthia [name] agrees to follow up at next appointment in 2 weeks.  Obesity Cynthia [name] is currently in the action stage of change. As such, her goal is to continue with weight loss efforts. She has agreed to portion control better and make smarter food choices or pescatarian plan. Cynthia [name] has been instructed to work up to a goal of 150 minutes of combined cardio and strengthening exercise per week for weight loss and overall health benefits. We discussed the following Behavioral Modification Strategies today: increasing lean protein intake, increasing vegetables, work on meal planning and easy cooking plans, better snacking choices, and planning for success.  Cynthia [name] has agreed to follow up with our clinic in 2 weeks. She was informed of the importance of frequent follow up visits to maximize her success with intensive lifestyle modifications for her multiple health conditions.   OBESITY BEHAVIORAL INTERVENTION VISIT  Today's visit was # 18   Starting weight: 280 lb Starting date: 05/01/17 Today's weight : Weight: 280 lb (127 kg)  Today's date: 05/06/2018 Total lbs lost to date: 0  ASK: We discussed the diagnosis of obesity with Cynthia [name] today and Zaryiah agreed to give [place] permission to discuss obesity behavioral modification therapy today.  ASSESS: Cynthia [name] has the diagnosis of obesity and her BMI today is 52.93.  Cynthia [name] is in the action stage of change.   ADVISE: Cynthia [name] was educated on the multiple health risks of obesity as well as the benefit of weight loss to improve her health. She was advised of the need for long term treatment and the  importance of lifestyle modifications to improve her current health and to decrease her risk of future health problems.  AGREE: Multiple dietary modification options and treatment options were discussed and Cynthia [name] agreed to follow the recommendations documented in the above note.  ARRANGE: Cynthia [name] was educated on the importance of frequent visits to treat obesity as outlined per CMS and USPSTF guidelines and agreed to schedule her next follow up appointment today.  I, Kirke Corin, am acting as Energy manager for Filbert Schilder, MD  I have reviewed the above documentation for accuracy and completeness, and I agree with the above. - Debbra Riding, MD

## 2018-05-09 DIAGNOSIS — M25561 Pain in right knee: Secondary | ICD-10-CM | POA: Diagnosis not present

## 2018-05-12 DIAGNOSIS — M25561 Pain in right knee: Secondary | ICD-10-CM | POA: Diagnosis not present

## 2018-05-15 DIAGNOSIS — M25561 Pain in right knee: Secondary | ICD-10-CM | POA: Diagnosis not present

## 2018-05-20 DIAGNOSIS — M25561 Pain in right knee: Secondary | ICD-10-CM | POA: Diagnosis not present

## 2018-05-21 ENCOUNTER — Other Ambulatory Visit (HOSPITAL_COMMUNITY): Payer: Self-pay | Admitting: Specialist

## 2018-05-21 ENCOUNTER — Ambulatory Visit (HOSPITAL_COMMUNITY)
Admission: RE | Admit: 2018-05-21 | Discharge: 2018-05-21 | Disposition: A | Discharge status: Home / Self Care | Payer: BLUE CROSS/BLUE SHIELD | Source: Ambulatory Visit | Attending: Cardiology | Admitting: Cardiology

## 2018-05-21 DIAGNOSIS — M25562 Pain in left knee: Secondary | ICD-10-CM | POA: Diagnosis not present

## 2018-05-21 DIAGNOSIS — M79662 Pain in left lower leg: Secondary | ICD-10-CM

## 2018-05-22 ENCOUNTER — Ambulatory Visit (INDEPENDENT_AMBULATORY_CARE_PROVIDER_SITE_OTHER): Payer: BLUE CROSS/BLUE SHIELD | Admitting: Family Medicine

## 2018-05-22 VITALS — BP 122/81 | HR 110 | Temp 98.7°F | Ht 61.0 in | Wt 280.0 lb

## 2018-05-22 DIAGNOSIS — E559 Vitamin D deficiency, unspecified: Secondary | ICD-10-CM

## 2018-05-22 DIAGNOSIS — Z6841 Body Mass Index (BMI) 40.0 and over, adult: Secondary | ICD-10-CM

## 2018-05-22 DIAGNOSIS — F411 Generalized anxiety disorder: Secondary | ICD-10-CM | POA: Diagnosis not present

## 2018-05-22 DIAGNOSIS — Z9189 Other specified personal risk factors, not elsewhere classified: Secondary | ICD-10-CM

## 2018-05-22 MED ORDER — FLUOXETINE HCL 20 MG PO CAPS: 20.0000 mg | ORAL_CAPSULE | Freq: Every day | ORAL | 0 refills | Status: DC

## 2018-05-23 DIAGNOSIS — M25561 Pain in right knee: Secondary | ICD-10-CM | POA: Diagnosis not present

## 2018-05-26 NOTE — Progress Notes (Signed)
Office: [phone]  /  Fax: [phone]   HPI:   Chief Complaint: OBESITY Cynthia [name] is here to discuss her progress with her obesity treatment plan. She is on the portion control better and make smarter food choices, such as increase vegetables and decrease simple carbohydrates and is following her eating plan approximately 70 % of the time. She states she is exercising 0 minutes 0 times per week. [name] has had a distribution of eating schedule secondary to change in allergy medicine. She anticipates dinner to be most difficult to follow. She has no plans for travel in the next 2 weeks.  Her weight is 280 lb (127 kg) today and has not lost weight since her last visit. She has lost 0 lbs since starting treatment with [place].  Generalized Anxiety Disorder Cynthia [name] denies panic attacks but she notes occasionally feeling of knots in stomach. She is feeling upswing in terms of mood.  Vitamin D Deficiency Cynthia [name] has a diagnosis of vitamin D deficiency. She is currently taking prescription Vit D. She notes fatigue and denies nausea, vomiting or muscle weakness.  At risk for osteopenia and osteoporosis Cynthia [name] is at higher risk of osteopenia and osteoporosis due to vitamin D deficiency.   ALLERGIES: Allergies  Allergen Reactions  . Aquacel [Carboxymethylcellulose] Itching, Rash and Other (See Comments)    Caused blisters  . Penicillins     Unknown reaction Has patient had a PCN reaction causing immediate rash, facial/tongue/throat swelling, SOB or lightheadedness with hypotension: Unknown Has patient had a PCN reaction causing severe rash involving mucus membranes or skin necrosis: Unknown Has patient had a PCN reaction that required hospitalization: No Has patient had a PCN reaction occurring within the last 10 years: No If all of the above answers are "NO", then may proceed with Cephalosporin use.   . Adhesive [Tape] Rash    Prefers to use paper tape  . Latex Rash  . Oxycodone Itching and Rash      MEDICATIONS: Current Outpatient Medications on File Prior to Visit  Medication Sig Dispense Refill  . acetaminophen (TYLENOL) 500 MG tablet Take 1,000 mg by mouth 3 (three) times daily as needed for moderate pain.     Marland Kitchen albuterol (PROAIR HFA) 108 (90 Base) MCG/ACT inhaler Inhale 1-2 puffs into the lungs every 6 (six) hours as needed for wheezing or shortness of breath. 18 g 1  . alum hydroxide-mag trisilicate (GAVISCON) 80-20 MG CHEW chewable tablet Chew 1-2 tablets by mouth daily as needed for indigestion or heartburn.    Marland Kitchen aspirin EC 325 MG tablet Take 1 tablet (325 mg total) by mouth 2 (two) times daily. 60 tablet 3  . fluticasone furoate-vilanterol (BREO ELLIPTA) 200-25 MCG/INH AEPB Inhale 1 puff into the lungs daily. 60 each 3  . HYDROcodone-acetaminophen (NORCO) 10-325 MG tablet Take 1 tablet by mouth every 6 (six) hours as needed.    . hyoscyamine (LEVSIN SL) 0.125 MG SL tablet Take 0.125 mg by mouth 3 (three) times daily with meals.   0  . levocetirizine (XYZAL) 5 MG tablet Take 1 tablet (5 mg total) by mouth every evening. 30 tablet 5  . methocarbamol (ROBAXIN) 500 MG tablet Take 500 mg by mouth 2 (two) times daily as needed for muscle spasms.     . montelukast (SINGULAIR) 10 MG tablet Take 1 tablet (10 mg total) by mouth at bedtime. 30 tablet 5  . norethindrone-ethinyl estradiol (MICROGESTIN FE 1/20) 1-20 MG-MCG tablet Take 1 tablet by mouth daily. (Patient taking differently: Take  1 tablet by mouth at bedtime. ) 3 Package 4  . olopatadine (PATANOL) 0.1 % ophthalmic solution Place 1 drop into both eyes daily as needed for allergies.     . Olopatadine HCl (PAZEO) 0.7 % SOLN Place 1 drop into both eyes 1 day or 1 dose. 1 Bottle 5  . QNASL 80 MCG/ACT AERS Place 1 spray into both nostrils 2 (two) times daily as needed (allergies). 10.6 g 3  . traMADol (ULTRAM) 50 MG tablet Take by mouth every 6 (six) hours as needed.    . traZODone (DESYREL) 100 MG tablet Take 100 mg by mouth at  bedtime.     . Vitamin D, Ergocalciferol, (DRISDOL) 50000 units CAPS capsule Take 1 capsule (50,000 Units total) by mouth every 7 (seven) days. 4 capsule 0   No current facility-administered medications on file prior to visit.     PAST MEDICAL HISTORY: Past Medical History:  Diagnosis Date  . ADHD (attention deficit hyperactivity disorder)   . Allergic rhinitis   . Anxiety   . Asthma   . Back injury    COMPETITIVE INJURY IN HIGH SCHOOL  . Back pain   . Depression   . Fatty liver   . Fibromyalgia   . Gallbladder problem   . GERD (gastroesophageal reflux disease)   . IBS (irritable bowel syndrome)   . Insulin resistance   . Joint pain   . Migraines   . OA (osteoarthritis) of knee   . Obesity   . Pneumonia   . PONV (postoperative nausea and vomiting)   . Seasonal allergies   . Vitamin D deficiency     PAST SURGICAL HISTORY: Past Surgical History:  Procedure Laterality Date  . CHOLECYSTECTOMY    . DG DILATION URETERS    . KNEE ARTHROSCOPY Left   . KNEE ARTHROSCOPY WITH LATERAL MENISECTOMY Right 04/11/2018   Procedure: RIGHT KNEE ARTHROSCOPY WITH PARTIAL MENISECTOMY CHONDROPLASTY;  Surgeon: Eugenia Mcalpine, MD;  Location: [facility];  Service: Orthopedics;  Laterality: Right;  . MOUTH SURGERY    . SHOULDER SURGERY  2002   RIGHT  . WISDOM TOOTH EXTRACTION    . WRIST ARTHROSCOPY     Right    SOCIAL HISTORY: Social History   Tobacco Use  . Smoking status: Never Smoker  . Smokeless tobacco: Never Used  Substance Use Topics  . Alcohol use: Yes    Comment: social  . Drug use: No    FAMILY HISTORY: Family History  Problem Relation Age of Onset  . Multiple sclerosis Mother   . Diabetes Mother   . Hyperlipidemia Mother   . Thyroid disease Mother   . Depression Mother   . Anxiety disorder Mother   . Obesity Mother   . Allergic rhinitis Mother   . Hypertension Father   . Diabetes Father   . Hyperlipidemia Father   . Obesity Father   . Allergic rhinitis Father    . Diabetes Maternal Grandfather   . Cancer Maternal Grandfather        prostate  . Heart disease Maternal Grandfather   . Heart disease Paternal Grandfather   . Allergic rhinitis Paternal Aunt   . Allergic rhinitis Maternal Grandmother   . Asthma Neg Hx   . Eczema Neg Hx   . Urticaria Neg Hx     ROS: Review of Systems  Constitutional: Positive for malaise/fatigue. Negative for weight loss.  Gastrointestinal: Negative for nausea and vomiting.  Musculoskeletal:       Negative muscle weakness  PHYSICAL EXAM: Blood pressure 122/81, pulse (!) 110, temperature 98.7 F (37.1 C), temperature source Oral, height 5\' 1"  (1.549 m), weight 280 lb (127 kg), SpO2 92 %. Body mass index is 52.91 kg/m. Physical Exam  Constitutional: She is oriented to person, place, and time. She appears well-developed and well-nourished.  Cardiovascular: Normal rate.  Pulmonary/Chest: Effort normal.  Musculoskeletal: Normal range of motion.  Neurological: She is oriented to person, place, and time.  Skin: Skin is warm and dry.  Psychiatric: She has a normal mood and affect. Her behavior is normal.  Vitals reviewed.   RECENT LABS AND TESTS: BMET    Component Value Date/Time   NA 142 04/08/2018 1420   NA 139 05/01/2017 1009   K 4.6 04/08/2018 1420   CL 107 04/08/2018 1420   CO2 25 04/08/2018 1420   GLUCOSE 87 04/08/2018 1420   BUN 12 04/08/2018 1420   BUN 10 05/01/2017 1009   CREATININE 0.77 04/08/2018 1420   CALCIUM 9.1 04/08/2018 1420   GFRNONAA >60 04/08/2018 1420   GFRAA >60 04/08/2018 1420   Lab Results  Component Value Date   HGBA1C 5.5 01/16/2018   HGBA1C 5.4 10/03/2017   HGBA1C 5.3 05/01/2017   Lab Results  Component Value Date   INSULIN 12.9 01/16/2018   INSULIN 21.7 10/03/2017   INSULIN 11.1 05/01/2017   CBC    Component Value Date/Time   WBC 10.5 04/08/2018 1420   RBC 5.14 (H) 04/08/2018 1420   HGB 15.5 (H) 04/08/2018 1420   HGB 14.0 05/01/2017 1009   HCT 47.3 (H)  04/08/2018 1420   HCT 43.3 05/01/2017 1009   PLT 318 04/08/2018 1420   MCV 92.0 04/08/2018 1420   MCV 93 05/01/2017 1009   MCH 30.2 04/08/2018 1420   MCHC 32.8 04/08/2018 1420   RDW 13.7 04/08/2018 1420   RDW 13.7 05/01/2017 1009   LYMPHSABS 1.5 05/01/2017 1009   MONOABS 0.7 01/09/2012 0956   EOSABS 0.1 05/01/2017 1009   BASOSABS 0.0 05/01/2017 1009   Iron/TIBC/Ferritin/ %Sat No results found for: IRON, TIBC, FERRITIN, IRONPCTSAT Lipid Panel     Component Value Date/Time   CHOL 172 10/03/2017 0821   TRIG 73 10/03/2017 0821   HDL 53 10/03/2017 0821   CHOLHDL 3.4 01/09/2012 0956   VLDL 14 01/09/2012 0956   LDLCALC 104 (H) 10/03/2017 0821   Hepatic Function Panel     Component Value Date/Time   PROT 6.8 05/01/2017 1009   ALBUMIN 4.2 05/01/2017 1009   AST 15 05/01/2017 1009   ALT 14 05/01/2017 1009   ALKPHOS 61 05/01/2017 1009   BILITOT 0.3 05/01/2017 1009      Component Value Date/Time   TSH 1.640 05/01/2017 1009   TSH 1.850 01/09/2012 0956    ASSESSMENT AND PLAN: GAD (generalized anxiety disorder) - Plan: FLUoxetine (PROZAC) 20 MG capsule  Vitamin D deficiency  At risk for osteomyelitis  Class 3 severe obesity with serious comorbidity and body mass index (BMI) of 50.0 to 59.9 in adult, unspecified obesity type (HCC)  PLAN:  Generalized Anxiety Disorder Cynthia [name] agrees to increase Prozac to 20 mg PO q daily #30 with no refills. Cynthia [name] agrees to follow up with our clinic in 2 weeks.  Vitamin D Deficiency Cynthia [name] was informed that low vitamin D levels contributes to fatigue and are associated with obesity, breast, and colon cancer. Cynthia [name] agrees to continue taking prescription Vit D @50 ,000 IU every week, no refill needed. She will follow up for routine testing of vitamin  D, at least 2-3 times per year. She was informed of the risk of over-replacement of vitamin D and agrees to not increase her dose unless she discusses this with [place] first. Cynthia [name] agrees to follow up with  our clinic in 2 weeks.  At risk for osteopenia and osteoporosis Cynthia [name] was given extended (15 minutes) osteoporosis prevention counseling today. Cynthia [name] is at risk for osteopenia and osteoporsis due to her vitamin D deficiency. She was encouraged to take her vitamin D and follow her higher calcium diet and increase strengthening exercise to help strengthen her bones and decrease her risk of osteopenia and osteoporosis.  Obesity Cynthia [name] is currently in the action stage of change. As such, her goal is to continue with weight loss efforts She has agreed to follow the Category 2 plan and follow the Pescatarian eating plan Cynthia [name] has been instructed to work up to a goal of 150 minutes of combined cardio and strengthening exercise per week for weight loss and overall health benefits. We discussed the following Behavioral Modification Strategies today: increasing lean protein intake, increasing vegetables, work on meal planning and easy cooking plans, and planning for success   Cynthia [name] has agreed to follow up with our clinic in 2 weeks. She was informed of the importance of frequent follow up visits to maximize her success with intensive lifestyle modifications for her multiple health conditions.   OBESITY BEHAVIORAL INTERVENTION VISIT  Today's visit was # 19   Starting weight: 264 lbs Starting date: 05/01/17 Today's weight : 280 lbs Today's date: 05/22/2018 Total lbs lost to date: 0    ASK: We discussed the diagnosis of obesity with Cynthia [name] today and Cynthia [name] agreed to give [place] permission to discuss obesity behavioral modification therapy today.  ASSESS: Cynthia [name] has the diagnosis of obesity and her BMI today is 20.93 Cynthia [name] is in the action stage of change   ADVISE: Cynthia [name] was educated on the multiple health risks of obesity as well as the benefit of weight loss to improve her health. She was advised of the need for long term treatment and the importance of lifestyle modifications to improve  her current health and to decrease her risk of future health problems.  AGREE: Multiple dietary modification options and treatment options were discussed and  Cynthia [name] agreed to follow the recommendations documented in the above note.  ARRANGE: Cynthia [name] was educated on the importance of frequent visits to treat obesity as outlined per CMS and USPSTF guidelines and agreed to schedule her next follow up appointment today.  I, Burt Knack, am acting as transcriptionist for Debbra Riding, MD  I have reviewed the above documentation for accuracy and completeness, and I agree with the above. - Debbra Riding, MD

## 2018-05-28 DIAGNOSIS — M25561 Pain in right knee: Secondary | ICD-10-CM | POA: Diagnosis not present

## 2018-05-30 DIAGNOSIS — M25562 Pain in left knee: Secondary | ICD-10-CM | POA: Diagnosis not present

## 2018-05-30 DIAGNOSIS — Z23 Encounter for immunization: Secondary | ICD-10-CM | POA: Diagnosis not present

## 2018-06-03 DIAGNOSIS — M25561 Pain in right knee: Secondary | ICD-10-CM | POA: Diagnosis not present

## 2018-06-03 DIAGNOSIS — M5030 Other cervical disc degeneration, unspecified cervical region: Secondary | ICD-10-CM | POA: Insufficient documentation

## 2018-06-03 DIAGNOSIS — M5136 Other intervertebral disc degeneration, lumbar region: Secondary | ICD-10-CM | POA: Insufficient documentation

## 2018-06-06 DIAGNOSIS — M25561 Pain in right knee: Secondary | ICD-10-CM | POA: Diagnosis not present

## 2018-06-10 DIAGNOSIS — M25561 Pain in right knee: Secondary | ICD-10-CM | POA: Diagnosis not present

## 2018-06-12 ENCOUNTER — Ambulatory Visit (INDEPENDENT_AMBULATORY_CARE_PROVIDER_SITE_OTHER): Payer: BLUE CROSS/BLUE SHIELD | Admitting: Family Medicine

## 2018-06-12 VITALS — BP 119/79 | HR 111 | Temp 98.1°F

## 2018-06-12 DIAGNOSIS — Z9189 Other specified personal risk factors, not elsewhere classified: Secondary | ICD-10-CM

## 2018-06-12 DIAGNOSIS — E559 Vitamin D deficiency, unspecified: Secondary | ICD-10-CM

## 2018-06-12 DIAGNOSIS — F411 Generalized anxiety disorder: Secondary | ICD-10-CM | POA: Diagnosis not present

## 2018-06-12 DIAGNOSIS — Z6841 Body Mass Index (BMI) 40.0 and over, adult: Secondary | ICD-10-CM

## 2018-06-12 MED ORDER — VITAMIN D (ERGOCALCIFEROL) 1.25 MG (50000 UNIT) PO CAPS: 50000.0000 [IU] | ORAL_CAPSULE | ORAL | 0 refills | Status: DC

## 2018-06-12 MED ORDER — FLUOXETINE HCL 20 MG PO CAPS: 20.0000 mg | ORAL_CAPSULE | Freq: Every day | ORAL | 0 refills | Status: DC

## 2018-06-13 DIAGNOSIS — M25561 Pain in right knee: Secondary | ICD-10-CM | POA: Diagnosis not present

## 2018-06-17 DIAGNOSIS — M25561 Pain in right knee: Secondary | ICD-10-CM | POA: Diagnosis not present

## 2018-06-17 NOTE — Progress Notes (Signed)
Office: [phone]  /  Fax: [phone]   HPI:   Chief Complaint: OBESITY Cynthia [name] is here to discuss her progress with her obesity treatment plan. She is on the  follow the Category 1 plan or Pescatarian plan and is following her eating plan approximately 80% of the time. She states she is exercising 0 minutes 0 times per week. Cynthia [name] reports the first 2 weeks after last appt were difficult especially struggling to eat all the food. Last week she got back on track and has committed 100%. She is planning to go to [place], [place] for Thanksgiving. She will be staying in hotel so she voices concern about staying on track.   Her weight is (P) 277 lb (125.6 kg) today and has had a weight loss of 3 pounds over a period of 3 weeks since her last visit. She has lost 0 lbs since starting treatment with [place].  Vitamin D deficiency Cynthia [name] has a diagnosis of vitamin D deficiency. She is currently taking vit D and denies nausea, vomiting or muscle weakness. She reports fatigue is improved.   Ref. Range 01/16/2018 13:47  Vitamin D, 25-Hydroxy Latest Ref Range: 30.0 - 100.0 ng/mL 33.8   Depression with GAD Cynthia [name] is struggling with emotional eating and using food for comfort to the extent that it is negatively impacting her health. She often snacks when she is not hungry. Cynthia [name] sometimes feels she is out of control and then feels guilty that she made poor food choices. She has been working on behavior modification techniques to help reduce her emotional eating and has been somewhat successful. She shows no sign of suicidal or homicidal ideations. She denies panic attacks.   Depression screen PHQ 2/9 05/01/2017  Decreased Interest 1  Down, Depressed, Hopeless 1  PHQ - 2 Score 2  Altered sleeping 0  Tired, decreased energy 1  Change in appetite 3  Feeling bad or failure about yourself  0  Trouble concentrating 1  Moving slowly or fidgety/restless 0  Suicidal thoughts 0  PHQ-9 Score 7  Difficult doing  work/chores Somewhat difficult   At risk for osteopenia and osteoporosis Cynthia [name] is at higher risk of osteopenia and osteoporosis due to vitamin D deficiency.   ALLERGIES: Allergies  Allergen Reactions  . Aquacel [Carboxymethylcellulose] Itching, Rash and Other (See Comments)    Caused blisters  . Penicillins     Unknown reaction Has patient had a PCN reaction causing immediate rash, facial/tongue/throat swelling, SOB or lightheadedness with hypotension: Unknown Has patient had a PCN reaction causing severe rash involving mucus membranes or skin necrosis: Unknown Has patient had a PCN reaction that required hospitalization: No Has patient had a PCN reaction occurring within the last 10 years: No If all of the above answers are "NO", then may proceed with Cephalosporin use.   . Adhesive [Tape] Rash    Prefers to use paper tape  . Latex Rash  . Oxycodone Itching and Rash    MEDICATIONS: Current Outpatient Medications on File Prior to Visit  Medication Sig Dispense Refill  . acetaminophen (TYLENOL) 500 MG tablet Take 1,000 mg by mouth 3 (three) times daily as needed for moderate pain.     Marland Kitchen albuterol (PROAIR HFA) 108 (90 Base) MCG/ACT inhaler Inhale 1-2 puffs into the lungs every 6 (six) hours as needed for wheezing or shortness of breath. 18 g 1  . alum hydroxide-mag trisilicate (GAVISCON) 80-20 MG CHEW chewable tablet Chew 1-2 tablets by mouth daily as needed for indigestion or heartburn.    Marland Kitchen  aspirin EC 325 MG tablet Take 1 tablet (325 mg total) by mouth 2 (two) times daily. 60 tablet 3  . fluticasone furoate-vilanterol (BREO ELLIPTA) 200-25 MCG/INH AEPB Inhale 1 puff into the lungs daily. 60 each 3  . HYDROcodone-acetaminophen (NORCO) 10-325 MG tablet Take 1 tablet by mouth every 6 (six) hours as needed.    . hyoscyamine (LEVSIN SL) 0.125 MG SL tablet Take 0.125 mg by mouth 3 (three) times daily with meals.   0  . levocetirizine (XYZAL) 5 MG tablet Take 1 tablet (5 mg total) by mouth  every evening. 30 tablet 5  . methocarbamol (ROBAXIN) 500 MG tablet Take 500 mg by mouth 2 (two) times daily as needed for muscle spasms.     . montelukast (SINGULAIR) 10 MG tablet Take 1 tablet (10 mg total) by mouth at bedtime. 30 tablet 5  . norethindrone-ethinyl estradiol (MICROGESTIN FE 1/20) 1-20 MG-MCG tablet Take 1 tablet by mouth daily. (Patient taking differently: Take 1 tablet by mouth at bedtime. ) 3 Package 4  . olopatadine (PATANOL) 0.1 % ophthalmic solution Place 1 drop into both eyes daily as needed for allergies.     . Olopatadine HCl (PAZEO) 0.7 % SOLN Place 1 drop into both eyes 1 day or 1 dose. 1 Bottle 5  . QNASL 80 MCG/ACT AERS Place 1 spray into both nostrils 2 (two) times daily as needed (allergies). 10.6 g 3  . traMADol (ULTRAM) 50 MG tablet Take by mouth every 6 (six) hours as needed.    . traZODone (DESYREL) 100 MG tablet Take 100 mg by mouth at bedtime.      No current facility-administered medications on file prior to visit.     PAST MEDICAL HISTORY: Past Medical History:  Diagnosis Date  . ADHD (attention deficit hyperactivity disorder)   . Allergic rhinitis   . Anxiety   . Asthma   . Back injury    COMPETITIVE INJURY IN HIGH SCHOOL  . Back pain   . Depression   . Fatty liver   . Fibromyalgia   . Gallbladder problem   . GERD (gastroesophageal reflux disease)   . IBS (irritable bowel syndrome)   . Insulin resistance   . Joint pain   . Migraines   . OA (osteoarthritis) of knee   . Obesity   . Pneumonia   . PONV (postoperative nausea and vomiting)   . Seasonal allergies   . Vitamin D deficiency     PAST SURGICAL HISTORY: Past Surgical History:  Procedure Laterality Date  . CHOLECYSTECTOMY    . DG DILATION URETERS    . KNEE ARTHROSCOPY Left   . KNEE ARTHROSCOPY WITH LATERAL MENISECTOMY Right 04/11/2018   Procedure: RIGHT KNEE ARTHROSCOPY WITH PARTIAL MENISECTOMY CHONDROPLASTY;  Surgeon: Eugenia Mcalpine, MD;  Location: [facility];  Service:  Orthopedics;  Laterality: Right;  . MOUTH SURGERY    . SHOULDER SURGERY  2002   RIGHT  . WISDOM TOOTH EXTRACTION    . WRIST ARTHROSCOPY     Right    SOCIAL HISTORY: Social History   Tobacco Use  . Smoking status: Never Smoker  . Smokeless tobacco: Never Used  Substance Use Topics  . Alcohol use: Yes    Comment: social  . Drug use: No    FAMILY HISTORY: Family History  Problem Relation Age of Onset  . Multiple sclerosis Mother   . Diabetes Mother   . Hyperlipidemia Mother   . Thyroid disease Mother   . Depression Mother   .  Anxiety disorder Mother   . Obesity Mother   . Allergic rhinitis Mother   . Hypertension Father   . Diabetes Father   . Hyperlipidemia Father   . Obesity Father   . Allergic rhinitis Father   . Diabetes Maternal Grandfather   . Cancer Maternal Grandfather        prostate  . Heart disease Maternal Grandfather   . Heart disease Paternal Grandfather   . Allergic rhinitis Paternal Aunt   . Allergic rhinitis Maternal Grandmother   . Asthma Neg Hx   . Eczema Neg Hx   . Urticaria Neg Hx     ROS: Review of Systems  Constitutional: Positive for weight loss. Negative for malaise/fatigue.  Gastrointestinal: Negative for nausea and vomiting.  Genitourinary:       Negative for muscle weakness  Psychiatric/Behavioral: Positive for depression. Negative for suicidal ideas.       Negative for homicidal ideations Negative for panic attacks    PHYSICAL EXAM: Blood pressure 119/79, pulse (!) 111, temperature 98.1 F (36.7 C), temperature source Oral, height (P) 5\' 1"  (1.549 m), weight (P) 277 lb (125.6 kg), SpO2 96 %. Body mass index is 52.34 kg/m (pended). Physical Exam  Constitutional: She is oriented to person, place, and time. She appears well-developed and well-nourished.  HENT:  Head: Normocephalic.  Neck: Normal range of motion.  Cardiovascular: Normal rate.  Pulmonary/Chest: Effort normal.  Musculoskeletal: Normal range of motion.    Neurological: She is alert and oriented to person, place, and time.  Skin: Skin is warm and dry.  Psychiatric: She has a normal mood and affect. Her behavior is normal.  Vitals reviewed.   RECENT LABS AND TESTS: BMET    Component Value Date/Time   NA 142 04/08/2018 1420   NA 139 05/01/2017 1009   K 4.6 04/08/2018 1420   CL 107 04/08/2018 1420   CO2 25 04/08/2018 1420   GLUCOSE 87 04/08/2018 1420   BUN 12 04/08/2018 1420   BUN 10 05/01/2017 1009   CREATININE 0.77 04/08/2018 1420   CALCIUM 9.1 04/08/2018 1420   GFRNONAA >60 04/08/2018 1420   GFRAA >60 04/08/2018 1420   Lab Results  Component Value Date   HGBA1C 5.5 01/16/2018   HGBA1C 5.4 10/03/2017   HGBA1C 5.3 05/01/2017   Lab Results  Component Value Date   INSULIN 12.9 01/16/2018   INSULIN 21.7 10/03/2017   INSULIN 11.1 05/01/2017   CBC    Component Value Date/Time   WBC 10.5 04/08/2018 1420   RBC 5.14 (H) 04/08/2018 1420   HGB 15.5 (H) 04/08/2018 1420   HGB 14.0 05/01/2017 1009   HCT 47.3 (H) 04/08/2018 1420   HCT 43.3 05/01/2017 1009   PLT 318 04/08/2018 1420   MCV 92.0 04/08/2018 1420   MCV 93 05/01/2017 1009   MCH 30.2 04/08/2018 1420   MCHC 32.8 04/08/2018 1420   RDW 13.7 04/08/2018 1420   RDW 13.7 05/01/2017 1009   LYMPHSABS 1.5 05/01/2017 1009   MONOABS 0.7 01/09/2012 0956   EOSABS 0.1 05/01/2017 1009   BASOSABS 0.0 05/01/2017 1009   Iron/TIBC/Ferritin/ %Sat No results found for: IRON, TIBC, FERRITIN, IRONPCTSAT Lipid Panel     Component Value Date/Time   CHOL 172 10/03/2017 0821   TRIG 73 10/03/2017 0821   HDL 53 10/03/2017 0821   CHOLHDL 3.4 01/09/2012 0956   VLDL 14 01/09/2012 0956   LDLCALC 104 (H) 10/03/2017 0821   Hepatic Function Panel     Component Value Date/Time  PROT 6.8 05/01/2017 1009   ALBUMIN 4.2 05/01/2017 1009   AST 15 05/01/2017 1009   ALT 14 05/01/2017 1009   ALKPHOS 61 05/01/2017 1009   BILITOT 0.3 05/01/2017 1009      Component Value Date/Time   TSH 1.640  05/01/2017 1009   TSH 1.850 01/09/2012 0956    Ref. Range 01/16/2018 13:47  Vitamin D, 25-Hydroxy Latest Ref Range: 30.0 - 100.0 ng/mL 33.8    ASSESSMENT AND PLAN: GAD (generalized anxiety disorder) - with depression - Plan: FLUoxetine (PROZAC) 20 MG capsule  Vitamin D deficiency - Plan: Vitamin D, Ergocalciferol, (DRISDOL) 1.25 MG (50000 UT) CAPS capsule  At risk for osteoporosis  Class 3 severe obesity without serious comorbidity with body mass index (BMI) of 50.0 to 59.9 in adult, unspecified obesity type (HCC)  PLAN: Vitamin D Deficiency Cynthia [name] was informed that low vitamin D levels contributes to fatigue and are associated with obesity, breast, and colon cancer. She agrees to continue to take prescription Vit D @50 ,000 IU every week #4 with no refills and will follow up for routine testing of vitamin D, at least 2-3 times per year. She was informed of the risk of over-replacement of vitamin D and agrees to not increase her dose unless she discusses this with us first. Agrees to follow up with our clinic as directed.   Depression with GAD We discussed behavior modification techniques today to help Cynthia [name] deal with her emotional eating and depression. She has agreed to continue to take fluoxetine 20 mg daily #30 with no refills and agreed to follow up as directed.  At risk for osteopenia and osteoporosis Cynthia [name] was given extended  (15 minutes) osteoporosis prevention counseling today. Cynthia [name] is at risk for osteopenia and osteoporsis due to her vitamin D deficiency. She was encouraged to take her vitamin D and follow her higher calcium diet and increase strengthening exercise to help strengthen her bones and decrease her risk of osteopenia and osteoporosis.  Obesity Cynthia [name] is currently in the action stage of change. As such, her goal is to continue with weight loss efforts She has agreed to keep a food journal with 1300-1400 calories and 90+g of protein daily Cynthia [name] has been instructed to  work up to a goal of 150 minutes of combined cardio and strengthening exercise per week for weight loss and overall health benefits. We discussed the following Behavioral Modification Stratagies today: increasing lean protein intake, increasing vegetables, planning for success, and work on meal planning and easy cooking plans.    Cynthia [name] has agreed to follow up with our clinic in 2 weeks. She was informed of the importance of frequent follow up visits to maximize her success with intensive lifestyle modifications for her multiple health conditions.   OBESITY BEHAVIORAL INTERVENTION VISIT  Today's visit was # 20   Starting weight: 264 lb  Starting date: 05/01/17 Today's weight : Weight: (P) 277 lb (125.6 kg)  Today's date: 06/12/18 Total lbs lost to date: 0    ASK: We discussed the diagnosis of obesity with Simonne MaffucciSarah C Brackeen today and Edythe agreed to give us permission to discuss obesity behavioral modification therapy today.  ASSESS: Cynthia [name] has the diagnosis of obesity and her BMI today is 5852.93 Cynthia [name] is in the action stage of change   ADVISE: Cynthia [name] was educated on the multiple health risks of obesity as well as the benefit of weight loss to improve her health. She was advised of the need for long term treatment and the importance of lifestyle  modifications to improve her current health and to decrease her risk of future health problems.  AGREE: Multiple dietary modification options and treatment options were discussed and  Mamye agreed to follow the recommendations documented in the above note.  ARRANGE: Tiki was educated on the importance of frequent visits to treat obesity as outlined per CMS and USPSTF guidelines and agreed to schedule her next follow up appointment today.  I, Jeralene Peters, am acting as transcriptionist for Debbra Riding, MD   I have reviewed the above documentation for accuracy and completeness, and I agree with the above. - Debbra Riding, MD

## 2018-06-26 DIAGNOSIS — M5030 Other cervical disc degeneration, unspecified cervical region: Secondary | ICD-10-CM | POA: Diagnosis not present

## 2018-06-26 DIAGNOSIS — M5136 Other intervertebral disc degeneration, lumbar region: Secondary | ICD-10-CM | POA: Diagnosis not present

## 2018-07-02 ENCOUNTER — Ambulatory Visit (INDEPENDENT_AMBULATORY_CARE_PROVIDER_SITE_OTHER): Payer: BLUE CROSS/BLUE SHIELD | Admitting: Family Medicine

## 2018-07-02 ENCOUNTER — Encounter (INDEPENDENT_AMBULATORY_CARE_PROVIDER_SITE_OTHER): Payer: Self-pay | Admitting: Family Medicine

## 2018-07-02 VITALS — BP 137/83 | HR 105 | Temp 98.1°F | Ht 62.0 in | Wt 281.0 lb

## 2018-07-02 DIAGNOSIS — Z9189 Other specified personal risk factors, not elsewhere classified: Secondary | ICD-10-CM | POA: Diagnosis not present

## 2018-07-02 DIAGNOSIS — E559 Vitamin D deficiency, unspecified: Secondary | ICD-10-CM

## 2018-07-02 DIAGNOSIS — Z6841 Body Mass Index (BMI) 40.0 and over, adult: Secondary | ICD-10-CM

## 2018-07-02 DIAGNOSIS — F411 Generalized anxiety disorder: Secondary | ICD-10-CM

## 2018-07-02 DIAGNOSIS — E66813 Obesity, class 3: Secondary | ICD-10-CM

## 2018-07-02 MED ORDER — VITAMIN D (ERGOCALCIFEROL) 1.25 MG (50000 UNIT) PO CAPS: 50000.0000 [IU] | ORAL_CAPSULE | ORAL | 0 refills | Status: DC

## 2018-07-02 MED ORDER — FLUOXETINE HCL 10 MG PO CAPS: 10.0000 mg | ORAL_CAPSULE | ORAL | 0 refills | Status: DC

## 2018-07-02 NOTE — Progress Notes (Signed)
Office: [phone]  /  Fax: [phone]   HPI:   Chief Complaint: OBESITY Domingue is here to discuss her progress with her obesity treatment plan. She is on the keep a food journal with 1300-1400 calories and 90+ grams of protein daily and is following her eating plan approximately 60 % of the time. She states she is exercising 0 minutes 0 times per week. Germaine went to [facility] for Thanksgiving and had to eat out more than she wanted. She has noticed decrease in diuresis even with following the plan 100%. She notes feeling bloated.  Her weight is 281 lb (127.5 kg) today and has gained 4 pounds since her last visit. She has lost 0 lbs since starting treatment with [place].  GAD Rayshawn notes increase in anxiety recently and increased panic attacks.  Vitamin D Deficiency Yilia has a diagnosis of vitamin D deficiency. She is currently taking prescription Vit D. She notes fatigue and denies nausea, vomiting or muscle weakness.  At risk for osteopenia and osteoporosis Judeen is at higher risk of osteopenia and osteoporosis due to vitamin D deficiency.   ALLERGIES: Allergies  Allergen Reactions  . Aquacel [Carboxymethylcellulose] Itching, Rash and Other (See Comments)    Caused blisters  . Penicillins     Unknown reaction Has patient had a PCN reaction causing immediate rash, facial/tongue/throat swelling, SOB or lightheadedness with hypotension: Unknown Has patient had a PCN reaction causing severe rash involving mucus membranes or skin necrosis: Unknown Has patient had a PCN reaction that required hospitalization: No Has patient had a PCN reaction occurring within the last 10 years: No If all of the above answers are "NO", then may proceed with Cephalosporin use.   . Adhesive [Tape] Rash    Prefers to use paper tape  . Latex Rash  . Oxycodone Itching and Rash    MEDICATIONS: Current Outpatient Medications on File Prior to Visit  Medication Sig Dispense Refill  . acetaminophen  (TYLENOL) 500 MG tablet Take 1,000 mg by mouth 3 (three) times daily as needed for moderate pain.     Marland Kitchen albuterol (PROAIR HFA) 108 (90 Base) MCG/ACT inhaler Inhale 1-2 puffs into the lungs every 6 (six) hours as needed for wheezing or shortness of breath. 18 g 1  . alum hydroxide-mag trisilicate (GAVISCON) 80-20 MG CHEW chewable tablet Chew 1-2 tablets by mouth daily as needed for indigestion or heartburn.    Marland Kitchen aspirin EC 325 MG tablet Take 1 tablet (325 mg total) by mouth 2 (two) times daily. 60 tablet 3  . FLUoxetine (PROZAC) 20 MG capsule Take 1 capsule (20 mg total) by mouth daily. 30 capsule 0  . fluticasone furoate-vilanterol (BREO ELLIPTA) 200-25 MCG/INH AEPB Inhale 1 puff into the lungs daily. 60 each 3  . HYDROcodone-acetaminophen (NORCO) 10-325 MG tablet Take 1 tablet by mouth every 6 (six) hours as needed.    . hyoscyamine (LEVSIN SL) 0.125 MG SL tablet Take 0.125 mg by mouth 3 (three) times daily with meals.   0  . levocetirizine (XYZAL) 5 MG tablet Take 1 tablet (5 mg total) by mouth every evening. 30 tablet 5  . methocarbamol (ROBAXIN) 500 MG tablet Take 500 mg by mouth 2 (two) times daily as needed for muscle spasms.     . montelukast (SINGULAIR) 10 MG tablet Take 1 tablet (10 mg total) by mouth at bedtime. 30 tablet 5  . norethindrone-ethinyl estradiol (MICROGESTIN FE 1/20) 1-20 MG-MCG tablet Take 1 tablet by mouth daily. (Patient taking differently: Take 1  tablet by mouth at bedtime. ) 3 Package 4  . olopatadine (PATANOL) 0.1 % ophthalmic solution Place 1 drop into both eyes daily as needed for allergies.     . Olopatadine HCl (PAZEO) 0.7 % SOLN Place 1 drop into both eyes 1 day or 1 dose. 1 Bottle 5  . QNASL 80 MCG/ACT AERS Place 1 spray into both nostrils 2 (two) times daily as needed (allergies). 10.6 g 3  . traMADol (ULTRAM) 50 MG tablet Take by mouth every 6 (six) hours as needed.    . traZODone (DESYREL) 100 MG tablet Take 100 mg by mouth at bedtime.      No current  facility-administered medications on file prior to visit.     PAST MEDICAL HISTORY: Past Medical History:  Diagnosis Date  . ADHD (attention deficit hyperactivity disorder)   . Allergic rhinitis   . Anxiety   . Asthma   . Back injury    COMPETITIVE INJURY IN HIGH SCHOOL  . Back pain   . Depression   . Fatty liver   . Fibromyalgia   . Gallbladder problem   . GERD (gastroesophageal reflux disease)   . IBS (irritable bowel syndrome)   . Insulin resistance   . Joint pain   . Migraines   . OA (osteoarthritis) of knee   . Obesity   . Pneumonia   . PONV (postoperative nausea and vomiting)   . Seasonal allergies   . Vitamin D deficiency     PAST SURGICAL HISTORY: Past Surgical History:  Procedure Laterality Date  . CHOLECYSTECTOMY    . DG DILATION URETERS    . KNEE ARTHROSCOPY Left   . KNEE ARTHROSCOPY WITH LATERAL MENISECTOMY Right 04/11/2018   Procedure: RIGHT KNEE ARTHROSCOPY WITH PARTIAL MENISECTOMY CHONDROPLASTY;  Surgeon: Eugenia Mcalpine, MD;  Location: [facility];  Service: Orthopedics;  Laterality: Right;  . MOUTH SURGERY    . SHOULDER SURGERY  2002   RIGHT  . WISDOM TOOTH EXTRACTION    . WRIST ARTHROSCOPY     Right    SOCIAL HISTORY: Social History   Tobacco Use  . Smoking status: Never Smoker  . Smokeless tobacco: Never Used  Substance Use Topics  . Alcohol use: Yes    Comment: social  . Drug use: No    FAMILY HISTORY: Family History  Problem Relation Age of Onset  . Multiple sclerosis Mother   . Diabetes Mother   . Hyperlipidemia Mother   . Thyroid disease Mother   . Depression Mother   . Anxiety disorder Mother   . Obesity Mother   . Allergic rhinitis Mother   . Hypertension Father   . Diabetes Father   . Hyperlipidemia Father   . Obesity Father   . Allergic rhinitis Father   . Diabetes Maternal Grandfather   . Cancer Maternal Grandfather        prostate  . Heart disease Maternal Grandfather   . Heart disease Paternal Grandfather   .  Allergic rhinitis Paternal Aunt   . Allergic rhinitis Maternal Grandmother   . Asthma Neg Hx   . Eczema Neg Hx   . Urticaria Neg Hx     ROS: Review of Systems  Constitutional: Positive for malaise/fatigue. Negative for weight loss.  Gastrointestinal: Negative for nausea and vomiting.  Musculoskeletal:       Negative muscle weakness  Psychiatric/Behavioral:       + Anxiety    PHYSICAL EXAM: Blood pressure 137/83, pulse (!) 105, temperature 98.1 F (36.7 C),  temperature source Oral, height 5\' 2"  (1.575 m), weight 281 lb (127.5 kg), SpO2 95 %. Body mass index is 51.4 kg/m. Physical Exam  Constitutional: She is oriented to person, place, and time. She appears well-developed and well-nourished.  Cardiovascular: Normal rate.  Pulmonary/Chest: Effort normal.  Musculoskeletal: Normal range of motion.  Neurological: She is oriented to person, place, and time.  Skin: Skin is warm and dry.  Psychiatric: She has a normal mood and affect. Her behavior is normal.  Vitals reviewed.   RECENT LABS AND TESTS: BMET    Component Value Date/Time   NA 142 04/08/2018 1420   NA 139 05/01/2017 1009   K 4.6 04/08/2018 1420   CL 107 04/08/2018 1420   CO2 25 04/08/2018 1420   GLUCOSE 87 04/08/2018 1420   BUN 12 04/08/2018 1420   BUN 10 05/01/2017 1009   CREATININE 0.77 04/08/2018 1420   CALCIUM 9.1 04/08/2018 1420   GFRNONAA >60 04/08/2018 1420   GFRAA >60 04/08/2018 1420   Lab Results  Component Value Date   HGBA1C 5.5 01/16/2018   HGBA1C 5.4 10/03/2017   HGBA1C 5.3 05/01/2017   Lab Results  Component Value Date   INSULIN 12.9 01/16/2018   INSULIN 21.7 10/03/2017   INSULIN 11.1 05/01/2017   CBC    Component Value Date/Time   WBC 10.5 04/08/2018 1420   RBC 5.14 (H) 04/08/2018 1420   HGB 15.5 (H) 04/08/2018 1420   HGB 14.0 05/01/2017 1009   HCT 47.3 (H) 04/08/2018 1420   HCT 43.3 05/01/2017 1009   PLT 318 04/08/2018 1420   MCV 92.0 04/08/2018 1420   MCV 93 05/01/2017 1009     MCH 30.2 04/08/2018 1420   MCHC 32.8 04/08/2018 1420   RDW 13.7 04/08/2018 1420   RDW 13.7 05/01/2017 1009   LYMPHSABS 1.5 05/01/2017 1009   MONOABS 0.7 01/09/2012 0956   EOSABS 0.1 05/01/2017 1009   BASOSABS 0.0 05/01/2017 1009   Iron/TIBC/Ferritin/ %Sat No results found for: IRON, TIBC, FERRITIN, IRONPCTSAT Lipid Panel     Component Value Date/Time   CHOL 172 10/03/2017 0821   TRIG 73 10/03/2017 0821   HDL 53 10/03/2017 0821   CHOLHDL 3.4 01/09/2012 0956   VLDL 14 01/09/2012 0956   LDLCALC 104 (H) 10/03/2017 0821   Hepatic Function Panel     Component Value Date/Time   PROT 6.8 05/01/2017 1009   ALBUMIN 4.2 05/01/2017 1009   AST 15 05/01/2017 1009   ALT 14 05/01/2017 1009   ALKPHOS 61 05/01/2017 1009   BILITOT 0.3 05/01/2017 1009      Component Value Date/Time   TSH 1.640 05/01/2017 1009   TSH 1.850 01/09/2012 0956  Results for KATHRENE, SINOPOLI (MRN [id number]) as of 07/02/2018 14:42  Ref. Range 01/16/2018 13:47  Vitamin D, 25-Hydroxy Latest Ref Range: 30.0 - 100.0 ng/mL 33.8    ASSESSMENT AND PLAN: GAD (generalized anxiety disorder)  Vitamin D deficiency - Plan: Vitamin D, Ergocalciferol, (DRISDOL) 1.25 MG (50000 UT) CAPS capsule  At risk for osteoporosis  Class 3 severe obesity with serious comorbidity and body mass index (BMI) of 50.0 to 59.9 in adult, unspecified obesity type [facility])  PLAN:  GAD Versia agrees to increase Fluoxetine to 10 mg 3 capsules PO q daily #90 with no refills. Makyna agrees to follow up with our clinic in 3 weeks.  Vitamin D Deficiency Aliz was informed that low vitamin D levels contributes to fatigue and are associated with obesity, breast, and colon cancer. Dierra agrees  to continue taking prescription Vit D @50 ,000 IU every week #4 and we will refill for 1 month. She will follow up for routine testing of vitamin D, at least 2-3 times per year. She was informed of the risk of over-replacement of vitamin D and agrees to not increase  her dose unless she discusses this with us first. [name] agrees to follow up with our clinic in 3 weeks.  At risk for osteopenia and osteoporosis [name] was given extended (15 minutes) osteoporosis prevention counseling today. [name] is at risk for osteopenia and osteoporsis due to her vitamin D deficiency. She was encouraged to take her vitamin D and follow her higher calcium diet and increase strengthening exercise to help strengthen her bones and decrease her risk of osteopenia and osteoporosis.  Obesity [name] is currently in the action stage of change. As such, her goal is to continue with weight loss efforts She has agreed to follow the Category 2 plan or follow the Pescatarian eating plan [name] has been instructed to work up to a goal of 150 minutes of combined cardio and strengthening exercise per week for weight loss and overall health benefits. We discussed the following Behavioral Modification Strategies today: increasing lean protein intake, increasing vegetables, work on meal planning and easy cooking plans, and planning for success   [name] has agreed to follow up with our clinic in 3 weeks. She was informed of the importance of frequent follow up visits to maximize her success with intensive lifestyle modifications for her multiple health conditions.   OBESITY BEHAVIORAL INTERVENTION VISIT  Today's visit was # 21   Starting weight: 264 lbs Starting date: 05/01/17 Today's weight : 281 lbs  Today's date: 07/02/2018 Total lbs lost to date: 0    ASK: We discussed the diagnosis of obesity with Simonne MaffucciSarah C Shrake today and Wonder agreed to give us permission to discuss obesity behavioral modification therapy today.  ASSESS: [name] has the diagnosis of obesity and her BMI today is 2251.38 [name] is in the action stage of change   ADVISE: [name] was educated on the multiple health risks of obesity as well as the benefit of weight loss to improve her health. She was advised of the need  for long term treatment and the importance of lifestyle modifications to improve her current health and to decrease her risk of future health problems.  AGREE: Multiple dietary modification options and treatment options were discussed and  [name] agreed to follow the recommendations documented in the above note.  ARRANGE: [name] was educated on the importance of frequent visits to treat obesity as outlined per CMS and USPSTF guidelines and agreed to schedule her next follow up appointment today.  I, Burt KnackSharon Martin, am acting as transcriptionist for Debbra RidingAlexandria Kadolph, MD  I have reviewed the above documentation for accuracy and completeness, and I agree with the above. - Debbra RidingAlexandria Kadolph, MD

## 2018-07-07 ENCOUNTER — Encounter (INDEPENDENT_AMBULATORY_CARE_PROVIDER_SITE_OTHER): Payer: Self-pay | Admitting: Family Medicine

## 2018-07-08 ENCOUNTER — Other Ambulatory Visit (INDEPENDENT_AMBULATORY_CARE_PROVIDER_SITE_OTHER): Payer: Self-pay | Admitting: Family Medicine

## 2018-07-08 DIAGNOSIS — F411 Generalized anxiety disorder: Secondary | ICD-10-CM

## 2018-07-08 MED ORDER — FLUOXETINE HCL 10 MG PO CAPS: 10.0000 mg | ORAL_CAPSULE | ORAL | 0 refills | Status: DC

## 2018-07-11 DIAGNOSIS — M1711 Unilateral primary osteoarthritis, right knee: Secondary | ICD-10-CM | POA: Diagnosis not present

## 2018-07-17 DIAGNOSIS — M1711 Unilateral primary osteoarthritis, right knee: Secondary | ICD-10-CM | POA: Diagnosis not present

## 2018-07-22 DIAGNOSIS — Z13220 Encounter for screening for lipoid disorders: Secondary | ICD-10-CM | POA: Diagnosis not present

## 2018-07-22 DIAGNOSIS — M1711 Unilateral primary osteoarthritis, right knee: Secondary | ICD-10-CM | POA: Diagnosis not present

## 2018-07-22 DIAGNOSIS — R739 Hyperglycemia, unspecified: Secondary | ICD-10-CM | POA: Diagnosis not present

## 2018-07-22 DIAGNOSIS — M62838 Other muscle spasm: Secondary | ICD-10-CM | POA: Diagnosis not present

## 2018-07-22 DIAGNOSIS — J4530 Mild persistent asthma, uncomplicated: Secondary | ICD-10-CM | POA: Diagnosis not present

## 2018-07-22 DIAGNOSIS — G4700 Insomnia, unspecified: Secondary | ICD-10-CM | POA: Diagnosis not present

## 2018-07-22 DIAGNOSIS — E559 Vitamin D deficiency, unspecified: Secondary | ICD-10-CM | POA: Diagnosis not present

## 2018-07-25 DIAGNOSIS — M545 Low back pain: Secondary | ICD-10-CM | POA: Diagnosis not present

## 2018-07-29 ENCOUNTER — Ambulatory Visit (INDEPENDENT_AMBULATORY_CARE_PROVIDER_SITE_OTHER): Payer: BLUE CROSS/BLUE SHIELD | Admitting: Family Medicine

## 2018-07-29 VITALS — BP 110/80 | HR 110 | Temp 97.9°F | Ht 62.0 in | Wt 273.0 lb

## 2018-07-29 DIAGNOSIS — E559 Vitamin D deficiency, unspecified: Secondary | ICD-10-CM

## 2018-07-29 DIAGNOSIS — F411 Generalized anxiety disorder: Secondary | ICD-10-CM

## 2018-07-29 DIAGNOSIS — Z9189 Other specified personal risk factors, not elsewhere classified: Secondary | ICD-10-CM | POA: Diagnosis not present

## 2018-07-29 DIAGNOSIS — E7849 Other hyperlipidemia: Secondary | ICD-10-CM | POA: Diagnosis not present

## 2018-07-29 DIAGNOSIS — Z6841 Body Mass Index (BMI) 40.0 and over, adult: Secondary | ICD-10-CM

## 2018-07-29 MED ORDER — FLUOXETINE HCL 10 MG PO CAPS: 10.0000 mg | ORAL_CAPSULE | ORAL | 0 refills | Status: DC

## 2018-07-29 NOTE — Progress Notes (Signed)
Office: [phone]  /  Fax: [phone]   HPI:   Chief Complaint: OBESITY Cynthia [name] is here to discuss her progress with her obesity treatment plan. She is on the Category 2 plan or follow the Pescatarian eating plan and is following her eating plan approximately 70 % of the time. She states she is exercising 0 minutes 0 times per week. Cynthia [name] has been trying to be mindful of portion and only occasionally indulging, but controlling the amount and frequency. She is planning [place] in mid January and planning to eat indulgently. Her weight is 273 lb (123.8 kg) today and has had a weight loss of 8 pounds over a period of 3 to 4 weeks since her last visit. She has lost 0 lbs since starting treatment with Cynthia [name].  General Anxiety Disorder Cynthia [name] has GAD, and she denies panic attacks. She notes her symptoms are better controlled.  Vitamin D Deficiency Cynthia [name] has a diagnosis of vitamin D deficiency. She is currently taking prescription Vit D. Last Vit D level was at 27. She notes fatigue and denies nausea, vomiting or muscle weakness.  At risk for osteopenia and osteoporosis Cynthia [name] is at higher risk of osteopenia and osteoporosis due to vitamin D deficiency.   Hyperlipidemia Cynthia [name] has hyperlipidemia and has been trying to improve her cholesterol levels with intensive lifestyle modification including a low saturated fat diet, exercise and weight loss. Her LDL was elevated on last blood work on 07/22/18, and HDL was 52. She denies any chest pain, claudication or myalgias.  ASSESSMENT AND PLAN:  GAD (generalized anxiety disorder) - Plan: FLUoxetine (PROZAC) 10 MG capsule  Vitamin D deficiency  Other hyperlipidemia  At risk for osteoporosis  Class 3 severe obesity with serious comorbidity and body mass index (BMI) of 50.0 to 59.9 in adult, unspecified obesity type [facility](HCC)  PLAN:  General Anxiety Disorder Cynthia [name] agrees to continue taking Fluoxetine 10 mg 3 tablets PO daily  #90 and we will refill for 1 month. Cynthia [name] agrees to follow up with our clinic in 3 weeks.  Vitamin D Deficiency Cynthia [name] was informed that low vitamin D levels contributes to fatigue and are associated with obesity, breast, and colon cancer. Cynthia [name] agrees to continue taking prescription Vit D @50 ,000 IU every week, no refill needed. She will follow up for routine testing of vitamin D, at least 2-3 times per year. She was informed of the risk of over-replacement of vitamin D and agrees to not increase her dose unless she discusses this with Cynthia [name] first. Cynthia [name] agrees to follow up with our clinic in 3 weeks.  At risk for osteopenia and osteoporosis Cynthia [name] was given extended (15 minutes) osteoporosis prevention counseling today. Cynthia [name] is at risk for osteopenia and osteoporsis due to her vitamin D deficiency. She was encouraged to take her vitamin D and follow her higher calcium diet and increase strengthening exercise to help strengthen her bones and decrease her risk of osteopenia and osteoporosis.  Hyperlipidemia Cynthia [name] was informed of the American Heart Association Guidelines emphasizing intensive lifestyle modifications as the first line treatment for hyperlipidemia. We discussed many lifestyle modifications today in depth, and Cynthia [name] will continue to work on decreasing saturated fats such as fatty red meat, butter and many fried foods. She will also increase vegetables and lean protein in her diet and continue to work on exercise and weight loss efforts. We will repeat labs in 3 months. Cynthia [name] agrees to follow up with our clinic in 3 weeks.  Obesity Cynthia [name] is currently in the action stage of change. As such, her goal is to continue with weight loss efforts She has agreed to follow the Category 2 plan or follow the Pescatarian eating plan Cynthia [name] has been instructed to work up to a goal of 150 minutes of combined cardio and strengthening exercise per week for weight loss and overall health benefits. We discussed  the following Behavioral Modification Strategies today: increasing lean protein intake, increasing vegetables, work on meal planning and easy cooking plans, and planning for success   Cynthia [name] has agreed to follow up with our clinic in 3 weeks. She was informed of the importance of frequent follow up visits to maximize her success with intensive lifestyle modifications for her multiple health conditions.  ALLERGIES: Allergies  Allergen Reactions  . Aquacel [Carboxymethylcellulose] Itching, Rash and Other (See Comments)    Caused blisters  . Penicillins     Unknown reaction Has patient had a PCN reaction causing immediate rash, facial/tongue/throat swelling, SOB or lightheadedness with hypotension: Unknown Has patient had a PCN reaction causing severe rash involving mucus membranes or skin necrosis: Unknown Has patient had a PCN reaction that required hospitalization: No Has patient had a PCN reaction occurring within the last 10 years: No If all of the above answers are "NO", then may proceed with Cephalosporin use.   . Adhesive [Tape] Rash    Prefers to use paper tape  . Latex Rash  . Oxycodone Itching and Rash    MEDICATIONS: Current Outpatient Medications on File Prior to Visit  Medication Sig Dispense Refill  . acetaminophen (TYLENOL) 500 MG tablet Take 1,000 mg by mouth 3 (three) times daily as needed for moderate pain.     Marland Kitchen albuterol (PROAIR HFA) 108 (90 Base) MCG/ACT inhaler Inhale 1-2 puffs into the lungs every 6 (six) hours as needed for wheezing or shortness of breath. 18 g 1  . alum hydroxide-mag trisilicate (GAVISCON) 80-20 MG CHEW chewable tablet Chew 1-2 tablets by mouth daily as needed for indigestion or heartburn.    Marland Kitchen aspirin EC 325 MG tablet Take 1 tablet (325 mg total) by mouth 2 (two) times daily. 60 tablet 3  . fluticasone furoate-vilanterol (BREO ELLIPTA) 200-25 MCG/INH AEPB Inhale 1 puff into the lungs daily. 60 each 3  . HYDROcodone-acetaminophen (NORCO) 10-325  MG tablet Take 1 tablet by mouth every 6 (six) hours as needed.    . hyoscyamine (LEVSIN SL) 0.125 MG SL tablet Take 0.125 mg by mouth 3 (three) times daily with meals.   0  . levocetirizine (XYZAL) 5 MG tablet Take 1 tablet (5 mg total) by mouth every evening. 30 tablet 5  . methocarbamol (ROBAXIN) 500 MG tablet Take 500 mg by mouth 2 (two) times daily as needed for muscle spasms.     . montelukast (SINGULAIR) 10 MG tablet Take 1 tablet (10 mg total) by mouth at bedtime. 30 tablet 5  . norethindrone-ethinyl estradiol (MICROGESTIN FE 1/20) 1-20 MG-MCG tablet Take 1 tablet by mouth daily. (Patient taking differently: Take 1 tablet by mouth at bedtime. ) 3 Package 4  . olopatadine (PATANOL) 0.1 % ophthalmic solution Place 1 drop into both eyes daily as needed for allergies.     . Olopatadine HCl (PAZEO) 0.7 % SOLN Place 1 drop into both eyes 1 day or 1 dose. 1 Bottle 5  . QNASL 80 MCG/ACT AERS Place 1 spray into both nostrils 2 (two) times daily as needed (allergies). 10.6 g 3  . traMADol (ULTRAM) 50  MG tablet Take by mouth every 6 (six) hours as needed.    . traZODone (DESYREL) 100 MG tablet Take 100 mg by mouth at bedtime.     . Vitamin D, Ergocalciferol, (DRISDOL) 1.25 MG (50000 UT) CAPS capsule Take 1 capsule (50,000 Units total) by mouth every 7 (seven) days. 4 capsule 0   No current facility-administered medications on file prior to visit.     PAST MEDICAL HISTORY: Past Medical History:  Diagnosis Date  . ADHD (attention deficit hyperactivity disorder)   . Allergic rhinitis   . Anxiety   . Asthma   . Back injury    COMPETITIVE INJURY IN HIGH SCHOOL  . Back pain   . Depression   . Fatty liver   . Fibromyalgia   . Gallbladder problem   . GERD (gastroesophageal reflux disease)   . IBS (irritable bowel syndrome)   . Insulin resistance   . Joint pain   . Migraines   . OA (osteoarthritis) of knee   . Obesity   . Pneumonia   . PONV (postoperative nausea and vomiting)   . Seasonal  allergies   . Vitamin D deficiency     PAST SURGICAL HISTORY: Past Surgical History:  Procedure Laterality Date  . CHOLECYSTECTOMY    . DG DILATION URETERS    . KNEE ARTHROSCOPY Left   . KNEE ARTHROSCOPY WITH LATERAL MENISECTOMY Right 04/11/2018   Procedure: RIGHT KNEE ARTHROSCOPY WITH PARTIAL MENISECTOMY CHONDROPLASTY;  Surgeon: Eugenia Mcalpineollins, Robert, MD;  Location: [facility];  Service: Orthopedics;  Laterality: Right;  . MOUTH SURGERY    . SHOULDER SURGERY  2002   RIGHT  . WISDOM TOOTH EXTRACTION    . WRIST ARTHROSCOPY     Right    SOCIAL HISTORY: Social History   Tobacco Use  . Smoking status: Never Smoker  . Smokeless tobacco: Never Used  Substance Use Topics  . Alcohol use: Yes    Comment: social  . Drug use: No    FAMILY HISTORY: Family History  Problem Relation Age of Onset  . Multiple sclerosis Mother   . Diabetes Mother   . Hyperlipidemia Mother   . Thyroid disease Mother   . Depression Mother   . Anxiety disorder Mother   . Obesity Mother   . Allergic rhinitis Mother   . Hypertension Father   . Diabetes Father   . Hyperlipidemia Father   . Obesity Father   . Allergic rhinitis Father   . Diabetes Maternal Grandfather   . Cancer Maternal Grandfather        prostate  . Heart disease Maternal Grandfather   . Heart disease Paternal Grandfather   . Allergic rhinitis Paternal Aunt   . Allergic rhinitis Maternal Grandmother   . Asthma Neg Hx   . Eczema Neg Hx   . Urticaria Neg Hx     ROS: Review of Systems  Constitutional: Positive for malaise/fatigue and weight loss.  Cardiovascular: Negative for chest pain and claudication.  Gastrointestinal: Negative for nausea and vomiting.  Musculoskeletal: Negative for myalgias.       Negative muscle weakness  Psychiatric/Behavioral:       + Anxiety    PHYSICAL EXAM: Blood pressure 110/80, pulse (!) 110, temperature 97.9 F (36.6 C), temperature source Oral, height 5\' 2"  (1.575 m), weight 273 lb (123.8 kg), SpO2  97 %. Body mass index is 49.93 kg/m. Physical Exam Vitals signs reviewed.  Constitutional:      Appearance: Normal appearance. She is obese.  Cardiovascular:  Rate and Rhythm: Normal rate.     Pulses: Normal pulses.  Pulmonary:     Effort: Pulmonary effort is normal.  Musculoskeletal: Normal range of motion.  Skin:    General: Skin is warm and dry.  Neurological:     Mental Status: She is alert and oriented to person, place, and time.  Psychiatric:        Mood and Affect: Mood normal.        Behavior: Behavior normal.     RECENT LABS AND TESTS: BMET    Component Value Date/Time   NA 142 04/08/2018 1420   NA 139 05/01/2017 1009   K 4.6 04/08/2018 1420   CL 107 04/08/2018 1420   CO2 25 04/08/2018 1420   GLUCOSE 87 04/08/2018 1420   BUN 12 04/08/2018 1420   BUN 10 05/01/2017 1009   CREATININE 0.77 04/08/2018 1420   CALCIUM 9.1 04/08/2018 1420   GFRNONAA >60 04/08/2018 1420   GFRAA >60 04/08/2018 1420   Lab Results  Component Value Date   HGBA1C 5.5 01/16/2018   HGBA1C 5.4 10/03/2017   HGBA1C 5.3 05/01/2017   Lab Results  Component Value Date   INSULIN 12.9 01/16/2018   INSULIN 21.7 10/03/2017   INSULIN 11.1 05/01/2017   CBC    Component Value Date/Time   WBC 10.5 04/08/2018 1420   RBC 5.14 (H) 04/08/2018 1420   HGB 15.5 (H) 04/08/2018 1420   HGB 14.0 05/01/2017 1009   HCT 47.3 (H) 04/08/2018 1420   HCT 43.3 05/01/2017 1009   PLT 318 04/08/2018 1420   MCV 92.0 04/08/2018 1420   MCV 93 05/01/2017 1009   MCH 30.2 04/08/2018 1420   MCHC 32.8 04/08/2018 1420   RDW 13.7 04/08/2018 1420   RDW 13.7 05/01/2017 1009   LYMPHSABS 1.5 05/01/2017 1009   MONOABS 0.7 01/09/2012 0956   EOSABS 0.1 05/01/2017 1009   BASOSABS 0.0 05/01/2017 1009   Iron/TIBC/Ferritin/ %Sat No results found for: IRON, TIBC, FERRITIN, IRONPCTSAT Lipid Panel     Component Value Date/Time   CHOL 172 10/03/2017 0821   TRIG 73 10/03/2017 0821   HDL 53 10/03/2017 0821   CHOLHDL  3.4 01/09/2012 0956   VLDL 14 01/09/2012 0956   LDLCALC 104 (H) 10/03/2017 0821   Hepatic Function Panel     Component Value Date/Time   PROT 6.8 05/01/2017 1009   ALBUMIN 4.2 05/01/2017 1009   AST 15 05/01/2017 1009   ALT 14 05/01/2017 1009   ALKPHOS 61 05/01/2017 1009   BILITOT 0.3 05/01/2017 1009      Component Value Date/Time   TSH 1.640 05/01/2017 1009   TSH 1.850 01/09/2012 0956      OBESITY BEHAVIORAL INTERVENTION VISIT  Today's visit was # 22   Starting weight: 264 lbs Starting date: 05/01/17 Today's weight : 273 lbs  Today's date: 07/29/2018 Total lbs lost to date: 0    ASK: We discussed the diagnosis of obesity with Cynthia [name] today and Cynthia [name] agreed to give [place] permission to discuss obesity behavioral modification therapy today.  ASSESS: Cynthia [name] has the diagnosis of obesity and her BMI today is 64.92 Corenna is in the action stage of change   ADVISE: Cynthia [name] was educated on the multiple health risks of obesity as well as the benefit of weight loss to improve her health. She was advised of the need for long term treatment and the importance of lifestyle modifications to improve her current health and to decrease her risk of future health  problems.  AGREE: Multiple dietary modification options and treatment options were discussed and  Cynthia [name] agreed to follow the recommendations documented in the above note.  ARRANGE: Albertina was educated on the importance of frequent visits to treat obesity as outlined per CMS and USPSTF guidelines and agreed to schedule her next follow up appointment today.  I, Burt Knack, am acting as transcriptionist for Debbra Riding, MD  I have reviewed the above documentation for accuracy and completeness, and I agree with the above. - Debbra Riding, MD

## 2018-07-31 DIAGNOSIS — M25562 Pain in left knee: Secondary | ICD-10-CM | POA: Diagnosis not present

## 2018-07-31 DIAGNOSIS — M25561 Pain in right knee: Secondary | ICD-10-CM | POA: Diagnosis not present

## 2018-07-31 DIAGNOSIS — M545 Low back pain: Secondary | ICD-10-CM | POA: Diagnosis not present

## 2018-08-18 DIAGNOSIS — M25561 Pain in right knee: Secondary | ICD-10-CM | POA: Diagnosis not present

## 2018-08-18 DIAGNOSIS — M545 Low back pain: Secondary | ICD-10-CM | POA: Diagnosis not present

## 2018-08-18 DIAGNOSIS — M25562 Pain in left knee: Secondary | ICD-10-CM | POA: Diagnosis not present

## 2018-08-20 DIAGNOSIS — M25561 Pain in right knee: Secondary | ICD-10-CM | POA: Diagnosis not present

## 2018-08-20 DIAGNOSIS — M545 Low back pain: Secondary | ICD-10-CM | POA: Diagnosis not present

## 2018-08-20 DIAGNOSIS — M25562 Pain in left knee: Secondary | ICD-10-CM | POA: Diagnosis not present

## 2018-08-21 ENCOUNTER — Encounter (INDEPENDENT_AMBULATORY_CARE_PROVIDER_SITE_OTHER): Payer: Self-pay

## 2018-08-21 ENCOUNTER — Ambulatory Visit (INDEPENDENT_AMBULATORY_CARE_PROVIDER_SITE_OTHER): Payer: BLUE CROSS/BLUE SHIELD | Admitting: Family Medicine

## 2018-08-25 DIAGNOSIS — M25562 Pain in left knee: Secondary | ICD-10-CM | POA: Diagnosis not present

## 2018-08-25 DIAGNOSIS — M545 Low back pain: Secondary | ICD-10-CM | POA: Diagnosis not present

## 2018-08-25 DIAGNOSIS — M25561 Pain in right knee: Secondary | ICD-10-CM | POA: Diagnosis not present

## 2018-08-27 DIAGNOSIS — M25562 Pain in left knee: Secondary | ICD-10-CM | POA: Diagnosis not present

## 2018-08-27 DIAGNOSIS — M545 Low back pain: Secondary | ICD-10-CM | POA: Diagnosis not present

## 2018-08-27 DIAGNOSIS — M25561 Pain in right knee: Secondary | ICD-10-CM | POA: Diagnosis not present

## 2018-08-29 DIAGNOSIS — M1711 Unilateral primary osteoarthritis, right knee: Secondary | ICD-10-CM | POA: Diagnosis not present

## 2018-09-01 ENCOUNTER — Encounter: Payer: Self-pay | Admitting: Allergy and Immunology

## 2018-09-01 ENCOUNTER — Ambulatory Visit: Payer: BLUE CROSS/BLUE SHIELD | Admitting: Allergy and Immunology

## 2018-09-01 VITALS — BP 108/78 | HR 86 | Resp 16

## 2018-09-01 DIAGNOSIS — H1013 Acute atopic conjunctivitis, bilateral: Secondary | ICD-10-CM

## 2018-09-01 DIAGNOSIS — J3089 Other allergic rhinitis: Secondary | ICD-10-CM | POA: Diagnosis not present

## 2018-09-01 DIAGNOSIS — J4540 Moderate persistent asthma, uncomplicated: Secondary | ICD-10-CM

## 2018-09-01 NOTE — Patient Instructions (Addendum)
Moderate persistent asthma  For now, continue Brio Ellipta 200-25 g, 1 inhalation daily, montelukast 10 mg daily at bedtime, and albuterol HFA, 1-2 inhalations every 4-6 hours as needed.  Subjective and objective measures of pulmonary function will be followed and the treatment plan will be adjusted accordingly.  Seasonal and perennial allergic rhinitis  Continue appropriate allergen avoidance measures, montelukast 10 mg daily at bedtime, and Qnasl 1-2 times daily as needed.  Nasal saline lavage (NeilMed) has been recommended as needed and prior to medicated nasal sprays along with instructions for proper administration.  To avoid diminishing benefit with daily use (tachyphylaxis) of second generation antihistamine, consider alternating every few months between fexofenadine (Allegra) and levocetirizine (Xyzal).  If allergen avoidance measures and medications fail to adequately relieve symptoms, aeroallergen immunotherapy will be considered.   Return in about 4 months (around 12/31/2018), or if symptoms worsen or fail to improve.

## 2018-09-01 NOTE — Progress Notes (Signed)
Follow-up Note  RE: Cynthia MaffucciSarah C [name] MRN: [id number] DOB: 01-Jan-1986 Date of Office Visit: 09/01/2018  Primary care provider: Clayborn Heronankins, Victoria R, MD Referring provider: Clayborn Heronankins, Victoria R, MD  History of present illness: Cynthia HoveSarah [name] is a 33 y.o. female with persistent asthma and allergic rhinoconjunctivitis presenting today for follow-up.  She was previously seen in this clinic for her initial evaluation on October 2019.  She reports that her nasal allergy symptoms are well controlled with levocetirizine daily and Qnasl as needed.  Her asthma has been well controlled with Brio Ellipta 200-25 g, 1 inhalation daily, and montelukast 10 mg daily at bedtime.  She reports that she recently experienced some rare mild wheezing in association with symptoms consistent with upper respiratory tract infection.  Assessment and plan: Moderate persistent asthma  For now, continue Brio Ellipta 200-25 g, 1 inhalation daily, montelukast 10 mg daily at bedtime, and albuterol HFA, 1-2 inhalations every 4-6 hours as needed.  Subjective and objective measures of pulmonary function will be followed and the treatment plan will be adjusted accordingly.  Seasonal and perennial allergic rhinitis  Continue appropriate allergen avoidance measures, montelukast 10 mg daily at bedtime, and Qnasl 1-2 times daily as needed.  Nasal saline lavage (NeilMed) has been recommended as needed and prior to medicated nasal sprays along with instructions for proper administration.  To avoid diminishing benefit with daily use (tachyphylaxis) of second generation antihistamine, consider alternating every few months between fexofenadine (Allegra) and levocetirizine (Xyzal).  If allergen avoidance measures and medications fail to adequately relieve symptoms, aeroallergen immunotherapy will be considered.   Diagnostics: Spirometry reveals an FVC of 2.92 L (83% predicted) and an FEV1 of 2.38 L (81% predicted) with an FEV1 ratio  of 99%.  Please see scanned spirometry results for details.    Physical examination: Blood pressure 108/78, pulse 86, resp. rate 16, SpO2 95 %.  General: Alert, interactive, in no acute distress. HEENT: TMs pearly gray, turbinates moderately edematous without discharge, post-pharynx mildly erythematous. Neck: Supple without lymphadenopathy. Lungs: Clear to auscultation without wheezing, rhonchi or rales. CV: Normal S1, S2 without murmurs. Skin: Warm and dry, without lesions or rashes.  The following portions of the patient's history were reviewed and updated as appropriate: allergies, current medications, past family history, past medical history, past social history, past surgical history and problem list.  Allergies as of 09/01/2018      Reactions   Aquacel [carboxymethylcellulose] Itching, Rash, Other (See Comments)   Caused blisters   Penicillins    Unknown reaction Has patient had a PCN reaction causing immediate rash, facial/tongue/throat swelling, SOB or lightheadedness with hypotension: Unknown Has patient had a PCN reaction causing severe rash involving mucus membranes or skin necrosis: Unknown Has patient had a PCN reaction that required hospitalization: No Has patient had a PCN reaction occurring within the last 10 years: No If all of the above answers are "NO", then may proceed with Cephalosporin use.   Adhesive [tape] Rash   Prefers to use paper tape   Latex Rash   Oxycodone Itching, Rash      Medication List       Accurate as of September 01, 2018  9:13 PM. Always use your most recent med list.        acetaminophen 500 MG tablet Commonly known as:  TYLENOL Take 1,000 mg by mouth 3 (three) times daily as needed for moderate pain.   albuterol 108 (90 Base) MCG/ACT inhaler Commonly known as:  PROAIR HFA Inhale 1-2 puffs  into the lungs every 6 (six) hours as needed for wheezing or shortness of breath.   alum hydroxide-mag trisilicate 80-20 MG Chew chewable  tablet Commonly known as:  GAVISCON Chew 1-2 tablets by mouth daily as needed for indigestion or heartburn.   FLUoxetine 10 MG capsule Commonly known as:  PROZAC Take 1 capsule (10 mg total) by mouth as directed. Take three tabs by mouth daily   fluticasone furoate-vilanterol 200-25 MCG/INH Aepb Commonly known as:  BREO ELLIPTA Inhale 1 puff into the lungs daily.   HYDROcodone-acetaminophen 10-325 MG tablet Commonly known as:  NORCO Take 1 tablet by mouth every 6 (six) hours as needed.   hyoscyamine 0.125 MG SL tablet Commonly known as:  LEVSIN SL Take 0.125 mg by mouth 3 (three) times daily with meals.   levocetirizine 5 MG tablet Commonly known as:  XYZAL Take 1 tablet (5 mg total) by mouth every evening.   meloxicam 15 MG tablet Commonly known as:  MOBIC   methocarbamol 500 MG tablet Commonly known as:  ROBAXIN Take 500 mg by mouth 2 (two) times daily as needed for muscle spasms.   montelukast 10 MG tablet Commonly known as:  SINGULAIR Take 1 tablet (10 mg total) by mouth at bedtime.   norethindrone-ethinyl estradiol 1-20 MG-MCG tablet Commonly known as:  MICROGESTIN FE 1/20 Take 1 tablet by mouth daily.   Olopatadine HCl 0.7 % Soln Commonly known as:  PAZEO Place 1 drop into both eyes 1 day or 1 dose.   olopatadine 0.1 % ophthalmic solution Commonly known as:  PATANOL Place 1 drop into both eyes daily as needed for allergies.   QNASL 80 MCG/ACT Aers Generic drug:  Beclomethasone Dipropionate Place 1 spray into both nostrils 2 (two) times daily as needed (allergies).   traMADol 50 MG tablet Commonly known as:  ULTRAM Take by mouth every 6 (six) hours as needed.   traZODone 100 MG tablet Commonly known as:  DESYREL Take 100 mg by mouth at bedtime.   Vitamin D (Ergocalciferol) 1.25 MG (50000 UT) Caps capsule Commonly known as:  DRISDOL Take 1 capsule (50,000 Units total) by mouth every 7 (seven) days.       Allergies  Allergen Reactions  . Aquacel  [Carboxymethylcellulose] Itching, Rash and Other (See Comments)    Caused blisters  . Penicillins     Unknown reaction Has patient had a PCN reaction causing immediate rash, facial/tongue/throat swelling, SOB or lightheadedness with hypotension: Unknown Has patient had a PCN reaction causing severe rash involving mucus membranes or skin necrosis: Unknown Has patient had a PCN reaction that required hospitalization: No Has patient had a PCN reaction occurring within the last 10 years: No If all of the above answers are "NO", then may proceed with Cephalosporin use.   . Adhesive [Tape] Rash    Prefers to use paper tape  . Latex Rash  . Oxycodone Itching and Rash   Review of systems: Review of systems negative except as noted in HPI / PMHx or noted below: Constitutional: Negative.  HENT: Negative.   Eyes: Negative.  Respiratory: Negative.   Cardiovascular: Negative.  Gastrointestinal: Negative.  Genitourinary: Negative.  Musculoskeletal: Negative.  Neurological: Negative.  Endo/Heme/Allergies: Negative.  Cutaneous: Negative.  Past Medical History:  Diagnosis Date  . ADHD (attention deficit hyperactivity disorder)   . Allergic rhinitis   . Anxiety   . Asthma   . Back injury    COMPETITIVE INJURY IN HIGH SCHOOL  . Back pain   . Depression   .  Fatty liver   . Fibromyalgia   . Gallbladder problem   . GERD (gastroesophageal reflux disease)   . IBS (irritable bowel syndrome)   . Insulin resistance   . Joint pain   . Migraines   . OA (osteoarthritis) of knee   . Obesity   . Pneumonia   . PONV (postoperative nausea and vomiting)   . Seasonal allergies   . Vitamin D deficiency     Family History  Problem Relation Age of Onset  . Multiple sclerosis Mother   . Diabetes Mother   . Hyperlipidemia Mother   . Thyroid disease Mother   . Depression Mother   . Anxiety disorder Mother   . Obesity Mother   . Allergic rhinitis Mother   . Hypertension Father   . Diabetes  Father   . Hyperlipidemia Father   . Obesity Father   . Allergic rhinitis Father   . Diabetes Maternal Grandfather   . Cancer Maternal Grandfather        prostate  . Heart disease Maternal Grandfather   . Heart disease Paternal Grandfather   . Allergic rhinitis Paternal Aunt   . Allergic rhinitis Maternal Grandmother   . Asthma Neg Hx   . Eczema Neg Hx   . Urticaria Neg Hx     Social History   Socioeconomic History  . Marital status: Single    Spouse name: Not on file  . Number of children: Not on file  . Years of education: Not on file  . Highest education level: Not on file  Occupational History  . Occupation: Asst. Social research officer, governmenttore manager, Human resources officer[employer]  Social Needs  . Financial resource strain: Not on file  . Food insecurity:    Worry: Not on file    Inability: Not on file  . Transportation needs:    Medical: Not on file    Non-medical: Not on file  Tobacco Use  . Smoking status: Never Smoker  . Smokeless tobacco: Never Used  Substance and Sexual Activity  . Alcohol use: Yes    Comment: social  . Drug use: No  . Sexual activity: Not Currently    Birth control/protection: Pill  Lifestyle  . Physical activity:    Days per week: Not on file    Minutes per session: Not on file  . Stress: Not on file  Relationships  . Social connections:    Talks on phone: Not on file    Gets together: Not on file    Attends religious service: Not on file    Active member of club or organization: Not on file    Attends meetings of clubs or organizations: Not on file    Relationship status: Not on file  . Intimate partner violence:    Fear of current or ex partner: Not on file    Emotionally abused: Not on file    Physically abused: Not on file    Forced sexual activity: Not on file  Other Topics Concern  . Not on file  Social History Narrative  . Not on file    I appreciate the opportunity to take part in [place]'s care. Please do not hesitate to contact me with  questions.  Sincerely,   R. Jorene Guestarter Telesha Deguzman, MD

## 2018-09-01 NOTE — Assessment & Plan Note (Signed)
   Continue appropriate allergen avoidance measures, montelukast 10 mg daily at bedtime, and Qnasl 1-2 times daily as needed.  Nasal saline lavage (NeilMed) has been recommended as needed and prior to medicated nasal sprays along with instructions for proper administration.  To avoid diminishing benefit with daily use (tachyphylaxis) of second generation antihistamine, consider alternating every few months between fexofenadine (Allegra) and levocetirizine (Xyzal).  If allergen avoidance measures and medications fail to adequately relieve symptoms, aeroallergen immunotherapy will be considered.

## 2018-09-01 NOTE — Assessment & Plan Note (Signed)
   For now, continue Brio Ellipta 200-25 g, 1 inhalation daily, montelukast 10 mg daily at bedtime, and albuterol HFA, 1-2 inhalations every 4-6 hours as needed.  Subjective and objective measures of pulmonary function will be followed and the treatment plan will be adjusted accordingly.

## 2018-09-02 ENCOUNTER — Encounter (INDEPENDENT_AMBULATORY_CARE_PROVIDER_SITE_OTHER): Payer: Self-pay | Admitting: Family Medicine

## 2018-09-02 ENCOUNTER — Ambulatory Visit (INDEPENDENT_AMBULATORY_CARE_PROVIDER_SITE_OTHER): Payer: BLUE CROSS/BLUE SHIELD | Admitting: Family Medicine

## 2018-09-02 VITALS — BP 105/73 | HR 107 | Temp 97.9°F | Ht 62.0 in | Wt 274.0 lb

## 2018-09-02 DIAGNOSIS — F411 Generalized anxiety disorder: Secondary | ICD-10-CM

## 2018-09-02 DIAGNOSIS — Z6841 Body Mass Index (BMI) 40.0 and over, adult: Secondary | ICD-10-CM

## 2018-09-02 DIAGNOSIS — Z9189 Other specified personal risk factors, not elsewhere classified: Secondary | ICD-10-CM

## 2018-09-02 DIAGNOSIS — E559 Vitamin D deficiency, unspecified: Secondary | ICD-10-CM | POA: Diagnosis not present

## 2018-09-02 MED ORDER — FLUOXETINE HCL 10 MG PO CAPS: 10.0000 mg | ORAL_CAPSULE | ORAL | 0 refills | Status: DC

## 2018-09-02 MED ORDER — VITAMIN D (ERGOCALCIFEROL) 1.25 MG (50000 UNIT) PO CAPS: 50000.0000 [IU] | ORAL_CAPSULE | ORAL | 0 refills | Status: DC

## 2018-09-02 NOTE — Addendum Note (Signed)
Addended by: [name] Fritz I on: 09/02/2018 07:22 AM   Modules accepted: Orders

## 2018-09-03 NOTE — Progress Notes (Signed)
Office: [phone]  /  Fax: [phone]   HPI:   Chief Complaint: OBESITY Cynthia [name] is here to discuss her progress with her obesity treatment plan. She is on the Category 2 plan or follow the Pescatarian eating plan and is following her eating plan approximately 80 % of the time. She states she is doing aquatic therapy for 120 minutes 2 times per week. Cynthia [name] [place]. She is getting protein in and staying within calories. She denies hunger at breakfast and force feeding herself to get food in. Her weight is 274 lb (124.3 kg) today and has gained 1 pound since her last visit. She has lost 0 lbs since starting treatment with us.  Vitamin D Deficiency Cynthia [name] has a diagnosis of vitamin D deficiency. She is currently taking prescription Vit D. She notes fatigue and denies nausea, vomiting or muscle weakness.  At risk for osteopenia and osteoporosis Cynthia [name] is at higher risk of osteopenia and osteoporosis due to vitamin D deficiency.   Generalized Anxiety Disorder Cynthia [name] note her symptoms are better controlled on Prozac. She recently found out her mother had meningioma.   ASSESSMENT AND PLAN:  Vitamin D deficiency - Plan: Vitamin D, Ergocalciferol, (DRISDOL) 1.25 MG (50000 UT) CAPS capsule  GAD (generalized anxiety disorder) - Plan: FLUoxetine (PROZAC) 10 MG capsule  At risk for osteoporosis  Class 3 severe obesity with serious comorbidity and body mass index (BMI) of 50.0 to 59.9 in adult, unspecified obesity type (HCC)  PLAN:  Vitamin D Deficiency Cynthia [name] was informed that low vitamin D levels contributes to fatigue and are associated with obesity, breast, and colon cancer. Cynthia [name] agrees to continue taking prescription Vit D @50 ,000 IU every week #4 and we will refill for 1 month. She will follow up for routine testing of vitamin D, at least 2-3 times per year. She was informed of the risk of over-replacement of vitamin D and agrees to not increase her dose unless  she discusses this with us first. Cynthia [name] agrees to follow up with our clinic in 2 weeks.  At risk for osteopenia and osteoporosis Cynthia [name] was given extended (15 minutes) osteoporosis prevention counseling today. Cynthia [name] is at risk for osteopenia and osteoporsis due to her vitamin D deficiency. She was encouraged to take her vitamin D and follow her higher calcium diet and increase strengthening exercise to help strengthen her bones and decrease her risk of osteopenia and osteoporosis.  Generalized Anxiety Disorder Cynthia [name] agrees to continue taking Prozac 30 mg PO q daily #90 and we will refill for 1 month. Cynthia [name] agrees to follow up with our clinic in 2 weeks.  Obesity Cynthia [name] is currently in the action stage of change. As such, her goal is to continue with weight loss efforts She has agreed to keep a food journal with 1400 calories and 85-90 grams of protein daily Cynthia [name] has been instructed to work up to a goal of 150 minutes of combined cardio and strengthening exercise per week for weight loss and overall health benefits. We discussed the following Behavioral Modification Strategies today: increasing lean protein intake, increasing vegetables, work on meal planning and easy cooking plans, better snacking choices, and planning for success   Cynthia [name] has agreed to follow up with our clinic in 2 weeks. She was informed of the importance of frequent follow up visits to maximize her success with intensive lifestyle modifications for her multiple health conditions.  ALLERGIES: Allergies  Allergen Reactions  . Aquacel [Carboxymethylcellulose] Itching, Rash and  Other (See Comments)    Caused blisters  . Penicillins     Unknown reaction Has patient had a PCN reaction causing immediate rash, facial/tongue/throat swelling, SOB or lightheadedness with hypotension: Unknown Has patient had a PCN reaction causing severe rash involving mucus membranes or skin necrosis: Unknown Has patient had a PCN reaction that  required hospitalization: No Has patient had a PCN reaction occurring within the last 10 years: No If all of the above answers are "NO", then may proceed with Cephalosporin use.   . Adhesive [Tape] Rash    Prefers to use paper tape  . Latex Rash  . Oxycodone Itching and Rash    MEDICATIONS: Current Outpatient Medications on File Prior to Visit  Medication Sig Dispense Refill  . acetaminophen (TYLENOL) 500 MG tablet Take 1,000 mg by mouth 3 (three) times daily as needed for moderate pain.     Marland Kitchen albuterol (PROAIR HFA) 108 (90 Base) MCG/ACT inhaler Inhale 1-2 puffs into the lungs every 6 (six) hours as needed for wheezing or shortness of breath. 18 g 1  . alum hydroxide-mag trisilicate (GAVISCON) 80-20 MG CHEW chewable tablet Chew 1-2 tablets by mouth daily as needed for indigestion or heartburn.    . fluticasone furoate-vilanterol (BREO ELLIPTA) 200-25 MCG/INH AEPB Inhale 1 puff into the lungs daily. 60 each 3  . HYDROcodone-acetaminophen (NORCO) 10-325 MG tablet Take 1 tablet by mouth every 6 (six) hours as needed.    . hyoscyamine (LEVSIN SL) 0.125 MG SL tablet Take 0.125 mg by mouth 3 (three) times daily with meals.   0  . levocetirizine (XYZAL) 5 MG tablet Take 1 tablet (5 mg total) by mouth every evening. 30 tablet 5  . meloxicam (MOBIC) 15 MG tablet     . methocarbamol (ROBAXIN) 500 MG tablet Take 500 mg by mouth 2 (two) times daily as needed for muscle spasms.     . montelukast (SINGULAIR) 10 MG tablet Take 1 tablet (10 mg total) by mouth at bedtime. 30 tablet 5  . norethindrone-ethinyl estradiol (MICROGESTIN FE 1/20) 1-20 MG-MCG tablet Take 1 tablet by mouth daily. (Patient taking differently: Take 1 tablet by mouth at bedtime. ) 3 Package 4  . olopatadine (PATANOL) 0.1 % ophthalmic solution Place 1 drop into both eyes daily as needed for allergies.     . Olopatadine HCl (PAZEO) 0.7 % SOLN Place 1 drop into both eyes 1 day or 1 dose. (Patient not taking: Reported on 09/01/2018) 1 Bottle  5  . QNASL 80 MCG/ACT AERS Place 1 spray into both nostrils 2 (two) times daily as needed (allergies). 10.6 g 3  . traMADol (ULTRAM) 50 MG tablet Take by mouth every 6 (six) hours as needed.    . traZODone (DESYREL) 100 MG tablet Take 100 mg by mouth at bedtime.      No current facility-administered medications on file prior to visit.     PAST MEDICAL HISTORY: Past Medical History:  Diagnosis Date  . ADHD (attention deficit hyperactivity disorder)   . Allergic rhinitis   . Anxiety   . Asthma   . Back injury    COMPETITIVE INJURY IN HIGH SCHOOL  . Back pain   . Depression   . Fatty liver   . Fibromyalgia   . Gallbladder problem   . GERD (gastroesophageal reflux disease)   . IBS (irritable bowel syndrome)   . Insulin resistance   . Joint pain   . Migraines   . OA (osteoarthritis) of knee   .  Obesity   . Pneumonia   . PONV (postoperative nausea and vomiting)   . Seasonal allergies   . Vitamin D deficiency     PAST SURGICAL HISTORY: Past Surgical History:  Procedure Laterality Date  . CHOLECYSTECTOMY    . DG DILATION URETERS    . KNEE ARTHROSCOPY Left   . KNEE ARTHROSCOPY WITH LATERAL MENISECTOMY Right 04/11/2018   Procedure: RIGHT KNEE ARTHROSCOPY WITH PARTIAL MENISECTOMY CHONDROPLASTY;  Surgeon: Eugenia Mcalpine, MD;  Location: [facility];  Service: Orthopedics;  Laterality: Right;  . MOUTH SURGERY    . SHOULDER SURGERY  2002   RIGHT  . WISDOM TOOTH EXTRACTION    . WRIST ARTHROSCOPY     Right    SOCIAL HISTORY: Social History   Tobacco Use  . Smoking status: Never Smoker  . Smokeless tobacco: Never Used  Substance Use Topics  . Alcohol use: Yes    Comment: social  . Drug use: No    FAMILY HISTORY: Family History  Problem Relation Age of Onset  . Multiple sclerosis Mother   . Diabetes Mother   . Hyperlipidemia Mother   . Thyroid disease Mother   . Depression Mother   . Anxiety disorder Mother   . Obesity Mother   . Allergic rhinitis Mother   .  Hypertension Father   . Diabetes Father   . Hyperlipidemia Father   . Obesity Father   . Allergic rhinitis Father   . Diabetes Maternal Grandfather   . Cancer Maternal Grandfather        prostate  . Heart disease Maternal Grandfather   . Heart disease Paternal Grandfather   . Allergic rhinitis Paternal Aunt   . Allergic rhinitis Maternal Grandmother   . Asthma Neg Hx   . Eczema Neg Hx   . Urticaria Neg Hx     ROS: Review of Systems  Constitutional: Positive for malaise/fatigue. Negative for weight loss.  Gastrointestinal: Negative for nausea and vomiting.  Musculoskeletal:       Negative muscle weakness  Psychiatric/Behavioral:       + Anxiety    PHYSICAL EXAM: Blood pressure 105/73, pulse (!) 107, temperature 97.9 F (36.6 C), temperature source Oral, height 5\' 2"  (1.575 m), weight 274 lb (124.3 kg), SpO2 97 %. Body mass index is 50.12 kg/m. Physical Exam Vitals signs reviewed.  Constitutional:      Appearance: Normal appearance. She is obese.  Cardiovascular:     Rate and Rhythm: Normal rate.     Pulses: Normal pulses.  Pulmonary:     Effort: Pulmonary effort is normal.     Breath sounds: Normal breath sounds.  Musculoskeletal: Normal range of motion.  Skin:    General: Skin is warm and dry.  Neurological:     Mental Status: She is alert and oriented to person, place, and time.  Psychiatric:        Mood and Affect: Mood normal.        Behavior: Behavior normal.     RECENT LABS AND TESTS: BMET    Component Value Date/Time   NA 142 04/08/2018 1420   NA 139 05/01/2017 1009   K 4.6 04/08/2018 1420   CL 107 04/08/2018 1420   CO2 25 04/08/2018 1420   GLUCOSE 87 04/08/2018 1420   BUN 12 04/08/2018 1420   BUN 10 05/01/2017 1009   CREATININE 0.77 04/08/2018 1420   CALCIUM 9.1 04/08/2018 1420   GFRNONAA >60 04/08/2018 1420   GFRAA >60 04/08/2018 1420   Lab Results  Component Value Date   HGBA1C 5.5 01/16/2018   HGBA1C 5.4 10/03/2017   HGBA1C 5.3  05/01/2017   Lab Results  Component Value Date   INSULIN 12.9 01/16/2018   INSULIN 21.7 10/03/2017   INSULIN 11.1 05/01/2017   CBC    Component Value Date/Time   WBC 10.5 04/08/2018 1420   RBC 5.14 (H) 04/08/2018 1420   HGB 15.5 (H) 04/08/2018 1420   HGB 14.0 05/01/2017 1009   HCT 47.3 (H) 04/08/2018 1420   HCT 43.3 05/01/2017 1009   PLT 318 04/08/2018 1420   MCV 92.0 04/08/2018 1420   MCV 93 05/01/2017 1009   MCH 30.2 04/08/2018 1420   MCHC 32.8 04/08/2018 1420   RDW 13.7 04/08/2018 1420   RDW 13.7 05/01/2017 1009   LYMPHSABS 1.5 05/01/2017 1009   MONOABS 0.7 01/09/2012 0956   EOSABS 0.1 05/01/2017 1009   BASOSABS 0.0 05/01/2017 1009   Iron/TIBC/Ferritin/ %Sat No results found for: IRON, TIBC, FERRITIN, IRONPCTSAT Lipid Panel     Component Value Date/Time   CHOL 172 10/03/2017 0821   TRIG 73 10/03/2017 0821   HDL 53 10/03/2017 0821   CHOLHDL 3.4 01/09/2012 0956   VLDL 14 01/09/2012 0956   LDLCALC 104 (H) 10/03/2017 0821   Hepatic Function Panel     Component Value Date/Time   PROT 6.8 05/01/2017 1009   ALBUMIN 4.2 05/01/2017 1009   AST 15 05/01/2017 1009   ALT 14 05/01/2017 1009   ALKPHOS 61 05/01/2017 1009   BILITOT 0.3 05/01/2017 1009      Component Value Date/Time   TSH 1.640 05/01/2017 1009   TSH 1.850 01/09/2012 0956      OBESITY BEHAVIORAL INTERVENTION VISIT  Today's visit was # 23   Starting weight: 264 lbs Starting date: 05/01/17 Today's weight :274 lbs Today's date: 09/02/2018 Total lbs lost to date: 0    ASK: We discussed the diagnosis of obesity with Cynthia [name] today and Maythe agreed to give [place] permission to discuss obesity behavioral modification therapy today.  ASSESS: Cynthia [name] has the diagnosis of obesity and her BMI today is 50.1 Cynthia [name] is in the action stage of change   ADVISE: Cynthia [name] was educated on the multiple health risks of obesity as well as the benefit of weight loss to improve her health. She was advised of the  need for long term treatment and the importance of lifestyle modifications to improve her current health and to decrease her risk of future health problems.  AGREE: Multiple dietary modification options and treatment options were discussed and  Cynthia [name] agreed to follow the recommendations documented in the above note.  ARRANGE: Cynthia [name] was educated on the importance of frequent visits to treat obesity as outlined per CMS and USPSTF guidelines and agreed to schedule her next follow up appointment today.  I, Burt Knack, am acting as transcriptionist for Debbra Riding, MD  I have reviewed the above documentation for accuracy and completeness, and I agree with the above. - Debbra Riding, MD

## 2018-09-08 DIAGNOSIS — M545 Low back pain: Secondary | ICD-10-CM | POA: Diagnosis not present

## 2018-09-08 DIAGNOSIS — M25561 Pain in right knee: Secondary | ICD-10-CM | POA: Diagnosis not present

## 2018-09-08 DIAGNOSIS — M25562 Pain in left knee: Secondary | ICD-10-CM | POA: Diagnosis not present

## 2018-09-10 DIAGNOSIS — M545 Low back pain: Secondary | ICD-10-CM | POA: Diagnosis not present

## 2018-09-10 DIAGNOSIS — M25561 Pain in right knee: Secondary | ICD-10-CM | POA: Diagnosis not present

## 2018-09-10 DIAGNOSIS — M25562 Pain in left knee: Secondary | ICD-10-CM | POA: Diagnosis not present

## 2018-09-15 DIAGNOSIS — M25561 Pain in right knee: Secondary | ICD-10-CM | POA: Diagnosis not present

## 2018-09-15 DIAGNOSIS — M545 Low back pain: Secondary | ICD-10-CM | POA: Diagnosis not present

## 2018-09-15 DIAGNOSIS — M25562 Pain in left knee: Secondary | ICD-10-CM | POA: Diagnosis not present

## 2018-09-17 DIAGNOSIS — M545 Low back pain: Secondary | ICD-10-CM | POA: Diagnosis not present

## 2018-09-17 DIAGNOSIS — M25561 Pain in right knee: Secondary | ICD-10-CM | POA: Diagnosis not present

## 2018-09-17 DIAGNOSIS — M25562 Pain in left knee: Secondary | ICD-10-CM | POA: Diagnosis not present

## 2018-09-18 ENCOUNTER — Ambulatory Visit (INDEPENDENT_AMBULATORY_CARE_PROVIDER_SITE_OTHER): Payer: BLUE CROSS/BLUE SHIELD | Admitting: Family Medicine

## 2018-09-22 DIAGNOSIS — M25561 Pain in right knee: Secondary | ICD-10-CM | POA: Diagnosis not present

## 2018-09-22 DIAGNOSIS — M545 Low back pain: Secondary | ICD-10-CM | POA: Diagnosis not present

## 2018-09-22 DIAGNOSIS — M25562 Pain in left knee: Secondary | ICD-10-CM | POA: Diagnosis not present

## 2018-09-24 DIAGNOSIS — M545 Low back pain: Secondary | ICD-10-CM | POA: Diagnosis not present

## 2018-09-24 DIAGNOSIS — M25562 Pain in left knee: Secondary | ICD-10-CM | POA: Diagnosis not present

## 2018-09-24 DIAGNOSIS — M25561 Pain in right knee: Secondary | ICD-10-CM | POA: Diagnosis not present

## 2018-09-30 ENCOUNTER — Encounter (INDEPENDENT_AMBULATORY_CARE_PROVIDER_SITE_OTHER): Payer: Self-pay | Admitting: Family Medicine

## 2018-09-30 ENCOUNTER — Ambulatory Visit (INDEPENDENT_AMBULATORY_CARE_PROVIDER_SITE_OTHER): Payer: BLUE CROSS/BLUE SHIELD | Admitting: Family Medicine

## 2018-09-30 VITALS — BP 124/81 | HR 101 | Temp 97.9°F | Ht 62.0 in | Wt 273.0 lb

## 2018-09-30 DIAGNOSIS — Z9189 Other specified personal risk factors, not elsewhere classified: Secondary | ICD-10-CM

## 2018-09-30 DIAGNOSIS — F419 Anxiety disorder, unspecified: Secondary | ICD-10-CM

## 2018-09-30 DIAGNOSIS — E559 Vitamin D deficiency, unspecified: Secondary | ICD-10-CM | POA: Diagnosis not present

## 2018-09-30 DIAGNOSIS — Z6841 Body Mass Index (BMI) 40.0 and over, adult: Secondary | ICD-10-CM

## 2018-09-30 MED ORDER — FLUOXETINE HCL 10 MG PO CAPS: 10.0000 mg | ORAL_CAPSULE | ORAL | 0 refills | Status: DC

## 2018-09-30 NOTE — Progress Notes (Signed)
Office: [phone]  /  Fax: [phone]   HPI:   Chief Complaint: OBESITY Cynthia [name] is here to discuss her progress with her obesity treatment plan. She is on the keep a food journal with 1400 calories and 85-90 grams of protein daily and is following her eating plan approximately 85 % of the time. She states she is doing aquatic therapy and barre for 60 minutes 3 times per week. Cynthia [name] has been doing aquatic therapy and barre, and has felt significantly better since incorporating exercise. She is aiming for at least 80 to 85 grams of protein per day (most days >85).  Her weight is 273 lb (123.8 kg) today and has had a weight loss of 1 pound over a period of 4 weeks since her last visit. She has lost 0 lbs since starting treatment with [place].  Anxiety Disorder Cynthia [name] has anxiety disorder. She notes symptoms have significantly improved even with familial drama. She denies side effects of Prozac.  Vitamin D [name] Cynthia [name] has a diagnosis of vitamin D [name]. She is currently taking prescription Vit D. She notes fatigue and denies nausea, vomiting or muscle weakness.  At risk for osteopenia and osteoporosis Cynthia [name] is at higher risk of osteopenia and osteoporosis due to vitamin D [name].   ASSESSMENT AND PLAN:  Anxiety - Plan: FLUoxetine (PROZAC) 10 MG capsule  Vitamin D [name]  At risk for osteoporosis  Class 3 severe obesity with serious comorbidity and body mass index (BMI) of 50.0 to 59.9 in adult, unspecified obesity type Cynthia [name])  PLAN:  Anxiety Cynthia [name] agrees to continue taking Prozac 30 mg 3 capsules PO daily #90 and we will refill for 1 month. Zan agrees to follow up with our clinic in 4 weeks.  Vitamin D [name] Cynthia [name] was informed that low vitamin D levels contributes to fatigue and are associated with obesity, breast, and colon cancer. [name] agrees to continue taking prescription Vit D ,000 IU every week, no refills needed. She will follow up for routine  testing of vitamin D, at least 2-3 times per year. She was informed of the risk of over-replacement of vitamin D and agrees to not increase her dose unless she discusses this with [place] first. Cynthia [name] agrees to follow up with our clinic in 4 weeks.  At risk for osteopenia and osteoporosis Cynthia [name] was given extended (15 minutes) osteoporosis prevention counseling today. Cynthia [name]. She was encouraged to take her vitamin D and follow her higher calcium diet and increase strengthening exercise to help strengthen her bones and decrease her risk of osteopenia and osteoporosis.  Obesity Cynthia [name] is currently in the action stage of change. As such, her goal is to continue with weight loss efforts She has agreed to keep a food journal with 1400 calories and 85-90+ grams of protein daily Cynthia [name] has been instructed to work up to a goal of 150 minutes of combined cardio and strengthening exercise per week or continue current exercise routine and continue journaling for weight loss and overall health benefits. We discussed the following Behavioral Modification Stratagies today: increasing lean protein intake, increasing vegetables, work on meal planning and easy cooking plans, better snacking choices, planning for success, and keep a strict food journal   Cynthia [name] has agreed to follow up with our clinic in 4 weeks. She was informed of the importance of frequent follow up visits to maximize her success with intensive lifestyle modifications for her multiple health conditions.  ALLERGIES: Allergies  Allergen Reactions  . Aquacel [Carboxymethylcellulose] Itching, Rash and Other (See Comments)    Caused blisters  . Penicillins     Unknown reaction Has patient had a PCN reaction causing immediate rash, facial/tongue/throat swelling, SOB or lightheadedness with hypotension: Unknown Has patient had a PCN reaction causing severe rash involving mucus  membranes or skin necrosis: Unknown Has patient had a PCN reaction that required hospitalization: No Has patient had a PCN reaction occurring within the last 10 years: No If all of the above answers are "NO", then may proceed with Cephalosporin use.   . Adhesive [Tape] Rash    Prefers to use paper tape  . Latex Rash  . Oxycodone Itching and Rash    MEDICATIONS: Current Outpatient Medications on File Prior to Visit  Medication Sig Dispense Refill  . acetaminophen (TYLENOL) 500 MG tablet Take 1,000 mg by mouth 3 (three) times daily as needed for moderate pain.     Marland Kitchen. albuterol (PROAIR HFA) 108 (90 Base) MCG/ACT inhaler Inhale 1-2 puffs into the lungs every 6 (six) hours as needed for wheezing or shortness of breath. 18 g 1  . alum hydroxide-mag trisilicate (GAVISCON) 80-20 MG CHEW chewable tablet Chew 1-2 tablets by mouth daily as needed for indigestion or heartburn.    . fluticasone furoate-vilanterol (BREO ELLIPTA) 200-25 MCG/INH AEPB Inhale 1 puff into the lungs daily. 60 each 3  . HYDROcodone-acetaminophen (NORCO) 10-325 MG tablet Take 1 tablet by mouth every 6 (six) hours as needed.    . hyoscyamine (LEVSIN SL) 0.125 MG SL tablet Take 0.125 mg by mouth 3 (three) times daily with meals.   0  . levocetirizine (XYZAL) 5 MG tablet Take 1 tablet (5 mg total) by mouth every evening. 30 tablet 5  . meloxicam (MOBIC) 15 MG tablet     . methocarbamol (ROBAXIN) 500 MG tablet Take 500 mg by mouth 2 (two) times daily as needed for muscle spasms.     . montelukast (SINGULAIR) 10 MG tablet Take 1 tablet (10 mg total) by mouth at bedtime. 30 tablet 5  . norethindrone-ethinyl estradiol (MICROGESTIN FE 1/20) 1-20 MG-MCG tablet Take 1 tablet by mouth daily. (Patient taking differently: Take 1 tablet by mouth at bedtime. ) 3 Package 4  . olopatadine (PATANOL) 0.1 % ophthalmic solution Place 1 drop into both eyes daily as needed for allergies.     . Olopatadine HCl (PAZEO) 0.7 % SOLN Place 1 drop into both  eyes 1 day or 1 dose. 1 Bottle 5  . QNASL 80 MCG/ACT AERS Place 1 spray into both nostrils 2 (two) times daily as needed (allergies). 10.6 g 3  . traMADol (ULTRAM) 50 MG tablet Take by mouth every 6 (six) hours as needed.    . traZODone (DESYREL) 100 MG tablet Take 100 mg by mouth at bedtime.     . Vitamin D, Ergocalciferol, (DRISDOL) 1.25 MG (50000 UT) CAPS capsule Take 1 capsule (50,000 Units total) by mouth every 7 (seven) days. 4 capsule 0   No current facility-administered medications on file prior to visit.     PAST MEDICAL HISTORY: Past Medical History:  Diagnosis Date  . ADHD (attention deficit hyperactivity disorder)   . Allergic rhinitis   . Anxiety   . Asthma   . Back injury    COMPETITIVE INJURY IN HIGH SCHOOL  . Back pain   . Depression   . Fatty liver   . Fibromyalgia   . Gallbladder problem   . GERD (gastroesophageal  reflux disease)   . IBS (irritable bowel syndrome)   . Insulin resistance   . Joint pain   . Migraines   . OA (osteoarthritis) of knee   . Obesity   . Pneumonia   . PONV (postoperative nausea and vomiting)   . Seasonal allergies   . Vitamin D [name]     PAST SURGICAL HISTORY: Past Surgical History:  Procedure Laterality Date  . CHOLECYSTECTOMY    . DG DILATION URETERS    . KNEE ARTHROSCOPY Left   . KNEE ARTHROSCOPY WITH LATERAL MENISECTOMY Right 04/11/2018   Procedure: RIGHT KNEE ARTHROSCOPY WITH PARTIAL MENISECTOMY CHONDROPLASTY;  Surgeon: Eugenia Mcalpine, MD;  Location: [facility];  Service: Orthopedics;  Laterality: Right;  . MOUTH SURGERY    . SHOULDER SURGERY  2002   RIGHT  . WISDOM TOOTH EXTRACTION    . WRIST ARTHROSCOPY     Right    SOCIAL HISTORY: Social History   Tobacco Use  . Smoking status: Never Smoker  . Smokeless tobacco: Never Used  Substance Use Topics  . Alcohol use: Yes    Comment: social  . Drug use: No    FAMILY HISTORY: Family History  Problem Relation Age of Onset  . Multiple sclerosis Mother   .  Diabetes Mother   . Hyperlipidemia Mother   . Thyroid disease Mother   . Depression Mother   . Anxiety disorder Mother   . Obesity Mother   . Allergic rhinitis Mother   . Hypertension Father   . Diabetes Father   . Hyperlipidemia Father   . Obesity Father   . Allergic rhinitis Father   . Diabetes Maternal Grandfather   . Cancer Maternal Grandfather        prostate  . Heart disease Maternal Grandfather   . Heart disease Paternal Grandfather   . Allergic rhinitis Paternal Aunt   . Allergic rhinitis Maternal Grandmother   . Asthma Neg Hx   . Eczema Neg Hx   . Urticaria Neg Hx     ROS: Review of Systems  Constitutional: Positive for malaise/fatigue and weight loss.  Gastrointestinal: Negative for nausea and vomiting.  Musculoskeletal:       Negative muscle weakness  Psychiatric/Behavioral:       + Anxiety    PHYSICAL EXAM: Blood pressure 124/81, pulse (!) 101, temperature 97.9 F (36.6 C), temperature source Oral, height 5\' 2"  (1.575 m), weight 273 lb (123.8 kg), SpO2 98 %. Body mass index is 49.93 kg/m. Physical Exam Vitals signs reviewed.  Constitutional:      Appearance: Normal appearance. She is obese.  Cardiovascular:     Rate and Rhythm: Normal rate.     Pulses: Normal pulses.  Pulmonary:     Effort: Pulmonary effort is normal.     Breath sounds: Normal breath sounds.  Musculoskeletal: Normal range of motion.  Skin:    General: Skin is warm and dry.  Neurological:     Mental Status: She is alert and oriented to person, place, and time.  Psychiatric:        Mood and Affect: Mood normal.        Behavior: Behavior normal.     RECENT LABS AND TESTS: BMET    Component Value Date/Time   NA 142 04/08/2018 1420   NA 139 05/01/2017 1009   K 4.6 04/08/2018 1420   CL 107 04/08/2018 1420   CO2 25 04/08/2018 1420   GLUCOSE 87 04/08/2018 1420   BUN 12 04/08/2018 1420  BUN 10 05/01/2017 1009   CREATININE 0.77 04/08/2018 1420   CALCIUM 9.1 04/08/2018 1420     GFRNONAA >60 04/08/2018 1420   GFRAA >60 04/08/2018 1420   Lab Results  Component Value Date   HGBA1C 5.5 01/16/2018   HGBA1C 5.4 10/03/2017   HGBA1C 5.3 05/01/2017   Lab Results  Component Value Date   INSULIN 12.9 01/16/2018   INSULIN 21.7 10/03/2017   INSULIN 11.1 05/01/2017   CBC    Component Value Date/Time   WBC 10.5 04/08/2018 1420   RBC 5.14 (H) 04/08/2018 1420   HGB 15.5 (H) 04/08/2018 1420   HGB 14.0 05/01/2017 1009   HCT 47.3 (H) 04/08/2018 1420   HCT 43.3 05/01/2017 1009   PLT 318 04/08/2018 1420   MCV 92.0 04/08/2018 1420   MCV 93 05/01/2017 1009   MCH 30.2 04/08/2018 1420   MCHC 32.8 04/08/2018 1420   RDW 13.7 04/08/2018 1420   RDW 13.7 05/01/2017 1009   LYMPHSABS 1.5 05/01/2017 1009   MONOABS 0.7 01/09/2012 0956   EOSABS 0.1 05/01/2017 1009   BASOSABS 0.0 05/01/2017 1009   Iron/TIBC/Ferritin/ %Sat No results found for: IRON, TIBC, FERRITIN, IRONPCTSAT Lipid Panel     Component Value Date/Time   CHOL 172 10/03/2017 0821   TRIG 73 10/03/2017 0821   HDL 53 10/03/2017 0821   CHOLHDL 3.4 01/09/2012 0956   VLDL 14 01/09/2012 0956   LDLCALC 104 (H) 10/03/2017 0821   Hepatic Function Panel     Component Value Date/Time   PROT 6.8 05/01/2017 1009   ALBUMIN 4.2 05/01/2017 1009   AST 15 05/01/2017 1009   ALT 14 05/01/2017 1009   ALKPHOS 61 05/01/2017 1009   BILITOT 0.3 05/01/2017 1009      Component Value Date/Time   TSH 1.640 05/01/2017 1009   TSH 1.850 01/09/2012 0956      OBESITY BEHAVIORAL INTERVENTION VISIT  Today's visit was # 24   Starting weight: 264 lbs Starting date: 05/01/17 Today's weight : 273 lbs Today's date: 09/30/2018 Total lbs lost to date: 0    09/30/2018  Height 5\' 2"  (1.575 m)  Weight 273 lb (123.8 kg)  BMI (Calculated) 49.92  BLOOD PRESSURE - SYSTOLIC 124  BLOOD PRESSURE - DIASTOLIC 81   Body Fat % 53.8 %  Total Body Water (lbs) 92.2 lbs     ASK: We discussed the diagnosis of obesity with Cynthia [name] today and Cynthia [name] agreed to give [place] permission to discuss obesity behavioral modification therapy today.  ASSESS: Cynthia [name] has the diagnosis of obesity and her BMI today is 105.92 Cynthia [name] is in the action stage of change   ADVISE: Cynthia [name] was educated on the multiple health risks of obesity as well as the benefit of weight loss to improve her health. She was advised of the need for long term treatment and the importance of lifestyle modifications to improve her current health and to decrease her risk of future health problems.  AGREE: Multiple dietary modification options and treatment options were discussed and  Rien agreed to follow the recommendations documented in the above note.  ARRANGE: [place] was educated on the importance of frequent visits to treat obesity as outlined per CMS and USPSTF guidelines and agreed to schedule her next follow up appointment today.  I, Burt Knack, am acting as transcriptionist for Debbra Riding, MD  I have reviewed the above documentation for accuracy and completeness, and I agree with the above. - Debbra Riding, MD

## 2018-10-01 DIAGNOSIS — M545 Low back pain: Secondary | ICD-10-CM | POA: Diagnosis not present

## 2018-10-01 DIAGNOSIS — M25562 Pain in left knee: Secondary | ICD-10-CM | POA: Diagnosis not present

## 2018-10-01 DIAGNOSIS — M25561 Pain in right knee: Secondary | ICD-10-CM | POA: Diagnosis not present

## 2018-10-06 DIAGNOSIS — M25561 Pain in right knee: Secondary | ICD-10-CM | POA: Diagnosis not present

## 2018-10-06 DIAGNOSIS — M545 Low back pain: Secondary | ICD-10-CM | POA: Diagnosis not present

## 2018-10-06 DIAGNOSIS — M25562 Pain in left knee: Secondary | ICD-10-CM | POA: Diagnosis not present

## 2018-10-10 ENCOUNTER — Other Ambulatory Visit (INDEPENDENT_AMBULATORY_CARE_PROVIDER_SITE_OTHER): Payer: Self-pay | Admitting: Family Medicine

## 2018-10-10 ENCOUNTER — Other Ambulatory Visit: Payer: Self-pay | Admitting: Allergy and Immunology

## 2018-10-10 DIAGNOSIS — E559 Vitamin D deficiency, unspecified: Secondary | ICD-10-CM

## 2018-10-14 ENCOUNTER — Telehealth: Payer: Self-pay

## 2018-10-14 MED ORDER — FLUNISOLIDE 25 MCG/ACT (0.025%) NA SOLN: 2.0000 | Freq: Every day | NASAL | 0 refills | Status: DC

## 2018-10-14 NOTE — Telephone Encounter (Signed)
Fax from [employer]:  Qnasal not covered Generic therapeutic alternatives for Qnasl are available Fluticasone Propionate  Flunisolide  Triamcinolone Acetonide  Budesonide  Flonase Allergy Relief  Nasacort Allergy 24HR  Pt has not tried and failed any of these alternatives.  Please change to covered alternative:

## 2018-10-14 NOTE — Telephone Encounter (Signed)
   Flunisolide, 2 sp per nostril daily as needed.

## 2018-10-15 ENCOUNTER — Other Ambulatory Visit: Payer: Self-pay | Admitting: *Deleted

## 2018-10-15 ENCOUNTER — Encounter (INDEPENDENT_AMBULATORY_CARE_PROVIDER_SITE_OTHER): Payer: Self-pay

## 2018-10-15 MED ORDER — CARBINOXAMINE MALEATE 4 MG PO TABS: 4.0000 mg | ORAL_TABLET | Freq: Four times a day (QID) | ORAL | 5 refills | Status: DC

## 2018-10-15 NOTE — Telephone Encounter (Signed)
Prescription has been sent in. Called patient and advised. Patient verbalized understanding.  

## 2018-10-15 NOTE — Telephone Encounter (Signed)
Called patient and informed of medication change. Patient verbalized und

## 2018-10-15 NOTE — Telephone Encounter (Signed)
Called patient and informed of change in medication. Pt verbalized understanding. Pt did complain of runny nose and nasal congestion and stated that she has had a slight cough from time to time and has been hoarse. Pt states that she has been taking mucinex and taking Xyzal QD but has not had relief with her symptoms. Pt has not had any issues with SOB, fever, malaise, diarrhea, or severe coughing. Please advise.

## 2018-10-15 NOTE — Telephone Encounter (Signed)
   Try switching from Xyzal to carbinoxamine 4 mg every 6-8 hours as needed (please send in prescription).    Start the flunisolide 2 sprays per nostril daily as needed.    Nasal saline lavage (NeilMed) has been recommended as needed and prior to medicated nasal sprays along with instructions for proper administration.

## 2018-10-27 DIAGNOSIS — M170 Bilateral primary osteoarthritis of knee: Secondary | ICD-10-CM | POA: Diagnosis not present

## 2018-10-27 DIAGNOSIS — M1712 Unilateral primary osteoarthritis, left knee: Secondary | ICD-10-CM | POA: Diagnosis not present

## 2018-10-27 DIAGNOSIS — M1711 Unilateral primary osteoarthritis, right knee: Secondary | ICD-10-CM | POA: Diagnosis not present

## 2018-10-28 ENCOUNTER — Encounter (INDEPENDENT_AMBULATORY_CARE_PROVIDER_SITE_OTHER): Payer: Self-pay | Admitting: Family Medicine

## 2018-10-28 ENCOUNTER — Ambulatory Visit (INDEPENDENT_AMBULATORY_CARE_PROVIDER_SITE_OTHER): Payer: BLUE CROSS/BLUE SHIELD | Admitting: Family Medicine

## 2018-10-28 ENCOUNTER — Other Ambulatory Visit: Payer: Self-pay

## 2018-10-28 DIAGNOSIS — E8881 Metabolic syndrome: Secondary | ICD-10-CM

## 2018-10-28 DIAGNOSIS — E7849 Other hyperlipidemia: Secondary | ICD-10-CM

## 2018-10-28 DIAGNOSIS — F419 Anxiety disorder, unspecified: Secondary | ICD-10-CM

## 2018-10-28 DIAGNOSIS — E559 Vitamin D deficiency, unspecified: Secondary | ICD-10-CM | POA: Diagnosis not present

## 2018-10-28 DIAGNOSIS — Z6841 Body Mass Index (BMI) 40.0 and over, adult: Secondary | ICD-10-CM

## 2018-10-28 DIAGNOSIS — F411 Generalized anxiety disorder: Secondary | ICD-10-CM | POA: Diagnosis not present

## 2018-10-28 MED ORDER — FLUOXETINE HCL 10 MG PO CAPS: 10.0000 mg | ORAL_CAPSULE | ORAL | 0 refills | Status: DC

## 2018-10-28 MED ORDER — VITAMIN D (ERGOCALCIFEROL) 1.25 MG (50000 UNIT) PO CAPS: 50000.0000 [IU] | ORAL_CAPSULE | ORAL | 0 refills | Status: DC

## 2018-10-28 NOTE — Progress Notes (Signed)
Office: [phone]  /  Fax: [phone] TeleHealth Visit:  Cynthia MaffucciSarah C [name] has verbally consented to this TeleHealth visit today. The patient is located at home, the provider is located at the [facility] office. The participants in this visit include the listed provider and patient and provider's assistant. The visit was conducted today via face time.  HPI:   Chief Complaint: OBESITY Cynthia [name] is here to discuss her progress with her obesity treatment plan. She is on the keep a food journal with 1400 calories and 85-90+ grams of protein daily and is following her eating plan approximately 80 % of the time. She states she is cycling 1-2 days a week and doing physical therapy exercises for 15-20 minutes. Cynthia [name] is feeling some anxiety with COVID-19 crisis, only going out for essentials. She is not really feeling hungry and not having much of an appetite. She is staying under on calories and is getting protein in daily.  We were unable to weigh the patient today for this TeleHealth visit. She feels as if she has maintained her weight since her last visit. She has lost 0 lbs since starting treatment with us.  Vitamin D Deficiency Cynthia [name] has a diagnosis of vitamin D deficiency. She is currently taking prescription Vit D, no recent labs. She notes fatigue and denies nausea, vomiting or muscle weakness.  Generalized Anxiety Disorder Cynthia [name] has GAD and denies increase in symptoms. She voices some anxiety with COVID-19 crisis.  Insulin Resistance Cynthia [name] has a diagnosis of insulin resistance based on her elevated fasting insulin level >5. Although [name]'s blood glucose readings are still under good control, insulin resistance puts her at greater risk of metabolic syndrome and diabetes. She notes carbohydrate cravings for wine and she is not on medications. She continues to work on diet and exercise to decrease risk of diabetes.  Hyperlipidemia Cynthia [name] has hyperlipidemia and has been trying to  improve her cholesterol levels with intensive lifestyle modification including a low saturated fat diet, exercise and weight loss. Her LDL was elevated previously. She is not on statin and denies any chest pain, claudication or myalgias.  ASSESSMENT AND PLAN:  Vitamin D deficiency - Plan: VITAMIN D 25 Hydroxy (Vit-D Deficiency, Fractures), Vitamin D, Ergocalciferol, (DRISDOL) 1.25 MG (50000 UT) CAPS capsule  GAD (generalized anxiety disorder)  Insulin resistance - Plan: Hemoglobin A1c, Insulin, random  Other hyperlipidemia - Plan: Lipid Panel With LDL/HDL Ratio  Anxiety - Plan: FLUoxetine (PROZAC) 10 MG capsule  Class 3 severe obesity with serious comorbidity and body mass index (BMI) of 45.0 to 49.9 in adult, unspecified obesity type (HCC)  PLAN:  Vitamin D Deficiency Cynthia [name] was informed that low vitamin D levels contributes to fatigue and are associated with obesity, breast, and colon cancer. Cynthia [name] agrees to continue taking prescription Vit D @50 ,000 IU every week #4 and we will refill for 1 month. She will follow up for routine testing of vitamin D, at least 2-3 times per year. She was informed of the risk of over-replacement of vitamin D and agrees to not increase her dose unless she discusses this with us first. Cynthia [name] agrees to follow up with our clinic in 2 weeks.  Generalized Anxiety Disorder Cynthia [name] agrees to continue taking Prozac 10 mg 3 tablets PO daily #90 and we will refill for 1 month. Cynthia [name] agrees to follow up with our clinic in 2 weeks.  Insulin Resistance Cynthia [name] will continue to work on weight loss, exercise, and decreasing simple carbohydrates in her diet to help  decrease the risk of diabetes. We dicussed metformin including benefits and risks. She was informed that eating too many simple carbohydrates or too many calories at one sitting increases the likelihood of GI side effects. Cynthia [name] declined metformin for now and prescription was not written today. We will check Hgb A1c  and insulin today. Cynthia [name] agrees to follow up with our clinic in 2 weeks as directed to monitor her progress.  Hyperlipidemia Mileigh was informed of the American Heart Association Guidelines emphasizing intensive lifestyle modifications as the first line treatment for hyperlipidemia. We discussed many lifestyle modifications today in depth, and Iridessa will continue to work on decreasing saturated fats such as fatty red meat, butter and many fried foods. She will also increase vegetables and lean protein in her diet and continue to work on exercise and weight loss efforts. We will check FLP today. Cynthia [name] agrees to follow up with our clinic in 2 weeks.  Obesity [name] is currently in the action stage of change. As such, her goal is to continue with weight loss efforts She has agreed to keep a food journal with 1400 calories and 85-90+ grams of protein daily Cynthia [name] has been instructed to work up to a goal of 150 minutes of combined cardio and strengthening exercise per week for weight loss and overall health benefits. We discussed the following Behavioral Modification Strategies today: increasing lean protein intake, work on meal planning and easy cooking plans, ways to avoid boredom eating, no skipping meals, and planning for success   Cynthia [name] has agreed to follow up with our clinic in 2 weeks. She was informed of the importance of frequent follow up visits to maximize her success with intensive lifestyle modifications for her multiple health conditions.  ALLERGIES: Allergies  Allergen Reactions  . Aquacel [Carboxymethylcellulose] Itching, Rash and Other (See Comments)    Caused blisters  . Penicillins     Unknown reaction Has patient had a PCN reaction causing immediate rash, facial/tongue/throat swelling, SOB or lightheadedness with hypotension: Unknown Has patient had a PCN reaction causing severe rash involving mucus membranes or skin necrosis: Unknown Has patient had a PCN reaction that required  hospitalization: No Has patient had a PCN reaction occurring within the last 10 years: No If all of the above answers are "NO", then may proceed with Cephalosporin use.   . Adhesive [Tape] Rash    Prefers to use paper tape  . Latex Rash  . Oxycodone Itching and Rash    MEDICATIONS: Current Outpatient Medications on File Prior to Visit  Medication Sig Dispense Refill  . acetaminophen (TYLENOL) 500 MG tablet Take 1,000 mg by mouth 3 (three) times daily as needed for moderate pain.     Marland Kitchen albuterol (PROAIR HFA) 108 (90 Base) MCG/ACT inhaler Inhale 1-2 puffs into the lungs every 6 (six) hours as needed for wheezing or shortness of breath. 18 g 1  . alum hydroxide-mag trisilicate (GAVISCON) 80-20 MG CHEW chewable tablet Chew 1-2 tablets by mouth daily as needed for indigestion or heartburn.    Marland Kitchen BREO ELLIPTA 200-25 MCG/INH AEPB INHALE 1 PUFF INTO THE LUNGS DAILY 60 each 3  . Carbinoxamine Maleate 4 MG TABS Take 1 tablet (4 mg total) by mouth every 6 (six) hours. 28 each 5  . flunisolide (NASALIDE) 25 MCG/ACT (0.025%) SOLN Place 2 sprays into the nose daily. 1 Bottle 0  . HYDROcodone-acetaminophen (NORCO) 10-325 MG tablet Take 1 tablet by mouth every 6 (six) hours as needed.    . hyoscyamine (LEVSIN SL)  0.125 MG SL tablet Take 0.125 mg by mouth 3 (three) times daily with meals.   0  . levocetirizine (XYZAL) 5 MG tablet TAKE 1 TABLET(5 MG) BY MOUTH EVERY EVENING 30 tablet 5  . meloxicam (MOBIC) 15 MG tablet     . methocarbamol (ROBAXIN) 500 MG tablet Take 500 mg by mouth 2 (two) times daily as needed for muscle spasms.     . montelukast (SINGULAIR) 10 MG tablet TAKE 1 TABLET(10 MG) BY MOUTH AT BEDTIME 30 tablet 5  . norethindrone-ethinyl estradiol (MICROGESTIN FE 1/20) 1-20 MG-MCG tablet Take 1 tablet by mouth daily. (Patient taking differently: Take 1 tablet by mouth at bedtime. ) 3 Package 4  . olopatadine (PATANOL) 0.1 % ophthalmic solution Place 1 drop into both eyes daily as needed for  allergies.     . Olopatadine HCl (PAZEO) 0.7 % SOLN Place 1 drop into both eyes 1 day or 1 dose. 1 Bottle 5  . QNASL 80 MCG/ACT AERS USE 1 SPRAY IN EACH NOSTRIL TWICE DAILY AS NEEDED FOR ALLERGIES 10.6 g 3  . traMADol (ULTRAM) 50 MG tablet Take by mouth every 6 (six) hours as needed.    . traZODone (DESYREL) 100 MG tablet Take 100 mg by mouth at bedtime.      No current facility-administered medications on file prior to visit.     PAST MEDICAL HISTORY: Past Medical History:  Diagnosis Date  . ADHD (attention deficit hyperactivity disorder)   . Allergic rhinitis   . Anxiety   . Asthma   . Back injury    COMPETITIVE INJURY IN HIGH SCHOOL  . Back pain   . Depression   . Fatty liver   . Fibromyalgia   . Gallbladder problem   . GERD (gastroesophageal reflux disease)   . IBS (irritable bowel syndrome)   . Insulin resistance   . Joint pain   . Migraines   . OA (osteoarthritis) of knee   . Obesity   . Pneumonia   . PONV (postoperative nausea and vomiting)   . Seasonal allergies   . Vitamin D deficiency     PAST SURGICAL HISTORY: Past Surgical History:  Procedure Laterality Date  . CHOLECYSTECTOMY    . DG DILATION URETERS    . KNEE ARTHROSCOPY Left   . KNEE ARTHROSCOPY WITH LATERAL MENISECTOMY Right 04/11/2018   Procedure: RIGHT KNEE ARTHROSCOPY WITH PARTIAL MENISECTOMY CHONDROPLASTY;  Surgeon: Eugenia Mcalpine, MD;  Location: [facility];  Service: Orthopedics;  Laterality: Right;  . MOUTH SURGERY    . SHOULDER SURGERY  2002   RIGHT  . WISDOM TOOTH EXTRACTION    . WRIST ARTHROSCOPY     Right    SOCIAL HISTORY: Social History   Tobacco Use  . Smoking status: Never Smoker  . Smokeless tobacco: Never Used  Substance Use Topics  . Alcohol use: Yes    Comment: social  . Drug use: No    FAMILY HISTORY: Family History  Problem Relation Age of Onset  . Multiple sclerosis Mother   . Diabetes Mother   . Hyperlipidemia Mother   . Thyroid disease Mother   . Depression  Mother   . Anxiety disorder Mother   . Obesity Mother   . Allergic rhinitis Mother   . Hypertension Father   . Diabetes Father   . Hyperlipidemia Father   . Obesity Father   . Allergic rhinitis Father   . Diabetes Maternal Grandfather   . Cancer Maternal Grandfather  prostate  . Heart disease Maternal Grandfather   . Heart disease Paternal Grandfather   . Allergic rhinitis Paternal Aunt   . Allergic rhinitis Maternal Grandmother   . Asthma Neg Hx   . Eczema Neg Hx   . Urticaria Neg Hx     ROS: Review of Systems  Constitutional: Positive for malaise/fatigue. Negative for weight loss.  Cardiovascular: Negative for chest pain and claudication.  Gastrointestinal: Negative for nausea and vomiting.  Musculoskeletal: Negative for myalgias.       Negative muscle weakness  Psychiatric/Behavioral:       + Anxiety    PHYSICAL EXAM: Pt in no acute distress  RECENT LABS AND TESTS: BMET    Component Value Date/Time   NA 142 04/08/2018 1420   NA 139 05/01/2017 1009   K 4.6 04/08/2018 1420   CL 107 04/08/2018 1420   CO2 25 04/08/2018 1420   GLUCOSE 87 04/08/2018 1420   BUN 12 04/08/2018 1420   BUN 10 05/01/2017 1009   CREATININE 0.77 04/08/2018 1420   CALCIUM 9.1 04/08/2018 1420   GFRNONAA >60 04/08/2018 1420   GFRAA >60 04/08/2018 1420   Lab Results  Component Value Date   HGBA1C 5.5 01/16/2018   HGBA1C 5.4 10/03/2017   HGBA1C 5.3 05/01/2017   Lab Results  Component Value Date   INSULIN 12.9 01/16/2018   INSULIN 21.7 10/03/2017   INSULIN 11.1 05/01/2017   CBC    Component Value Date/Time   WBC 10.5 04/08/2018 1420   RBC 5.14 (H) 04/08/2018 1420   HGB 15.5 (H) 04/08/2018 1420   HGB 14.0 05/01/2017 1009   HCT 47.3 (H) 04/08/2018 1420   HCT 43.3 05/01/2017 1009   PLT 318 04/08/2018 1420   MCV 92.0 04/08/2018 1420   MCV 93 05/01/2017 1009   MCH 30.2 04/08/2018 1420   MCHC 32.8 04/08/2018 1420   RDW 13.7 04/08/2018 1420   RDW 13.7 05/01/2017 1009    LYMPHSABS 1.5 05/01/2017 1009   MONOABS 0.7 01/09/2012 0956   EOSABS 0.1 05/01/2017 1009   BASOSABS 0.0 05/01/2017 1009   Iron/TIBC/Ferritin/ %Sat No results found for: IRON, TIBC, FERRITIN, IRONPCTSAT Lipid Panel     Component Value Date/Time   CHOL 172 10/03/2017 0821   TRIG 73 10/03/2017 0821   HDL 53 10/03/2017 0821   CHOLHDL 3.4 01/09/2012 0956   VLDL 14 01/09/2012 0956   LDLCALC 104 (H) 10/03/2017 0821   Hepatic Function Panel     Component Value Date/Time   PROT 6.8 05/01/2017 1009   ALBUMIN 4.2 05/01/2017 1009   AST 15 05/01/2017 1009   ALT 14 05/01/2017 1009   ALKPHOS 61 05/01/2017 1009   BILITOT 0.3 05/01/2017 1009      Component Value Date/Time   TSH 1.640 05/01/2017 1009   TSH 1.850 01/09/2012 0956      I, Burt Knack, am acting as transcriptionist for Debbra Riding, MD  I have reviewed the above documentation for accuracy and completeness, and I agree with the above. - Debbra Riding, MD

## 2018-10-30 DIAGNOSIS — E559 Vitamin D deficiency, unspecified: Secondary | ICD-10-CM | POA: Diagnosis not present

## 2018-10-30 DIAGNOSIS — E8881 Metabolic syndrome: Secondary | ICD-10-CM | POA: Diagnosis not present

## 2018-10-30 DIAGNOSIS — E7849 Other hyperlipidemia: Secondary | ICD-10-CM | POA: Diagnosis not present

## 2018-10-31 LAB — VITAMIN D 25 HYDROXY (VIT D DEFICIENCY, FRACTURES): Vit D, 25-Hydroxy: 32.9 ng/mL (ref 30.0–100.0)

## 2018-10-31 LAB — LIPID PANEL WITH LDL/HDL RATIO
Cholesterol, Total: 163 mg/dL (ref 100–199)
HDL: 44 mg/dL (ref >39)
LDL Calculated: 105 mg/dL — ABNORMAL HIGH (ref 0–99)
LDl/HDL Ratio: 2.4 ratio (ref 0.0–3.2)
Triglycerides: 71 mg/dL (ref 0–149)
VLDL Cholesterol Cal: 14 mg/dL (ref 5–40)

## 2018-10-31 LAB — HEMOGLOBIN A1C
Est. average glucose Bld gHb Est-mCnc: 108 mg/dL
Hgb A1c MFr Bld: 5.4 % (ref 4.8–5.6)

## 2018-10-31 LAB — INSULIN, RANDOM: INSULIN: 29.3 u[IU]/mL — ABNORMAL HIGH (ref 2.6–24.9)

## 2018-11-11 ENCOUNTER — Ambulatory Visit (INDEPENDENT_AMBULATORY_CARE_PROVIDER_SITE_OTHER): Payer: BLUE CROSS/BLUE SHIELD | Admitting: Family Medicine

## 2018-11-11 ENCOUNTER — Encounter (INDEPENDENT_AMBULATORY_CARE_PROVIDER_SITE_OTHER): Payer: Self-pay | Admitting: Family Medicine

## 2018-11-11 ENCOUNTER — Other Ambulatory Visit: Payer: Self-pay

## 2018-11-11 DIAGNOSIS — F411 Generalized anxiety disorder: Secondary | ICD-10-CM

## 2018-11-11 DIAGNOSIS — E559 Vitamin D deficiency, unspecified: Secondary | ICD-10-CM | POA: Diagnosis not present

## 2018-11-11 DIAGNOSIS — Z6841 Body Mass Index (BMI) 40.0 and over, adult: Secondary | ICD-10-CM | POA: Diagnosis not present

## 2018-11-16 NOTE — Progress Notes (Signed)
Office: [phone]  /  Fax: [phone] TeleHealth Visit:  Simonne MaffucciSarah C Seldon has verbally consented to this TeleHealth visit today. The patient is located at home, the provider is located at the [facility] office. The participants in this visit include the listed provider and patient. The visit was conducted today via face time.  HPI:   Chief Complaint: OBESITY Cynthia SagoSarah is here to discuss her progress with her obesity treatment plan. She is on the keep a food journal with 1400 calories and 85-90+ grams of protein daily and is following her eating plan approximately 90 % of the time. She states she is cycling and walking for 60 minutes 1 time per week. [name] starting to feel stir crazy. She is not having much of an appetite and has only eaten 3 meals a day for 1 day. She is occasionally supplementing with protein shakes.  We were unable to weigh the patient today for this TeleHealth visit. She feels as if she has maintained her weight since her last visit. She has lost 0 lbs since starting treatment with us.  Generalized Anxiety Disorder Cynthia SagoSarah has a diagnosis of GAD. She has been doing a wine social distancing date with a friend daily.  Vitamin D Deficiency Cynthia SagoSarah has a diagnosis of vitamin D deficiency. She is currently taking prescription Vit D. Last Vit D level was still lower than goal. She notes fatigue and denies nausea, vomiting or muscle weakness.  ASSESSMENT AND PLAN:  GAD (generalized anxiety disorder)  Vitamin D deficiency  Class 3 severe obesity with serious comorbidity and body mass index (BMI) of 45.0 to 49.9 in adult, unspecified obesity type (HCC)  PLAN:  Generalized Anxiety Disorder Cynthia SagoSarah agrees to continue taking her current medications, and she agrees to follow up with our clinic in 2 weeks.  Vitamin D Deficiency Cynthia SagoSarah was informed that low vitamin D levels contributes to fatigue and are associated with obesity, breast, and colon cancer. Cynthia SagoSarah agrees  to continue taking prescription Vit D @50 ,000 IU every week and will follow up for routine testing of vitamin D, at least 2-3 times per year. She was informed of the risk of over-replacement of vitamin D and agrees to not increase her dose unless she discusses this with us first. Cynthia SagoSarah agrees to follow up with our clinic in 2 weeks.  Obesity Cynthia SagoSarah is currently in the action stage of change. As such, her goal is to continue with weight loss efforts She has agreed to keep a food journal with 1400 calories and 85-90+ grams of protein daily Cynthia SagoSarah has been instructed to work up to a goal of 150 minutes of combined cardio and strengthening exercise per week or start exercising consistently 3-4 times per week for weight loss and overall health benefits. We discussed the following Behavioral Modification Strategies today: increasing lean protein intake and work on meal planning and easy cooking plans, keeping healthy foods in the home, better snacking choices, and planning for success   Cynthia SagoSarah has agreed to follow up with our clinic in 2 weeks. She was informed of the importance of frequent follow up visits to maximize her success with intensive lifestyle modifications for her multiple health conditions.  ALLERGIES: Allergies  Allergen Reactions  . Aquacel [Carboxymethylcellulose] Itching, Rash and Other (See Comments)    Caused blisters  . Penicillins     Unknown reaction Has patient had a PCN reaction causing immediate rash, facial/tongue/throat swelling, SOB or lightheadedness with hypotension: Unknown Has patient had a PCN reaction  causing severe rash involving mucus membranes or skin necrosis: Unknown Has patient had a PCN reaction that required hospitalization: No Has patient had a PCN reaction occurring within the last 10 years: No If all of the above answers are "NO", then may proceed with Cephalosporin use.   . Adhesive [Tape] Rash    Prefers to use paper tape  . Latex Rash  . Oxycodone  Itching and Rash    MEDICATIONS: Current Outpatient Medications on File Prior to Visit  Medication Sig Dispense Refill  . acetaminophen (TYLENOL) 500 MG tablet Take 1,000 mg by mouth 3 (three) times daily as needed for moderate pain.     Marland Kitchen albuterol (PROAIR HFA) 108 (90 Base) MCG/ACT inhaler Inhale 1-2 puffs into the lungs every 6 (six) hours as needed for wheezing or shortness of breath. 18 g 1  . alum hydroxide-mag trisilicate (GAVISCON) 80-20 MG CHEW chewable tablet Chew 1-2 tablets by mouth daily as needed for indigestion or heartburn.    Marland Kitchen BREO ELLIPTA 200-25 MCG/INH AEPB INHALE 1 PUFF INTO THE LUNGS DAILY 60 each 3  . Carbinoxamine Maleate 4 MG TABS Take 1 tablet (4 mg total) by mouth every 6 (six) hours. 28 each 5  . flunisolide (NASALIDE) 25 MCG/ACT (0.025%) SOLN Place 2 sprays into the nose daily. 1 Bottle 0  . FLUoxetine (PROZAC) 10 MG capsule Take 1 capsule (10 mg total) by mouth as directed. Take three tabs by mouth daily 90 capsule 0  . HYDROcodone-acetaminophen (NORCO) 10-325 MG tablet Take 1 tablet by mouth every 6 (six) hours as needed.    . hyoscyamine (LEVSIN SL) 0.125 MG SL tablet Take 0.125 mg by mouth 3 (three) times daily with meals.   0  . levocetirizine (XYZAL) 5 MG tablet TAKE 1 TABLET(5 MG) BY MOUTH EVERY EVENING 30 tablet 5  . meloxicam (MOBIC) 15 MG tablet     . methocarbamol (ROBAXIN) 500 MG tablet Take 500 mg by mouth 2 (two) times daily as needed for muscle spasms.     . montelukast (SINGULAIR) 10 MG tablet TAKE 1 TABLET(10 MG) BY MOUTH AT BEDTIME 30 tablet 5  . norethindrone-ethinyl estradiol (MICROGESTIN FE 1/20) 1-20 MG-MCG tablet Take 1 tablet by mouth daily. (Patient taking differently: Take 1 tablet by mouth at bedtime. ) 3 Package 4  . olopatadine (PATANOL) 0.1 % ophthalmic solution Place 1 drop into both eyes daily as needed for allergies.     . Olopatadine HCl (PAZEO) 0.7 % SOLN Place 1 drop into both eyes 1 day or 1 dose. 1 Bottle 5  . QNASL 80 MCG/ACT  AERS USE 1 SPRAY IN EACH NOSTRIL TWICE DAILY AS NEEDED FOR ALLERGIES 10.6 g 3  . traMADol (ULTRAM) 50 MG tablet Take by mouth every 6 (six) hours as needed.    . traZODone (DESYREL) 100 MG tablet Take 100 mg by mouth at bedtime.     . Vitamin D, Ergocalciferol, (DRISDOL) 1.25 MG (50000 UT) CAPS capsule Take 1 capsule (50,000 Units total) by mouth every 7 (seven) days. 4 capsule 0   No current facility-administered medications on file prior to visit.     PAST MEDICAL HISTORY: Past Medical History:  Diagnosis Date  . ADHD (attention deficit hyperactivity disorder)   . Allergic rhinitis   . Anxiety   . Asthma   . Back injury    COMPETITIVE INJURY IN HIGH SCHOOL  . Back pain   . Depression   . Fatty liver   . Fibromyalgia   .  Gallbladder problem   . GERD (gastroesophageal reflux disease)   . IBS (irritable bowel syndrome)   . Insulin resistance   . Joint pain   . Migraines   . OA (osteoarthritis) of knee   . Obesity   . Pneumonia   . PONV (postoperative nausea and vomiting)   . Seasonal allergies   . Vitamin D deficiency     PAST SURGICAL HISTORY: Past Surgical History:  Procedure Laterality Date  . CHOLECYSTECTOMY    . DG DILATION URETERS    . KNEE ARTHROSCOPY Left   . KNEE ARTHROSCOPY WITH LATERAL MENISECTOMY Right 04/11/2018   Procedure: RIGHT KNEE ARTHROSCOPY WITH PARTIAL MENISECTOMY CHONDROPLASTY;  Surgeon: Eugenia Mcalpine, MD;  Location: [facility];  Service: Orthopedics;  Laterality: Right;  . MOUTH SURGERY    . SHOULDER SURGERY  2002   RIGHT  . WISDOM TOOTH EXTRACTION    . WRIST ARTHROSCOPY     Right    SOCIAL HISTORY: Social History   Tobacco Use  . Smoking status: Never Smoker  . Smokeless tobacco: Never Used  Substance Use Topics  . Alcohol use: Yes    Comment: social  . Drug use: No    FAMILY HISTORY: Family History  Problem Relation Age of Onset  . Multiple sclerosis Mother   . Diabetes Mother   . Hyperlipidemia Mother   . Thyroid disease  Mother   . Depression Mother   . Anxiety disorder Mother   . Obesity Mother   . Allergic rhinitis Mother   . Hypertension Father   . Diabetes Father   . Hyperlipidemia Father   . Obesity Father   . Allergic rhinitis Father   . Diabetes Maternal Grandfather   . Cancer Maternal Grandfather        prostate  . Heart disease Maternal Grandfather   . Heart disease Paternal Grandfather   . Allergic rhinitis Paternal Aunt   . Allergic rhinitis Maternal Grandmother   . Asthma Neg Hx   . Eczema Neg Hx   . Urticaria Neg Hx     ROS: Review of Systems  Constitutional: Positive for malaise/fatigue. Negative for weight loss.  Gastrointestinal: Negative for nausea and vomiting.  Musculoskeletal:       Negative muscle weakness    PHYSICAL EXAM: Pt in no acute distress  RECENT LABS AND TESTS: BMET    Component Value Date/Time   NA 142 04/08/2018 1420   NA 139 05/01/2017 1009   K 4.6 04/08/2018 1420   CL 107 04/08/2018 1420   CO2 25 04/08/2018 1420   GLUCOSE 87 04/08/2018 1420   BUN 12 04/08/2018 1420   BUN 10 05/01/2017 1009   CREATININE 0.77 04/08/2018 1420   CALCIUM 9.1 04/08/2018 1420   GFRNONAA >60 04/08/2018 1420   GFRAA >60 04/08/2018 1420   Lab Results  Component Value Date   HGBA1C 5.4 10/30/2018   HGBA1C 5.5 01/16/2018   HGBA1C 5.4 10/03/2017   HGBA1C 5.3 05/01/2017   Lab Results  Component Value Date   INSULIN 29.3 (H) 10/30/2018   INSULIN 12.9 01/16/2018   INSULIN 21.7 10/03/2017   INSULIN 11.1 05/01/2017   CBC    Component Value Date/Time   WBC 10.5 04/08/2018 1420   RBC 5.14 (H) 04/08/2018 1420   HGB 15.5 (H) 04/08/2018 1420   HGB 14.0 05/01/2017 1009   HCT 47.3 (H) 04/08/2018 1420   HCT 43.3 05/01/2017 1009   PLT 318 04/08/2018 1420   MCV 92.0 04/08/2018 1420   MCV  93 05/01/2017 1009   MCH 30.2 04/08/2018 1420   MCHC 32.8 04/08/2018 1420   RDW 13.7 04/08/2018 1420   RDW 13.7 05/01/2017 1009   LYMPHSABS 1.5 05/01/2017 1009   MONOABS 0.7  01/09/2012 0956   EOSABS 0.1 05/01/2017 1009   BASOSABS 0.0 05/01/2017 1009   Iron/TIBC/Ferritin/ %Sat No results found for: IRON, TIBC, FERRITIN, IRONPCTSAT Lipid Panel     Component Value Date/Time   CHOL 163 10/30/2018 1206   TRIG 71 10/30/2018 1206   HDL 44 10/30/2018 1206   CHOLHDL 3.4 01/09/2012 0956   VLDL 14 01/09/2012 0956   LDLCALC 105 (H) 10/30/2018 1206   Hepatic Function Panel     Component Value Date/Time   PROT 6.8 05/01/2017 1009   ALBUMIN 4.2 05/01/2017 1009   AST 15 05/01/2017 1009   ALT 14 05/01/2017 1009   ALKPHOS 61 05/01/2017 1009   BILITOT 0.3 05/01/2017 1009      Component Value Date/Time   TSH 1.640 05/01/2017 1009   TSH 1.850 01/09/2012 0956      I, Burt Knack, am acting as transcriptionist for Debbra Riding, MD  I have reviewed the above documentation for accuracy and completeness, and I agree with the above. - Debbra Riding, MD

## 2018-11-24 ENCOUNTER — Other Ambulatory Visit: Payer: Self-pay

## 2018-11-24 ENCOUNTER — Ambulatory Visit (INDEPENDENT_AMBULATORY_CARE_PROVIDER_SITE_OTHER): Payer: BLUE CROSS/BLUE SHIELD | Admitting: Family Medicine

## 2018-11-24 ENCOUNTER — Encounter (INDEPENDENT_AMBULATORY_CARE_PROVIDER_SITE_OTHER): Payer: Self-pay | Admitting: Family Medicine

## 2018-11-24 DIAGNOSIS — Z6841 Body Mass Index (BMI) 40.0 and over, adult: Secondary | ICD-10-CM

## 2018-11-24 DIAGNOSIS — E8881 Metabolic syndrome: Secondary | ICD-10-CM | POA: Diagnosis not present

## 2018-11-24 DIAGNOSIS — E559 Vitamin D deficiency, unspecified: Secondary | ICD-10-CM | POA: Diagnosis not present

## 2018-11-25 NOTE — Progress Notes (Signed)
Office: [phone]  /  Fax: [phone] TeleHealth Visit:  Cynthia [name] has verbally consented to this TeleHealth visit today. The patient is located at home, the provider is located at the [facility] office. The participants in this visit include the listed provider and patient. The visit was conducted today via face time.  HPI:   Chief Complaint: OBESITY Cynthia [name] is here to discuss her progress with her obesity treatment plan. She is on the keep a food journal with 1400 calories and 85-90+ grams of protein daily and is following her eating plan approximately 90-100 % of the time. She states she is exercising 0 minutes 0 times per week. Cynthia [name] is still feeling somewhat antsy with staying at home. She is staying the same weight even with consistency of journaling. She says calorie wise she has been doing 1400-1450 calories and around 60 grams of protein daily.  We were unable to weigh the patient today for this TeleHealth visit. She feels as if she has maintained her weight since her last visit. She has lost 0 lbs since starting treatment with [place].  Insulin Resistance Cynthia [name] has a diagnosis of insulin resistance based on her elevated fasting insulin level >5. Last insulin was of 29.3 and Hgb A1c of 5.4. Although Cynthia [name]'s blood glucose readings are still under good control, insulin resistance puts her at greater risk of metabolic syndrome and diabetes. She is not on medications and continues to work on diet and exercise to decrease risk of diabetes.  Vitamin D Deficiency Cynthia [name] has a diagnosis of vitamin D deficiency. She is currently taking prescription Vit D. She notes fatigue and denies nausea, vomiting or muscle weakness.  ASSESSMENT AND PLAN:  Insulin resistance  Vitamin D deficiency  Class 3 severe obesity with serious comorbidity and body mass index (BMI) of 45.0 to 49.9 in adult, unspecified obesity type (HCC)  PLAN:  Insulin Resistance Cynthia [name] will continue to work  on weight loss, exercise, and decreasing simple carbohydrates in her diet to help decrease the risk of diabetes. We dicussed metformin including benefits and risks. She was informed that eating too many simple carbohydrates or too many calories at one sitting increases the likelihood of GI side effects. [name] declined metformin for now and prescription was not written today. We will repeat labs in mid July. Cynthia [name] agrees to follow up with our clinic in 2 weeks as directed to monitor her progress.  Vitamin D Deficiency Cynthia [name] was informed that low vitamin D levels contributes to fatigue and are associated with obesity, breast, and colon cancer. Cynthia [name] agrees to continue taking prescription Vit D ,000 IU every week, no refill needed. She will follow up for routine testing of vitamin D, at least 2-3 times per year. She was informed of the risk of over-replacement of vitamin D and agrees to not increase her dose unless she discusses this with [place] first. Cynthia [name] agrees to follow up with our clinic in 2 weeks.  Obesity Cynthia [name] is currently in the action stage of change. As such, her goal is to continue with weight loss efforts She has agreed to keep a food journal with 1400 calories and 85-90+ grams of protein daily Cynthia [name] has been instructed to work up to a goal of 150 minutes of combined cardio and strengthening exercise per week for weight loss and overall health benefits. We discussed the following Behavioral Modification Strategies today: increasing lean protein intake, increasing vegetables and work on meal planning and easy cooking plans, keeping healthy foods  in the home, better snacking choices, and planning for success   Cynthia SagoSarah has agreed to follow up with our clinic in 2 weeks. She was informed of the importance of frequent follow up visits to maximize her success with intensive lifestyle modifications for her multiple health conditions.  ALLERGIES: Allergies  Allergen Reactions  . Aquacel  [Carboxymethylcellulose] Itching, Rash and Other (See Comments)    Caused blisters  . Penicillins     Unknown reaction Has patient had a PCN reaction causing immediate rash, facial/tongue/throat swelling, SOB or lightheadedness with hypotension: Unknown Has patient had a PCN reaction causing severe rash involving mucus membranes or skin necrosis: Unknown Has patient had a PCN reaction that required hospitalization: No Has patient had a PCN reaction occurring within the last 10 years: No If all of the above answers are "NO", then may proceed with Cephalosporin use.   . Adhesive [Tape] Rash    Prefers to use paper tape  . Latex Rash  . Oxycodone Itching and Rash    MEDICATIONS: Current Outpatient Medications on File Prior to Visit  Medication Sig Dispense Refill  . acetaminophen (TYLENOL) 500 MG tablet Take 1,000 mg by mouth 3 (three) times daily as needed for moderate pain.     Marland Kitchen. albuterol (PROAIR HFA) 108 (90 Base) MCG/ACT inhaler Inhale 1-2 puffs into the lungs every 6 (six) hours as needed for wheezing or shortness of breath. 18 g 1  . alum hydroxide-mag trisilicate (GAVISCON) 80-20 MG CHEW chewable tablet Chew 1-2 tablets by mouth daily as needed for indigestion or heartburn.    Marland Kitchen. BREO ELLIPTA 200-25 MCG/INH AEPB INHALE 1 PUFF INTO THE LUNGS DAILY 60 each 3  . Carbinoxamine Maleate 4 MG TABS Take 1 tablet (4 mg total) by mouth every 6 (six) hours. 28 each 5  . flunisolide (NASALIDE) 25 MCG/ACT (0.025%) SOLN Place 2 sprays into the nose daily. 1 Bottle 0  . FLUoxetine (PROZAC) 10 MG capsule Take 1 capsule (10 mg total) by mouth as directed. Take three tabs by mouth daily 90 capsule 0  . HYDROcodone-acetaminophen (NORCO) 10-325 MG tablet Take 1 tablet by mouth every 6 (six) hours as needed.    . hyoscyamine (LEVSIN SL) 0.125 MG SL tablet Take 0.125 mg by mouth 3 (three) times daily with meals.   0  . levocetirizine (XYZAL) 5 MG tablet TAKE 1 TABLET(5 MG) BY MOUTH EVERY EVENING 30 tablet  5  . meloxicam (MOBIC) 15 MG tablet     . methocarbamol (ROBAXIN) 500 MG tablet Take 500 mg by mouth 2 (two) times daily as needed for muscle spasms.     . montelukast (SINGULAIR) 10 MG tablet TAKE 1 TABLET(10 MG) BY MOUTH AT BEDTIME 30 tablet 5  . norethindrone-ethinyl estradiol (MICROGESTIN FE 1/20) 1-20 MG-MCG tablet Take 1 tablet by mouth daily. (Patient taking differently: Take 1 tablet by mouth at bedtime. ) 3 Package 4  . olopatadine (PATANOL) 0.1 % ophthalmic solution Place 1 drop into both eyes daily as needed for allergies.     . Olopatadine HCl (PAZEO) 0.7 % SOLN Place 1 drop into both eyes 1 day or 1 dose. 1 Bottle 5  . QNASL 80 MCG/ACT AERS USE 1 SPRAY IN EACH NOSTRIL TWICE DAILY AS NEEDED FOR ALLERGIES 10.6 g 3  . traMADol (ULTRAM) 50 MG tablet Take by mouth every 6 (six) hours as needed.    . traZODone (DESYREL) 100 MG tablet Take 100 mg by mouth at bedtime.     . Vitamin  D, Ergocalciferol, (DRISDOL) 1.25 MG (50000 UT) CAPS capsule Take 1 capsule (50,000 Units total) by mouth every 7 (seven) days. 4 capsule 0   No current facility-administered medications on file prior to visit.     PAST MEDICAL HISTORY: Past Medical History:  Diagnosis Date  . ADHD (attention deficit hyperactivity disorder)   . Allergic rhinitis   . Anxiety   . Asthma   . Back injury    COMPETITIVE INJURY IN HIGH SCHOOL  . Back pain   . Depression   . Fatty liver   . Fibromyalgia   . Gallbladder problem   . GERD (gastroesophageal reflux disease)   . IBS (irritable bowel syndrome)   . Insulin resistance   . Joint pain   . Migraines   . OA (osteoarthritis) of knee   . Obesity   . Pneumonia   . PONV (postoperative nausea and vomiting)   . Seasonal allergies   . Vitamin D deficiency     PAST SURGICAL HISTORY: Past Surgical History:  Procedure Laterality Date  . CHOLECYSTECTOMY    . DG DILATION URETERS    . KNEE ARTHROSCOPY Left   . KNEE ARTHROSCOPY WITH LATERAL MENISECTOMY Right 04/11/2018    Procedure: RIGHT KNEE ARTHROSCOPY WITH PARTIAL MENISECTOMY CHONDROPLASTY;  Surgeon: Eugenia Mcalpine, MD;  Location: [facility];  Service: Orthopedics;  Laterality: Right;  . MOUTH SURGERY    . SHOULDER SURGERY  2002   RIGHT  . WISDOM TOOTH EXTRACTION    . WRIST ARTHROSCOPY     Right    SOCIAL HISTORY: Social History   Tobacco Use  . Smoking status: Never Smoker  . Smokeless tobacco: Never Used  Substance Use Topics  . Alcohol use: Yes    Comment: social  . Drug use: No    FAMILY HISTORY: Family History  Problem Relation Age of Onset  . Multiple sclerosis Mother   . Diabetes Mother   . Hyperlipidemia Mother   . Thyroid disease Mother   . Depression Mother   . Anxiety disorder Mother   . Obesity Mother   . Allergic rhinitis Mother   . Hypertension Father   . Diabetes Father   . Hyperlipidemia Father   . Obesity Father   . Allergic rhinitis Father   . Diabetes Maternal Grandfather   . Cancer Maternal Grandfather        prostate  . Heart disease Maternal Grandfather   . Heart disease Paternal Grandfather   . Allergic rhinitis Paternal Aunt   . Allergic rhinitis Maternal Grandmother   . Asthma Neg Hx   . Eczema Neg Hx   . Urticaria Neg Hx     ROS: Review of Systems  Constitutional: Positive for malaise/fatigue. Negative for weight loss.  Gastrointestinal: Negative for nausea and vomiting.  Musculoskeletal:       Negative muscle weakness    PHYSICAL EXAM: Pt in no acute distress  RECENT LABS AND TESTS: BMET    Component Value Date/Time   NA 142 04/08/2018 1420   NA 139 05/01/2017 1009   K 4.6 04/08/2018 1420   CL 107 04/08/2018 1420   CO2 25 04/08/2018 1420   GLUCOSE 87 04/08/2018 1420   BUN 12 04/08/2018 1420   BUN 10 05/01/2017 1009   CREATININE 0.77 04/08/2018 1420   CALCIUM 9.1 04/08/2018 1420   GFRNONAA >60 04/08/2018 1420   GFRAA >60 04/08/2018 1420   Lab Results  Component Value Date   HGBA1C 5.4 10/30/2018   HGBA1C 5.5 01/16/2018  HGBA1C 5.4 10/03/2017   HGBA1C 5.3 05/01/2017   Lab Results  Component Value Date   INSULIN 29.3 (H) 10/30/2018   INSULIN 12.9 01/16/2018   INSULIN 21.7 10/03/2017   INSULIN 11.1 05/01/2017   CBC    Component Value Date/Time   WBC 10.5 04/08/2018 1420   RBC 5.14 (H) 04/08/2018 1420   HGB 15.5 (H) 04/08/2018 1420   HGB 14.0 05/01/2017 1009   HCT 47.3 (H) 04/08/2018 1420   HCT 43.3 05/01/2017 1009   PLT 318 04/08/2018 1420   MCV 92.0 04/08/2018 1420   MCV 93 05/01/2017 1009   MCH 30.2 04/08/2018 1420   MCHC 32.8 04/08/2018 1420   RDW 13.7 04/08/2018 1420   RDW 13.7 05/01/2017 1009   LYMPHSABS 1.5 05/01/2017 1009   MONOABS 0.7 01/09/2012 0956   EOSABS 0.1 05/01/2017 1009   BASOSABS 0.0 05/01/2017 1009   Iron/TIBC/Ferritin/ %Sat No results found for: IRON, TIBC, FERRITIN, IRONPCTSAT Lipid Panel     Component Value Date/Time   CHOL 163 10/30/2018 1206   TRIG 71 10/30/2018 1206   HDL 44 10/30/2018 1206   CHOLHDL 3.4 01/09/2012 0956   VLDL 14 01/09/2012 0956   LDLCALC 105 (H) 10/30/2018 1206   Hepatic Function Panel     Component Value Date/Time   PROT 6.8 05/01/2017 1009   ALBUMIN 4.2 05/01/2017 1009   AST 15 05/01/2017 1009   ALT 14 05/01/2017 1009   ALKPHOS 61 05/01/2017 1009   BILITOT 0.3 05/01/2017 1009      Component Value Date/Time   TSH 1.640 05/01/2017 1009   TSH 1.850 01/09/2012 0956      I, Burt Knack, am acting as transcriptionist for Debbra Riding, MD  I have reviewed the above documentation for accuracy and completeness, and I agree with the above. - Debbra Riding, MD

## 2018-11-28 DIAGNOSIS — M1712 Unilateral primary osteoarthritis, left knee: Secondary | ICD-10-CM | POA: Insufficient documentation

## 2018-12-01 DIAGNOSIS — M1712 Unilateral primary osteoarthritis, left knee: Secondary | ICD-10-CM | POA: Diagnosis not present

## 2018-12-03 ENCOUNTER — Other Ambulatory Visit (INDEPENDENT_AMBULATORY_CARE_PROVIDER_SITE_OTHER): Payer: Self-pay | Admitting: Family Medicine

## 2018-12-03 DIAGNOSIS — F419 Anxiety disorder, unspecified: Secondary | ICD-10-CM

## 2018-12-04 MED ORDER — FLUOXETINE HCL 10 MG PO CAPS: 10.0000 mg | ORAL_CAPSULE | ORAL | 0 refills | Status: DC

## 2018-12-08 ENCOUNTER — Ambulatory Visit (INDEPENDENT_AMBULATORY_CARE_PROVIDER_SITE_OTHER): Payer: BLUE CROSS/BLUE SHIELD | Admitting: Family Medicine

## 2018-12-08 DIAGNOSIS — M1712 Unilateral primary osteoarthritis, left knee: Secondary | ICD-10-CM | POA: Diagnosis not present

## 2018-12-17 ENCOUNTER — Ambulatory Visit (INDEPENDENT_AMBULATORY_CARE_PROVIDER_SITE_OTHER): Payer: BLUE CROSS/BLUE SHIELD | Admitting: Family Medicine

## 2018-12-17 ENCOUNTER — Other Ambulatory Visit: Payer: Self-pay

## 2018-12-17 ENCOUNTER — Encounter (INDEPENDENT_AMBULATORY_CARE_PROVIDER_SITE_OTHER): Payer: Self-pay | Admitting: Family Medicine

## 2018-12-17 DIAGNOSIS — E559 Vitamin D deficiency, unspecified: Secondary | ICD-10-CM

## 2018-12-17 DIAGNOSIS — E8881 Metabolic syndrome: Secondary | ICD-10-CM

## 2018-12-17 DIAGNOSIS — Z6841 Body Mass Index (BMI) 40.0 and over, adult: Secondary | ICD-10-CM | POA: Diagnosis not present

## 2018-12-17 DIAGNOSIS — M1712 Unilateral primary osteoarthritis, left knee: Secondary | ICD-10-CM | POA: Diagnosis not present

## 2018-12-18 MED ORDER — VITAMIN D (ERGOCALCIFEROL) 1.25 MG (50000 UNIT) PO CAPS: 50000.0000 [IU] | ORAL_CAPSULE | ORAL | 0 refills | Status: DC

## 2018-12-18 NOTE — Progress Notes (Signed)
Office: [phone]  /  Fax: [phone] TeleHealth Visit:  Cynthia [name] has verbally consented to this TeleHealth visit today. The patient is located at home, the provider is located at the [facility] office. The participants in this visit include the listed provider and patient. The visit was conducted today via face time.  HPI:   Chief Complaint: OBESITY Cynthia [name] is here to discuss her progress with her obesity treatment plan. She is on the keep a food journal with 1400 calories and 85-90+ grams of protein daily and is following her eating plan approximately 90 % of the time. She states she is exercising 0 minutes 0 times per week. Cynthia [name] just received her 3rd knee injection today and will be trying PRP in the future. She voices she is often only getting in 1-2 meals a day. She is getting calories within target and protein only occasionally. She states her weight is of 274 lbs today. We were unable to weigh the patient today for this TeleHealth visit. She feels as if she has maintained her weight since her last visit. She has lost 0 lbs since starting treatment with [place].  Vitamin D Deficiency Cynthia [name] has a diagnosis of vitamin D deficiency. She is currently taking prescription Vit D. She notes fatigue and denies nausea, vomiting or muscle weakness.  Insulin Resistance Cynthia [name] has a diagnosis of insulin resistance based on her elevated fasting insulin level >5. Although Cynthia [name]'s blood glucose readings are still under good control, insulin resistance puts her at greater risk of metabolic syndrome and diabetes. She notes minimal carbohydrate cravings and she is not taking metformin currently. She continues to work on diet and exercise to decrease risk of diabetes.  ASSESSMENT AND PLAN:  Vitamin D deficiency - Plan: Vitamin D, Ergocalciferol, (DRISDOL) 1.25 MG (50000 UT) CAPS capsule  Insulin resistance  Class 3 severe obesity with serious comorbidity and body mass index (BMI) of  45.0 to 49.9 in adult, unspecified obesity type (HCC)  PLAN:  Vitamin D Deficiency Cynthia [name] was informed that low vitamin D levels contributes to fatigue and are associated with obesity, breast, and colon cancer. Cynthia [name] agrees to continue taking prescription Vit D ,000 IU every week #4 and we will refill for 1 month. She will follow up for routine testing of vitamin D, at least 2-3 times per year. She was informed of the risk of over-replacement of vitamin D and agrees to not increase her dose unless she discusses this with [place] first. Cynthia [name] agrees to follow up with our clinic in 2 weeks.  Insulin Resistance Cynthia [name] will continue to work on weight loss, exercise, and decreasing simple carbohydrates in her diet to help decrease the risk of diabetes. We dicussed metformin including benefits and risks. She was informed that eating too many simple carbohydrates or too many calories at one sitting increases the likelihood of GI side effects. Cynthia [name] declined metformin for now and prescription was not written today. We will repeat labs in mid or the end of July. Earlene agrees to follow up with our clinic in 2 weeks as directed to monitor her progress.  Obesity Cynthia [name] is currently in the action stage of change. As such, her goal is to continue with weight loss efforts She has agreed to keep a food journal with 1400 calories and 85-90+ grams of protein daily Cynthia [name] has been instructed to work up to a goal of 150 minutes of combined cardio and strengthening exercise per week for weight loss and overall health benefits.  We discussed the following Behavioral Modification Strategies today: increasing lean protein intake, increasing vegetables and work on meal planning and easy cooking plans, keeping healthy foods in the home, and planning for success   Cynthia [name] has agreed to follow up with our clinic in 2 weeks. She was informed of the importance of frequent follow up visits to maximize her success with intensive lifestyle  modifications for her multiple health conditions.  ALLERGIES: Allergies  Allergen Reactions  . Aquacel [Carboxymethylcellulose] Itching, Rash and Other (See Comments)    Caused blisters  . Penicillins     Unknown reaction Has patient had a PCN reaction causing immediate rash, facial/tongue/throat swelling, SOB or lightheadedness with hypotension: Unknown Has patient had a PCN reaction causing severe rash involving mucus membranes or skin necrosis: Unknown Has patient had a PCN reaction that required hospitalization: No Has patient had a PCN reaction occurring within the last 10 years: No If all of the above answers are "NO", then may proceed with Cephalosporin use.   . Adhesive [Tape] Rash    Prefers to use paper tape  . Latex Rash  . Oxycodone Itching and Rash    MEDICATIONS: Current Outpatient Medications on File Prior to Visit  Medication Sig Dispense Refill  . acetaminophen (TYLENOL) 500 MG tablet Take 1,000 mg by mouth 3 (three) times daily as needed for moderate pain.     Marland Kitchen albuterol (PROAIR HFA) 108 (90 Base) MCG/ACT inhaler Inhale 1-2 puffs into the lungs every 6 (six) hours as needed for wheezing or shortness of breath. 18 g 1  . alum hydroxide-mag trisilicate (GAVISCON) 80-20 MG CHEW chewable tablet Chew 1-2 tablets by mouth daily as needed for indigestion or heartburn.    Marland Kitchen BREO ELLIPTA 200-25 MCG/INH AEPB INHALE 1 PUFF INTO THE LUNGS DAILY 60 each 3  . Carbinoxamine Maleate 4 MG TABS Take 1 tablet (4 mg total) by mouth every 6 (six) hours. 28 each 5  . flunisolide (NASALIDE) 25 MCG/ACT (0.025%) SOLN Place 2 sprays into the nose daily. 1 Bottle 0  . FLUoxetine (PROZAC) 10 MG capsule Take 1 capsule (10 mg total) by mouth as directed. Take three tabs by mouth daily 90 capsule 0  . HYDROcodone-acetaminophen (NORCO) 10-325 MG tablet Take 1 tablet by mouth every 6 (six) hours as needed.    . hyoscyamine (LEVSIN SL) 0.125 MG SL tablet Take 0.125 mg by mouth 3 (three) times daily  with meals.   0  . levocetirizine (XYZAL) 5 MG tablet TAKE 1 TABLET(5 MG) BY MOUTH EVERY EVENING 30 tablet 5  . meloxicam (MOBIC) 15 MG tablet     . methocarbamol (ROBAXIN) 500 MG tablet Take 500 mg by mouth 2 (two) times daily as needed for muscle spasms.     . montelukast (SINGULAIR) 10 MG tablet TAKE 1 TABLET(10 MG) BY MOUTH AT BEDTIME 30 tablet 5  . norethindrone-ethinyl estradiol (MICROGESTIN FE 1/20) 1-20 MG-MCG tablet Take 1 tablet by mouth daily. (Patient taking differently: Take 1 tablet by mouth at bedtime. ) 3 Package 4  . olopatadine (PATANOL) 0.1 % ophthalmic solution Place 1 drop into both eyes daily as needed for allergies.     . Olopatadine HCl (PAZEO) 0.7 % SOLN Place 1 drop into both eyes 1 day or 1 dose. 1 Bottle 5  . QNASL 80 MCG/ACT AERS USE 1 SPRAY IN EACH NOSTRIL TWICE DAILY AS NEEDED FOR ALLERGIES 10.6 g 3  . traMADol (ULTRAM) 50 MG tablet Take by mouth every 6 (six) hours as  needed.    . traZODone (DESYREL) 100 MG tablet Take 100 mg by mouth at bedtime.     . Vitamin D, Ergocalciferol, (DRISDOL) 1.25 MG (50000 UT) CAPS capsule Take 1 capsule (50,000 Units total) by mouth every 7 (seven) days. 4 capsule 0   No current facility-administered medications on file prior to visit.     PAST MEDICAL HISTORY: Past Medical History:  Diagnosis Date  . ADHD (attention deficit hyperactivity disorder)   . Allergic rhinitis   . Anxiety   . Asthma   . Back injury    COMPETITIVE INJURY IN HIGH SCHOOL  . Back pain   . Depression   . Fatty liver   . Fibromyalgia   . Gallbladder problem   . GERD (gastroesophageal reflux disease)   . IBS (irritable bowel syndrome)   . Insulin resistance   . Joint pain   . Migraines   . OA (osteoarthritis) of knee   . Obesity   . Pneumonia   . PONV (postoperative nausea and vomiting)   . Seasonal allergies   . Vitamin D deficiency     PAST SURGICAL HISTORY: Past Surgical History:  Procedure Laterality Date  . CHOLECYSTECTOMY    . DG  DILATION URETERS    . KNEE ARTHROSCOPY Left   . KNEE ARTHROSCOPY WITH LATERAL MENISECTOMY Right 04/11/2018   Procedure: RIGHT KNEE ARTHROSCOPY WITH PARTIAL MENISECTOMY CHONDROPLASTY;  Surgeon: Eugenia Mcalpine, MD;  Location: [facility];  Service: Orthopedics;  Laterality: Right;  . MOUTH SURGERY    . SHOULDER SURGERY  2002   RIGHT  . WISDOM TOOTH EXTRACTION    . WRIST ARTHROSCOPY     Right    SOCIAL HISTORY: Social History   Tobacco Use  . Smoking status: Never Smoker  . Smokeless tobacco: Never Used  Substance Use Topics  . Alcohol use: Yes    Comment: social  . Drug use: No    FAMILY HISTORY: Family History  Problem Relation Age of Onset  . Multiple sclerosis Mother   . Diabetes Mother   . Hyperlipidemia Mother   . Thyroid disease Mother   . Depression Mother   . Anxiety disorder Mother   . Obesity Mother   . Allergic rhinitis Mother   . Hypertension Father   . Diabetes Father   . Hyperlipidemia Father   . Obesity Father   . Allergic rhinitis Father   . Diabetes Maternal Grandfather   . Cancer Maternal Grandfather        prostate  . Heart disease Maternal Grandfather   . Heart disease Paternal Grandfather   . Allergic rhinitis Paternal Aunt   . Allergic rhinitis Maternal Grandmother   . Asthma Neg Hx   . Eczema Neg Hx   . Urticaria Neg Hx     ROS: Review of Systems  Constitutional: Positive for malaise/fatigue. Negative for weight loss.  Gastrointestinal: Negative for nausea and vomiting.  Musculoskeletal:       Negative muscle weakness    PHYSICAL EXAM: Pt in no acute distress  RECENT LABS AND TESTS: BMET    Component Value Date/Time   NA 142 04/08/2018 1420   NA 139 05/01/2017 1009   K 4.6 04/08/2018 1420   CL 107 04/08/2018 1420   CO2 25 04/08/2018 1420   GLUCOSE 87 04/08/2018 1420   BUN 12 04/08/2018 1420   BUN 10 05/01/2017 1009   CREATININE 0.77 04/08/2018 1420   CALCIUM 9.1 04/08/2018 1420   GFRNONAA >60 04/08/2018 1420  GFRAA >60  04/08/2018 1420   Lab Results  Component Value Date   HGBA1C 5.4 10/30/2018   HGBA1C 5.5 01/16/2018   HGBA1C 5.4 10/03/2017   HGBA1C 5.3 05/01/2017   Lab Results  Component Value Date   INSULIN 29.3 (H) 10/30/2018   INSULIN 12.9 01/16/2018   INSULIN 21.7 10/03/2017   INSULIN 11.1 05/01/2017   CBC    Component Value Date/Time   WBC 10.5 04/08/2018 1420   RBC 5.14 (H) 04/08/2018 1420   HGB 15.5 (H) 04/08/2018 1420   HGB 14.0 05/01/2017 1009   HCT 47.3 (H) 04/08/2018 1420   HCT 43.3 05/01/2017 1009   PLT 318 04/08/2018 1420   MCV 92.0 04/08/2018 1420   MCV 93 05/01/2017 1009   MCH 30.2 04/08/2018 1420   MCHC 32.8 04/08/2018 1420   RDW 13.7 04/08/2018 1420   RDW 13.7 05/01/2017 1009   LYMPHSABS 1.5 05/01/2017 1009   MONOABS 0.7 01/09/2012 0956   EOSABS 0.1 05/01/2017 1009   BASOSABS 0.0 05/01/2017 1009   Iron/TIBC/Ferritin/ %Sat No results found for: IRON, TIBC, FERRITIN, IRONPCTSAT Lipid Panel     Component Value Date/Time   CHOL 163 10/30/2018 1206   TRIG 71 10/30/2018 1206   HDL 44 10/30/2018 1206   CHOLHDL 3.4 01/09/2012 0956   VLDL 14 01/09/2012 0956   LDLCALC 105 (H) 10/30/2018 1206   Hepatic Function Panel     Component Value Date/Time   PROT 6.8 05/01/2017 1009   ALBUMIN 4.2 05/01/2017 1009   AST 15 05/01/2017 1009   ALT 14 05/01/2017 1009   ALKPHOS 61 05/01/2017 1009   BILITOT 0.3 05/01/2017 1009      Component Value Date/Time   TSH 1.640 05/01/2017 1009   TSH 1.850 01/09/2012 0956      I, Burt KnackSharon Martin, am acting as transcriptionist for Debbra RidingAlexandria Kadolph, MD  I have reviewed the above documentation for accuracy and completeness, and I agree with the above. - Debbra RidingAlexandria Kadolph, MD

## 2019-01-01 ENCOUNTER — Ambulatory Visit (INDEPENDENT_AMBULATORY_CARE_PROVIDER_SITE_OTHER): Payer: BLUE CROSS/BLUE SHIELD | Admitting: Family Medicine

## 2019-01-05 ENCOUNTER — Other Ambulatory Visit (INDEPENDENT_AMBULATORY_CARE_PROVIDER_SITE_OTHER): Payer: Self-pay | Admitting: Family Medicine

## 2019-01-05 DIAGNOSIS — F419 Anxiety disorder, unspecified: Secondary | ICD-10-CM

## 2019-01-08 ENCOUNTER — Ambulatory Visit (INDEPENDENT_AMBULATORY_CARE_PROVIDER_SITE_OTHER): Payer: BC Managed Care – PPO | Admitting: Family Medicine

## 2019-01-08 ENCOUNTER — Other Ambulatory Visit: Payer: Self-pay

## 2019-01-08 DIAGNOSIS — E8881 Metabolic syndrome: Secondary | ICD-10-CM | POA: Diagnosis not present

## 2019-01-08 DIAGNOSIS — F419 Anxiety disorder, unspecified: Secondary | ICD-10-CM | POA: Diagnosis not present

## 2019-01-08 DIAGNOSIS — Z6841 Body Mass Index (BMI) 40.0 and over, adult: Secondary | ICD-10-CM | POA: Diagnosis not present

## 2019-01-08 MED ORDER — FLUOXETINE HCL 10 MG PO CAPS: 10.0000 mg | ORAL_CAPSULE | ORAL | 0 refills | Status: DC

## 2019-01-12 NOTE — Progress Notes (Signed)
Office: [phone]  /  Fax: [phone] TeleHealth Visit:  Cynthia MaffucciSarah C [name] has verbally consented to this TeleHealth visit today. The patient is located at home, the provider is located at the [facility] office. The participants in this visit include the listed provider and patient. The visit was conducted today via face time.  HPI:   Chief Complaint: OBESITY Cynthia [name] is here to discuss her progress with her obesity treatment plan. She is on the keep a food journal with 1400 calories and 85-90+ grams of protein daily and is following her eating plan approximately 70 % of the time. She states she is exercising 0 minutes 0 times per week. Cynthia [name]'s weight is of 276 lbs today (started menstrual cycle). She has gotten off the plan and has found herself eating out more. She has tried to get up earlier and eat breakfast a few days in the past few weeks.  We were unable to weigh the patient today for this TeleHealth visit. She feels as if she has gained 2 lbs since her last visit. She has lost 0 lbs since starting treatment with us.  Depression with Anxiety Cynthia [name] struggles with depression with anxiety. She notes her symptoms are better controlled. She shows no sign of suicidal or homicidal ideations.  Insulin Resistance Cynthia [name] has a diagnosis of insulin resistance based on her elevated fasting insulin level >5. Although Cynthia [name]'s blood glucose readings are still under good control, insulin resistance puts her at greater risk of metabolic syndrome and diabetes. She notes carbohydrate cravings with eating out. She is not taking metformin currently and continues to work on diet and exercise to decrease risk of diabetes.  ASSESSMENT AND PLAN:  Anxiety - Plan: FLUoxetine (PROZAC) 10 MG capsule  Insulin resistance  Class 3 severe obesity with serious comorbidity and body mass index (BMI) of 45.0 to 49.9 in adult, unspecified obesity type (HCC)  PLAN:  Depression with Anxiety We  discussed behavior modification techniques today to help Cynthia [name] deal with her depression and anxiety. Cynthia [name] agrees to continue taking Prozac 10 mg 3 tablets PO daily #90 and we will refill for 1 month. Cynthia [name] agrees to follow up with our clinic in 2 weeks.   Insulin Resistance Cynthia [name] will continue to work on weight loss, exercise, and decreasing simple carbohydrates in her diet to help decrease the risk of diabetes. We dicussed metformin including benefits and risks. She was informed that eating too many simple carbohydrates or too many calories at one sitting increases the likelihood of GI side effects. Modine declined metformin for now and prescription was not written today. We will repeat labs in mid or late July. Cynthia [name] agrees to follow up with our clinic in 2 weeks as directed to monitor her progress.  Obesity Cynthia [name] is currently in the action stage of change. As such, her goal is to continue with weight loss efforts She has agreed to follow the Category 2 plan Cynthia [name] has been instructed to work up to a goal of 150 minutes of combined cardio and strengthening exercise per week for weight loss and overall health benefits. We discussed the following Behavioral Modification Strategies today: increasing lean protein intake, increasing vegetables, work on meal planning and easy cooking plans, dealing with family or coworker sabotage, and keep a strict food journal   Cynthia [name] has agreed to follow up with our clinic in 2 weeks. She was informed of the importance of frequent follow up visits to maximize her success with intensive lifestyle modifications for her  multiple health conditions.  ALLERGIES: Allergies  Allergen Reactions  . Aquacel [Carboxymethylcellulose] Itching, Rash and Other (See Comments)    Caused blisters  . Penicillins     Unknown reaction Has patient had a PCN reaction causing immediate rash, facial/tongue/throat swelling, SOB or lightheadedness with hypotension: Unknown Has patient had  a PCN reaction causing severe rash involving mucus membranes or skin necrosis: Unknown Has patient had a PCN reaction that required hospitalization: No Has patient had a PCN reaction occurring within the last 10 years: No If all of the above answers are "NO", then may proceed with Cephalosporin use.   . Adhesive [Tape] Rash    Prefers to use paper tape  . Latex Rash  . Oxycodone Itching and Rash    MEDICATIONS: Current Outpatient Medications on File Prior to Visit  Medication Sig Dispense Refill  . acetaminophen (TYLENOL) 500 MG tablet Take 1,000 mg by mouth 3 (three) times daily as needed for moderate pain.     Marland Kitchen. albuterol (PROAIR HFA) 108 (90 Base) MCG/ACT inhaler Inhale 1-2 puffs into the lungs every 6 (six) hours as needed for wheezing or shortness of breath. 18 g 1  . alum hydroxide-mag trisilicate (GAVISCON) 80-20 MG CHEW chewable tablet Chew 1-2 tablets by mouth daily as needed for indigestion or heartburn.    Marland Kitchen. BREO ELLIPTA 200-25 MCG/INH AEPB INHALE 1 PUFF INTO THE LUNGS DAILY 60 each 3  . Carbinoxamine Maleate 4 MG TABS Take 1 tablet (4 mg total) by mouth every 6 (six) hours. 28 each 5  . flunisolide (NASALIDE) 25 MCG/ACT (0.025%) SOLN Place 2 sprays into the nose daily. 1 Bottle 0  . HYDROcodone-acetaminophen (NORCO) 10-325 MG tablet Take 1 tablet by mouth every 6 (six) hours as needed.    . hyoscyamine (LEVSIN SL) 0.125 MG SL tablet Take 0.125 mg by mouth 3 (three) times daily with meals.   0  . levocetirizine (XYZAL) 5 MG tablet TAKE 1 TABLET(5 MG) BY MOUTH EVERY EVENING 30 tablet 5  . meloxicam (MOBIC) 15 MG tablet     . methocarbamol (ROBAXIN) 500 MG tablet Take 500 mg by mouth 2 (two) times daily as needed for muscle spasms.     . montelukast (SINGULAIR) 10 MG tablet TAKE 1 TABLET(10 MG) BY MOUTH AT BEDTIME 30 tablet 5  . norethindrone-ethinyl estradiol (MICROGESTIN FE 1/20) 1-20 MG-MCG tablet Take 1 tablet by mouth daily. (Patient taking differently: Take 1 tablet by mouth  at bedtime. ) 3 Package 4  . olopatadine (PATANOL) 0.1 % ophthalmic solution Place 1 drop into both eyes daily as needed for allergies.     . Olopatadine HCl (PAZEO) 0.7 % SOLN Place 1 drop into both eyes 1 day or 1 dose. 1 Bottle 5  . QNASL 80 MCG/ACT AERS USE 1 SPRAY IN EACH NOSTRIL TWICE DAILY AS NEEDED FOR ALLERGIES 10.6 g 3  . traMADol (ULTRAM) 50 MG tablet Take by mouth every 6 (six) hours as needed.    . traZODone (DESYREL) 100 MG tablet Take 100 mg by mouth at bedtime.     . Vitamin D, Ergocalciferol, (DRISDOL) 1.25 MG (50000 UT) CAPS capsule Take 1 capsule (50,000 Units total) by mouth every 7 (seven) days. 4 capsule 0   No current facility-administered medications on file prior to visit.     PAST MEDICAL HISTORY: Past Medical History:  Diagnosis Date  . ADHD (attention deficit hyperactivity disorder)   . Allergic rhinitis   . Anxiety   . Asthma   .  Back injury    COMPETITIVE INJURY IN HIGH SCHOOL  . Back pain   . Depression   . Fatty liver   . Fibromyalgia   . Gallbladder problem   . GERD (gastroesophageal reflux disease)   . IBS (irritable bowel syndrome)   . Insulin resistance   . Joint pain   . Migraines   . OA (osteoarthritis) of knee   . Obesity   . Pneumonia   . PONV (postoperative nausea and vomiting)   . Seasonal allergies   . Vitamin D deficiency     PAST SURGICAL HISTORY: Past Surgical History:  Procedure Laterality Date  . CHOLECYSTECTOMY    . DG DILATION URETERS    . KNEE ARTHROSCOPY Left   . KNEE ARTHROSCOPY WITH LATERAL MENISECTOMY Right 04/11/2018   Procedure: RIGHT KNEE ARTHROSCOPY WITH PARTIAL MENISECTOMY CHONDROPLASTY;  Surgeon: Sydnee Cabal, MD;  Location: [facility];  Service: Orthopedics;  Laterality: Right;  . MOUTH SURGERY    . SHOULDER SURGERY  2002   RIGHT  . WISDOM TOOTH EXTRACTION    . WRIST ARTHROSCOPY     Right    SOCIAL HISTORY: Social History   Tobacco Use  . Smoking status: Never Smoker  . Smokeless tobacco: Never Used   Substance Use Topics  . Alcohol use: Yes    Comment: social  . Drug use: No    FAMILY HISTORY: Family History  Problem Relation Age of Onset  . Multiple sclerosis Mother   . Diabetes Mother   . Hyperlipidemia Mother   . Thyroid disease Mother   . Depression Mother   . Anxiety disorder Mother   . Obesity Mother   . Allergic rhinitis Mother   . Hypertension Father   . Diabetes Father   . Hyperlipidemia Father   . Obesity Father   . Allergic rhinitis Father   . Diabetes Maternal Grandfather   . Cancer Maternal Grandfather        prostate  . Heart disease Maternal Grandfather   . Heart disease Paternal Grandfather   . Allergic rhinitis Paternal Aunt   . Allergic rhinitis Maternal Grandmother   . Asthma Neg Hx   . Eczema Neg Hx   . Urticaria Neg Hx     ROS: Review of Systems  Constitutional: Negative for weight loss.  Psychiatric/Behavioral: Positive for depression. Negative for suicidal ideas.       + Anxiety    PHYSICAL EXAM: Pt in no acute distress  RECENT LABS AND TESTS: BMET    Component Value Date/Time   NA 142 04/08/2018 1420   NA 139 05/01/2017 1009   K 4.6 04/08/2018 1420   CL 107 04/08/2018 1420   CO2 25 04/08/2018 1420   GLUCOSE 87 04/08/2018 1420   BUN 12 04/08/2018 1420   BUN 10 05/01/2017 1009   CREATININE 0.77 04/08/2018 1420   CALCIUM 9.1 04/08/2018 1420   GFRNONAA >60 04/08/2018 1420   GFRAA >60 04/08/2018 1420   Lab Results  Component Value Date   HGBA1C 5.4 10/30/2018   HGBA1C 5.5 01/16/2018   HGBA1C 5.4 10/03/2017   HGBA1C 5.3 05/01/2017   Lab Results  Component Value Date   INSULIN 29.3 (H) 10/30/2018   INSULIN 12.9 01/16/2018   INSULIN 21.7 10/03/2017   INSULIN 11.1 05/01/2017   CBC    Component Value Date/Time   WBC 10.5 04/08/2018 1420   RBC 5.14 (H) 04/08/2018 1420   HGB 15.5 (H) 04/08/2018 1420   HGB 14.0 05/01/2017 1009  HCT 47.3 (H) 04/08/2018 1420   HCT 43.3 05/01/2017 1009   PLT 318 04/08/2018 1420   MCV  92.0 04/08/2018 1420   MCV 93 05/01/2017 1009   MCH 30.2 04/08/2018 1420   MCHC 32.8 04/08/2018 1420   RDW 13.7 04/08/2018 1420   RDW 13.7 05/01/2017 1009   LYMPHSABS 1.5 05/01/2017 1009   MONOABS 0.7 01/09/2012 0956   EOSABS 0.1 05/01/2017 1009   BASOSABS 0.0 05/01/2017 1009   Iron/TIBC/Ferritin/ %Sat No results found for: IRON, TIBC, FERRITIN, IRONPCTSAT Lipid Panel     Component Value Date/Time   CHOL 163 10/30/2018 1206   TRIG 71 10/30/2018 1206   HDL 44 10/30/2018 1206   CHOLHDL 3.4 01/09/2012 0956   VLDL 14 01/09/2012 0956   LDLCALC 105 (H) 10/30/2018 1206   Hepatic Function Panel     Component Value Date/Time   PROT 6.8 05/01/2017 1009   ALBUMIN 4.2 05/01/2017 1009   AST 15 05/01/2017 1009   ALT 14 05/01/2017 1009   ALKPHOS 61 05/01/2017 1009   BILITOT 0.3 05/01/2017 1009      Component Value Date/Time   TSH 1.640 05/01/2017 1009   TSH 1.850 01/09/2012 0956      I, Burt KnackSharon Martin, am acting as transcriptionist for Debbra RidingAlexandria Kadolph, MD  I have reviewed the above documentation for accuracy and completeness, and I agree with the above. - Debbra RidingAlexandria Kadolph, MD

## 2019-01-21 DIAGNOSIS — G4700 Insomnia, unspecified: Secondary | ICD-10-CM | POA: Diagnosis not present

## 2019-01-21 DIAGNOSIS — M62838 Other muscle spasm: Secondary | ICD-10-CM | POA: Diagnosis not present

## 2019-01-21 DIAGNOSIS — J4530 Mild persistent asthma, uncomplicated: Secondary | ICD-10-CM | POA: Diagnosis not present

## 2019-01-22 ENCOUNTER — Encounter (INDEPENDENT_AMBULATORY_CARE_PROVIDER_SITE_OTHER): Payer: Self-pay | Admitting: Family Medicine

## 2019-01-22 ENCOUNTER — Other Ambulatory Visit: Payer: Self-pay

## 2019-01-22 ENCOUNTER — Telehealth (INDEPENDENT_AMBULATORY_CARE_PROVIDER_SITE_OTHER): Payer: BC Managed Care – PPO | Admitting: Family Medicine

## 2019-01-22 DIAGNOSIS — E8881 Metabolic syndrome: Secondary | ICD-10-CM | POA: Diagnosis not present

## 2019-01-22 DIAGNOSIS — E7849 Other hyperlipidemia: Secondary | ICD-10-CM

## 2019-01-22 DIAGNOSIS — Z6841 Body Mass Index (BMI) 40.0 and over, adult: Secondary | ICD-10-CM | POA: Diagnosis not present

## 2019-01-22 MED ORDER — BD PEN NEEDLE NANO U/F 32G X 4 MM MISC: 1.0000 | Freq: Every day | 0 refills | Status: DC

## 2019-01-22 MED ORDER — LIRAGLUTIDE 18 MG/3ML ~~LOC~~ SOPN: 0.6000 mg | PEN_INJECTOR | Freq: Every morning | SUBCUTANEOUS | 0 refills | Status: DC

## 2019-01-27 NOTE — Progress Notes (Signed)
Office: [phone]  /  Fax: [phone] TeleHealth Visit:  Cynthia [name] has verbally consented to this TeleHealth visit today. The patient is located at home, the provider is located at the [facility] office. The participants in this visit include the listed provider and patient. The visit was conducted today via face time.  HPI:   Chief Complaint: OBESITY Cynthia [name] is here to discuss her progress with her obesity treatment plan. She is on the Category 2 plan and is following her eating plan approximately 80 % of the time. She states she is exercising 0 minutes 0 times per week. Cynthia [name] feels she is eating better, but she voices she is eating out quite a bit (eating out 4-5 times per week). She has been eating around 1400-1500 calories and working on protein.  We were unable to weigh the patient today for this TeleHealth visit. She feels as if she has maintained her weight since her last visit. She has lost 0 lbs since starting treatment with us.  Hyperlipidemia Cynthia [name] has hyperlipidemia and has been trying to improve her cholesterol levels with intensive lifestyle modification including a low saturated fat diet, exercise and weight loss. Last LDL was of 105 on 10/30/2018, and she is not on statin. She denies any chest pain, claudication or myalgias.  Insulin Resistance Cynthia [name] has a diagnosis of insulin resistance based on her elevated fasting insulin level >5. Last insulin was of 29.3 and Hgb A1c of 5.4. Although Marci's blood glucose readings are still under good control, insulin resistance puts her at greater risk of metabolic syndrome and diabetes. She is still doing frequent takeout. She is not taking metformin currently and continues to work on diet and exercise to decrease risk of diabetes.  ASSESSMENT AND PLAN:  Other hyperlipidemia  Insulin resistance - Plan: Insulin Pen Needle (BD PEN NEEDLE NANO U/F) 32G X 4 MM MISC  Class 3 severe obesity with serious comorbidity  and body mass index (BMI) of 50.0 to 59.9 in adult, unspecified obesity type (HCC)  PLAN:  Hyperlipidemia Cynthia [name] was informed of the American Heart Association Guidelines emphasizing intensive lifestyle modifications as the first line treatment for hyperlipidemia. We discussed many lifestyle modifications today in depth, and Cynthia [name] will continue to work on decreasing saturated fats such as fatty red meat, butter and many fried foods. She will also increase vegetables and lean protein in her diet and continue to work on exercise and weight loss efforts. We will repeat labs at the end of July. Cynthia [name] agrees to follow up with our clinic in 2 weeks.  Insulin Resistance Cynthia [name] will continue to work on weight loss, exercise, and decreasing simple carbohydrates in her diet to help decrease the risk of diabetes. We dicussed metformin including benefits and risks. She was informed that eating too many simple carbohydrates or too many calories at one sitting increases the likelihood of GI side effects. Cynthia [name] agrees to start Victoza 0.6 mg SubQ daily #1 pen with no refills. We will repeat labs at the end of July. Cynthia [name] agrees to follow up with our clinic in 2 weeks as directed to monitor her progress.  Obesity Cynthia [name] is currently in the action stage of change. As such, her goal is to continue with weight loss efforts She has agreed to keep a food journal with 1400-1500 calories and 90+ grams of protein daily or follow the Category 2 plan + 100 calories Cynthia [name] has been instructed to work up to a goal of 150 minutes of  combined cardio and strengthening exercise per week for weight loss and overall health benefits. We discussed the following Behavioral Modification Strategies today: increasing lean protein intake, increasing vegetables and work on meal planning and easy cooking plans, keeping healthy foods in the home, and planning for success   Cynthia [name] has agreed to follow up with our clinic in 2 weeks. She was  informed of the importance of frequent follow up visits to maximize her success with intensive lifestyle modifications for her multiple health conditions.  ALLERGIES: Allergies  Allergen Reactions  . Aquacel [Carboxymethylcellulose] Itching, Rash and Other (See Comments)    Caused blisters  . Penicillins     Unknown reaction Has patient had a PCN reaction causing immediate rash, facial/tongue/throat swelling, SOB or lightheadedness with hypotension: Unknown Has patient had a PCN reaction causing severe rash involving mucus membranes or skin necrosis: Unknown Has patient had a PCN reaction that required hospitalization: No Has patient had a PCN reaction occurring within the last 10 years: No If all of the above answers are "NO", then may proceed with Cephalosporin use.   . Adhesive [Tape] Rash    Prefers to use paper tape  . Latex Rash  . Oxycodone Itching and Rash    MEDICATIONS: Current Outpatient Medications on File Prior to Visit  Medication Sig Dispense Refill  . acetaminophen (TYLENOL) 500 MG tablet Take 1,000 mg by mouth 3 (three) times daily as needed for moderate pain.     Marland Kitchen. albuterol (PROAIR HFA) 108 (90 Base) MCG/ACT inhaler Inhale 1-2 puffs into the lungs every 6 (six) hours as needed for wheezing or shortness of breath. 18 g 1  . alum hydroxide-mag trisilicate (GAVISCON) 80-20 MG CHEW chewable tablet Chew 1-2 tablets by mouth daily as needed for indigestion or heartburn.    Marland Kitchen. BREO ELLIPTA 200-25 MCG/INH AEPB INHALE 1 PUFF INTO THE LUNGS DAILY 60 each 3  . Carbinoxamine Maleate 4 MG TABS Take 1 tablet (4 mg total) by mouth every 6 (six) hours. 28 each 5  . FLUoxetine (PROZAC) 10 MG capsule Take 1 capsule (10 mg total) by mouth as directed. Take three tabs by mouth daily 90 capsule 0  . hyoscyamine (LEVSIN SL) 0.125 MG SL tablet Take 0.125 mg by mouth 3 (three) times daily with meals.   0  . levocetirizine (XYZAL) 5 MG tablet TAKE 1 TABLET(5 MG) BY MOUTH EVERY EVENING 30  tablet 5  . meloxicam (MOBIC) 15 MG tablet     . methocarbamol (ROBAXIN) 500 MG tablet Take 500 mg by mouth 2 (two) times daily as needed for muscle spasms.     . montelukast (SINGULAIR) 10 MG tablet TAKE 1 TABLET(10 MG) BY MOUTH AT BEDTIME 30 tablet 5  . norethindrone-ethinyl estradiol (MICROGESTIN FE 1/20) 1-20 MG-MCG tablet Take 1 tablet by mouth daily. (Patient taking differently: Take 1 tablet by mouth at bedtime. ) 3 Package 4  . olopatadine (PATANOL) 0.1 % ophthalmic solution Place 1 drop into both eyes daily as needed for allergies.     . Olopatadine HCl (PAZEO) 0.7 % SOLN Place 1 drop into both eyes 1 day or 1 dose. 1 Bottle 5  . QNASL 80 MCG/ACT AERS USE 1 SPRAY IN EACH NOSTRIL TWICE DAILY AS NEEDED FOR ALLERGIES 10.6 g 3  . traMADol (ULTRAM) 50 MG tablet Take by mouth every 6 (six) hours as needed.    . traZODone (DESYREL) 100 MG tablet Take 100 mg by mouth at bedtime.     . Vitamin D,  Ergocalciferol, (DRISDOL) 1.25 MG (50000 UT) CAPS capsule Take 1 capsule (50,000 Units total) by mouth every 7 (seven) days. 4 capsule 0  . flunisolide (NASALIDE) 25 MCG/ACT (0.025%) SOLN Place 2 sprays into the nose daily. (Patient not taking: Reported on 01/22/2019) 1 Bottle 0  . HYDROcodone-acetaminophen (NORCO) 10-325 MG tablet Take 1 tablet by mouth every 6 (six) hours as needed.     No current facility-administered medications on file prior to visit.     PAST MEDICAL HISTORY: Past Medical History:  Diagnosis Date  . ADHD (attention deficit hyperactivity disorder)   . Allergic rhinitis   . Anxiety   . Asthma   . Back injury    COMPETITIVE INJURY IN HIGH SCHOOL  . Back pain   . Depression   . Fatty liver   . Fibromyalgia   . Gallbladder problem   . GERD (gastroesophageal reflux disease)   . IBS (irritable bowel syndrome)   . Insulin resistance   . Joint pain   . Migraines   . OA (osteoarthritis) of knee   . Obesity   . Pneumonia   . PONV (postoperative nausea and vomiting)   .  Seasonal allergies   . Vitamin D deficiency     PAST SURGICAL HISTORY: Past Surgical History:  Procedure Laterality Date  . CHOLECYSTECTOMY    . DG DILATION URETERS    . KNEE ARTHROSCOPY Left   . KNEE ARTHROSCOPY WITH LATERAL MENISECTOMY Right 04/11/2018   Procedure: RIGHT KNEE ARTHROSCOPY WITH PARTIAL MENISECTOMY CHONDROPLASTY;  Surgeon: Sydnee Cabal, MD;  Location: [facility];  Service: Orthopedics;  Laterality: Right;  . MOUTH SURGERY    . SHOULDER SURGERY  2002   RIGHT  . WISDOM TOOTH EXTRACTION    . WRIST ARTHROSCOPY     Right    SOCIAL HISTORY: Social History   Tobacco Use  . Smoking status: Never Smoker  . Smokeless tobacco: Never Used  Substance Use Topics  . Alcohol use: Yes    Comment: social  . Drug use: No    FAMILY HISTORY: Family History  Problem Relation Age of Onset  . Multiple sclerosis Mother   . Diabetes Mother   . Hyperlipidemia Mother   . Thyroid disease Mother   . Depression Mother   . Anxiety disorder Mother   . Obesity Mother   . Allergic rhinitis Mother   . Hypertension Father   . Diabetes Father   . Hyperlipidemia Father   . Obesity Father   . Allergic rhinitis Father   . Diabetes Maternal Grandfather   . Cancer Maternal Grandfather        prostate  . Heart disease Maternal Grandfather   . Heart disease Paternal Grandfather   . Allergic rhinitis Paternal Aunt   . Allergic rhinitis Maternal Grandmother   . Asthma Neg Hx   . Eczema Neg Hx   . Urticaria Neg Hx     ROS: Review of Systems  Constitutional: Negative for weight loss.  Cardiovascular: Negative for chest pain and claudication.  Musculoskeletal: Negative for myalgias.    PHYSICAL EXAM: Pt in no acute distress  RECENT LABS AND TESTS: BMET    Component Value Date/Time   NA 142 04/08/2018 1420   NA 139 05/01/2017 1009   K 4.6 04/08/2018 1420   CL 107 04/08/2018 1420   CO2 25 04/08/2018 1420   GLUCOSE 87 04/08/2018 1420   BUN 12 04/08/2018 1420   BUN 10  05/01/2017 1009   CREATININE 0.77 04/08/2018 1420  CALCIUM 9.1 04/08/2018 1420   GFRNONAA >60 04/08/2018 1420   GFRAA >60 04/08/2018 1420   Lab Results  Component Value Date   HGBA1C 5.4 10/30/2018   HGBA1C 5.5 01/16/2018   HGBA1C 5.4 10/03/2017   HGBA1C 5.3 05/01/2017   Lab Results  Component Value Date   INSULIN 29.3 (H) 10/30/2018   INSULIN 12.9 01/16/2018   INSULIN 21.7 10/03/2017   INSULIN 11.1 05/01/2017   CBC    Component Value Date/Time   WBC 10.5 04/08/2018 1420   RBC 5.14 (H) 04/08/2018 1420   HGB 15.5 (H) 04/08/2018 1420   HGB 14.0 05/01/2017 1009   HCT 47.3 (H) 04/08/2018 1420   HCT 43.3 05/01/2017 1009   PLT 318 04/08/2018 1420   MCV 92.0 04/08/2018 1420   MCV 93 05/01/2017 1009   MCH 30.2 04/08/2018 1420   MCHC 32.8 04/08/2018 1420   RDW 13.7 04/08/2018 1420   RDW 13.7 05/01/2017 1009   LYMPHSABS 1.5 05/01/2017 1009   MONOABS 0.7 01/09/2012 0956   EOSABS 0.1 05/01/2017 1009   BASOSABS 0.0 05/01/2017 1009   Iron/TIBC/Ferritin/ %Sat No results found for: IRON, TIBC, FERRITIN, IRONPCTSAT Lipid Panel     Component Value Date/Time   CHOL 163 10/30/2018 1206   TRIG 71 10/30/2018 1206   HDL 44 10/30/2018 1206   CHOLHDL 3.4 01/09/2012 0956   VLDL 14 01/09/2012 0956   LDLCALC 105 (H) 10/30/2018 1206   Hepatic Function Panel     Component Value Date/Time   PROT 6.8 05/01/2017 1009   ALBUMIN 4.2 05/01/2017 1009   AST 15 05/01/2017 1009   ALT 14 05/01/2017 1009   ALKPHOS 61 05/01/2017 1009   BILITOT 0.3 05/01/2017 1009      Component Value Date/Time   TSH 1.640 05/01/2017 1009   TSH 1.850 01/09/2012 0956      I, Burt KnackSharon Martin, am acting as transcriptionist for Debbra RidingAlexandria Kadolph, MD  I have reviewed the above documentation for accuracy and completeness, and I agree with the above. - Debbra RidingAlexandria Kadolph, MD

## 2019-02-04 DIAGNOSIS — M25561 Pain in right knee: Secondary | ICD-10-CM | POA: Diagnosis not present

## 2019-02-04 DIAGNOSIS — M1711 Unilateral primary osteoarthritis, right knee: Secondary | ICD-10-CM | POA: Diagnosis not present

## 2019-02-04 DIAGNOSIS — M1712 Unilateral primary osteoarthritis, left knee: Secondary | ICD-10-CM | POA: Diagnosis not present

## 2019-02-05 ENCOUNTER — Telehealth (INDEPENDENT_AMBULATORY_CARE_PROVIDER_SITE_OTHER): Payer: BC Managed Care – PPO | Admitting: Family Medicine

## 2019-02-05 ENCOUNTER — Encounter (INDEPENDENT_AMBULATORY_CARE_PROVIDER_SITE_OTHER): Payer: Self-pay | Admitting: Family Medicine

## 2019-02-05 ENCOUNTER — Other Ambulatory Visit: Payer: Self-pay

## 2019-02-05 DIAGNOSIS — E559 Vitamin D deficiency, unspecified: Secondary | ICD-10-CM | POA: Diagnosis not present

## 2019-02-05 DIAGNOSIS — E8881 Metabolic syndrome: Secondary | ICD-10-CM

## 2019-02-05 DIAGNOSIS — Z6841 Body Mass Index (BMI) 40.0 and over, adult: Secondary | ICD-10-CM

## 2019-02-05 DIAGNOSIS — F419 Anxiety disorder, unspecified: Secondary | ICD-10-CM

## 2019-02-05 MED ORDER — VITAMIN D (ERGOCALCIFEROL) 1.25 MG (50000 UNIT) PO CAPS: 50000.0000 [IU] | ORAL_CAPSULE | ORAL | 0 refills | Status: DC

## 2019-02-05 MED ORDER — FLUOXETINE HCL 10 MG PO CAPS: 10.0000 mg | ORAL_CAPSULE | ORAL | 0 refills | Status: DC

## 2019-02-05 MED ORDER — LIRAGLUTIDE 18 MG/3ML ~~LOC~~ SOPN: 0.6000 mg | PEN_INJECTOR | Freq: Every morning | SUBCUTANEOUS | 0 refills | Status: DC

## 2019-02-09 NOTE — Progress Notes (Signed)
Office: [phone]  /  Fax: [phone] TeleHealth Visit:  Cynthia MaffucciSarah C [name] has verbally consented to this TeleHealth visit today. The patient is located at home, the provider is located at the [facility] office. The participants in this visit include the listed provider and patient. The visit was conducted today via Facetime.  HPI:   Chief Complaint: OBESITY Cynthia [name] is here to discuss her progress with her obesity treatment plan. She is on the  follow the Category 2 plan and is following her eating plan approximately 90 % of the time. She states she is exercising by working on projects.  Cynthia [name] weighed 3 days ago and was down 4 lbs. She thinks she is down another 2 lbs. Her appetite has decreased quite a bit since starting Victoza.  We were unable to weigh the patient today for this TeleHealth visit. She feels as if she has lost weight since her last visit. She has lost 0 lbs since starting treatment with us.  Vitamin D deficiency Cynthia [name] has a diagnosis of vitamin D deficiency. She is currently taking vit D and denies nausea, vomiting or muscle weakness. She reports fatigue.   Insulin Resistance Cynthia [name] has a diagnosis of insulin resistance based on her elevated fasting insulin level >5. Although Cynthia [name]'s blood glucose readings are still under good control, insulin resistance puts her at greater risk of metabolic syndrome and diabetes. She is not taking metformin but she is taking Victoza currently and continues to work on diet and exercise to decrease risk of diabetes.  Anxiety Cynthia [name] has a diagnosis of anxiety. She reports her symptoms are better controlled and she is feeling better with medications.    ASSESSMENT AND PLAN:  Insulin resistance  Anxiety - Plan: FLUoxetine (PROZAC) 10 MG capsule  Vitamin D deficiency - Plan: Vitamin D, Ergocalciferol, (DRISDOL) 1.25 MG (50000 UT) CAPS capsule  Class 3 severe obesity with serious comorbidity and body mass index (BMI) of  50.0 to 59.9 in adult, unspecified obesity type (HCC)  PLAN: Vitamin D Deficiency Cynthia [name] was informed that low vitamin D levels contributes to fatigue and are associated with obesity, breast, and colon cancer. She agrees to continue to take prescription Vit D @50 ,000 IU every week #4 with no refills and will follow up for routine testing of vitamin D, at least 2-3 times per year. She was informed of the risk of over-replacement of vitamin D and agrees to not increase her dose unless she discusses this with us first. Agrees to follow up with our clinic as directed.   Insulin Resistance Cynthia [name] will continue to work on weight loss, exercise, and decreasing simple carbohydrates in her diet to help decrease the risk of diabetes. We dicussed metformin including benefits and risks. She was informed that eating too many simple carbohydrates or too many calories at one sitting increases the likelihood of GI side effects. Cynthia [name] agrees to continue Victoza 0.6 mg SubQ daily #1 pen with no refills. Cynthia [name] agreed to follow up with us as directed to monitor her progress.  Anxiety Cynthia [name] agrees to continue Prozac 10 mg 3 tablets daily #90 with no refills. Agrees to follow up with our clinic as directed.   Obesity Cynthia [name] is currently in the action stage of change. As such, her goal is to continue with weight loss efforts She has agreed to follow the Category 2 plan Cynthia [name] has been instructed to work up to a goal of 150 minutes of combined cardio and strengthening exercise per week for weight  loss and overall health benefits. We discussed the following Behavioral Modification Stratagies today:keeping healthy foods in the home, planning for success, better snacking choices,  increasing lean protein intake, increasing vegetables and work on meal planning and easy cooking plans   Cynthia [name] has agreed to follow up with our clinic in 2 weeks. She was informed of the importance of frequent follow up visits to maximize her success  with intensive lifestyle modifications for her multiple health conditions.  ALLERGIES: Allergies  Allergen Reactions  . Aquacel [Carboxymethylcellulose] Itching, Rash and Other (See Comments)    Caused blisters  . Penicillins     Unknown reaction Has patient had a PCN reaction causing immediate rash, facial/tongue/throat swelling, SOB or lightheadedness with hypotension: Unknown Has patient had a PCN reaction causing severe rash involving mucus membranes or skin necrosis: Unknown Has patient had a PCN reaction that required hospitalization: No Has patient had a PCN reaction occurring within the last 10 years: No If all of the above answers are "NO", then may proceed with Cephalosporin use.   . Adhesive [Tape] Rash    Prefers to use paper tape  . Latex Rash  . Oxycodone Itching and Rash    MEDICATIONS: Current Outpatient Medications on File Prior to Visit  Medication Sig Dispense Refill  . acetaminophen (TYLENOL) 500 MG tablet Take 1,000 mg by mouth 3 (three) times daily as needed for moderate pain.     Marland Kitchen. albuterol (PROAIR HFA) 108 (90 Base) MCG/ACT inhaler Inhale 1-2 puffs into the lungs every 6 (six) hours as needed for wheezing or shortness of breath. 18 g 1  . alum hydroxide-mag trisilicate (GAVISCON) 80-20 MG CHEW chewable tablet Chew 1-2 tablets by mouth daily as needed for indigestion or heartburn.    Marland Kitchen. BREO ELLIPTA 200-25 MCG/INH AEPB INHALE 1 PUFF INTO THE LUNGS DAILY 60 each 3  . Carbinoxamine Maleate 4 MG TABS Take 1 tablet (4 mg total) by mouth every 6 (six) hours. 28 each 5  . flunisolide (NASALIDE) 25 MCG/ACT (0.025%) SOLN Place 2 sprays into the nose daily. 1 Bottle 0  . hyoscyamine (LEVSIN SL) 0.125 MG SL tablet Take 0.125 mg by mouth 3 (three) times daily with meals.   0  . Insulin Pen Needle (BD PEN NEEDLE NANO U/F) 32G X 4 MM MISC 1 Device by Does not apply route daily. 100 each 0  . levocetirizine (XYZAL) 5 MG tablet TAKE 1 TABLET(5 MG) BY MOUTH EVERY EVENING 30  tablet 5  . meloxicam (MOBIC) 15 MG tablet     . montelukast (SINGULAIR) 10 MG tablet TAKE 1 TABLET(10 MG) BY MOUTH AT BEDTIME 30 tablet 5  . norethindrone-ethinyl estradiol (MICROGESTIN FE 1/20) 1-20 MG-MCG tablet Take 1 tablet by mouth daily. (Patient taking differently: Take 1 tablet by mouth at bedtime. ) 3 Package 4  . Olopatadine HCl (PAZEO) 0.7 % SOLN Place 1 drop into both eyes 1 day or 1 dose. 1 Bottle 5  . QNASL 80 MCG/ACT AERS USE 1 SPRAY IN EACH NOSTRIL TWICE DAILY AS NEEDED FOR ALLERGIES 10.6 g 3  . traMADol (ULTRAM) 50 MG tablet Take by mouth every 6 (six) hours as needed.    . traZODone (DESYREL) 100 MG tablet Take 100 mg by mouth at bedtime.     Marland Kitchen. HYDROcodone-acetaminophen (NORCO) 10-325 MG tablet Take 1 tablet by mouth every 6 (six) hours as needed.    . methocarbamol (ROBAXIN) 500 MG tablet Take 500 mg by mouth 2 (two) times daily as needed for  muscle spasms.     Marland Kitchen. olopatadine (PATANOL) 0.1 % ophthalmic solution Place 1 drop into both eyes daily as needed for allergies.      No current facility-administered medications on file prior to visit.     PAST MEDICAL HISTORY: Past Medical History:  Diagnosis Date  . ADHD (attention deficit hyperactivity disorder)   . Allergic rhinitis   . Anxiety   . Asthma   . Back injury    COMPETITIVE INJURY IN HIGH SCHOOL  . Back pain   . Depression   . Fatty liver   . Fibromyalgia   . Gallbladder problem   . GERD (gastroesophageal reflux disease)   . IBS (irritable bowel syndrome)   . Insulin resistance   . Joint pain   . Migraines   . OA (osteoarthritis) of knee   . Obesity   . Pneumonia   . PONV (postoperative nausea and vomiting)   . Seasonal allergies   . Vitamin D deficiency     PAST SURGICAL HISTORY: Past Surgical History:  Procedure Laterality Date  . CHOLECYSTECTOMY    . DG DILATION URETERS    . KNEE ARTHROSCOPY Left   . KNEE ARTHROSCOPY WITH LATERAL MENISECTOMY Right 04/11/2018   Procedure: RIGHT KNEE  ARTHROSCOPY WITH PARTIAL MENISECTOMY CHONDROPLASTY;  Surgeon: Eugenia Mcalpineollins, Robert, MD;  Location: [facility];  Service: Orthopedics;  Laterality: Right;  . MOUTH SURGERY    . SHOULDER SURGERY  2002   RIGHT  . WISDOM TOOTH EXTRACTION    . WRIST ARTHROSCOPY     Right    SOCIAL HISTORY: Social History   Tobacco Use  . Smoking status: Never Smoker  . Smokeless tobacco: Never Used  Substance Use Topics  . Alcohol use: Yes    Comment: social  . Drug use: No    FAMILY HISTORY: Family History  Problem Relation Age of Onset  . Multiple sclerosis Mother   . Diabetes Mother   . Hyperlipidemia Mother   . Thyroid disease Mother   . Depression Mother   . Anxiety disorder Mother   . Obesity Mother   . Allergic rhinitis Mother   . Hypertension Father   . Diabetes Father   . Hyperlipidemia Father   . Obesity Father   . Allergic rhinitis Father   . Diabetes Maternal Grandfather   . Cancer Maternal Grandfather        prostate  . Heart disease Maternal Grandfather   . Heart disease Paternal Grandfather   . Allergic rhinitis Paternal Aunt   . Allergic rhinitis Maternal Grandmother   . Asthma Neg Hx   . Eczema Neg Hx   . Urticaria Neg Hx     ROS: Review of Systems  Gastrointestinal: Negative for nausea and vomiting.  Musculoskeletal:       Negative for muscle weakness  Psychiatric/Behavioral: The patient is nervous/anxious.     PHYSICAL EXAM: Pt in no acute distress  RECENT LABS AND TESTS: BMET    Component Value Date/Time   NA 142 04/08/2018 1420   NA 139 05/01/2017 1009   K 4.6 04/08/2018 1420   CL 107 04/08/2018 1420   CO2 25 04/08/2018 1420   GLUCOSE 87 04/08/2018 1420   BUN 12 04/08/2018 1420   BUN 10 05/01/2017 1009   CREATININE 0.77 04/08/2018 1420   CALCIUM 9.1 04/08/2018 1420   GFRNONAA >60 04/08/2018 1420   GFRAA >60 04/08/2018 1420   Lab Results  Component Value Date   HGBA1C 5.4 10/30/2018   HGBA1C 5.5  01/16/2018   HGBA1C 5.4 10/03/2017   HGBA1C 5.3  05/01/2017   Lab Results  Component Value Date   INSULIN 29.3 (H) 10/30/2018   INSULIN 12.9 01/16/2018   INSULIN 21.7 10/03/2017   INSULIN 11.1 05/01/2017   CBC    Component Value Date/Time   WBC 10.5 04/08/2018 1420   RBC 5.14 (H) 04/08/2018 1420   HGB 15.5 (H) 04/08/2018 1420   HGB 14.0 05/01/2017 1009   HCT 47.3 (H) 04/08/2018 1420   HCT 43.3 05/01/2017 1009   PLT 318 04/08/2018 1420   MCV 92.0 04/08/2018 1420   MCV 93 05/01/2017 1009   MCH 30.2 04/08/2018 1420   MCHC 32.8 04/08/2018 1420   RDW 13.7 04/08/2018 1420   RDW 13.7 05/01/2017 1009   LYMPHSABS 1.5 05/01/2017 1009   MONOABS 0.7 01/09/2012 0956   EOSABS 0.1 05/01/2017 1009   BASOSABS 0.0 05/01/2017 1009   Iron/TIBC/Ferritin/ %Sat No results found for: IRON, TIBC, FERRITIN, IRONPCTSAT Lipid Panel     Component Value Date/Time   CHOL 163 10/30/2018 1206   TRIG 71 10/30/2018 1206   HDL 44 10/30/2018 1206   CHOLHDL 3.4 01/09/2012 0956   VLDL 14 01/09/2012 0956   LDLCALC 105 (H) 10/30/2018 1206   Hepatic Function Panel     Component Value Date/Time   PROT 6.8 05/01/2017 1009   ALBUMIN 4.2 05/01/2017 1009   AST 15 05/01/2017 1009   ALT 14 05/01/2017 1009   ALKPHOS 61 05/01/2017 1009   BILITOT 0.3 05/01/2017 1009      Component Value Date/Time   TSH 1.640 05/01/2017 1009   TSH 1.850 01/09/2012 0956     Ref. Range 10/30/2018 12:06  Vitamin D, 25-Hydroxy Latest Ref Range: 30.0 - 100.0 ng/mL 32.9     I, Renee Ramus, am acting as Location manager for Ilene Qua, MD    I have reviewed the above documentation for accuracy and completeness, and I agree with the above. - Ilene Qua, MD

## 2019-02-19 ENCOUNTER — Other Ambulatory Visit: Payer: Self-pay

## 2019-02-19 ENCOUNTER — Encounter (INDEPENDENT_AMBULATORY_CARE_PROVIDER_SITE_OTHER): Payer: Self-pay | Admitting: Family Medicine

## 2019-02-19 ENCOUNTER — Telehealth (INDEPENDENT_AMBULATORY_CARE_PROVIDER_SITE_OTHER): Payer: BC Managed Care – PPO | Admitting: Family Medicine

## 2019-02-19 DIAGNOSIS — E559 Vitamin D deficiency, unspecified: Secondary | ICD-10-CM

## 2019-02-19 DIAGNOSIS — E8881 Metabolic syndrome: Secondary | ICD-10-CM | POA: Diagnosis not present

## 2019-02-19 DIAGNOSIS — Z6841 Body Mass Index (BMI) 40.0 and over, adult: Secondary | ICD-10-CM

## 2019-02-22 NOTE — Progress Notes (Signed)
Office: [phone]  /  Fax: [phone] TeleHealth Visit:  Cynthia [name] has verbally consented to this TeleHealth visit today. The patient is located at home, the provider is located at the [facility] office. The participants in this visit include the listed provider and patient. The visit was conducted today via face time.  HPI:   Chief Complaint: OBESITY Cynthia [name] is here to discuss her progress with her obesity treatment plan. She is on the Category 2 plan and is following her eating plan approximately 80 % of the time. She states she is exercising 0 minutes 0 times per week. Cynthia [name]'s weight is of 275 lbs today (she reports 2 lb weight loss from last appointment). She is blending together all of the foods into 1-2 meals. She is starting to get a bit hungry even with Victoza.  We were unable to weigh the patient today for this TeleHealth visit. She feels as if she has lost 2 lbs since her last visit. She has lost 0 lbs since starting treatment with [place].  Insulin Resistance Cynthia [name] has a diagnosis of insulin resistance based on her elevated fasting insulin level >5. Although Cynthia [name]'s blood glucose readings are still under good control, insulin resistance puts her at greater risk of metabolic syndrome and diabetes. She is on Victoza 0.6 mg and is tolerating it. She is noticing some increase in hunger. She continues to work on diet and exercise to decrease risk of diabetes.  Vitamin D Deficiency Cynthia [name] has a diagnosis of vitamin D deficiency. She is currently taking prescription Vit D. She notes fatigue and denies nausea, vomiting or muscle weakness.  ASSESSMENT AND PLAN:  Insulin resistance  Vitamin D deficiency  Class 3 severe obesity with serious comorbidity and body mass index (BMI) of 45.0 to 49.9 in adult, unspecified obesity type ([place])  PLAN:  Insulin Resistance Cynthia [name] will continue to work on weight loss, exercise, and decreasing simple carbohydrates in her diet to  help decrease the risk of diabetes. We dicussed metformin including benefits and risks. She was informed that eating too many simple carbohydrates or too many calories at one sitting increases the likelihood of GI side effects. [name] agrees to increase Victoza to 0.9 mg SubQ daily, no refill needed. Cynthia [name] agrees to follow up with our clinic in 3 weeks as directed to monitor her progress.  Vitamin D Deficiency Cynthia [name] was informed that low vitamin D levels contributes to fatigue and are associated with obesity, breast, and colon cancer. Rache agrees to continue taking prescription Vit D 50,000 IU every week and will follow up for routine testing of vitamin D, at least 2-3 times per year. She was informed of the risk of over-replacement of vitamin D and agrees to not increase her dose unless she discusses this with [place] first. Cynthia [name] agrees to follow up with our clinic in 3 weeks.  Obesity Cynthia [name] is currently in the action stage of change. As such, her goal is to continue with weight loss efforts She has agreed to follow the Category 2 plan Cynthia [name] has been instructed to work up to a goal of 150 minutes of combined cardio and strengthening exercise per week for weight loss and overall health benefits. We discussed the following Behavioral Modification Strategies today: increasing lean protein intake, increasing vegetables and work on meal planning and easy cooking plans, keeping healthy foods in the home, and planning for success   Cynthia [name] has agreed to follow up with our clinic in 3 weeks. She was informed  of the importance of frequent follow up visits to maximize her success with intensive lifestyle modifications for her multiple health conditions.  ALLERGIES: Allergies  Allergen Reactions  . Aquacel [Carboxymethylcellulose] Itching, Rash and Other (See Comments)    Caused blisters  . Penicillins     Unknown reaction Has patient had a PCN reaction causing immediate rash, facial/tongue/throat swelling,  SOB or lightheadedness with hypotension: Unknown Has patient had a PCN reaction causing severe rash involving mucus membranes or skin necrosis: Unknown Has patient had a PCN reaction that required hospitalization: No Has patient had a PCN reaction occurring within the last 10 years: No If all of the above answers are "NO", then may proceed with Cephalosporin use.   . Adhesive [Tape] Rash    Prefers to use paper tape  . Latex Rash  . Oxycodone Itching and Rash    MEDICATIONS: Current Outpatient Medications on File Prior to Visit  Medication Sig Dispense Refill  . acetaminophen (TYLENOL) 500 MG tablet Take 1,000 mg by mouth 3 (three) times daily as needed for moderate pain.     Marland Kitchen. albuterol (PROAIR HFA) 108 (90 Base) MCG/ACT inhaler Inhale 1-2 puffs into the lungs every 6 (six) hours as needed for wheezing or shortness of breath. 18 g 1  . alum hydroxide-mag trisilicate (GAVISCON) 80-20 MG CHEW chewable tablet Chew 1-2 tablets by mouth daily as needed for indigestion or heartburn.    Marland Kitchen. BREO ELLIPTA 200-25 MCG/INH AEPB INHALE 1 PUFF INTO THE LUNGS DAILY 60 each 3  . Carbinoxamine Maleate 4 MG TABS Take 1 tablet (4 mg total) by mouth every 6 (six) hours. 28 each 5  . flunisolide (NASALIDE) 25 MCG/ACT (0.025%) SOLN Place 2 sprays into the nose daily. 1 Bottle 0  . FLUoxetine (PROZAC) 10 MG capsule Take 1 capsule (10 mg total) by mouth as directed. Take three tabs by mouth daily 90 capsule 0  . HYDROcodone-acetaminophen (NORCO) 10-325 MG tablet Take 1 tablet by mouth every 6 (six) hours as needed.    . hyoscyamine (LEVSIN SL) 0.125 MG SL tablet Take 0.125 mg by mouth 3 (three) times daily with meals.   0  . Insulin Pen Needle (BD PEN NEEDLE NANO U/F) 32G X 4 MM MISC 1 Device by Does not apply route daily. 100 each 0  . levocetirizine (XYZAL) 5 MG tablet TAKE 1 TABLET(5 MG) BY MOUTH EVERY EVENING 30 tablet 5  . liraglutide (VICTOZA) 18 MG/3ML SOPN Inject 0.1 mLs (0.6 mg total) into the skin every  morning. 1 pen 0  . meloxicam (MOBIC) 15 MG tablet     . methocarbamol (ROBAXIN) 500 MG tablet Take 500 mg by mouth 2 (two) times daily as needed for muscle spasms.     . montelukast (SINGULAIR) 10 MG tablet TAKE 1 TABLET(10 MG) BY MOUTH AT BEDTIME 30 tablet 5  . norethindrone-ethinyl estradiol (MICROGESTIN FE 1/20) 1-20 MG-MCG tablet Take 1 tablet by mouth daily. (Patient taking differently: Take 1 tablet by mouth at bedtime. ) 3 Package 4  . olopatadine (PATANOL) 0.1 % ophthalmic solution Place 1 drop into both eyes daily as needed for allergies.     . Olopatadine HCl (PAZEO) 0.7 % SOLN Place 1 drop into both eyes 1 day or 1 dose. 1 Bottle 5  . QNASL 80 MCG/ACT AERS USE 1 SPRAY IN EACH NOSTRIL TWICE DAILY AS NEEDED FOR ALLERGIES 10.6 g 3  . traMADol (ULTRAM) 50 MG tablet Take by mouth every 6 (six) hours as needed.    .Marland Kitchen  traZODone (DESYREL) 100 MG tablet Take 100 mg by mouth at bedtime.     . Vitamin D, Ergocalciferol, (DRISDOL) 1.25 MG (50000 UT) CAPS capsule Take 1 capsule (50,000 Units total) by mouth every 7 (seven) days. 4 capsule 0   No current facility-administered medications on file prior to visit.     PAST MEDICAL HISTORY: Past Medical History:  Diagnosis Date  . ADHD (attention deficit hyperactivity disorder)   . Allergic rhinitis   . Anxiety   . Asthma   . Back injury    COMPETITIVE INJURY IN HIGH SCHOOL  . Back pain   . Depression   . Fatty liver   . Fibromyalgia   . Gallbladder problem   . GERD (gastroesophageal reflux disease)   . IBS (irritable bowel syndrome)   . Insulin resistance   . Joint pain   . Migraines   . OA (osteoarthritis) of knee   . Obesity   . Pneumonia   . PONV (postoperative nausea and vomiting)   . Seasonal allergies   . Vitamin D deficiency     PAST SURGICAL HISTORY: Past Surgical History:  Procedure Laterality Date  . CHOLECYSTECTOMY    . DG DILATION URETERS    . KNEE ARTHROSCOPY Left   . KNEE ARTHROSCOPY WITH LATERAL MENISECTOMY  Right 04/11/2018   Procedure: RIGHT KNEE ARTHROSCOPY WITH PARTIAL MENISECTOMY CHONDROPLASTY;  Surgeon: Eugenia Mcalpineollins, Robert, MD;  Location: [facility];  Service: Orthopedics;  Laterality: Right;  . MOUTH SURGERY    . SHOULDER SURGERY  2002   RIGHT  . WISDOM TOOTH EXTRACTION    . WRIST ARTHROSCOPY     Right    SOCIAL HISTORY: Social History   Tobacco Use  . Smoking status: Never Smoker  . Smokeless tobacco: Never Used  Substance Use Topics  . Alcohol use: Yes    Comment: social  . Drug use: No    FAMILY HISTORY: Family History  Problem Relation Age of Onset  . Multiple sclerosis Mother   . Diabetes Mother   . Hyperlipidemia Mother   . Thyroid disease Mother   . Depression Mother   . Anxiety disorder Mother   . Obesity Mother   . Allergic rhinitis Mother   . Hypertension Father   . Diabetes Father   . Hyperlipidemia Father   . Obesity Father   . Allergic rhinitis Father   . Diabetes Maternal Grandfather   . Cancer Maternal Grandfather        prostate  . Heart disease Maternal Grandfather   . Heart disease Paternal Grandfather   . Allergic rhinitis Paternal Aunt   . Allergic rhinitis Maternal Grandmother   . Asthma Neg Hx   . Eczema Neg Hx   . Urticaria Neg Hx     ROS: Review of Systems  Constitutional: Positive for malaise/fatigue and weight loss.  Gastrointestinal: Negative for nausea and vomiting.  Musculoskeletal:       Negative muscle weakness    PHYSICAL EXAM: Pt in no acute distress  RECENT LABS AND TESTS: BMET    Component Value Date/Time   NA 142 04/08/2018 1420   NA 139 05/01/2017 1009   K 4.6 04/08/2018 1420   CL 107 04/08/2018 1420   CO2 25 04/08/2018 1420   GLUCOSE 87 04/08/2018 1420   BUN 12 04/08/2018 1420   BUN 10 05/01/2017 1009   CREATININE 0.77 04/08/2018 1420   CALCIUM 9.1 04/08/2018 1420   GFRNONAA >60 04/08/2018 1420   GFRAA >60 04/08/2018 1420  Lab Results  Component Value Date   HGBA1C 5.4 10/30/2018   HGBA1C 5.5 01/16/2018    HGBA1C 5.4 10/03/2017   HGBA1C 5.3 05/01/2017   Lab Results  Component Value Date   INSULIN 29.3 (H) 10/30/2018   INSULIN 12.9 01/16/2018   INSULIN 21.7 10/03/2017   INSULIN 11.1 05/01/2017   CBC    Component Value Date/Time   WBC 10.5 04/08/2018 1420   RBC 5.14 (H) 04/08/2018 1420   HGB 15.5 (H) 04/08/2018 1420   HGB 14.0 05/01/2017 1009   HCT 47.3 (H) 04/08/2018 1420   HCT 43.3 05/01/2017 1009   PLT 318 04/08/2018 1420   MCV 92.0 04/08/2018 1420   MCV 93 05/01/2017 1009   MCH 30.2 04/08/2018 1420   MCHC 32.8 04/08/2018 1420   RDW 13.7 04/08/2018 1420   RDW 13.7 05/01/2017 1009   LYMPHSABS 1.5 05/01/2017 1009   MONOABS 0.7 01/09/2012 0956   EOSABS 0.1 05/01/2017 1009   BASOSABS 0.0 05/01/2017 1009   Iron/TIBC/Ferritin/ %Sat No results found for: IRON, TIBC, FERRITIN, IRONPCTSAT Lipid Panel     Component Value Date/Time   CHOL 163 10/30/2018 1206   TRIG 71 10/30/2018 1206   HDL 44 10/30/2018 1206   CHOLHDL 3.4 01/09/2012 0956   VLDL 14 01/09/2012 0956   LDLCALC 105 (H) 10/30/2018 1206   Hepatic Function Panel     Component Value Date/Time   PROT 6.8 05/01/2017 1009   ALBUMIN 4.2 05/01/2017 1009   AST 15 05/01/2017 1009   ALT 14 05/01/2017 1009   ALKPHOS 61 05/01/2017 1009   BILITOT 0.3 05/01/2017 1009      Component Value Date/Time   TSH 1.640 05/01/2017 1009   TSH 1.850 01/09/2012 0956      I, Burt KnackSharon Martin, am acting as transcriptionist for Debbra RidingAlexandria Kadolph, MD   I have reviewed the above documentation for accuracy and completeness, and I agree with the above. - Debbra RidingAlexandria Kadolph, MD

## 2019-03-03 DIAGNOSIS — M1711 Unilateral primary osteoarthritis, right knee: Secondary | ICD-10-CM | POA: Diagnosis not present

## 2019-03-06 ENCOUNTER — Other Ambulatory Visit: Payer: Self-pay | Admitting: Allergy and Immunology

## 2019-03-10 DIAGNOSIS — M1711 Unilateral primary osteoarthritis, right knee: Secondary | ICD-10-CM | POA: Diagnosis not present

## 2019-03-11 ENCOUNTER — Encounter (INDEPENDENT_AMBULATORY_CARE_PROVIDER_SITE_OTHER): Payer: Self-pay | Admitting: Family Medicine

## 2019-03-11 ENCOUNTER — Other Ambulatory Visit: Payer: Self-pay

## 2019-03-11 ENCOUNTER — Ambulatory Visit (INDEPENDENT_AMBULATORY_CARE_PROVIDER_SITE_OTHER): Payer: BC Managed Care – PPO | Admitting: Family Medicine

## 2019-03-11 DIAGNOSIS — R7303 Prediabetes: Secondary | ICD-10-CM | POA: Diagnosis not present

## 2019-03-11 DIAGNOSIS — Z6841 Body Mass Index (BMI) 40.0 and over, adult: Secondary | ICD-10-CM

## 2019-03-11 DIAGNOSIS — F419 Anxiety disorder, unspecified: Secondary | ICD-10-CM | POA: Diagnosis not present

## 2019-03-11 DIAGNOSIS — E559 Vitamin D deficiency, unspecified: Secondary | ICD-10-CM | POA: Diagnosis not present

## 2019-03-11 MED ORDER — LIRAGLUTIDE 18 MG/3ML ~~LOC~~ SOPN: 0.9000 mg | PEN_INJECTOR | Freq: Every morning | SUBCUTANEOUS | 0 refills | Status: DC

## 2019-03-11 MED ORDER — FLUOXETINE HCL 10 MG PO CAPS: 10.0000 mg | ORAL_CAPSULE | ORAL | 0 refills | Status: DC

## 2019-03-11 MED ORDER — VITAMIN D (ERGOCALCIFEROL) 1.25 MG (50000 UNIT) PO CAPS: 50000.0000 [IU] | ORAL_CAPSULE | ORAL | 0 refills | Status: DC

## 2019-03-17 DIAGNOSIS — M1711 Unilateral primary osteoarthritis, right knee: Secondary | ICD-10-CM | POA: Diagnosis not present

## 2019-03-17 NOTE — Progress Notes (Signed)
Office: [phone]  /  Fax: [phone] TeleHealth Visit:  Cynthia [name] has verbally consented to this TeleHealth visit today. The patient is located at home, the provider is located at the [facility] office. The participants in this visit include the listed provider and patient. The visit was conducted today via face time.  HPI:   Chief Complaint: OBESITY Cynthia [name] is here to discuss her progress with her obesity treatment plan. She is on the Category 2 plan and is following her eating plan approximately 60-70 % of the time. She states she is exercising 0 minutes 0 times per week. Cynthia [name] had a good few weeks. She did have out of town company, which she did indulgent eating but still lost 1.5-2 lbs (2 lbs down to 273 lbs from 275 lbs). Her [birth year] is tomorrow.  We were unable to weigh the patient today for this TeleHealth visit. She feels as if she has lost weight since her last visit. She has lost 0 lbs since starting treatment with [place].  Pre-Diabetes Cynthia [name] has a diagnosis of pre-diabetes based on her elevated Hgb A1c and was informed this puts her at greater risk of developing diabetes. Last insulin level was of 29.3. She denies GI side effects of Victoza and continues to work on diet and exercise to decrease risk of diabetes. She denies hypoglycemia.  Anxiety Cynthia [name] voices that she is feeling better on Prozac 30 mg. She notes her symptoms are well controlled.  Vitamin D Deficiency Cynthia [name] has a diagnosis of vitamin D deficiency. She is currently taking prescription Vit D. She notes fatigue and denies nausea, vomiting or muscle weakness.  ASSESSMENT AND PLAN:  Prediabetes - Plan: liraglutide (VICTOZA) 18 MG/3ML SOPN  Anxiety - Plan: FLUoxetine (PROZAC) 10 MG capsule  Vitamin D deficiency - Plan: Vitamin D, Ergocalciferol, (DRISDOL) 1.25 MG (50000 UT) CAPS capsule  Class 3 severe obesity with serious comorbidity and body mass index (BMI) of 50.0 to 59.9 in adult,  unspecified obesity type [facility])  PLAN:  Pre-Diabetes Cynthia [name] will continue to work on weight loss, exercise, and decreasing simple carbohydrates in her diet to help decrease the risk of diabetes. We dicussed metformin including benefits and risks. She was informed that eating too many simple carbohydrates or too many calories at one sitting increases the likelihood of GI side effects. Cynthia [name] agrees to continue Victoza 0.9 mg SubQ daily #1 pen and we will refill for 1 month. Cynthia [name] agrees to follow up with our clinic in 2 weeks as directed to monitor her progress.  Anxiety Cynthia [name] agrees to continue taking Prozac 30 mg PO daily #90 supply with no refills. Cynthia [name] agrees to follow up with our clinic in 2 weeks.  Vitamin D Deficiency Cynthia [name] was informed that low vitamin D levels contributes to fatigue and are associated with obesity, breast, and colon cancer. Cynthia [name] agrees to continue taking prescription Vit D 50,000 IU every week #4 and we will refill for 1 month. She will follow up for routine testing of vitamin D, at least 2-3 times per year. She was informed of the risk of over-replacement of vitamin D and agrees to not increase her dose unless she discusses this with [place] first. Cynthia [name] agrees to follow up with our clinic in 2 weeks.  Obesity Cynthia [name] is currently in the action stage of change. As such, her goal is to continue with weight loss efforts She has agreed to keep a food journal with 1150-1300 calories and 80+ grams of protein daily or  follow the Category 2 plan Cynthia [name] has been instructed to work up to a goal of 150 minutes of combined cardio and strengthening exercise per week for weight loss and overall health benefits. We discussed the following Behavioral Modification Strategies today: increasing lean protein intake, increasing vegetables and work on meal planning and easy cooking plans, keeping healthy foods in the home, and planning for success   Cynthia [name] has agreed to follow up with our clinic in 2  weeks. She was informed of the importance of frequent follow up visits to maximize her success with intensive lifestyle modifications for her multiple health conditions.  ALLERGIES: Allergies  Allergen Reactions   Aquacel [Carboxymethylcellulose] Itching, Rash and Other (See Comments)    Caused blisters   Penicillins     Unknown reaction Has patient had a PCN reaction causing immediate rash, facial/tongue/throat swelling, SOB or lightheadedness with hypotension: Unknown Has patient had a PCN reaction causing severe rash involving mucus membranes or skin necrosis: Unknown Has patient had a PCN reaction that required hospitalization: No Has patient had a PCN reaction occurring within the last 10 years: No If all of the above answers are "NO", then may proceed with Cephalosporin use.    Adhesive [Tape] Rash    Prefers to use paper tape   Latex Rash   Oxycodone Itching and Rash    MEDICATIONS: Current Outpatient Medications on File Prior to Visit  Medication Sig Dispense Refill   acetaminophen (TYLENOL) 500 MG tablet Take 1,000 mg by mouth 3 (three) times daily as needed for moderate pain.      albuterol (PROAIR HFA) 108 (90 Base) MCG/ACT inhaler Inhale 1-2 puffs into the lungs every 6 (six) hours as needed for wheezing or shortness of breath. 18 g 1   alum hydroxide-mag trisilicate (GAVISCON) 80-20 MG CHEW chewable tablet Chew 1-2 tablets by mouth daily as needed for indigestion or heartburn.     BREO ELLIPTA 200-25 MCG/INH AEPB INHALE 1 PUFF INTO THE LUNGS DAILY 60 each 0   Carbinoxamine Maleate 4 MG TABS Take 1 tablet (4 mg total) by mouth every 6 (six) hours. 28 each 5   flunisolide (NASALIDE) 25 MCG/ACT (0.025%) SOLN Place 2 sprays into the nose daily. 1 Bottle 0   hyoscyamine (LEVSIN SL) 0.125 MG SL tablet Take 0.125 mg by mouth 3 (three) times daily with meals.   0   Insulin Pen Needle (BD PEN NEEDLE NANO U/F) 32G X 4 MM MISC 1 Device by Does not apply route daily. 100  each 0   levocetirizine (XYZAL) 5 MG tablet TAKE 1 TABLET(5 MG) BY MOUTH EVERY EVENING 30 tablet 5   meloxicam (MOBIC) 15 MG tablet      methocarbamol (ROBAXIN) 500 MG tablet Take 500 mg by mouth 2 (two) times daily as needed for muscle spasms.      montelukast (SINGULAIR) 10 MG tablet TAKE 1 TABLET(10 MG) BY MOUTH AT BEDTIME 30 tablet 5   norethindrone-ethinyl estradiol (MICROGESTIN FE 1/20) 1-20 MG-MCG tablet Take 1 tablet by mouth daily. (Patient taking differently: Take 1 tablet by mouth at bedtime. ) 3 Package 4   olopatadine (PATANOL) 0.1 % ophthalmic solution Place 1 drop into both eyes daily as needed for allergies.      Olopatadine HCl (PAZEO) 0.7 % SOLN Place 1 drop into both eyes 1 day or 1 dose. 1 Bottle 5   QNASL 80 MCG/ACT AERS USE 1 SPRAY IN EACH NOSTRIL TWICE DAILY AS NEEDED FOR ALLERGIES 10.6 g 3  traZODone (DESYREL) 100 MG tablet Take 100 mg by mouth at bedtime.      HYDROcodone-acetaminophen (NORCO) 10-325 MG tablet Take 1 tablet by mouth every 6 (six) hours as needed.     traMADol (ULTRAM) 50 MG tablet Take by mouth every 6 (six) hours as needed.     No current facility-administered medications on file prior to visit.     PAST MEDICAL HISTORY: Past Medical History:  Diagnosis Date   ADHD (attention deficit hyperactivity disorder)    Allergic rhinitis    Anxiety    Asthma    Back injury    COMPETITIVE INJURY IN HIGH SCHOOL   Back pain    Depression    Fatty liver    Fibromyalgia    Gallbladder problem    GERD (gastroesophageal reflux disease)    IBS (irritable bowel syndrome)    Insulin resistance    Joint pain    Migraines    OA (osteoarthritis) of knee    Obesity    Pneumonia    PONV (postoperative nausea and vomiting)    Seasonal allergies    Vitamin D deficiency     PAST SURGICAL HISTORY: Past Surgical History:  Procedure Laterality Date   CHOLECYSTECTOMY     DG DILATION URETERS     KNEE ARTHROSCOPY Left     KNEE ARTHROSCOPY WITH LATERAL MENISECTOMY Right 04/11/2018   Procedure: RIGHT KNEE ARTHROSCOPY WITH PARTIAL MENISECTOMY CHONDROPLASTY;  Surgeon: Eugenia Mcalpine, MD;  Location: [facility];  Service: Orthopedics;  Laterality: Right;   MOUTH SURGERY     SHOULDER SURGERY  2002   RIGHT   WISDOM TOOTH EXTRACTION     WRIST ARTHROSCOPY     Right    SOCIAL HISTORY: Social History   Tobacco Use   Smoking status: Never Smoker   Smokeless tobacco: Never Used  Substance Use Topics   Alcohol use: Yes    Comment: social   Drug use: No    FAMILY HISTORY: Family History  Problem Relation Age of Onset   Multiple sclerosis Mother    Diabetes Mother    Hyperlipidemia Mother    Thyroid disease Mother    Depression Mother    Anxiety disorder Mother    Obesity Mother    Allergic rhinitis Mother    Hypertension Father    Diabetes Father    Hyperlipidemia Father    Obesity Father    Allergic rhinitis Father    Diabetes Maternal Grandfather    Cancer Maternal Grandfather        prostate   Heart disease Maternal Grandfather    Heart disease Paternal Grandfather    Allergic rhinitis Paternal Aunt    Allergic rhinitis Maternal Grandmother    Asthma Neg Hx    Eczema Neg Hx    Urticaria Neg Hx     ROS: Review of Systems  Constitutional: Positive for malaise/fatigue and weight loss.  Gastrointestinal: Negative for nausea and vomiting.  Musculoskeletal:       Negative muscle weakness  Endo/Heme/Allergies:       Negative hypoglycemia  Psychiatric/Behavioral:       + Anxiety    PHYSICAL EXAM: Pt in no acute distress  RECENT LABS AND TESTS: BMET    Component Value Date/Time   NA 142 04/08/2018 1420   NA 139 05/01/2017 1009   K 4.6 04/08/2018 1420   CL 107 04/08/2018 1420   CO2 25 04/08/2018 1420   GLUCOSE 87 04/08/2018 1420   BUN 12 04/08/2018 1420  BUN 10 05/01/2017 1009   CREATININE 0.77 04/08/2018 1420   CALCIUM 9.1 04/08/2018 1420   GFRNONAA  >60 04/08/2018 1420   GFRAA >60 04/08/2018 1420   Lab Results  Component Value Date   HGBA1C 5.4 10/30/2018   HGBA1C 5.5 01/16/2018   HGBA1C 5.4 10/03/2017   HGBA1C 5.3 05/01/2017   Lab Results  Component Value Date   INSULIN 29.3 (H) 10/30/2018   INSULIN 12.9 01/16/2018   INSULIN 21.7 10/03/2017   INSULIN 11.1 05/01/2017   CBC    Component Value Date/Time   WBC 10.5 04/08/2018 1420   RBC 5.14 (H) 04/08/2018 1420   HGB 15.5 (H) 04/08/2018 1420   HGB 14.0 05/01/2017 1009   HCT 47.3 (H) 04/08/2018 1420   HCT 43.3 05/01/2017 1009   PLT 318 04/08/2018 1420   MCV 92.0 04/08/2018 1420   MCV 93 05/01/2017 1009   MCH 30.2 04/08/2018 1420   MCHC 32.8 04/08/2018 1420   RDW 13.7 04/08/2018 1420   RDW 13.7 05/01/2017 1009   LYMPHSABS 1.5 05/01/2017 1009   MONOABS 0.7 01/09/2012 0956   EOSABS 0.1 05/01/2017 1009   BASOSABS 0.0 05/01/2017 1009   Iron/TIBC/Ferritin/ %Sat No results found for: IRON, TIBC, FERRITIN, IRONPCTSAT Lipid Panel     Component Value Date/Time   CHOL 163 10/30/2018 1206   TRIG 71 10/30/2018 1206   HDL 44 10/30/2018 1206   CHOLHDL 3.4 01/09/2012 0956   VLDL 14 01/09/2012 0956   LDLCALC 105 (H) 10/30/2018 1206   Hepatic Function Panel     Component Value Date/Time   PROT 6.8 05/01/2017 1009   ALBUMIN 4.2 05/01/2017 1009   AST 15 05/01/2017 1009   ALT 14 05/01/2017 1009   ALKPHOS 61 05/01/2017 1009   BILITOT 0.3 05/01/2017 1009      Component Value Date/Time   TSH 1.640 05/01/2017 1009   TSH 1.850 01/09/2012 0956      I, Burt KnackSharon Martin, am acting as transcriptionist for Debbra RidingAlexandria Kadolph, MD  I have reviewed the above documentation for accuracy and completeness, and I agree with the above. - Debbra RidingAlexandria Kadolph, MD

## 2019-03-23 ENCOUNTER — Encounter (INDEPENDENT_AMBULATORY_CARE_PROVIDER_SITE_OTHER): Payer: Self-pay | Admitting: Family Medicine

## 2019-03-23 ENCOUNTER — Other Ambulatory Visit (INDEPENDENT_AMBULATORY_CARE_PROVIDER_SITE_OTHER): Payer: Self-pay

## 2019-03-23 DIAGNOSIS — R7303 Prediabetes: Secondary | ICD-10-CM

## 2019-03-23 MED ORDER — LIRAGLUTIDE 18 MG/3ML ~~LOC~~ SOPN: 0.9000 mg | PEN_INJECTOR | Freq: Every morning | SUBCUTANEOUS | 0 refills | Status: DC

## 2019-04-01 ENCOUNTER — Ambulatory Visit (INDEPENDENT_AMBULATORY_CARE_PROVIDER_SITE_OTHER): Payer: BC Managed Care – PPO | Admitting: Family Medicine

## 2019-04-01 ENCOUNTER — Encounter (INDEPENDENT_AMBULATORY_CARE_PROVIDER_SITE_OTHER): Payer: Self-pay | Admitting: Family Medicine

## 2019-04-01 ENCOUNTER — Other Ambulatory Visit: Payer: Self-pay

## 2019-04-01 DIAGNOSIS — R7303 Prediabetes: Secondary | ICD-10-CM | POA: Diagnosis not present

## 2019-04-01 DIAGNOSIS — F419 Anxiety disorder, unspecified: Secondary | ICD-10-CM

## 2019-04-01 DIAGNOSIS — Z6841 Body Mass Index (BMI) 40.0 and over, adult: Secondary | ICD-10-CM

## 2019-04-01 MED ORDER — LIRAGLUTIDE 18 MG/3ML ~~LOC~~ SOPN: 0.9000 mg | PEN_INJECTOR | Freq: Every morning | SUBCUTANEOUS | 0 refills | Status: DC

## 2019-04-01 MED ORDER — FLUOXETINE HCL 10 MG PO CAPS: 10.0000 mg | ORAL_CAPSULE | ORAL | 0 refills | Status: DC
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
## 2019-04-06 ENCOUNTER — Encounter (INDEPENDENT_AMBULATORY_CARE_PROVIDER_SITE_OTHER): Payer: Self-pay | Admitting: Family Medicine

## 2019-04-06 DIAGNOSIS — R7303 Prediabetes: Secondary | ICD-10-CM

## 2019-04-06 MED ORDER — LIRAGLUTIDE 18 MG/3ML ~~LOC~~ SOPN: 1.2000 mg | PEN_INJECTOR | Freq: Every morning | SUBCUTANEOUS | 0 refills | Status: DC

## 2019-04-06 NOTE — Telephone Encounter (Signed)
Please advise 

## 2019-04-16 ENCOUNTER — Ambulatory Visit (INDEPENDENT_AMBULATORY_CARE_PROVIDER_SITE_OTHER): Payer: BC Managed Care – PPO | Admitting: Family Medicine

## 2019-04-16 DIAGNOSIS — Z23 Encounter for immunization: Secondary | ICD-10-CM | POA: Diagnosis not present

## 2019-04-17 DIAGNOSIS — R3915 Urgency of urination: Secondary | ICD-10-CM | POA: Diagnosis not present

## 2019-04-17 DIAGNOSIS — N3000 Acute cystitis without hematuria: Secondary | ICD-10-CM | POA: Diagnosis not present

## 2019-04-17 DIAGNOSIS — R350 Frequency of micturition: Secondary | ICD-10-CM | POA: Diagnosis not present

## 2019-04-21 ENCOUNTER — Other Ambulatory Visit: Payer: Self-pay | Admitting: Allergy and Immunology

## 2019-04-21 ENCOUNTER — Telehealth (INDEPENDENT_AMBULATORY_CARE_PROVIDER_SITE_OTHER): Payer: BC Managed Care – PPO | Admitting: Family Medicine

## 2019-04-21 ENCOUNTER — Encounter (INDEPENDENT_AMBULATORY_CARE_PROVIDER_SITE_OTHER): Payer: Self-pay | Admitting: Family Medicine

## 2019-04-21 ENCOUNTER — Other Ambulatory Visit: Payer: Self-pay

## 2019-04-21 DIAGNOSIS — E8881 Metabolic syndrome: Secondary | ICD-10-CM

## 2019-04-21 DIAGNOSIS — F419 Anxiety disorder, unspecified: Secondary | ICD-10-CM

## 2019-04-21 DIAGNOSIS — Z6841 Body Mass Index (BMI) 40.0 and over, adult: Secondary | ICD-10-CM | POA: Diagnosis not present

## 2019-04-21 MED ORDER — FLUOXETINE HCL 10 MG PO CAPS: 10.0000 mg | ORAL_CAPSULE | ORAL | 0 refills | Status: DC

## 2019-04-21 MED ORDER — BD PEN NEEDLE NANO U/F 32G X 4 MM MISC: 1.0000 | Freq: Every day | 0 refills | Status: DC

## 2019-04-22 NOTE — Progress Notes (Signed)
Office: [phone]  /  Fax: [phone] TeleHealth Visit:  Cynthia [name] has verbally consented to this TeleHealth visit today. The patient is located at home, the provider is located at the [facility] office. The participants in this visit include the listed provider and patient. The visit was conducted today via face time.  HPI:   Chief Complaint: OBESITY Cynthia [name] is here to discuss her progress with her obesity treatment plan. She is on the Category 2 plan and is following her eating plan approximately 65-70 % of the time. She states she is exercising 0 minutes 0 times per week. Cynthia [name] voices the last few weeks she was doing better until 4 days ago. Her weight went down to 271 lbs a week ago, but went back up to 275 lbs 3 days ago. She really isn't feeling that well and hasn't been eating much. She tried to eat on the plan last night but felt she really overate and felt ill.  We were unable to weigh the patient today for this TeleHealth visit. She feels as if she has gained weight since her last visit. She has lost 0 lbs since starting treatment with [place].  Insulin Resistance Cynthia [name] has a diagnosis of insulin resistance based on her elevated fasting insulin level >5. Although Cynthia [name]'s blood glucose readings are still under good control, insulin resistance puts her at greater risk of metabolic syndrome and diabetes. She notes her symptoms are better controlled with Victoza. She notes 1 episode of feeling nausea, close to vomiting. She continues to work on diet and exercise to decrease risk of diabetes.  Depression with Anxiety Cynthia [name] notes her symptoms are better controlled on Prozac. She shows no sign of suicidal or homicidal ideations.  ASSESSMENT AND PLAN:  Anxiety - Plan: FLUoxetine (PROZAC) 10 MG capsule  Insulin resistance - Plan: Insulin Pen Needle (BD PEN NEEDLE NANO U/F) 32G X 4 MM MISC  PLAN:  Insulin Resistance Cynthia [name] will continue to work on weight loss,  exercise, and decreasing simple carbohydrates in her diet to help decrease the risk of diabetes. We dicussed metformin including benefits and risks. She was informed that eating too many simple carbohydrates or too many calories at one sitting increases the likelihood of GI side effects. Cynthia [name] agrees to continue Victoza, and we will refill pen needles #100 count with no refills. Cynthia [name] agrees to follow up with our clinic in 2 weeks as directed to monitor her progress.  Depression with Anxiety We discussed behavior modification techniques today to help Cynthia [name] deal with her depression and anxiety. Cynthia [name] agrees to continue taking Prozac 10 mg tablets, 3 tablets PO daily #90 and we will refill for 1 month. Cynthia [name] agrees to follow up with our clinic in 2 weeks.  Obesity Cynthia [name] is currently in the action stage of change. As such, her goal is to continue with weight loss efforts She has agreed to follow the Category 2 plan Cynthia [name] has been instructed to work up to a goal of 150 minutes of combined cardio and strengthening exercise per week for weight loss and overall health benefits. We discussed the following Behavioral Modification Strategies today: increasing lean protein intake, increasing vegetables and work on meal planning and easy cooking plans, keeping healthy foods in the home, and planning for success   Cynthia [name] has agreed to follow up with our clinic in 2 weeks. She was informed of the importance of frequent follow up visits to maximize her success with intensive lifestyle modifications for her multiple  health conditions.  ALLERGIES: Allergies  Allergen Reactions  . Aquacel [Carboxymethylcellulose] Itching, Rash and Other (See Comments)    Caused blisters  . Penicillins     Unknown reaction Has patient had a PCN reaction causing immediate rash, facial/tongue/throat swelling, SOB or lightheadedness with hypotension: Unknown Has patient had a PCN reaction causing severe rash involving mucus  membranes or skin necrosis: Unknown Has patient had a PCN reaction that required hospitalization: No Has patient had a PCN reaction occurring within the last 10 years: No If all of the above answers are "NO", then may proceed with Cephalosporin use.   . Adhesive [Tape] Rash    Prefers to use paper tape  . Latex Rash  . Oxycodone Itching and Rash    MEDICATIONS: Current Outpatient Medications on File Prior to Visit  Medication Sig Dispense Refill  . acetaminophen (TYLENOL) 500 MG tablet Take 1,000 mg by mouth 3 (three) times daily as needed for moderate pain.     Marland Kitchen albuterol (PROAIR HFA) 108 (90 Base) MCG/ACT inhaler Inhale 1-2 puffs into the lungs every 6 (six) hours as needed for wheezing or shortness of breath. 18 g 1  . alum hydroxide-mag trisilicate (GAVISCON) 71-06 MG CHEW chewable tablet Chew 1-2 tablets by mouth daily as needed for indigestion or heartburn.    Marland Kitchen BREO ELLIPTA 200-25 MCG/INH AEPB INHALE 1 PUFF INTO THE LUNGS DAILY 60 each 0  . Carbinoxamine Maleate 4 MG TABS Take 1 tablet (4 mg total) by mouth every 6 (six) hours. 28 each 5  . flunisolide (NASALIDE) 25 MCG/ACT (0.025%) SOLN Place 2 sprays into the nose daily. 1 Bottle 0  . hyoscyamine (LEVSIN SL) 0.125 MG SL tablet Take 0.125 mg by mouth 3 (three) times daily with meals.   0  . levocetirizine (XYZAL) 5 MG tablet TAKE 1 TABLET(5 MG) BY MOUTH EVERY EVENING 30 tablet 5  . liraglutide (VICTOZA) 18 MG/3ML SOPN Inject 0.2 mLs (1.2 mg total) into the skin every morning. 2 pen 0  . meloxicam (MOBIC) 15 MG tablet     . methocarbamol (ROBAXIN) 500 MG tablet Take 500 mg by mouth 2 (two) times daily as needed for muscle spasms.     . montelukast (SINGULAIR) 10 MG tablet TAKE 1 TABLET(10 MG) BY MOUTH AT BEDTIME 30 tablet 5  . nitrofurantoin (MACRODANTIN) 100 MG capsule Take 100 mg by mouth 2 (two) times daily.    . norethindrone-ethinyl estradiol (MICROGESTIN FE 1/20) 1-20 MG-MCG tablet Take 1 tablet by mouth daily. (Patient  taking differently: Take 1 tablet by mouth at bedtime. ) 3 Package 4  . olopatadine (PATANOL) 0.1 % ophthalmic solution Place 1 drop into both eyes daily as needed for allergies.     . Olopatadine HCl (PAZEO) 0.7 % SOLN Place 1 drop into both eyes 1 day or 1 dose. 1 Bottle 5  . QNASL 80 MCG/ACT AERS USE 1 SPRAY IN EACH NOSTRIL TWICE DAILY AS NEEDED FOR ALLERGIES 10.6 g 3  . traMADol (ULTRAM) 50 MG tablet Take by mouth every 6 (six) hours as needed.    . traZODone (DESYREL) 100 MG tablet Take 100 mg by mouth at bedtime.     . Vitamin D, Ergocalciferol, (DRISDOL) 1.25 MG (50000 UT) CAPS capsule Take 1 capsule (50,000 Units total) by mouth every 7 (seven) days. 4 capsule 0  . HYDROcodone-acetaminophen (NORCO) 10-325 MG tablet Take 1 tablet by mouth every 6 (six) hours as needed.     No current facility-administered medications on file prior  to visit.     PAST MEDICAL HISTORY: Past Medical History:  Diagnosis Date  . ADHD (attention deficit hyperactivity disorder)   . Allergic rhinitis   . Anxiety   . Asthma   . Back injury    COMPETITIVE INJURY IN HIGH SCHOOL  . Back pain   . Depression   . Fatty liver   . Fibromyalgia   . Gallbladder problem   . GERD (gastroesophageal reflux disease)   . IBS (irritable bowel syndrome)   . Insulin resistance   . Joint pain   . Migraines   . OA (osteoarthritis) of knee   . Obesity   . Pneumonia   . PONV (postoperative nausea and vomiting)   . Seasonal allergies   . Vitamin D deficiency     PAST SURGICAL HISTORY: Past Surgical History:  Procedure Laterality Date  . CHOLECYSTECTOMY    . DG DILATION URETERS    . KNEE ARTHROSCOPY Left   . KNEE ARTHROSCOPY WITH LATERAL MENISECTOMY Right 04/11/2018   Procedure: RIGHT KNEE ARTHROSCOPY WITH PARTIAL MENISECTOMY CHONDROPLASTY;  Surgeon: Eugenia Mcalpine, MD;  Location: [facility];  Service: Orthopedics;  Laterality: Right;  . MOUTH SURGERY    . SHOULDER SURGERY  2002   RIGHT  . WISDOM TOOTH EXTRACTION     . WRIST ARTHROSCOPY     Right    SOCIAL HISTORY: Social History   Tobacco Use  . Smoking status: Never Smoker  . Smokeless tobacco: Never Used  Substance Use Topics  . Alcohol use: Yes    Comment: social  . Drug use: No    FAMILY HISTORY: Family History  Problem Relation Age of Onset  . Multiple sclerosis Mother   . Diabetes Mother   . Hyperlipidemia Mother   . Thyroid disease Mother   . Depression Mother   . Anxiety disorder Mother   . Obesity Mother   . Allergic rhinitis Mother   . Hypertension Father   . Diabetes Father   . Hyperlipidemia Father   . Obesity Father   . Allergic rhinitis Father   . Diabetes Maternal Grandfather   . Cancer Maternal Grandfather        prostate  . Heart disease Maternal Grandfather   . Heart disease Paternal Grandfather   . Allergic rhinitis Paternal Aunt   . Allergic rhinitis Maternal Grandmother   . Asthma Neg Hx   . Eczema Neg Hx   . Urticaria Neg Hx     ROS: Review of Systems  Constitutional: Negative for weight loss.  Psychiatric/Behavioral: Positive for depression. Negative for suicidal ideas.    PHYSICAL EXAM: Pt in no acute distress  RECENT LABS AND TESTS: BMET    Component Value Date/Time   NA 142 04/08/2018 1420   NA 139 05/01/2017 1009   K 4.6 04/08/2018 1420   CL 107 04/08/2018 1420   CO2 25 04/08/2018 1420   GLUCOSE 87 04/08/2018 1420   BUN 12 04/08/2018 1420   BUN 10 05/01/2017 1009   CREATININE 0.77 04/08/2018 1420   CALCIUM 9.1 04/08/2018 1420   GFRNONAA >60 04/08/2018 1420   GFRAA >60 04/08/2018 1420   Lab Results  Component Value Date   HGBA1C 5.4 10/30/2018   HGBA1C 5.5 01/16/2018   HGBA1C 5.4 10/03/2017   HGBA1C 5.3 05/01/2017   Lab Results  Component Value Date   INSULIN 29.3 (H) 10/30/2018   INSULIN 12.9 01/16/2018   INSULIN 21.7 10/03/2017   INSULIN 11.1 05/01/2017   CBC    Component  Value Date/Time   WBC 10.5 04/08/2018 1420   RBC 5.14 (H) 04/08/2018 1420   HGB 15.5 (H)  04/08/2018 1420   HGB 14.0 05/01/2017 1009   HCT 47.3 (H) 04/08/2018 1420   HCT 43.3 05/01/2017 1009   PLT 318 04/08/2018 1420   MCV 92.0 04/08/2018 1420   MCV 93 05/01/2017 1009   MCH 30.2 04/08/2018 1420   MCHC 32.8 04/08/2018 1420   RDW 13.7 04/08/2018 1420   RDW 13.7 05/01/2017 1009   LYMPHSABS 1.5 05/01/2017 1009   MONOABS 0.7 01/09/2012 0956   EOSABS 0.1 05/01/2017 1009   BASOSABS 0.0 05/01/2017 1009   Iron/TIBC/Ferritin/ %Sat No results found for: IRON, TIBC, FERRITIN, IRONPCTSAT Lipid Panel     Component Value Date/Time   CHOL 163 10/30/2018 1206   TRIG 71 10/30/2018 1206   HDL 44 10/30/2018 1206   CHOLHDL 3.4 01/09/2012 0956   VLDL 14 01/09/2012 0956   LDLCALC 105 (H) 10/30/2018 1206   Hepatic Function Panel     Component Value Date/Time   PROT 6.8 05/01/2017 1009   ALBUMIN 4.2 05/01/2017 1009   AST 15 05/01/2017 1009   ALT 14 05/01/2017 1009   ALKPHOS 61 05/01/2017 1009   BILITOT 0.3 05/01/2017 1009      Component Value Date/Time   TSH 1.640 05/01/2017 1009   TSH 1.850 01/09/2012 0956      I, Burt KnackSharon Martin, am acting as transcriptionist for Debbra RidingAlexandria Kadolph, MD  I have reviewed the above documentation for accuracy and completeness, and I agree with the above. - Debbra RidingAlexandria Kadolph, MD

## 2019-04-23 ENCOUNTER — Other Ambulatory Visit: Payer: Self-pay

## 2019-04-23 MED ORDER — BREO ELLIPTA 200-25 MCG/INH IN AEPB: 1.0000 | INHALATION_SPRAY | Freq: Every day | RESPIRATORY_TRACT | 0 refills | Status: DC

## 2019-05-06 ENCOUNTER — Other Ambulatory Visit: Payer: Self-pay

## 2019-05-06 ENCOUNTER — Ambulatory Visit (INDEPENDENT_AMBULATORY_CARE_PROVIDER_SITE_OTHER): Payer: BC Managed Care – PPO | Admitting: Family Medicine

## 2019-05-06 DIAGNOSIS — Z6841 Body Mass Index (BMI) 40.0 and over, adult: Secondary | ICD-10-CM | POA: Diagnosis not present

## 2019-05-06 DIAGNOSIS — R7303 Prediabetes: Secondary | ICD-10-CM

## 2019-05-06 DIAGNOSIS — E559 Vitamin D deficiency, unspecified: Secondary | ICD-10-CM

## 2019-05-06 MED ORDER — LIRAGLUTIDE 18 MG/3ML ~~LOC~~ SOPN: 1.2000 mg | PEN_INJECTOR | Freq: Every morning | SUBCUTANEOUS | 0 refills | Status: DC

## 2019-05-06 MED ORDER — VITAMIN D (ERGOCALCIFEROL) 1.25 MG (50000 UNIT) PO CAPS: 50000.0000 [IU] | ORAL_CAPSULE | ORAL | 0 refills | Status: DC

## 2019-05-07 NOTE — Progress Notes (Signed)
Office: [phone]  /  Fax: [phone] TeleHealth Visit:  Cynthia [name] has verbally consented to this TeleHealth visit today. The patient is located at home, the provider is located at the [facility] office. The participants in this visit include the listed provider and patient. The visit was conducted today via face time.  HPI:   Chief Complaint: OBESITY Cynthia [name] is here to discuss her progress with her obesity treatment plan. She is on the Category 2 plan and is following her eating plan approximately 40 % of the time. She states she is walking and doing physical therapy exercises for 15-20 minutes 4 times per week. Cynthia [name] voices she has had a very difficult last few weeks. Her aunt was diagnosis with stage 3 or 4 lung cancer, and 2 days later had numerous strokes and passed away a few days later. Her family is grieving. She reports her weight was 273 lbs. We were unable to weigh the patient today for this TeleHealth visit. She feels as if she has maintained her weight since her last visit. She has lost 0 lbs since starting treatment with [place].  Vitamin D Deficiency Cynthia [name] has a diagnosis of vitamin D deficiency. She is currently taking prescription Vit D. She notes fatigue and denies nausea, vomiting or muscle weakness.  Insulin Resistance Cynthia [name] has a diagnosis of insulin resistance based on her elevated fasting insulin level >5. Although Cynthia [name]'s blood glucose readings are still under good control, insulin resistance puts her at greater risk of metabolic syndrome and diabetes. She on Victoza and denies side effects currently, and is tolerating 1.2 mg. She continues to work on diet and exercise to decrease risk of diabetes.  ASSESSMENT AND PLAN:  Class 3 severe obesity with serious comorbidity and body mass index (BMI) of 50.0 to 59.9 in adult, unspecified obesity type (HCC)  Prediabetes - Plan: liraglutide (VICTOZA) 18 MG/3ML SOPN  Vitamin D deficiency - Plan: Vitamin D,  Ergocalciferol, (DRISDOL) 1.25 MG (50000 UT) CAPS capsule  PLAN:  Vitamin D Deficiency Cynthia [name] was informed that low vitamin D levels contributes to fatigue and are associated with obesity, breast, and colon cancer. Cynthia [name] agrees to continue taking prescription Vit D 50,000 IU every week #4 and we will refill for 1 month. She will follow up for routine testing of vitamin D, at least 2-3 times per year. She was informed of the risk of over-replacement of vitamin D and agrees to not increase her dose unless she discusses this with [place] first. Cynthia [name] agrees to follow up with our clinic in 2 weeks.  Insulin Resistance Cynthia [name] will continue to work on weight loss, exercise, and decreasing simple carbohydrates in her diet to help decrease the risk of diabetes. We dicussed metformin including benefits and risks. She was informed that eating too many simple carbohydrates or too many calories at one sitting increases the likelihood of GI side effects. Cynthia [name] agrees to continue Victoza 1.2 mg SubQ daily #2 pens and we will refill for 1 month. Cynthia [name] agrees to follow up with our clinic in 2 weeks as directed to monitor her progress.  Obesity Cynthia [name] is currently in the action stage of change. As such, her goal is to continue with weight loss efforts She has agreed to keep a food journal with 1400 calories and 80+ grams of protein daily Cynthia [name] has been instructed to work up to a goal of 150 minutes of combined cardio and strengthening exercise per week for weight loss and overall health benefits. We  discussed the following Behavioral Modification Strategies today: increasing lean protein intake, increasing vegetables and work on meal planning and easy cooking plans, keeping healthy foods in the home, and planning for success   Cynthia [name] has agreed to follow up with our clinic in 2 weeks with Dr. Dalbert GarnetBeasley. She was informed of the importance of frequent follow up visits to maximize her success with intensive lifestyle  modifications for her multiple health conditions.  ALLERGIES: Allergies  Allergen Reactions  . Aquacel [Carboxymethylcellulose] Itching, Rash and Other (See Comments)    Caused blisters  . Penicillins     Unknown reaction Has patient had a PCN reaction causing immediate rash, facial/tongue/throat swelling, SOB or lightheadedness with hypotension: Unknown Has patient had a PCN reaction causing severe rash involving mucus membranes or skin necrosis: Unknown Has patient had a PCN reaction that required hospitalization: No Has patient had a PCN reaction occurring within the last 10 years: No If all of the above answers are "NO", then may proceed with Cephalosporin use.   . Adhesive [Tape] Rash    Prefers to use paper tape  . Latex Rash  . Oxycodone Itching and Rash    MEDICATIONS: Current Outpatient Medications on File Prior to Visit  Medication Sig Dispense Refill  . acetaminophen (TYLENOL) 500 MG tablet Take 1,000 mg by mouth 3 (three) times daily as needed for moderate pain.     Marland Kitchen. albuterol (PROAIR HFA) 108 (90 Base) MCG/ACT inhaler Inhale 1-2 puffs into the lungs every 6 (six) hours as needed for wheezing or shortness of breath. 18 g 1  . alum hydroxide-mag trisilicate (GAVISCON) 80-20 MG CHEW chewable tablet Chew 1-2 tablets by mouth daily as needed for indigestion or heartburn.    . Carbinoxamine Maleate 4 MG TABS Take 1 tablet (4 mg total) by mouth every 6 (six) hours. 28 each 5  . flunisolide (NASALIDE) 25 MCG/ACT (0.025%) SOLN Place 2 sprays into the nose daily. 1 Bottle 0  . FLUoxetine (PROZAC) 10 MG capsule Take 1 capsule (10 mg total) by mouth as directed. Take three tabs by mouth daily 90 capsule 0  . fluticasone furoate-vilanterol (BREO ELLIPTA) 200-25 MCG/INH AEPB Inhale 1 puff into the lungs daily. 60 each 0  . HYDROcodone-acetaminophen (NORCO) 10-325 MG tablet Take 1 tablet by mouth every 6 (six) hours as needed.    . hyoscyamine (LEVSIN SL) 0.125 MG SL tablet Take 0.125  mg by mouth 3 (three) times daily with meals.   0  . Insulin Pen Needle (BD PEN NEEDLE NANO U/F) 32G X 4 MM MISC 1 Device by Does not apply route daily. 100 each 0  . levocetirizine (XYZAL) 5 MG tablet TAKE 1 TABLET(5 MG) BY MOUTH EVERY EVENING 30 tablet 5  . meloxicam (MOBIC) 15 MG tablet     . methocarbamol (ROBAXIN) 500 MG tablet Take 500 mg by mouth 2 (two) times daily as needed for muscle spasms.     . montelukast (SINGULAIR) 10 MG tablet TAKE 1 TABLET(10 MG) BY MOUTH AT BEDTIME 30 tablet 5  . norethindrone-ethinyl estradiol (MICROGESTIN FE 1/20) 1-20 MG-MCG tablet Take 1 tablet by mouth daily. (Patient taking differently: Take 1 tablet by mouth at bedtime. ) 3 Package 4  . olopatadine (PATANOL) 0.1 % ophthalmic solution Place 1 drop into both eyes daily as needed for allergies.     . QNASL 80 MCG/ACT AERS USE 1 SPRAY IN EACH NOSTRIL TWICE DAILY AS NEEDED FOR ALLERGIES 10.6 g 3  . traMADol (ULTRAM) 50 MG tablet  Take by mouth every 6 (six) hours as needed.    . traZODone (DESYREL) 100 MG tablet Take 100 mg by mouth at bedtime.      No current facility-administered medications on file prior to visit.     PAST MEDICAL HISTORY: Past Medical History:  Diagnosis Date  . ADHD (attention deficit hyperactivity disorder)   . Allergic rhinitis   . Anxiety   . Asthma   . Back injury    COMPETITIVE INJURY IN HIGH SCHOOL  . Back pain   . Depression   . Fatty liver   . Fibromyalgia   . Gallbladder problem   . GERD (gastroesophageal reflux disease)   . IBS (irritable bowel syndrome)   . Insulin resistance   . Joint pain   . Migraines   . OA (osteoarthritis) of knee   . Obesity   . Pneumonia   . PONV (postoperative nausea and vomiting)   . Seasonal allergies   . Vitamin D deficiency     PAST SURGICAL HISTORY: Past Surgical History:  Procedure Laterality Date  . CHOLECYSTECTOMY    . DG DILATION URETERS    . KNEE ARTHROSCOPY Left   . KNEE ARTHROSCOPY WITH LATERAL MENISECTOMY Right  04/11/2018   Procedure: RIGHT KNEE ARTHROSCOPY WITH PARTIAL MENISECTOMY CHONDROPLASTY;  Surgeon: Eugenia Mcalpine, MD;  Location: [facility];  Service: Orthopedics;  Laterality: Right;  . MOUTH SURGERY    . SHOULDER SURGERY  2002   RIGHT  . WISDOM TOOTH EXTRACTION    . WRIST ARTHROSCOPY     Right    SOCIAL HISTORY: Social History   Tobacco Use  . Smoking status: Never Smoker  . Smokeless tobacco: Never Used  Substance Use Topics  . Alcohol use: Yes    Comment: social  . Drug use: No    FAMILY HISTORY: Family History  Problem Relation Age of Onset  . Multiple sclerosis Mother   . Diabetes Mother   . Hyperlipidemia Mother   . Thyroid disease Mother   . Depression Mother   . Anxiety disorder Mother   . Obesity Mother   . Allergic rhinitis Mother   . Hypertension Father   . Diabetes Father   . Hyperlipidemia Father   . Obesity Father   . Allergic rhinitis Father   . Diabetes Maternal Grandfather   . Cancer Maternal Grandfather        prostate  . Heart disease Maternal Grandfather   . Heart disease Paternal Grandfather   . Allergic rhinitis Paternal Aunt   . Allergic rhinitis Maternal Grandmother   . Asthma Neg Hx   . Eczema Neg Hx   . Urticaria Neg Hx     ROS: Review of Systems  Constitutional: Positive for malaise/fatigue. Negative for weight loss.  Gastrointestinal: Negative for nausea and vomiting.  Musculoskeletal:       Negative muscle weakness    PHYSICAL EXAM: Pt in no acute distress  RECENT LABS AND TESTS: BMET    Component Value Date/Time   NA 142 04/08/2018 1420   NA 139 05/01/2017 1009   K 4.6 04/08/2018 1420   CL 107 04/08/2018 1420   CO2 25 04/08/2018 1420   GLUCOSE 87 04/08/2018 1420   BUN 12 04/08/2018 1420   BUN 10 05/01/2017 1009   CREATININE 0.77 04/08/2018 1420   CALCIUM 9.1 04/08/2018 1420   GFRNONAA >60 04/08/2018 1420   GFRAA >60 04/08/2018 1420   Lab Results  Component Value Date   HGBA1C 5.4 10/30/2018  HGBA1C 5.5  01/16/2018   HGBA1C 5.4 10/03/2017   HGBA1C 5.3 05/01/2017   Lab Results  Component Value Date   INSULIN 29.3 (H) 10/30/2018   INSULIN 12.9 01/16/2018   INSULIN 21.7 10/03/2017   INSULIN 11.1 05/01/2017   CBC    Component Value Date/Time   WBC 10.5 04/08/2018 1420   RBC 5.14 (H) 04/08/2018 1420   HGB 15.5 (H) 04/08/2018 1420   HGB 14.0 05/01/2017 1009   HCT 47.3 (H) 04/08/2018 1420   HCT 43.3 05/01/2017 1009   PLT 318 04/08/2018 1420   MCV 92.0 04/08/2018 1420   MCV 93 05/01/2017 1009   MCH 30.2 04/08/2018 1420   MCHC 32.8 04/08/2018 1420   RDW 13.7 04/08/2018 1420   RDW 13.7 05/01/2017 1009   LYMPHSABS 1.5 05/01/2017 1009   MONOABS 0.7 01/09/2012 0956   EOSABS 0.1 05/01/2017 1009   BASOSABS 0.0 05/01/2017 1009   Iron/TIBC/Ferritin/ %Sat No results found for: IRON, TIBC, FERRITIN, IRONPCTSAT Lipid Panel     Component Value Date/Time   CHOL 163 10/30/2018 1206   TRIG 71 10/30/2018 1206   HDL 44 10/30/2018 1206   CHOLHDL 3.4 01/09/2012 0956   VLDL 14 01/09/2012 0956   LDLCALC 105 (H) 10/30/2018 1206   Hepatic Function Panel     Component Value Date/Time   PROT 6.8 05/01/2017 1009   ALBUMIN 4.2 05/01/2017 1009   AST 15 05/01/2017 1009   ALT 14 05/01/2017 1009   ALKPHOS 61 05/01/2017 1009   BILITOT 0.3 05/01/2017 1009      Component Value Date/Time   TSH 1.640 05/01/2017 1009   TSH 1.850 01/09/2012 0956      I, Trixie Dredge, am acting as transcriptionist for Ilene Qua, MD  I have reviewed the above documentation for accuracy and completeness, and I agree with the above. - Ilene Qua, MD

## 2019-05-11 ENCOUNTER — Encounter: Payer: Self-pay | Admitting: Allergy and Immunology

## 2019-05-11 ENCOUNTER — Ambulatory Visit (INDEPENDENT_AMBULATORY_CARE_PROVIDER_SITE_OTHER): Payer: BC Managed Care – PPO | Admitting: Allergy and Immunology

## 2019-05-11 ENCOUNTER — Other Ambulatory Visit: Payer: Self-pay

## 2019-05-11 VITALS — BP 132/70 | HR 94 | Temp 97.9°F | Resp 18 | Ht 62.0 in | Wt 272.0 lb

## 2019-05-11 DIAGNOSIS — J4540 Moderate persistent asthma, uncomplicated: Secondary | ICD-10-CM

## 2019-05-11 DIAGNOSIS — H6983 Other specified disorders of Eustachian tube, bilateral: Secondary | ICD-10-CM | POA: Diagnosis not present

## 2019-05-11 DIAGNOSIS — H1013 Acute atopic conjunctivitis, bilateral: Secondary | ICD-10-CM | POA: Diagnosis not present

## 2019-05-11 DIAGNOSIS — J3089 Other allergic rhinitis: Secondary | ICD-10-CM | POA: Diagnosis not present

## 2019-05-11 DIAGNOSIS — H6993 Unspecified Eustachian tube disorder, bilateral: Secondary | ICD-10-CM | POA: Insufficient documentation

## 2019-05-11 MED ORDER — BREO ELLIPTA 200-25 MCG/INH IN AEPB: 1.0000 | INHALATION_SPRAY | Freq: Every day | RESPIRATORY_TRACT | 5 refills | Status: DC

## 2019-05-11 MED ORDER — QNASL 80 MCG/ACT NA AERS: INHALATION_SPRAY | NASAL | 5 refills | Status: DC

## 2019-05-11 MED ORDER — ALBUTEROL SULFATE HFA 108 (90 BASE) MCG/ACT IN AERS: 1.0000 | INHALATION_SPRAY | Freq: Four times a day (QID) | RESPIRATORY_TRACT | 1 refills | Status: DC | PRN

## 2019-05-11 MED ORDER — MONTELUKAST SODIUM 10 MG PO TABS: 10.0000 mg | ORAL_TABLET | Freq: Every day | ORAL | 5 refills | Status: DC

## 2019-05-11 NOTE — Assessment & Plan Note (Addendum)
Stable.  For now, continue Brio Ellipta 200-25 g, 1 inhalation daily, montelukast 10 mg daily at bedtime, and albuterol HFA, 1-2 inhalations every 4-6 hours as needed.  Subjective and objective measures of pulmonary function will be followed and the treatment plan will be adjusted accordingly.

## 2019-05-11 NOTE — Assessment & Plan Note (Addendum)
   Continue appropriate allergen avoidance measures, montelukast 10 mg daily at bedtime, and Qnasl 1-2 times daily as needed.  A refill prescription has been provided for Qnasl.  If her insurance no longer covers Qnasl, we will switch to Secor.  Nasal saline lavage (NeilMed) has been recommended as needed and prior to medicated nasal sprays along with instructions for proper administration.  To avoid diminishing benefit with daily use (tachyphylaxis) of second generation antihistamine, consider alternating every few months between fexofenadine (Allegra) and levocetirizine (Xyzal).  If allergen avoidance measures and medications fail to adequately relieve symptoms, aeroallergen immunotherapy will be considered.

## 2019-05-11 NOTE — Assessment & Plan Note (Signed)
Treatment plan as outlined above for allergic rhinitis.  For thick post nasal drainage, nasal congestion, and/or sinus pressure or ear pressure, add guaifenesin 1200 mg (Mucinex Maximum Strength) plus/minus pseudoephedrine 120 mg  twice daily as needed with adequate hydration as discussed. Pseudoephedrine is only to be used for short-term relief of nasal/sinus congestion. Long-term use is discouraged due to potential side effects.  Hold Allegra and Xyzal while taking Mucinex or Mucinex D.

## 2019-05-11 NOTE — Patient Instructions (Addendum)
Moderate persistent asthma Stable.  For now, continue Brio Ellipta 200-25 g, 1 inhalation daily, montelukast 10 mg daily at bedtime, and albuterol HFA, 1-2 inhalations every 4-6 hours as needed.  Subjective and objective measures of pulmonary function will be followed and the treatment plan will be adjusted accordingly.  Seasonal and perennial allergic rhinitis  Continue appropriate allergen avoidance measures, montelukast 10 mg daily at bedtime, and Qnasl 1-2 times daily as needed.  A refill prescription has been provided for Qnasl.  If her insurance no longer covers Qnasl, we will switch to [place].  Nasal saline lavage (NeilMed) has been recommended as needed and prior to medicated nasal sprays along with instructions for proper administration.  To avoid diminishing benefit with daily use (tachyphylaxis) of second generation antihistamine, consider alternating every few months between fexofenadine (Allegra) and levocetirizine (Xyzal).  If allergen avoidance measures and medications fail to adequately relieve symptoms, aeroallergen immunotherapy will be considered.  Eustachian tube dysfunction, bilateral Treatment plan as outlined above for allergic rhinitis.  For thick post nasal drainage, nasal congestion, and/or sinus pressure or ear pressure, add guaifenesin 1200 mg (Mucinex Maximum Strength) plus/minus pseudoephedrine 120 mg  twice daily as needed with adequate hydration as discussed. Pseudoephedrine is only to be used for short-term relief of nasal/sinus congestion. Long-term use is discouraged due to potential side effects.  Hold Allegra and Xyzal while taking Mucinex or Mucinex D.   Return in about 5 months (around 10/09/2019), or if symptoms worsen or fail to improve.

## 2019-05-11 NOTE — Progress Notes (Signed)
Follow-up Note  RE: Cynthia [name] MRN: [id number] DOB: 1985-10-14 Date of Office Visit: 05/11/2019  Primary care provider: Clayborn Heron, MD Referring provider: Clayborn Heron, MD  History of present illness: Cynthia [name] is a 33 y.o. female with persistent asthma and allergic rhinoconjunctivitis presenting today for follow-up.  She was last seen in this clinic in February 2020.  She reports that over the past week she has experienced ear pressure bilaterally.  She experiences occasional rhinorrhea but denies sinus pressure.  She does experience postnasal drainage which causes throat irritation and coughing.  She is currently taking Brio Ellipta 200 g, and montelukast 10 mg daily.  She has rarely required albuterol rescue in the interval since her previous visit and does not experience limitations in normal daily activities or nocturnal awakenings due to lower respiratory symptoms.  Assessment and plan: Moderate persistent asthma Stable.  For now, continue Brio Ellipta 200-25 g, 1 inhalation daily, montelukast 10 mg daily at bedtime, and albuterol HFA, 1-2 inhalations every 4-6 hours as needed.  Subjective and objective measures of pulmonary function will be followed and the treatment plan will be adjusted accordingly.  Seasonal and perennial allergic rhinitis  Continue appropriate allergen avoidance measures, montelukast 10 mg daily at bedtime, and Qnasl 1-2 times daily as needed.  A refill prescription has been provided for Qnasl.  If her insurance no longer covers Qnasl, we will switch to [place].  Nasal saline lavage (NeilMed) has been recommended as needed and prior to medicated nasal sprays along with instructions for proper administration.  To avoid diminishing benefit with daily use (tachyphylaxis) of second generation antihistamine, consider alternating every few months between fexofenadine (Allegra) and levocetirizine (Xyzal).  If allergen avoidance  measures and medications fail to adequately relieve symptoms, aeroallergen immunotherapy will be considered.  Eustachian tube dysfunction, bilateral Treatment plan as outlined above for allergic rhinitis.  For thick post nasal drainage, nasal congestion, and/or sinus pressure or ear pressure, add guaifenesin 1200 mg (Mucinex Maximum Strength) plus/minus pseudoephedrine 120 mg  twice daily as needed with adequate hydration as discussed. Pseudoephedrine is only to be used for short-term relief of nasal/sinus congestion. Long-term use is discouraged due to potential side effects.  Hold Allegra and Xyzal while taking Mucinex or Mucinex D.   Meds ordered this encounter  Medications  . QNASL 80 MCG/ACT AERS    Sig: USE 1 SPRAY IN EACH NOSTRIL TWICE DAILY AS NEEDED FOR ALLERGIES    Dispense:  10.6 g    Refill:  5  . fluticasone furoate-vilanterol (BREO ELLIPTA) 200-25 MCG/INH AEPB    Sig: Inhale 1 puff into the lungs daily.    Dispense:  60 each    Refill:  5  . albuterol (PROAIR HFA) 108 (90 Base) MCG/ACT inhaler    Sig: Inhale 1-2 puffs into the lungs every 6 (six) hours as needed for wheezing or shortness of breath.    Dispense:  18 g    Refill:  1  . montelukast (SINGULAIR) 10 MG tablet    Sig: Take 1 tablet (10 mg total) by mouth at bedtime.    Dispense:  30 tablet    Refill:  5    Diagnostics: Spirometry:  Normal with an FEV1 of 82% predicted and an FEV1 ratio of 101%.  FEV1 is consistent with previous study.  This study was performed while the patient was asymptomatic.  Please see scanned spirometry results for details.    Physical examination: Blood pressure 132/70, pulse 94,  temperature 97.9 F (36.6 C), temperature source Temporal, resp. rate 18, height 5\' 2"  (1.575 m), weight 272 lb (123.4 kg), SpO2 97 %.  General: Alert, interactive, in no acute distress. HEENT: TMs pearly gray, turbinates moderately edematous without discharge, post-pharynx moderately erythematous. Neck:  Supple without lymphadenopathy. Lungs: Clear to auscultation without wheezing, rhonchi or rales. CV: Normal S1, S2 without murmurs. Skin: Warm and dry, without lesions or rashes.  The following portions of the patient's history were reviewed and updated as appropriate: allergies, current medications, past family history, past medical history, past social history, past surgical history and problem list.   Current Outpatient Medications  Medication Sig Dispense Refill  . acetaminophen (TYLENOL) 500 MG tablet Take 1,000 mg by mouth 3 (three) times daily as needed for moderate pain.     Marland Kitchen albuterol (PROAIR HFA) 108 (90 Base) MCG/ACT inhaler Inhale 1-2 puffs into the lungs every 6 (six) hours as needed for wheezing or shortness of breath. 18 g 1  . alum hydroxide-mag trisilicate (GAVISCON) 23-76 MG CHEW chewable tablet Chew 1-2 tablets by mouth daily as needed for indigestion or heartburn.    . fexofenadine (ALLEGRA ALLERGY) 180 MG tablet Allegra Allergy    . FLUoxetine (PROZAC) 10 MG capsule Take 1 capsule (10 mg total) by mouth as directed. Take three tabs by mouth daily 90 capsule 0  . fluticasone furoate-vilanterol (BREO ELLIPTA) 200-25 MCG/INH AEPB Inhale 1 puff into the lungs daily. 60 each 5  . HYDROcodone-acetaminophen (NORCO) 10-325 MG tablet Take 1 tablet by mouth every 6 (six) hours as needed.    . hyoscyamine (LEVSIN SL) 0.125 MG SL tablet Take 0.125 mg by mouth 3 (three) times daily with meals.   0  . Insulin Pen Needle (BD PEN NEEDLE NANO U/F) 32G X 4 MM MISC 1 Device by Does not apply route daily. 100 each 0  . levocetirizine (XYZAL) 5 MG tablet TAKE 1 TABLET(5 MG) BY MOUTH EVERY EVENING 30 tablet 5  . liraglutide (VICTOZA) 18 MG/3ML SOPN Inject 0.2 mLs (1.2 mg total) into the skin every morning. 2 pen 0  . meloxicam (MOBIC) 15 MG tablet     . methocarbamol (ROBAXIN) 500 MG tablet Take 500 mg by mouth 2 (two) times daily as needed for muscle spasms.     . montelukast (SINGULAIR) 10  MG tablet Take 1 tablet (10 mg total) by mouth at bedtime. 30 tablet 5  . norethindrone-ethinyl estradiol (MICROGESTIN FE 1/20) 1-20 MG-MCG tablet Take 1 tablet by mouth daily. (Patient taking differently: Take 1 tablet by mouth at bedtime. ) 3 Package 4  . olopatadine (PATANOL) 0.1 % ophthalmic solution Place 1 drop into both eyes daily as needed for allergies.     . QNASL 80 MCG/ACT AERS USE 1 SPRAY IN EACH NOSTRIL TWICE DAILY AS NEEDED FOR ALLERGIES 10.6 g 5  . traMADol (ULTRAM) 50 MG tablet Take by mouth every 6 (six) hours as needed.    . traZODone (DESYREL) 100 MG tablet Take 100 mg by mouth at bedtime.     . Vitamin D, Ergocalciferol, (DRISDOL) 1.25 MG (50000 UT) CAPS capsule Take 1 capsule (50,000 Units total) by mouth every 7 (seven) days. 4 capsule 0   No current facility-administered medications for this visit.     Allergies  Allergen Reactions  . Aquacel [Carboxymethylcellulose] Itching, Rash and Other (See Comments)    Caused blisters  . Penicillins     Unknown reaction Has patient had a PCN reaction causing immediate rash, facial/tongue/throat  swelling, SOB or lightheadedness with hypotension: Unknown Has patient had a PCN reaction causing severe rash involving mucus membranes or skin necrosis: Unknown Has patient had a PCN reaction that required hospitalization: No Has patient had a PCN reaction occurring within the last 10 years: No If all of the above answers are "NO", then may proceed with Cephalosporin use.   . Adhesive [Tape] Rash    Prefers to use paper tape  . Latex Rash  . Oxycodone Itching and Rash    I appreciate the opportunity to take part in Leonora's care. Please do not hesitate to contact me with questions.  Sincerely,   R. Jorene Guestarter Sheniah Supak, MD

## 2019-05-12 ENCOUNTER — Telehealth: Payer: Self-pay

## 2019-05-12 NOTE — Telephone Encounter (Signed)
PA requested for Qnasl 80 mcg. Submitted via covermymeds. Status is currently pending.

## 2019-05-13 ENCOUNTER — Telehealth: Payer: Self-pay

## 2019-05-13 NOTE — Telephone Encounter (Addendum)
Pharmacy notified    Pt notified

## 2019-05-13 NOTE — Telephone Encounter (Signed)
Call from [place] at [facility]:  Cynthia [name] has been approved.

## 2019-05-13 NOTE — Telephone Encounter (Signed)
Noted. Thanks.

## 2019-05-13 NOTE — Telephone Encounter (Signed)
Pt is asking about why she gets a raspy and scratchy voice and pitchy hoarseness sometimes.  Is it an allergy related?  Has not been rinsing with her Breo.  Let patient know that she needs to rinse after using her Breo after every use.  Has been having issues with her ears, some fullness and pain. But has been checked out already.    Please advise:

## 2019-05-20 ENCOUNTER — Telehealth (INDEPENDENT_AMBULATORY_CARE_PROVIDER_SITE_OTHER): Payer: BC Managed Care – PPO | Admitting: Family Medicine

## 2019-05-20 ENCOUNTER — Encounter (INDEPENDENT_AMBULATORY_CARE_PROVIDER_SITE_OTHER): Payer: Self-pay | Admitting: Family Medicine

## 2019-05-20 ENCOUNTER — Other Ambulatory Visit: Payer: Self-pay

## 2019-05-20 DIAGNOSIS — E559 Vitamin D deficiency, unspecified: Secondary | ICD-10-CM

## 2019-05-20 DIAGNOSIS — Z6841 Body Mass Index (BMI) 40.0 and over, adult: Secondary | ICD-10-CM | POA: Diagnosis not present

## 2019-05-20 DIAGNOSIS — F419 Anxiety disorder, unspecified: Secondary | ICD-10-CM

## 2019-05-20 MED ORDER — FLUOXETINE HCL 10 MG PO CAPS: 10.0000 mg | ORAL_CAPSULE | ORAL | 0 refills | Status: DC

## 2019-05-20 MED ORDER — VITAMIN D (ERGOCALCIFEROL) 1.25 MG (50000 UNIT) PO CAPS: 50000.0000 [IU] | ORAL_CAPSULE | ORAL | 0 refills | Status: DC

## 2019-05-21 NOTE — Progress Notes (Signed)
Office: [phone]  /  Fax: [phone] TeleHealth Visit:  Cynthia [name] has verbally consented to this TeleHealth visit today. The patient is located at home, the provider is located at the [facility] office. The participants in this visit include the listed provider and patient. The visit was conducted today via face time.  HPI:   Chief Complaint: OBESITY Cynthia [name] is here to discuss her progress with her obesity treatment plan. She is on the keep a food journal with 1400 calories and 80+ grams of protein daily and is following her eating plan approximately 65 % of the time. She states she is exercising 0 minutes 0 times per week. Cynthia [name] overall has done well maintaining her weight in the last 2 weeks. She has increased simple carbohydrates recently at times but is trying to be mindful.  We were unable to weigh the patient today for this TeleHealth visit. She feels as if she has maintained her weight since her last visit. She has lost 0 lbs since starting treatment with us.  Vitamin D Deficiency Cynthia [name] has a diagnosis of vitamin D deficiency. She is stable on prescription Vit D. She is due for labs soon. She denies nausea, vomiting or muscle weakness.  Anxiety Cynthia [name] is stable on Prozac. She has had recent deaths in her family, which has been difficult but she feels she is in control. She shows no sign of suicidal or homicidal ideations.  ASSESSMENT AND PLAN:  Vitamin D deficiency - Plan: Vitamin D, Ergocalciferol, (DRISDOL) 1.25 MG (50000 UT) CAPS capsule  Anxiety - Plan: FLUoxetine (PROZAC) 10 MG capsule  Class 3 severe obesity with serious comorbidity and body mass index (BMI) of 50.0 to 59.9 in adult, unspecified obesity type (HCC)  PLAN:  Vitamin D Deficiency Cynthia [name] was informed that low vitamin D levels contributes to fatigue and are associated with obesity, breast, and colon cancer. Cynthia [name] agrees to continue taking prescription Vit D 50,000 IU every week #4 and we  will refill for 1 month. She will follow up for routine testing of vitamin D, at least 2-3 times per year. She was informed of the risk of over-replacement of vitamin D and agrees to not increase her dose unless she discusses this with us first. We will recheck labs in 3 weeks. Cynthia [name] agrees to follow up with our clinic in 3 weeks.  Anxiety We discussed behavior modification techniques today to help Cynthia [name] deal with her anxiety. Cynthia [name] agrees to continue taking Prozac 10 mg PO q daily #90 day supply with no refills. Cynthia [name] agrees to follow up with our clinic in 3 weeks.  Obesity Cynthia [name] is currently in the action stage of change. As such, her goal is to continue with weight loss efforts She has agreed to follow the Category 2 plan Cynthia [name] has been instructed to work up to a goal of 150 minutes of combined cardio and strengthening exercise per week for weight loss and overall health benefits. We discussed the following Behavioral Modification Strategies today: increasing lean protein intake, decreasing simple carbohydrates  and emotional eating strategies   Cynthia [name] has agreed to follow up with our clinic in 3 weeks. She was informed of the importance of frequent follow up visits to maximize her success with intensive lifestyle modifications for her multiple health conditions.  ALLERGIES: Allergies  Allergen Reactions  . Aquacel [Carboxymethylcellulose] Itching, Rash and Other (See Comments)    Caused blisters  . Penicillins     Unknown reaction Has patient had a PCN  reaction causing immediate rash, facial/tongue/throat swelling, SOB or lightheadedness with hypotension: Unknown Has patient had a PCN reaction causing severe rash involving mucus membranes or skin necrosis: Unknown Has patient had a PCN reaction that required hospitalization: No Has patient had a PCN reaction occurring within the last 10 years: No If all of the above answers are "NO", then may proceed with Cephalosporin use.   .  Adhesive [Tape] Rash    Prefers to use paper tape  . Latex Rash  . Oxycodone Itching and Rash    MEDICATIONS: Current Outpatient Medications on File Prior to Visit  Medication Sig Dispense Refill  . acetaminophen (TYLENOL) 500 MG tablet Take 1,000 mg by mouth 3 (three) times daily as needed for moderate pain.     Marland Kitchen albuterol (PROAIR HFA) 108 (90 Base) MCG/ACT inhaler Inhale 1-2 puffs into the lungs every 6 (six) hours as needed for wheezing or shortness of breath. 18 g 1  . alum hydroxide-mag trisilicate (GAVISCON) 62-83 MG CHEW chewable tablet Chew 1-2 tablets by mouth daily as needed for indigestion or heartburn.    . fexofenadine (ALLEGRA ALLERGY) 180 MG tablet Allegra Allergy    . fluticasone furoate-vilanterol (BREO ELLIPTA) 200-25 MCG/INH AEPB Inhale 1 puff into the lungs daily. 60 each 5  . HYDROcodone-acetaminophen (NORCO) 10-325 MG tablet Take 1 tablet by mouth every 6 (six) hours as needed.    . hyoscyamine (LEVSIN SL) 0.125 MG SL tablet Take 0.125 mg by mouth 3 (three) times daily with meals.   0  . Insulin Pen Needle (BD PEN NEEDLE NANO U/F) 32G X 4 MM MISC 1 Device by Does not apply route daily. 100 each 0  . levocetirizine (XYZAL) 5 MG tablet TAKE 1 TABLET(5 MG) BY MOUTH EVERY EVENING 30 tablet 5  . liraglutide (VICTOZA) 18 MG/3ML SOPN Inject 0.2 mLs (1.2 mg total) into the skin every morning. 2 pen 0  . meloxicam (MOBIC) 15 MG tablet     . methocarbamol (ROBAXIN) 500 MG tablet Take 500 mg by mouth 2 (two) times daily as needed for muscle spasms.     . montelukast (SINGULAIR) 10 MG tablet Take 1 tablet (10 mg total) by mouth at bedtime. 30 tablet 5  . norethindrone-ethinyl estradiol (MICROGESTIN FE 1/20) 1-20 MG-MCG tablet Take 1 tablet by mouth daily. (Patient taking differently: Take 1 tablet by mouth at bedtime. ) 3 Package 4  . olopatadine (PATANOL) 0.1 % ophthalmic solution Place 1 drop into both eyes daily as needed for allergies.     . QNASL 80 MCG/ACT AERS USE 1 SPRAY IN  EACH NOSTRIL TWICE DAILY AS NEEDED FOR ALLERGIES 10.6 g 5  . traMADol (ULTRAM) 50 MG tablet Take by mouth every 6 (six) hours as needed.    . traZODone (DESYREL) 100 MG tablet Take 100 mg by mouth at bedtime.      No current facility-administered medications on file prior to visit.     PAST MEDICAL HISTORY: Past Medical History:  Diagnosis Date  . ADHD (attention deficit hyperactivity disorder)   . Allergic rhinitis   . Anxiety   . Asthma   . Back injury    COMPETITIVE INJURY IN HIGH SCHOOL  . Back pain   . Depression   . Fatty liver   . Fibromyalgia   . Gallbladder problem   . GERD (gastroesophageal reflux disease)   . IBS (irritable bowel syndrome)   . Insulin resistance   . Joint pain   . Migraines   . OA (  osteoarthritis) of knee   . Obesity   . Pneumonia   . PONV (postoperative nausea and vomiting)   . Seasonal allergies   . Vitamin D deficiency     PAST SURGICAL HISTORY: Past Surgical History:  Procedure Laterality Date  . CHOLECYSTECTOMY    . DG DILATION URETERS    . KNEE ARTHROSCOPY Left   . KNEE ARTHROSCOPY WITH LATERAL MENISECTOMY Right 04/11/2018   Procedure: RIGHT KNEE ARTHROSCOPY WITH PARTIAL MENISECTOMY CHONDROPLASTY;  Surgeon: Eugenia Mcalpine, MD;  Location: [facility];  Service: Orthopedics;  Laterality: Right;  . MOUTH SURGERY    . SHOULDER SURGERY  2002   RIGHT  . WISDOM TOOTH EXTRACTION    . WRIST ARTHROSCOPY     Right    SOCIAL HISTORY: Social History   Tobacco Use  . Smoking status: Never Smoker  . Smokeless tobacco: Never Used  Substance Use Topics  . Alcohol use: Yes    Comment: social  . Drug use: No    FAMILY HISTORY: Family History  Problem Relation Age of Onset  . Multiple sclerosis Mother   . Diabetes Mother   . Hyperlipidemia Mother   . Thyroid disease Mother   . Depression Mother   . Anxiety disorder Mother   . Obesity Mother   . Allergic rhinitis Mother   . Hypertension Father   . Diabetes Father   . Hyperlipidemia  Father   . Obesity Father   . Allergic rhinitis Father   . Diabetes Maternal Grandfather   . Cancer Maternal Grandfather        prostate  . Heart disease Maternal Grandfather   . Heart disease Paternal Grandfather   . Allergic rhinitis Paternal Aunt   . Allergic rhinitis Maternal Grandmother   . Asthma Neg Hx   . Eczema Neg Hx   . Urticaria Neg Hx     ROS: Review of Systems  Constitutional: Negative for weight loss.  Gastrointestinal: Negative for nausea and vomiting.  Musculoskeletal:       Negative muscle weakness  Psychiatric/Behavioral: Negative for suicidal ideas.       + Anxiety    PHYSICAL EXAM: Pt in no acute distress  RECENT LABS AND TESTS: BMET    Component Value Date/Time   NA 142 04/08/2018 1420   NA 139 05/01/2017 1009   K 4.6 04/08/2018 1420   CL 107 04/08/2018 1420   CO2 25 04/08/2018 1420   GLUCOSE 87 04/08/2018 1420   BUN 12 04/08/2018 1420   BUN 10 05/01/2017 1009   CREATININE 0.77 04/08/2018 1420   CALCIUM 9.1 04/08/2018 1420   GFRNONAA >60 04/08/2018 1420   GFRAA >60 04/08/2018 1420   Lab Results  Component Value Date   HGBA1C 5.4 10/30/2018   HGBA1C 5.5 01/16/2018   HGBA1C 5.4 10/03/2017   HGBA1C 5.3 05/01/2017   Lab Results  Component Value Date   INSULIN 29.3 (H) 10/30/2018   INSULIN 12.9 01/16/2018   INSULIN 21.7 10/03/2017   INSULIN 11.1 05/01/2017   CBC    Component Value Date/Time   WBC 10.5 04/08/2018 1420   RBC 5.14 (H) 04/08/2018 1420   HGB 15.5 (H) 04/08/2018 1420   HGB 14.0 05/01/2017 1009   HCT 47.3 (H) 04/08/2018 1420   HCT 43.3 05/01/2017 1009   PLT 318 04/08/2018 1420   MCV 92.0 04/08/2018 1420   MCV 93 05/01/2017 1009   MCH 30.2 04/08/2018 1420   MCHC 32.8 04/08/2018 1420   RDW 13.7 04/08/2018 1420  RDW 13.7 05/01/2017 1009   LYMPHSABS 1.5 05/01/2017 1009   MONOABS 0.7 01/09/2012 0956   EOSABS 0.1 05/01/2017 1009   BASOSABS 0.0 05/01/2017 1009   Iron/TIBC/Ferritin/ %Sat No results found for: IRON,  TIBC, FERRITIN, IRONPCTSAT Lipid Panel     Component Value Date/Time   CHOL 163 10/30/2018 1206   TRIG 71 10/30/2018 1206   HDL 44 10/30/2018 1206   CHOLHDL 3.4 01/09/2012 0956   VLDL 14 01/09/2012 0956   LDLCALC 105 (H) 10/30/2018 1206   Hepatic Function Panel     Component Value Date/Time   PROT 6.8 05/01/2017 1009   ALBUMIN 4.2 05/01/2017 1009   AST 15 05/01/2017 1009   ALT 14 05/01/2017 1009   ALKPHOS 61 05/01/2017 1009   BILITOT 0.3 05/01/2017 1009      Component Value Date/Time   TSH 1.640 05/01/2017 1009   TSH 1.850 01/09/2012 0956      I, Burt Knack, am acting as transcriptionist for Quillian Quince, MD I have reviewed the above documentation for accuracy and completeness, and I agree with the above. -Quillian Quince, MD

## 2019-05-29 ENCOUNTER — Other Ambulatory Visit: Payer: Self-pay

## 2019-05-29 MED ORDER — OLOPATADINE HCL 0.1 % OP SOLN: 1.0000 [drp] | Freq: Two times a day (BID) | OPHTHALMIC | 5 refills | Status: DC

## 2019-06-02 ENCOUNTER — Other Ambulatory Visit (INDEPENDENT_AMBULATORY_CARE_PROVIDER_SITE_OTHER): Payer: Self-pay | Admitting: Family Medicine

## 2019-06-02 DIAGNOSIS — E559 Vitamin D deficiency, unspecified: Secondary | ICD-10-CM

## 2019-06-02 DIAGNOSIS — R7303 Prediabetes: Secondary | ICD-10-CM

## 2019-06-03 ENCOUNTER — Other Ambulatory Visit: Payer: Self-pay

## 2019-06-03 ENCOUNTER — Telehealth (INDEPENDENT_AMBULATORY_CARE_PROVIDER_SITE_OTHER): Payer: BC Managed Care – PPO | Admitting: Family Medicine

## 2019-06-03 ENCOUNTER — Encounter (INDEPENDENT_AMBULATORY_CARE_PROVIDER_SITE_OTHER): Payer: Self-pay | Admitting: Family Medicine

## 2019-06-03 DIAGNOSIS — E559 Vitamin D deficiency, unspecified: Secondary | ICD-10-CM | POA: Diagnosis not present

## 2019-06-03 DIAGNOSIS — R7303 Prediabetes: Secondary | ICD-10-CM | POA: Diagnosis not present

## 2019-06-03 DIAGNOSIS — Z6841 Body Mass Index (BMI) 40.0 and over, adult: Secondary | ICD-10-CM

## 2019-06-03 DIAGNOSIS — F419 Anxiety disorder, unspecified: Secondary | ICD-10-CM | POA: Diagnosis not present

## 2019-06-03 MED ORDER — LIRAGLUTIDE 18 MG/3ML ~~LOC~~ SOPN: 1.2000 mg | PEN_INJECTOR | Freq: Every morning | SUBCUTANEOUS | 0 refills | Status: DC

## 2019-06-03 MED ORDER — VITAMIN D (ERGOCALCIFEROL) 1.25 MG (50000 UNIT) PO CAPS: 50000.0000 [IU] | ORAL_CAPSULE | ORAL | 0 refills | Status: DC

## 2019-06-03 MED ORDER — FLUOXETINE HCL 10 MG PO CAPS: 10.0000 mg | ORAL_CAPSULE | ORAL | 0 refills | Status: DC

## 2019-06-03 NOTE — Progress Notes (Signed)
Office: [phone]  /  Fax: [phone] TeleHealth Visit:  Cynthia [name] has verbally consented to this TeleHealth visit today. The patient is located at home, the provider is located at the [facility] office. The participants in this visit include the listed provider and patient.  The visit was conducted today via Facetime.  HPI:   Chief Complaint: OBESITY Cynthia [name] is here to discuss her progress with her obesity treatment plan. She is on the  follow the Category 2 plan and is following her eating plan approximately 65-70 % of the time. She states she is exercising by doing physical therapy at home 3 times per week. Cynthia [name] is currently struggling with consistently sticking to the plan. She reports recent deaths in the family which got her off the plan. She reports she never goes over 1400 calories a day. She does have days when she does not eat enough calories or protein.  We were unable to weigh the patient today for this TeleHealth visit. She feels as if she has maintained weight since her last visit. She has lost 0 lbs since starting treatment with [place].  Insulin Resistance Cynthia [name] has a diagnosis of insulin resistance based on her elevated fasting insulin level >5. Although Cynthia [name]'s blood glucose readings are still under good control, insulin resistance puts her at greater risk of metabolic syndrome and diabetes. She is not taking metformin currently. She feels her weight fluctuates less with the Victoza. She denies polyphagia and continues to work on diet and exercise to decrease risk of diabetes. Lab Results  Component Value Date   HGBA1C 5.4 10/30/2018    Vitamin D deficiency Cynthia [name] has a diagnosis of vitamin D deficiency. She is currently taking vit D and denies nausea, vomiting or muscle weakness. Her vitamin D level is not yet at goal.   Ref. Range 10/30/2018 12:06  Vitamin D, 25-Hydroxy Latest Ref Range: 30.0 - 100.0 ng/mL 32.9    Anxiety Cynthia [name] does admit to eating  when she is stressed and anxious. She feels her anxiety is well controlled on Prozac. She shows no signs of suicidal or homicidal ideations.   ASSESSMENT AND PLAN:  Prediabetes - Plan: liraglutide (VICTOZA) 18 MG/3ML SOPN  Anxiety - Plan: FLUoxetine (PROZAC) 10 MG capsule  Vitamin D deficiency - Plan: Vitamin D, Ergocalciferol, (DRISDOL) 1.25 MG (50000 UT) CAPS capsule  Class 3 severe obesity with serious comorbidity and body mass index (BMI) of 45.0 to 49.9 in adult, unspecified obesity type ([name])  PLAN: Insulin Resistance Cynthia [name] will continue to work on weight loss, exercise, and decreasing simple carbohydrates in her diet to help decrease the risk of diabetes.  Cynthia [name] agrees to continue Victoza 1.2 mg daily #2 pens with no refills. Cynthia [name] agreed to follow up with [place] as directed to monitor her progress. We will repeat HgA1c and Insulin fasting at next visit.   Vitamin D Deficiency  She agrees to continue to take prescription Vit D @50 ,000 IU every week #4 with no refills and will follow up for routine testing of vitamin D, at least 2-3 times per year. She was informed of the risk of over-replacement of vitamin D and agrees to not increase her dose unless she discusses this with [place] first. Agrees to follow up with our clinic as directed. We will repeat vitamin D level at next appt.   Anxiety  She agrees to continue prozac 30 mg qd #90 with no refills. Agrees to follow up with our clinic as directed.  Obesity Cynthia [name] is currently in the action stage of change. As such, her goal is to continue with weight loss efforts She has agreed to keep a food journal with 1400 calories and 80g of protein daily or follow the Category 2 plan.  Cynthia [name] has been instructed to work up to a goal of 150 minutes of combined cardio and strengthening exercise per week for weight loss and overall health benefits. We discussed the following Behavioral Modification Strategies today: increasing lean protein intake and  planning for success.  We discussed increasing calories to 1200 per day and to make sure she eats at least 80g of protein daily.   Cynthia [name] has agreed to follow up with our clinic in 3 weeks. She was informed of the importance of frequent follow up visits to maximize her success with intensive lifestyle modifications for her multiple health conditions.  ALLERGIES: Allergies  Allergen Reactions  . Aquacel [Carboxymethylcellulose] Itching, Rash and Other (See Comments)    Caused blisters  . Penicillins     Unknown reaction Has patient had a PCN reaction causing immediate rash, facial/tongue/throat swelling, SOB or lightheadedness with hypotension: Unknown Has patient had a PCN reaction causing severe rash involving mucus membranes or skin necrosis: Unknown Has patient had a PCN reaction that required hospitalization: No Has patient had a PCN reaction occurring within the last 10 years: No If all of the above answers are "NO", then may proceed with Cephalosporin use.   . Adhesive [Tape] Rash    Prefers to use paper tape  . Latex Rash  . Oxycodone Itching and Rash    MEDICATIONS: Current Outpatient Medications on File Prior to Visit  Medication Sig Dispense Refill  . acetaminophen (TYLENOL) 500 MG tablet Take 1,000 mg by mouth 3 (three) times daily as needed for moderate pain.     Marland Kitchen albuterol (PROAIR HFA) 108 (90 Base) MCG/ACT inhaler Inhale 1-2 puffs into the lungs every 6 (six) hours as needed for wheezing or shortness of breath. 18 g 1  . alum hydroxide-mag trisilicate (GAVISCON) 80-20 MG CHEW chewable tablet Chew 1-2 tablets by mouth daily as needed for indigestion or heartburn.    . fexofenadine (ALLEGRA ALLERGY) 180 MG tablet Allegra Allergy    . fluticasone furoate-vilanterol (BREO ELLIPTA) 200-25 MCG/INH AEPB Inhale 1 puff into the lungs daily. 60 each 5  . HYDROcodone-acetaminophen (NORCO) 10-325 MG tablet Take 1 tablet by mouth every 6 (six) hours as needed.    . hyoscyamine  (LEVSIN SL) 0.125 MG SL tablet Take 0.125 mg by mouth 3 (three) times daily with meals.   0  . Insulin Pen Needle (BD PEN NEEDLE NANO U/F) 32G X 4 MM MISC 1 Device by Does not apply route daily. 100 each 0  . levocetirizine (XYZAL) 5 MG tablet TAKE 1 TABLET(5 MG) BY MOUTH EVERY EVENING 30 tablet 5  . meloxicam (MOBIC) 15 MG tablet     . methocarbamol (ROBAXIN) 500 MG tablet Take 500 mg by mouth 2 (two) times daily as needed for muscle spasms.     . montelukast (SINGULAIR) 10 MG tablet Take 1 tablet (10 mg total) by mouth at bedtime. 30 tablet 5  . norethindrone-ethinyl estradiol (MICROGESTIN FE 1/20) 1-20 MG-MCG tablet Take 1 tablet by mouth daily. (Patient taking differently: Take 1 tablet by mouth at bedtime. ) 3 Package 4  . olopatadine (PATANOL) 0.1 % ophthalmic solution Place 1 drop into both eyes 2 (two) times daily. 5 mL 5  . QNASL 80 MCG/ACT AERS USE  1 SPRAY IN EACH NOSTRIL TWICE DAILY AS NEEDED FOR ALLERGIES 10.6 g 5  . traMADol (ULTRAM) 50 MG tablet Take by mouth every 6 (six) hours as needed.    . traZODone (DESYREL) 100 MG tablet Take 100 mg by mouth at bedtime.      No current facility-administered medications on file prior to visit.     PAST MEDICAL HISTORY: Past Medical History:  Diagnosis Date  . ADHD (attention deficit hyperactivity disorder)   . Allergic rhinitis   . Anxiety   . Asthma   . Back injury    COMPETITIVE INJURY IN HIGH SCHOOL  . Back pain   . Depression   . Fatty liver   . Fibromyalgia   . Gallbladder problem   . GERD (gastroesophageal reflux disease)   . IBS (irritable bowel syndrome)   . Insulin resistance   . Joint pain   . Migraines   . OA (osteoarthritis) of knee   . Obesity   . Pneumonia   . PONV (postoperative nausea and vomiting)   . Seasonal allergies   . Vitamin D deficiency     PAST SURGICAL HISTORY: Past Surgical History:  Procedure Laterality Date  . CHOLECYSTECTOMY    . DG DILATION URETERS    . KNEE ARTHROSCOPY Left   . KNEE  ARTHROSCOPY WITH LATERAL MENISECTOMY Right 04/11/2018   Procedure: RIGHT KNEE ARTHROSCOPY WITH PARTIAL MENISECTOMY CHONDROPLASTY;  Surgeon: Eugenia Mcalpineollins, Robert, MD;  Location: [facility];  Service: Orthopedics;  Laterality: Right;  . MOUTH SURGERY    . SHOULDER SURGERY  2002   RIGHT  . WISDOM TOOTH EXTRACTION    . WRIST ARTHROSCOPY     Right    SOCIAL HISTORY: Social History   Tobacco Use  . Smoking status: Never Smoker  . Smokeless tobacco: Never Used  Substance Use Topics  . Alcohol use: Yes    Comment: social  . Drug use: No    FAMILY HISTORY: Family History  Problem Relation Age of Onset  . Multiple sclerosis Mother   . Diabetes Mother   . Hyperlipidemia Mother   . Thyroid disease Mother   . Depression Mother   . Anxiety disorder Mother   . Obesity Mother   . Allergic rhinitis Mother   . Hypertension Father   . Diabetes Father   . Hyperlipidemia Father   . Obesity Father   . Allergic rhinitis Father   . Diabetes Maternal Grandfather   . Cancer Maternal Grandfather        prostate  . Heart disease Maternal Grandfather   . Heart disease Paternal Grandfather   . Allergic rhinitis Paternal Aunt   . Allergic rhinitis Maternal Grandmother   . Asthma Neg Hx   . Eczema Neg Hx   . Urticaria Neg Hx     ROS: Review of Systems  Gastrointestinal: Negative for nausea and vomiting.  Musculoskeletal:       Negative for muscle weakness  Endo/Heme/Allergies:       Denies polyphagia   Psychiatric/Behavioral: The patient is nervous/anxious.     PHYSICAL EXAM: Pt in no acute distress  RECENT LABS AND TESTS: BMET    Component Value Date/Time   NA 142 04/08/2018 1420   NA 139 05/01/2017 1009   K 4.6 04/08/2018 1420   CL 107 04/08/2018 1420   CO2 25 04/08/2018 1420   GLUCOSE 87 04/08/2018 1420   BUN 12 04/08/2018 1420   BUN 10 05/01/2017 1009   CREATININE 0.77 04/08/2018 1420  CALCIUM 9.1 04/08/2018 1420   GFRNONAA >60 04/08/2018 1420   GFRAA >60 04/08/2018 1420    Lab Results  Component Value Date   HGBA1C 5.4 10/30/2018   HGBA1C 5.5 01/16/2018   HGBA1C 5.4 10/03/2017   HGBA1C 5.3 05/01/2017   Lab Results  Component Value Date   INSULIN 29.3 (H) 10/30/2018   INSULIN 12.9 01/16/2018   INSULIN 21.7 10/03/2017   INSULIN 11.1 05/01/2017   CBC    Component Value Date/Time   WBC 10.5 04/08/2018 1420   RBC 5.14 (H) 04/08/2018 1420   HGB 15.5 (H) 04/08/2018 1420   HGB 14.0 05/01/2017 1009   HCT 47.3 (H) 04/08/2018 1420   HCT 43.3 05/01/2017 1009   PLT 318 04/08/2018 1420   MCV 92.0 04/08/2018 1420   MCV 93 05/01/2017 1009   MCH 30.2 04/08/2018 1420   MCHC 32.8 04/08/2018 1420   RDW 13.7 04/08/2018 1420   RDW 13.7 05/01/2017 1009   LYMPHSABS 1.5 05/01/2017 1009   MONOABS 0.7 01/09/2012 0956   EOSABS 0.1 05/01/2017 1009   BASOSABS 0.0 05/01/2017 1009   Iron/TIBC/Ferritin/ %Sat No results found for: IRON, TIBC, FERRITIN, IRONPCTSAT Lipid Panel     Component Value Date/Time   CHOL 163 10/30/2018 1206   TRIG 71 10/30/2018 1206   HDL 44 10/30/2018 1206   CHOLHDL 3.4 01/09/2012 0956   VLDL 14 01/09/2012 0956   LDLCALC 105 (H) 10/30/2018 1206   Hepatic Function Panel     Component Value Date/Time   PROT 6.8 05/01/2017 1009   ALBUMIN 4.2 05/01/2017 1009   AST 15 05/01/2017 1009   ALT 14 05/01/2017 1009   ALKPHOS 61 05/01/2017 1009   BILITOT 0.3 05/01/2017 1009      Component Value Date/Time   TSH 1.640 05/01/2017 1009   TSH 1.850 01/09/2012 0956    Ref. Range 10/30/2018 12:06  Vitamin D, 25-Hydroxy Latest Ref Range: 30.0 - 100.0 ng/mL 32.9     I, Jeralene Peters, am acting as Energy manager for [place], FNP   I have reviewed the above documentation for accuracy and completeness, and I agree with the above.  - Taylan Marez, FNP-C.

## 2019-06-04 ENCOUNTER — Ambulatory Visit (INDEPENDENT_AMBULATORY_CARE_PROVIDER_SITE_OTHER): Payer: BC Managed Care – PPO | Admitting: Family Medicine

## 2019-06-04 DIAGNOSIS — F419 Anxiety disorder, unspecified: Secondary | ICD-10-CM | POA: Insufficient documentation

## 2019-06-05 ENCOUNTER — Other Ambulatory Visit: Payer: Self-pay

## 2019-06-05 MED ORDER — OLOPATADINE HCL 0.1 % OP SOLN: 1.0000 [drp] | Freq: Two times a day (BID) | OPHTHALMIC | 5 refills | Status: DC

## 2019-06-17 DIAGNOSIS — M170 Bilateral primary osteoarthritis of knee: Secondary | ICD-10-CM | POA: Diagnosis not present

## 2019-06-24 ENCOUNTER — Ambulatory Visit (INDEPENDENT_AMBULATORY_CARE_PROVIDER_SITE_OTHER): Payer: BC Managed Care – PPO | Admitting: Family Medicine

## 2019-06-25 DIAGNOSIS — M5030 Other cervical disc degeneration, unspecified cervical region: Secondary | ICD-10-CM | POA: Diagnosis not present

## 2019-06-25 DIAGNOSIS — M5136 Other intervertebral disc degeneration, lumbar region: Secondary | ICD-10-CM | POA: Diagnosis not present

## 2019-06-29 ENCOUNTER — Ambulatory Visit (INDEPENDENT_AMBULATORY_CARE_PROVIDER_SITE_OTHER): Payer: BC Managed Care – PPO | Admitting: Family Medicine

## 2019-06-29 ENCOUNTER — Other Ambulatory Visit: Payer: Self-pay

## 2019-06-29 ENCOUNTER — Encounter (INDEPENDENT_AMBULATORY_CARE_PROVIDER_SITE_OTHER): Payer: Self-pay | Admitting: Family Medicine

## 2019-06-29 VITALS — BP 127/84 | HR 110 | Temp 98.1°F | Ht 62.0 in | Wt 271.0 lb

## 2019-06-29 DIAGNOSIS — E8881 Metabolic syndrome: Secondary | ICD-10-CM | POA: Diagnosis not present

## 2019-06-29 DIAGNOSIS — F419 Anxiety disorder, unspecified: Secondary | ICD-10-CM

## 2019-06-29 DIAGNOSIS — M549 Dorsalgia, unspecified: Secondary | ICD-10-CM | POA: Diagnosis not present

## 2019-06-29 DIAGNOSIS — Z9189 Other specified personal risk factors, not elsewhere classified: Secondary | ICD-10-CM | POA: Diagnosis not present

## 2019-06-29 DIAGNOSIS — E559 Vitamin D deficiency, unspecified: Secondary | ICD-10-CM | POA: Diagnosis not present

## 2019-06-29 DIAGNOSIS — Z6841 Body Mass Index (BMI) 40.0 and over, adult: Secondary | ICD-10-CM

## 2019-06-29 MED ORDER — BD PEN NEEDLE NANO U/F 32G X 4 MM MISC: 1.0000 | Freq: Every day | 0 refills | Status: DC

## 2019-06-29 MED ORDER — LIRAGLUTIDE 18 MG/3ML ~~LOC~~ SOPN: 1.2000 mg | PEN_INJECTOR | Freq: Every morning | SUBCUTANEOUS | 0 refills | Status: DC

## 2019-06-29 MED ORDER — VITAMIN D (ERGOCALCIFEROL) 1.25 MG (50000 UNIT) PO CAPS: 50000.0000 [IU] | ORAL_CAPSULE | ORAL | 0 refills | Status: DC

## 2019-06-30 ENCOUNTER — Encounter (INDEPENDENT_AMBULATORY_CARE_PROVIDER_SITE_OTHER): Payer: Self-pay | Admitting: Family Medicine

## 2019-06-30 LAB — CBC WITH DIFFERENTIAL/PLATELET
Basophils Absolute: 0.1 10*3/uL (ref 0.0–0.2)
Basos: 1 %
EOS (ABSOLUTE): 0.1 10*3/uL (ref 0.0–0.4)
Eos: 1 %
Hematocrit: 45 % (ref 34.0–46.6)
Hemoglobin: 14.3 g/dL (ref 11.1–15.9)
Immature Grans (Abs): 0.1 10*3/uL (ref 0.0–0.1)
Immature Granulocytes: 1 %
Lymphocytes Absolute: 2 10*3/uL (ref 0.7–3.1)
Lymphs: 23 %
MCH: 28.8 pg (ref 26.6–33.0)
MCHC: 31.8 g/dL (ref 31.5–35.7)
MCV: 91 fL (ref 79–97)
Monocytes Absolute: 0.6 10*3/uL (ref 0.1–0.9)
Monocytes: 6 %
Neutrophils Absolute: 6 10*3/uL (ref 1.4–7.0)
Neutrophils: 68 %
Platelets: 285 10*3/uL (ref 150–450)
RBC: 4.96 x10E6/uL (ref 3.77–5.28)
RDW: 13.7 % (ref 11.7–15.4)
WBC: 8.7 10*3/uL (ref 3.4–10.8)

## 2019-06-30 LAB — COMPREHENSIVE METABOLIC PANEL
ALT: 23 IU/L (ref 0–32)
AST: 7 IU/L (ref 0–40)
Albumin/Globulin Ratio: 1.4 (ref 1.2–2.2)
Albumin: 3.7 g/dL — ABNORMAL LOW (ref 3.8–4.8)
Alkaline Phosphatase: 99 IU/L (ref 39–117)
BUN/Creatinine Ratio: 13 (ref 9–23)
BUN: 10 mg/dL (ref 6–20)
Bilirubin Total: 0.3 mg/dL (ref 0.0–1.2)
CO2: 18 mmol/L — ABNORMAL LOW (ref 20–29)
Calcium: 8.8 mg/dL (ref 8.7–10.2)
Chloride: 106 mmol/L (ref 96–106)
Creatinine, Ser: 0.76 mg/dL (ref 0.57–1.00)
GFR calc Af Amer: 119 mL/min/{1.73_m2} (ref >59)
GFR calc non Af Amer: 103 mL/min/{1.73_m2} (ref >59)
Globulin, Total: 2.6 g/dL (ref 1.5–4.5)
Glucose: 86 mg/dL (ref 65–99)
Potassium: 4.3 mmol/L (ref 3.5–5.2)
Sodium: 140 mmol/L (ref 134–144)
Total Protein: 6.3 g/dL (ref 6.0–8.5)

## 2019-06-30 LAB — LIPID PANEL WITH LDL/HDL RATIO
Cholesterol, Total: 172 mg/dL (ref 100–199)
HDL: 49 mg/dL (ref >39)
LDL Chol Calc (NIH): 110 mg/dL — ABNORMAL HIGH (ref 0–99)
LDL/HDL Ratio: 2.2 ratio (ref 0.0–3.2)
Triglycerides: 65 mg/dL (ref 0–149)
VLDL Cholesterol Cal: 13 mg/dL (ref 5–40)

## 2019-06-30 LAB — INSULIN, RANDOM: INSULIN: 17.7 u[IU]/mL (ref 2.6–24.9)

## 2019-06-30 LAB — VITAMIN D 25 HYDROXY (VIT D DEFICIENCY, FRACTURES): Vit D, 25-Hydroxy: 33.8 ng/mL (ref 30.0–100.0)

## 2019-06-30 LAB — HEMOGLOBIN A1C
Est. average glucose Bld gHb Est-mCnc: 108 mg/dL
Hgb A1c MFr Bld: 5.4 % (ref 4.8–5.6)

## 2019-06-30 LAB — VITAMIN B12: Vitamin B-12: 426 pg/mL (ref 232–1245)

## 2019-06-30 NOTE — Progress Notes (Signed)
Office: [phone]  /  Fax: [phone]   HPI:   Chief Complaint: OBESITY Cynthia [name] is here to discuss her progress with her obesity treatment plan. She is on the keep a food journal with 1400 calories and 80 grams of protein daily or follow the Category 2 plan and is following her eating plan approximately 60 % of the time. She states she is exercising 0 minutes 0 times per week. Cynthia [name] had a recent cervical lumbar epidural steroid injection, and she feels that she is bloated from the steroids. She states Victoza is controlling her cravings and hunger. She is not exercising secondary to pain in both knees and back pain.  Her weight is 271 lb (122.9 kg) today and has had a weight loss of 2 pounds over a period of 9 months since her last visit. She has lost 0 lbs since starting treatment with us.  Insulin Resistance Cynthia [name] has a diagnosis of insulin resistance based on her elevated fasting insulin level >5. Although Cynthia [name]'s blood glucose readings are still under good control, insulin resistance puts her at greater risk of metabolic syndrome and diabetes. She is taking Victoza 0.9 mg and continues to work on diet and exercise to decrease risk of diabetes.  At risk for diabetes Cynthia [name] is at higher than average risk for developing diabetes due to her obesity and insulin resistance.   Vitamin D Deficiency Cynthia [name] has a diagnosis of vitamin D deficiency. She is currently taking prescription Vit D and denies nausea, vomiting or muscle weakness.  Anxiety Cynthia [name] has anxiety and notes her symptoms are stable with Prozac.  Back and Neck Pain Cynthia [name] complains of back and neck pain. She had a epidural steroid injection with Dr. Ethelene Halamos.  ASSESSMENT AND PLAN:  Insulin resistance - Plan: Lipid Panel With LDL/HDL Ratio, Insulin, random, HgB A1c, Comprehensive Metabolic Panel (CMET), CBC w/Diff/Platelet, liraglutide (VICTOZA) 18 MG/3ML SOPN, Insulin Pen Needle (BD PEN NEEDLE NANO U/F) 32G X 4 MM MISC  Vitamin  D deficiency - Plan: B12, Vitamin D (25 hydroxy), Vitamin D, Ergocalciferol, (DRISDOL) 1.25 MG (50000 UT) CAPS capsule  Anxiety  Back pain, unspecified back location, unspecified back pain laterality, unspecified chronicity  At risk for diabetes mellitus  Class 3 severe obesity with serious comorbidity and body mass index (BMI) of 45.0 to 49.9 in adult, unspecified obesity type (HCC)  PLAN:  Insulin Resistance Cynthia [name] will continue to work on weight loss, exercise, and decreasing simple carbohydrates to help decrease the risk of diabetes. Cynthia [name] agrees to continue Victoza 1.2 mg SubQ q AM #2 pens and we will refill for 1 month, and we will refill nano needles #100 with no refills. We will check labs today. Cynthia [name] agrees to follow up with our clinic in 2 weeks as directed to closely monitor her progress.  Diabetes risk counseling (~15 min) Cynthia [name] is a 33 y.o. female and has risk factors for diabetes including obesity and insulin resistance. We discussed intensive lifestyle modifications today with an emphasis on weight loss as well as increasing exercise and decreasing simple carbohydrates in her diet.  Vitamin D Deficiency Low vitamin D level contributes to fatigue and are associated with obesity, breast, and colon cancer. Cynthia [name] agrees to continue taking prescription Vit D 50,000 IU every week #4 and we will refill for 1 month. She will follow up for routine testing of vitamin D, at least 2-3 times per year to avoid over-replacement. We will check labs today. Cynthia [name] agrees to follow up with our clinic in 2  weeks.  Anxiety Cynthia [name] agrees to continue taking Prozac, and she agrees to follow up with our clinic in 2 weeks.  Back and Neck Pain  Cynthia [name] will continue to follow up with [place], we recommended physical therapy and pilates.  Obesity Cynthia [name] is currently in the action stage of change. As such, her goal is to continue with weight loss efforts She has agreed to keep a food journal with 1400 calories  and 80 grams of protein daily or follow the Category 2 plan Cynthia [name] has been instructed to work up to a goal of 150 minutes of combined cardio and strengthening exercise per week for weight loss and overall health benefits. We discussed the following Behavioral Modification Strategies today: increasing lean protein intake, decreasing simple carbohydrates, increasing vegetables, increase H20 intake, work on meal planning and easy cooking plans and holiday eating strategies    Cynthia [name] has agreed to follow up with our clinic in 2 weeks. She was informed of the importance of frequent follow up visits to maximize her success with intensive lifestyle modifications for her multiple health conditions.  ALLERGIES: Allergies  Allergen Reactions  . Aquacel [Carboxymethylcellulose] Itching, Rash and Other (See Comments)    Caused blisters  . Penicillins     Unknown reaction Has patient had a PCN reaction causing immediate rash, facial/tongue/throat swelling, SOB or lightheadedness with hypotension: Unknown Has patient had a PCN reaction causing severe rash involving mucus membranes or skin necrosis: Unknown Has patient had a PCN reaction that required hospitalization: No Has patient had a PCN reaction occurring within the last 10 years: No If all of the above answers are "NO", then may proceed with Cephalosporin use.   . Adhesive [Tape] Rash    Prefers to use paper tape  . Latex Rash  . Oxycodone Itching and Rash    MEDICATIONS: Current Outpatient Medications on File Prior to Visit  Medication Sig Dispense Refill  . acetaminophen (TYLENOL) 500 MG tablet Take 1,000 mg by mouth 3 (three) times daily as needed for moderate pain.     Marland Kitchen albuterol (PROAIR HFA) 108 (90 Base) MCG/ACT inhaler Inhale 1-2 puffs into the lungs every 6 (six) hours as needed for wheezing or shortness of breath. 18 g 1  . alum hydroxide-mag trisilicate (GAVISCON) 27-06 MG CHEW chewable tablet Chew 1-2 tablets by mouth daily as  needed for indigestion or heartburn.    . fexofenadine (ALLEGRA ALLERGY) 180 MG tablet Allegra Allergy    . FLUoxetine (PROZAC) 10 MG capsule Take 1 capsule (10 mg total) by mouth as directed. Take three tabs by mouth daily 90 capsule 0  . fluticasone furoate-vilanterol (BREO ELLIPTA) 200-25 MCG/INH AEPB Inhale 1 puff into the lungs daily. 60 each 5  . HYDROcodone-acetaminophen (NORCO) 10-325 MG tablet Take 1 tablet by mouth every 6 (six) hours as needed.    . hyoscyamine (LEVSIN SL) 0.125 MG SL tablet Take 0.125 mg by mouth 3 (three) times daily with meals.   0  . levocetirizine (XYZAL) 5 MG tablet TAKE 1 TABLET(5 MG) BY MOUTH EVERY EVENING 30 tablet 5  . meloxicam (MOBIC) 15 MG tablet     . methocarbamol (ROBAXIN) 500 MG tablet Take 500 mg by mouth 2 (two) times daily as needed for muscle spasms.     . montelukast (SINGULAIR) 10 MG tablet Take 1 tablet (10 mg total) by mouth at bedtime. 30 tablet 5  . norethindrone-ethinyl estradiol (MICROGESTIN FE 1/20) 1-20 MG-MCG tablet Take 1 tablet by mouth daily. (Patient taking differently: Take  1 tablet by mouth at bedtime. ) 3 Package 4  . olopatadine (PATANOL) 0.1 % ophthalmic solution Place 1 drop into both eyes 2 (two) times daily. 5 mL 5  . QNASL 80 MCG/ACT AERS USE 1 SPRAY IN EACH NOSTRIL TWICE DAILY AS NEEDED FOR ALLERGIES 10.6 g 5  . traMADol (ULTRAM) 50 MG tablet Take by mouth every 6 (six) hours as needed.    . traZODone (DESYREL) 100 MG tablet Take 100 mg by mouth at bedtime.      No current facility-administered medications on file prior to visit.     PAST MEDICAL HISTORY: Past Medical History:  Diagnosis Date  . ADHD (attention deficit hyperactivity disorder)   . Allergic rhinitis   . Anxiety   . Asthma   . Back injury    COMPETITIVE INJURY IN HIGH SCHOOL  . Back pain   . Depression   . Fatty liver   . Fibromyalgia   . Gallbladder problem   . GERD (gastroesophageal reflux disease)   . IBS (irritable bowel syndrome)   .  Insulin resistance   . Joint pain   . Migraines   . OA (osteoarthritis) of knee   . Obesity   . Pneumonia   . PONV (postoperative nausea and vomiting)   . Seasonal allergies   . Vitamin D deficiency     PAST SURGICAL HISTORY: Past Surgical History:  Procedure Laterality Date  . CHOLECYSTECTOMY    . DG DILATION URETERS    . KNEE ARTHROSCOPY Left   . KNEE ARTHROSCOPY WITH LATERAL MENISECTOMY Right 04/11/2018   Procedure: RIGHT KNEE ARTHROSCOPY WITH PARTIAL MENISECTOMY CHONDROPLASTY;  Surgeon: Eugenia Mcalpine, MD;  Location: [facility];  Service: Orthopedics;  Laterality: Right;  . MOUTH SURGERY    . SHOULDER SURGERY  2002   RIGHT  . WISDOM TOOTH EXTRACTION    . WRIST ARTHROSCOPY     Right    SOCIAL HISTORY: Social History   Tobacco Use  . Smoking status: Never Smoker  . Smokeless tobacco: Never Used  Substance Use Topics  . Alcohol use: Yes    Comment: social  . Drug use: No    FAMILY HISTORY: Family History  Problem Relation Age of Onset  . Multiple sclerosis Mother   . Diabetes Mother   . Hyperlipidemia Mother   . Thyroid disease Mother   . Depression Mother   . Anxiety disorder Mother   . Obesity Mother   . Allergic rhinitis Mother   . Hypertension Father   . Diabetes Father   . Hyperlipidemia Father   . Obesity Father   . Allergic rhinitis Father   . Diabetes Maternal Grandfather   . Cancer Maternal Grandfather        prostate  . Heart disease Maternal Grandfather   . Heart disease Paternal Grandfather   . Allergic rhinitis Paternal Aunt   . Allergic rhinitis Maternal Grandmother   . Asthma Neg Hx   . Eczema Neg Hx   . Urticaria Neg Hx     ROS: Review of Systems  Constitutional: Positive for weight loss.  Gastrointestinal: Negative for nausea and vomiting.  Genitourinary: Negative for frequency.  Musculoskeletal: Positive for back pain and neck pain.       Negative muscle weakness  Endo/Heme/Allergies: Negative for polydipsia.    Psychiatric/Behavioral:       + Anxiety    PHYSICAL EXAM: Blood pressure 127/84, pulse (!) 110, temperature 98.1 F (36.7 C), temperature source Oral, height  (1.575 m),  weight 271 lb (122.9 kg), SpO2 96 %. Body mass index is 49.57 kg/m. Physical Exam Vitals signs reviewed.  Constitutional:      Appearance: Normal appearance. She is obese.  Cardiovascular:     Rate and Rhythm: Normal rate.     Pulses: Normal pulses.  Pulmonary:     Effort: Pulmonary effort is normal.  Musculoskeletal: Normal range of motion.  Skin:    General: Skin is warm and dry.  Neurological:     Mental Status: She is alert and oriented to person, place, and time.  Psychiatric:        Mood and Affect: Mood normal.        Behavior: Behavior normal.     RECENT LABS AND TESTS: BMET    Component Value Date/Time   NA 140 06/29/2019 1103   K 4.3 06/29/2019 1103   CL 106 06/29/2019 1103   CO2 18 (L) 06/29/2019 1103   GLUCOSE 86 06/29/2019 1103   GLUCOSE 87 04/08/2018 1420   BUN 10 06/29/2019 1103   CREATININE 0.76 06/29/2019 1103   CALCIUM 8.8 06/29/2019 1103   GFRNONAA 103 06/29/2019 1103   GFRAA 119 06/29/2019 1103   Lab Results  Component Value Date   HGBA1C 5.4 06/29/2019   HGBA1C 5.4 10/30/2018   HGBA1C 5.5 01/16/2018   HGBA1C 5.4 10/03/2017   HGBA1C 5.3 05/01/2017   Lab Results  Component Value Date   INSULIN WILL FOLLOW 06/29/2019   INSULIN 29.3 (H) 10/30/2018   INSULIN 12.9 01/16/2018   INSULIN 21.7 10/03/2017   INSULIN 11.1 05/01/2017   CBC    Component Value Date/Time   WBC 8.7 06/29/2019 1103   WBC 10.5 04/08/2018 1420   RBC 4.96 06/29/2019 1103   RBC 5.14 (H) 04/08/2018 1420   HGB 14.3 06/29/2019 1103   HCT 45.0 06/29/2019 1103   PLT 285 06/29/2019 1103   MCV 91 06/29/2019 1103   MCH 28.8 06/29/2019 1103   MCH 30.2 04/08/2018 1420   MCHC 31.8 06/29/2019 1103   MCHC 32.8 04/08/2018 1420   RDW 13.7 06/29/2019 1103   LYMPHSABS 2.0 06/29/2019 1103   MONOABS 0.7  01/09/2012 0956   EOSABS 0.1 06/29/2019 1103   BASOSABS 0.1 06/29/2019 1103   Iron/TIBC/Ferritin/ %Sat No results found for: IRON, TIBC, FERRITIN, IRONPCTSAT Lipid Panel     Component Value Date/Time   CHOL 172 06/29/2019 1103   TRIG 65 06/29/2019 1103   HDL 49 06/29/2019 1103   CHOLHDL 3.4 01/09/2012 0956   VLDL 14 01/09/2012 0956   LDLCALC 110 (H) 06/29/2019 1103   Hepatic Function Panel     Component Value Date/Time   PROT 6.3 06/29/2019 1103   ALBUMIN 3.7 (L) 06/29/2019 1103   AST 7 06/29/2019 1103   ALT 23 06/29/2019 1103   ALKPHOS 99 06/29/2019 1103   BILITOT 0.3 06/29/2019 1103      Component Value Date/Time   TSH 1.640 05/01/2017 1009   TSH 1.850 01/09/2012 0956      OBESITY BEHAVIORAL INTERVENTION VISIT  Today's visit was # 39   Starting weight: 264 lbs Starting date: 05/01/17 Today's weight : 271 lbs  Today's date: 06/29/2019 Total lbs lost to date: 0    ASK: We discussed the diagnosis of obesity with Cynthia [name] today and Cynthia [name] agreed to give [place] permission to discuss obesity behavioral modification therapy today.  ASSESS: Cynthia [name] has the diagnosis of obesity and her BMI today is 49.55 Cynthia [name] is in the action stage of change  ADVISE: Cynthia [name] was educated on the multiple health risks of obesity as well as the benefit of weight loss to improve her health. She was advised of the need for long term treatment and the importance of lifestyle modifications to improve her current health and to decrease her risk of future health problems.  AGREE: Multiple dietary modification options and treatment options were discussed and  Cynthia [name] agreed to follow the recommendations documented in the above note.  ARRANGE: Cynthia [name] was educated on the importance of frequent visits to treat obesity as outlined per CMS and USPSTF guidelines and agreed to schedule her next follow up appointment today.  Trude Mcburney, am acting as transcriptionist for Helane Rima, DO  I  have reviewed the above documentation for accuracy and completeness, and I agree with the above. Helane Rima, DO

## 2019-07-07 ENCOUNTER — Encounter (INDEPENDENT_AMBULATORY_CARE_PROVIDER_SITE_OTHER): Payer: Self-pay | Admitting: Family Medicine

## 2019-07-07 NOTE — Telephone Encounter (Signed)
Please advise 

## 2019-07-08 DIAGNOSIS — M1712 Unilateral primary osteoarthritis, left knee: Secondary | ICD-10-CM | POA: Diagnosis not present

## 2019-07-15 DIAGNOSIS — M1712 Unilateral primary osteoarthritis, left knee: Secondary | ICD-10-CM | POA: Diagnosis not present

## 2019-07-22 ENCOUNTER — Other Ambulatory Visit (INDEPENDENT_AMBULATORY_CARE_PROVIDER_SITE_OTHER): Payer: Self-pay

## 2019-07-22 DIAGNOSIS — M1712 Unilateral primary osteoarthritis, left knee: Secondary | ICD-10-CM | POA: Diagnosis not present

## 2019-07-22 DIAGNOSIS — F419 Anxiety disorder, unspecified: Secondary | ICD-10-CM

## 2019-07-22 MED ORDER — FLUOXETINE HCL 10 MG PO CAPS: 10.0000 mg | ORAL_CAPSULE | ORAL | 0 refills | Status: DC

## 2019-07-29 ENCOUNTER — Other Ambulatory Visit: Payer: Self-pay

## 2019-07-29 ENCOUNTER — Ambulatory Visit (INDEPENDENT_AMBULATORY_CARE_PROVIDER_SITE_OTHER): Payer: BC Managed Care – PPO | Admitting: Family Medicine

## 2019-07-29 ENCOUNTER — Encounter (INDEPENDENT_AMBULATORY_CARE_PROVIDER_SITE_OTHER): Payer: Self-pay | Admitting: Family Medicine

## 2019-07-29 VITALS — BP 123/84 | HR 124 | Temp 98.4°F | Ht 62.0 in | Wt 268.0 lb

## 2019-07-29 DIAGNOSIS — F419 Anxiety disorder, unspecified: Secondary | ICD-10-CM

## 2019-07-29 DIAGNOSIS — Z9189 Other specified personal risk factors, not elsewhere classified: Secondary | ICD-10-CM | POA: Diagnosis not present

## 2019-07-29 DIAGNOSIS — M549 Dorsalgia, unspecified: Secondary | ICD-10-CM

## 2019-07-29 DIAGNOSIS — E559 Vitamin D deficiency, unspecified: Secondary | ICD-10-CM

## 2019-07-29 DIAGNOSIS — G8929 Other chronic pain: Secondary | ICD-10-CM

## 2019-07-29 DIAGNOSIS — E8881 Metabolic syndrome: Secondary | ICD-10-CM | POA: Diagnosis not present

## 2019-07-29 DIAGNOSIS — R7303 Prediabetes: Secondary | ICD-10-CM

## 2019-07-29 DIAGNOSIS — Z6841 Body Mass Index (BMI) 40.0 and over, adult: Secondary | ICD-10-CM

## 2019-07-30 NOTE — Progress Notes (Addendum)
Chief Complaint: OBESITY Cynthia [name] is here to discuss her progress with her obesity treatment plan along with follow-up of her obesity related diagnoses. Cynthia [name] is on the Category 2 Plan and is also journaling 1400 calories and 80g of protein and states she is following her eating plan approximately 30% of the time. Cynthia [name] states she is not exercising at this time.  Today's visit was #: 39 Starting weight: 264 lbs Starting date: 05/01/2017 Today's weight: 268 lbs Today's date: 07/30/2019 Total lbs lost to date: 0 Total lbs lost since last in-office visit: 3 lbs  Interim History: Cynthia [name] states she is not eating regularly.  Her 24 hour recall shows toast and coffee in the morning, a tuna packet in the afternoon, and a small slice of pizza in the evening.  Subjective:   1. PreDM Cynthia [name]'s last insulin level was 17.7 on 06/29/2019.  This is improved from 29.3.  She is taking Victoza 0.9 mg daily.  2. At risk for diabetes mellitus Cynthia [name] is at higher than average risk for developing diabetes due to her obesity.   3. Vitamin D deficiency Her last vitamin D lab was 33.8 on 06/29/2019.  She is taking prescription vitamin D weekly.  4. Anxiety Cynthia [name] states her anxiety is currently stable.  She continues to take Prozac.  5. Chronic back pain, unspecified back location, unspecified back pain laterality She is followed by Dr. Ethelene [name] for epidural steroid injections.  She has had 2 injections since her last visit.  Assessment/Plan:   1. PreDM Cynthia [name] will continue to work on weight loss, exercise, and decreasing simple carbohydrates to help decrease the risk of diabetes. Cynthia [name] agreed to follow-up with [place] as directed to closely monitor her progress.  We will increase her Victoza to 1.2 mg daily.  2. At risk for diabetes mellitus Cynthia [name] is a 34 y.o. female and has risk factors for diabetes including obesity. We discussed intensive lifestyle modifications today with an emphasis on weight loss as  well as increasing exercise and decreasing simple carbohydrates in her diet. (15 minutes)  3. Vitamin D deficiency Low Vitamin D level contributes to fatigue and are associated with obesity, breast, and colon cancer. She agrees to continue to take prescription Vitamin D @50 ,000 IU every week and will follow-up for routine testing of vitamin D, at least 2-3 times per year to avoid over-replacement.  4. Anxiety Stable at this time.  Will monitor.  5. Chronic back pain, unspecified back location, unspecified back pain laterality Will follow.  6. Class 3 severe obesity with serious comorbidity and body mass index (BMI) of 45.0 to 49.9 in adult, unspecified obesity type Cynthia [name] is currently in the action stage of change. As such, her goal is to continue with weight loss efforts. She has agreed to Category 3 Plan and also keeping a food journal of 1400 calories and 80g of protein.   We discussed the following exercise goals today: For substantial health benefits, adults should do at least 150 minutes (2 hours and 30 minutes) a week of moderate-intensity, or 75 minutes (1 hour and 15 minutes) a week of vigorous-intensity aerobic physical activity, or an equivalent combination of moderate- and vigorous-intensity aerobic activity. Aerobic activity should be performed in episodes of at least 10 minutes, and preferably, it should be spread throughout the week. Adults should also include muscle-strengthening activities that involve all major muscle groups on 2 or more days a week.  We discussed the following behavioral modification strategies today: meal  planning and cooking strategies, planning for success and keeping a strict food journal.  Cynthia [name] has agreed to follow-up with our clinic in 2 weeks. She was informed of the importance of frequent follow-up visits to maximize her success with intensive lifestyle modifications for her multiple health conditions.  Objective:   Blood pressure  123/84, pulse (!) 124, temperature 98.4 F (36.9 C), temperature source Oral, height 5\' 2"  (1.575 m), weight 268 lb (121.6 kg), SpO2 96 %. Body mass index is 49.02 kg/m.  General: Cooperative, alert, well developed, in no acute distress. HEENT: Conjunctivae and lids unremarkable. Neck: No thyromegaly.  Cardiovascular: Regular rhythm.  Lungs: Normal work of breathing. Extremities: No edema.  Neurologic: No focal deficits.   Lab Results  Component Value Date   CREATININE 0.76 06/29/2019   BUN 10 06/29/2019   NA 140 06/29/2019   K 4.3 06/29/2019   CL 106 06/29/2019   CO2 18 (L) 06/29/2019   Lab Results  Component Value Date   ALT 23 06/29/2019   AST 7 06/29/2019   ALKPHOS 99 06/29/2019   BILITOT 0.3 06/29/2019   Lab Results  Component Value Date   HGBA1C 5.4 06/29/2019   HGBA1C 5.4 10/30/2018   HGBA1C 5.5 01/16/2018   HGBA1C 5.4 10/03/2017   HGBA1C 5.3 05/01/2017   Lab Results  Component Value Date   INSULIN 17.7 06/29/2019   INSULIN 29.3 (H) 10/30/2018   INSULIN 12.9 01/16/2018   INSULIN 21.7 10/03/2017   INSULIN 11.1 05/01/2017   Lab Results  Component Value Date   TSH 1.640 05/01/2017   Lab Results  Component Value Date   CHOL 172 06/29/2019   HDL 49 06/29/2019   LDLCALC 110 (H) 06/29/2019   TRIG 65 06/29/2019   CHOLHDL 3.4 01/09/2012   Lab Results  Component Value Date   WBC 8.7 06/29/2019   HGB 14.3 06/29/2019   HCT 45.0 06/29/2019   MCV 91 06/29/2019   PLT 285 06/29/2019   Attestation Statements:   Reviewed by clinician on day of visit: allergies, medications, problem list, medical history, surgical history, family history, social history and previous encounter notes.  This visit occurred during the SARS-CoV-2 public health emergency. Safety protocols were in place, including screening questions prior to the visit, additional usage of staff PPE, and extensive cleaning of exam room while observing appropriate contact time as indicated for  disinfecting solutions. (CPT [id number])  I, Water quality scientist, CMA, am acting as transcriptionist for [employer], DO.  I have reviewed the above documentation for accuracy and completeness, and I agree with the above. Briscoe Deutscher, DO

## 2019-08-04 ENCOUNTER — Encounter (INDEPENDENT_AMBULATORY_CARE_PROVIDER_SITE_OTHER): Payer: Self-pay | Admitting: Family Medicine

## 2019-08-04 ENCOUNTER — Other Ambulatory Visit (INDEPENDENT_AMBULATORY_CARE_PROVIDER_SITE_OTHER): Payer: Self-pay

## 2019-08-04 DIAGNOSIS — E8881 Metabolic syndrome: Secondary | ICD-10-CM

## 2019-08-04 MED ORDER — LIRAGLUTIDE 18 MG/3ML ~~LOC~~ SOPN: 1.2000 mg | PEN_INJECTOR | Freq: Every morning | SUBCUTANEOUS | 0 refills | Status: DC

## 2019-08-06 ENCOUNTER — Other Ambulatory Visit: Payer: Self-pay | Admitting: Allergy and Immunology

## 2019-08-12 DIAGNOSIS — M170 Bilateral primary osteoarthritis of knee: Secondary | ICD-10-CM | POA: Diagnosis not present

## 2019-08-17 ENCOUNTER — Other Ambulatory Visit: Payer: Self-pay

## 2019-08-17 ENCOUNTER — Encounter (INDEPENDENT_AMBULATORY_CARE_PROVIDER_SITE_OTHER): Payer: Self-pay | Admitting: Family Medicine

## 2019-08-17 ENCOUNTER — Ambulatory Visit (INDEPENDENT_AMBULATORY_CARE_PROVIDER_SITE_OTHER): Payer: BC Managed Care – PPO | Admitting: Family Medicine

## 2019-08-17 VITALS — BP 126/87 | HR 118 | Temp 98.5°F | Ht 62.0 in | Wt 272.0 lb

## 2019-08-17 DIAGNOSIS — M549 Dorsalgia, unspecified: Secondary | ICD-10-CM

## 2019-08-17 DIAGNOSIS — R7303 Prediabetes: Secondary | ICD-10-CM

## 2019-08-17 DIAGNOSIS — G8929 Other chronic pain: Secondary | ICD-10-CM

## 2019-08-17 DIAGNOSIS — E8881 Metabolic syndrome: Secondary | ICD-10-CM

## 2019-08-17 DIAGNOSIS — E559 Vitamin D deficiency, unspecified: Secondary | ICD-10-CM

## 2019-08-17 DIAGNOSIS — F419 Anxiety disorder, unspecified: Secondary | ICD-10-CM

## 2019-08-17 DIAGNOSIS — K588 Other irritable bowel syndrome: Secondary | ICD-10-CM | POA: Diagnosis not present

## 2019-08-17 DIAGNOSIS — F988 Other specified behavioral and emotional disorders with onset usually occurring in childhood and adolescence: Secondary | ICD-10-CM

## 2019-08-17 DIAGNOSIS — Z6841 Body Mass Index (BMI) 40.0 and over, adult: Secondary | ICD-10-CM

## 2019-08-17 MED ORDER — SAXENDA 18 MG/3ML ~~LOC~~ SOPN: 3.0000 mg | PEN_INJECTOR | Freq: Every day | SUBCUTANEOUS | 0 refills | Status: DC

## 2019-08-17 MED ORDER — LISDEXAMFETAMINE DIMESYLATE 20 MG PO CAPS: 20.0000 mg | ORAL_CAPSULE | Freq: Every day | ORAL | 0 refills | Status: DC

## 2019-08-17 MED ORDER — BD PEN NEEDLE NANO U/F 32G X 4 MM MISC: 1.0000 | Freq: Every day | 0 refills | Status: DC

## 2019-08-17 MED ORDER — VITAMIN D (ERGOCALCIFEROL) 1.25 MG (50000 UNIT) PO CAPS: 50000.0000 [IU] | ORAL_CAPSULE | ORAL | 0 refills | Status: DC

## 2019-08-17 NOTE — Progress Notes (Signed)
Chief Complaint:   OBESITY Cynthia [name] is here to discuss her progress with her obesity treatment plan along with follow-up of her obesity related diagnoses. Elijah is on the Category 3 Plan and states she is following her eating plan approximately 75% of the time. Lashea states she is exercising for 0 minutes 0 times per week.  Today's visit was #: 40 Starting weight: 264 lbs Starting date: 05/01/2017 Today's weight: 272 lbs Today's date: 08/17/2019 Total lbs lost to date: 0 Total lbs lost since last in-office visit: 0  Interim History: Unfortunately, Victoza is no longer covered by [employer].  She had to wean off of it.  Her weight has increased, and diarrhea has also increased.  Subjective:   1. Prediabetes Cynthia [name] has a diagnosis of prediabetes based on her elevated HgA1c and was informed this puts her at greater risk of developing diabetes. She continues to work on diet and exercise to decrease her risk of diabetes. She denies nausea or hypoglycemia.  She was taking Victoza, but it is no longer covered by her insurance.  Lab Results  Component Value Date   HGBA1C 5.4 06/29/2019   Lab Results  Component Value Date   INSULIN 17.7 06/29/2019   INSULIN 29.3 (H) 10/30/2018   INSULIN 12.9 01/16/2018   INSULIN 21.7 10/03/2017   INSULIN 11.1 05/01/2017   2. Vitamin D deficiency Cynthia [name]'s Vitamin D level was 33.8 on 06/29/2019. She is currently taking vit D. She denies nausea, vomiting or muscle weakness.  3. Other irritable bowel syndrome Cynthia [name] has never been formally diagnosed with this, but states it has been worse since having her gallbladder removed.  Victoza made it better.  4. Chronic back pain, unspecified back location, unspecified back pain laterality She is followed by Dr. Ethelene Hal for ESI.  5. Anxiety Cynthia [name] continues to take Prozac for her anxiety.  6. Attention deficit disorder, unspecified hyperactivity presence Merit was treated as a child for ADD with  Adderall.  7. Insulin resistance Cayci has a diagnosis of insulin resistance based on her elevated fasting insulin level >5. She continues to work on diet and exercise to decrease her risk of diabetes.  Lab Results  Component Value Date   INSULIN 17.7 06/29/2019   INSULIN 29.3 (H) 10/30/2018   INSULIN 12.9 01/16/2018   INSULIN 21.7 10/03/2017   INSULIN 11.1 05/01/2017   Lab Results  Component Value Date   HGBA1C 5.4 06/29/2019   Assessment/Plan:   1. Prediabetes Zema will continue to work on weight loss, exercise, and decreasing simple carbohydrates to help decrease the risk of diabetes.  - Liraglutide -Weight Management (SAXENDA) 18 MG/3ML SOPN; Inject 3 mg into the skin daily.  Dispense: 5 pen; Refill: 0 - Insulin Pen Needle (BD PEN NEEDLE NANO U/F) 32G X 4 MM MISC; 1 Device by Does not apply route daily.  Dispense: 100 each; Refill: 0  2. Vitamin D deficiency Low Vitamin D level contributes to fatigue and are associated with obesity, breast, and colon cancer. She agrees to continue to take prescription Vitamin D @50 ,000 IU every week and will follow-up for routine testing of Vitamin D, at least 2-3 times per year to avoid over-replacement. - Vitamin D, Ergocalciferol, (DRISDOL) 1.25 MG (50000 UNIT) CAPS capsule; Take 1 capsule (50,000 Units total) by mouth every 7 (seven) days.  Dispense: 4 capsule; Refill: 0  3. Other irritable bowel syndrome Consider GI referral in the future.  4. Chronic pain Dr. is sending the patient to [facility].  5. Anxiety Doaa should continue her current medication.  6. Attention deficit disorder, unspecified hyperactivity presence Cynthia [name] will start Vyvanse 20 mg daily for ADD.  7. Insulin resistance Cynthia [name] will continue to work on weight loss, exercise, and decreasing simple carbohydrates to help decrease the risk of diabetes. Cynthia [name] agreed to follow-up with [place] as directed to closely monitor her progress.  8. Class 3 severe obesity with  serious comorbidity and body mass index (BMI) of 45.0 to 49.9 in adult, unspecified obesity type [facility]) Cynthia [name] is currently in the action stage of change. As such, her goal is to continue with weight loss efforts. She has agreed to the Category 3 Plan.   Exercise goals: No exercise has been prescribed at this time.  Behavioral modification strategies: increasing lean protein intake, decreasing simple carbohydrates, increasing vegetables and increasing water intake.  Cynthia [name] has agreed to follow-up with our clinic in 2 weeks. She was informed of the importance of frequent follow-up visits to maximize her success with intensive lifestyle modifications for her multiple health conditions.   Objective:   Blood pressure 126/87, pulse (!) 118, temperature 98.5 F (36.9 C), temperature source Oral, height 5\' 2"  (1.575 m), weight 272 lb (123.4 kg), last menstrual period 07/22/2019, SpO2 96 %. Body mass index is 49.75 kg/m.  General: Cooperative, alert, well developed, in no acute distress. HEENT: Conjunctivae and lids unremarkable. Cardiovascular: Regular rhythm.  Lungs: Normal work of breathing. Neurologic: No focal deficits.   Lab Results  Component Value Date   CREATININE 0.76 06/29/2019   BUN 10 06/29/2019   NA 140 06/29/2019   K 4.3 06/29/2019   CL 106 06/29/2019   CO2 18 (L) 06/29/2019   Lab Results  Component Value Date   ALT 23 06/29/2019   AST 7 06/29/2019   ALKPHOS 99 06/29/2019   BILITOT 0.3 06/29/2019   Lab Results  Component Value Date   HGBA1C 5.4 06/29/2019   HGBA1C 5.4 10/30/2018   HGBA1C 5.5 01/16/2018   HGBA1C 5.4 10/03/2017   HGBA1C 5.3 05/01/2017   Lab Results  Component Value Date   INSULIN 17.7 06/29/2019   INSULIN 29.3 (H) 10/30/2018   INSULIN 12.9 01/16/2018   INSULIN 21.7 10/03/2017   INSULIN 11.1 05/01/2017   Lab Results  Component Value Date   TSH 1.640 05/01/2017   Lab Results  Component Value Date   CHOL 172 06/29/2019   HDL 49 06/29/2019    LDLCALC 110 (H) 06/29/2019   TRIG 65 06/29/2019   CHOLHDL 3.4 01/09/2012   Lab Results  Component Value Date   WBC 8.7 06/29/2019   HGB 14.3 06/29/2019   HCT 45.0 06/29/2019   MCV 91 06/29/2019   PLT 285 06/29/2019   Attestation Statements:   Reviewed by clinician on day of visit: allergies, medications, problem list, medical history, surgical history, family history, social history, and previous encounter notes.  I performed a medically necessary appropriate examination and/or evaluation. I discussed the assessment and treatment plan with the patient. The patient was provided an opportunity to ask questions and all were answered. The patient agreed with the plan and demonstrated an understanding of the instructions. Clinical information was updated and documented in the EMR. Time spent on visit including pre-visit chart review and post-visit care was 50 minutes.  I, Water quality scientist, CMA, am acting as Location manager for [employer], DO.  I have reviewed the above documentation for accuracy and completeness, and I agree with the above. Briscoe Deutscher, DO

## 2019-08-18 ENCOUNTER — Encounter (INDEPENDENT_AMBULATORY_CARE_PROVIDER_SITE_OTHER): Payer: Self-pay | Admitting: Family Medicine

## 2019-08-18 NOTE — Telephone Encounter (Signed)
Please advise 

## 2019-08-19 ENCOUNTER — Encounter (INDEPENDENT_AMBULATORY_CARE_PROVIDER_SITE_OTHER): Payer: Self-pay | Admitting: Family Medicine

## 2019-08-19 MED ORDER — FLUOXETINE HCL 10 MG PO CAPS: 10.0000 mg | ORAL_CAPSULE | ORAL | 0 refills | Status: DC

## 2019-08-24 ENCOUNTER — Encounter (INDEPENDENT_AMBULATORY_CARE_PROVIDER_SITE_OTHER): Payer: Self-pay

## 2019-08-25 DIAGNOSIS — M170 Bilateral primary osteoarthritis of knee: Secondary | ICD-10-CM | POA: Diagnosis not present

## 2019-08-25 DIAGNOSIS — Z6841 Body Mass Index (BMI) 40.0 and over, adult: Secondary | ICD-10-CM | POA: Diagnosis not present

## 2019-09-08 ENCOUNTER — Ambulatory Visit (INDEPENDENT_AMBULATORY_CARE_PROVIDER_SITE_OTHER): Payer: BC Managed Care – PPO | Admitting: Family Medicine

## 2019-09-17 ENCOUNTER — Ambulatory Visit (INDEPENDENT_AMBULATORY_CARE_PROVIDER_SITE_OTHER): Payer: BC Managed Care – PPO | Admitting: Family Medicine

## 2019-09-17 DIAGNOSIS — F900 Attention-deficit hyperactivity disorder, predominantly inattentive type: Secondary | ICD-10-CM | POA: Insufficient documentation

## 2019-09-17 DIAGNOSIS — F988 Other specified behavioral and emotional disorders with onset usually occurring in childhood and adolescence: Secondary | ICD-10-CM | POA: Insufficient documentation

## 2019-09-21 ENCOUNTER — Ambulatory Visit (INDEPENDENT_AMBULATORY_CARE_PROVIDER_SITE_OTHER): Payer: BC Managed Care – PPO | Admitting: Family Medicine

## 2019-09-24 ENCOUNTER — Ambulatory Visit (INDEPENDENT_AMBULATORY_CARE_PROVIDER_SITE_OTHER): Payer: BC Managed Care – PPO | Admitting: Family Medicine

## 2019-09-24 ENCOUNTER — Encounter (INDEPENDENT_AMBULATORY_CARE_PROVIDER_SITE_OTHER): Payer: Self-pay | Admitting: Family Medicine

## 2019-09-24 ENCOUNTER — Other Ambulatory Visit: Payer: Self-pay

## 2019-09-24 VITALS — BP 136/81 | HR 137 | Temp 98.7°F | Ht 62.0 in | Wt 270.0 lb

## 2019-09-24 DIAGNOSIS — F988 Other specified behavioral and emotional disorders with onset usually occurring in childhood and adolescence: Secondary | ICD-10-CM

## 2019-09-24 DIAGNOSIS — G8929 Other chronic pain: Secondary | ICD-10-CM

## 2019-09-24 DIAGNOSIS — Z9189 Other specified personal risk factors, not elsewhere classified: Secondary | ICD-10-CM | POA: Diagnosis not present

## 2019-09-24 DIAGNOSIS — R7303 Prediabetes: Secondary | ICD-10-CM

## 2019-09-24 DIAGNOSIS — Z6841 Body Mass Index (BMI) 40.0 and over, adult: Secondary | ICD-10-CM

## 2019-09-24 DIAGNOSIS — E559 Vitamin D deficiency, unspecified: Secondary | ICD-10-CM

## 2019-09-24 DIAGNOSIS — M25561 Pain in right knee: Secondary | ICD-10-CM | POA: Diagnosis not present

## 2019-09-24 DIAGNOSIS — M25562 Pain in left knee: Secondary | ICD-10-CM

## 2019-09-24 MED ORDER — LISDEXAMFETAMINE DIMESYLATE 20 MG PO CAPS: 20.0000 mg | ORAL_CAPSULE | Freq: Every day | ORAL | 0 refills | Status: DC

## 2019-09-24 MED ORDER — SAXENDA 18 MG/3ML ~~LOC~~ SOPN: 3.0000 mg | PEN_INJECTOR | Freq: Every day | SUBCUTANEOUS | 0 refills | Status: DC

## 2019-09-24 MED ORDER — BD PEN NEEDLE NANO U/F 32G X 4 MM MISC: 1.0000 | Freq: Every day | 0 refills | Status: DC

## 2019-09-24 NOTE — Progress Notes (Signed)
Chief Complaint:   OBESITY Cynthia [name] is here to discuss her progress with her obesity treatment plan along with follow-up of her obesity related diagnoses. Cynthia [name] is on the Category 2 Plan and states she is following her eating plan approximately 70% of the time. Cynthia [name] states she is exercising for 0 minutes 0 times per week.  Today's visit was #: 19 Starting weight: 264 lbs Starting date: 05/01/2017 Today's weight: 270 lbs Today's date: 09/24/2019 Total lbs lost to date: 0 Total lbs lost since last in-office visit: 2 lbs  Interim History: Shatora's average calorie intake is 1400 per day and 80-90 grams of protein.  She is hoping to get into aquatic therapy soon.  She is having a procedure done to her knees next week at [facility].  Subjective:   1. Prediabetes Cynthia [name] has a diagnosis of prediabetes based on her elevated HgA1c and was informed this puts her at greater risk of developing diabetes. She continues to work on diet and exercise to decrease her risk of diabetes. She denies nausea or hypoglycemia.  Lab Results  Component Value Date   HGBA1C 5.4 06/29/2019   Lab Results  Component Value Date   INSULIN 17.7 06/29/2019   INSULIN 29.3 (H) 10/30/2018   INSULIN 12.9 01/16/2018   INSULIN 21.7 10/03/2017   INSULIN 11.1 05/01/2017   2. Attention deficit disorder, unspecified hyperactivity presence Rena is taking Vyvanse for ADHD.  3. Vitamin D deficiency Cynthia [name]'s Vitamin D level was 33.8 on 06/29/2019. She is currently taking vit D. She denies nausea, vomiting or muscle weakness.  4. Chronic pain of both knees Cynthia [name] is seeing a specialist at [facility] next week on Friday.  5. At risk for deficient intake of food The patient is at a higher than average risk of deficient intake of food.  Assessment/Plan:   1. Prediabetes Emmakate will continue to work on weight loss, exercise, and decreasing simple carbohydrates to help decrease the risk of diabetes.   Orders - Liraglutide -Weight  Management (SAXENDA) 18 MG/3ML SOPN; Inject 0.5 mLs (3 mg total) into the skin daily.  Dispense: 5 pen; Refill: 0 - Insulin Pen Needle (BD PEN NEEDLE NANO U/F) 32G X 4 MM MISC; 1 Device by Does not apply route daily.  Dispense: 100 each; Refill: 0  2. Attention deficit disorder Current treatment plan is effective, no change in therapy. Counseling: Intensive lifestyle modifications are the first line treatment for this issue. We discussed several lifestyle modifications today and she will continue to work on diet, exercise and weight loss efforts. We will continue to monitor. Orders and follow up as documented in patient record.  Orders - lisdexamfetamine (VYVANSE) 20 MG capsule; Take 1 capsule (20 mg total) by mouth daily.  Dispense: 30 capsule; Refill: 0  3. Vitamin D deficiency Low Vitamin D level contributes to fatigue and are associated with obesity, breast, and colon cancer. She agrees to continue to take prescription Vitamin D @50 ,000 IU every week and will follow-up for routine testing of Vitamin D, at least 2-3 times per year to avoid over-replacement.  4. Chronic pain of both knees Will follow because mobility and pain control are important for weight management.  5. At risk for deficient intake of food Cynthia [name] was given approximately 15 minutes of deficit intake of food prevention counseling today. Cynthia [name] is at risk for eating too few calories based on current food recall. She was encouraged to focus on meeting caloric and protein goals according to her recommended meal plan.  6. Class 3 severe obesity with serious comorbidity and body mass index (BMI) of 45.0 to 49.9 in adult, unspecified obesity type [facility]) Cynthia [name] is currently in the action stage of change. As such, her goal is to continue with weight loss efforts. She has agreed to the Category 2 Plan.   Exercise goals: As is.  Behavioral modification strategies: increasing lean protein intake and increasing water intake.  Elzina has  agreed to follow-up with our clinic in 2 weeks. She was informed of the importance of frequent follow-up visits to maximize her success with intensive lifestyle modifications for her multiple health conditions.   Objective:   Blood pressure 136/81, pulse (!) 137, temperature 98.7 F (37.1 C), temperature source Oral, height 5\' 2"  (1.575 m), weight 270 lb (122.5 kg), SpO2 96 %. Body mass index is 49.38 kg/m.  General: Cooperative, alert, well developed, in no acute distress. HEENT: Conjunctivae and lids unremarkable. Cardiovascular: Regular rhythm.  Lungs: Normal work of breathing. Neurologic: No focal deficits.   Lab Results  Component Value Date   CREATININE 0.76 06/29/2019   BUN 10 06/29/2019   NA 140 06/29/2019   K 4.3 06/29/2019   CL 106 06/29/2019   CO2 18 (L) 06/29/2019   Lab Results  Component Value Date   ALT 23 06/29/2019   AST 7 06/29/2019   ALKPHOS 99 06/29/2019   BILITOT 0.3 06/29/2019   Lab Results  Component Value Date   HGBA1C 5.4 06/29/2019   HGBA1C 5.4 10/30/2018   HGBA1C 5.5 01/16/2018   HGBA1C 5.4 10/03/2017   HGBA1C 5.3 05/01/2017   Lab Results  Component Value Date   INSULIN 17.7 06/29/2019   INSULIN 29.3 (H) 10/30/2018   INSULIN 12.9 01/16/2018   INSULIN 21.7 10/03/2017   INSULIN 11.1 05/01/2017   Lab Results  Component Value Date   TSH 1.640 05/01/2017   Lab Results  Component Value Date   CHOL 172 06/29/2019   HDL 49 06/29/2019   LDLCALC 110 (H) 06/29/2019   TRIG 65 06/29/2019   CHOLHDL 3.4 01/09/2012   Lab Results  Component Value Date   WBC 8.7 06/29/2019   HGB 14.3 06/29/2019   HCT 45.0 06/29/2019   MCV 91 06/29/2019   PLT 285 06/29/2019   Attestation Statements:   Reviewed by clinician on day of visit: allergies, medications, problem list, medical history, surgical history, family history, social history, and previous encounter notes.  I, 14/01/2019, CMA, am acting as Insurance claims handler for Energy manager, DO.  I have  reviewed the above documentation for accuracy and completeness, and I agree with the above. [employer], DO

## 2019-09-29 ENCOUNTER — Other Ambulatory Visit (INDEPENDENT_AMBULATORY_CARE_PROVIDER_SITE_OTHER): Payer: Self-pay | Admitting: *Deleted

## 2019-09-29 ENCOUNTER — Encounter (INDEPENDENT_AMBULATORY_CARE_PROVIDER_SITE_OTHER): Payer: Self-pay | Admitting: Family Medicine

## 2019-09-29 DIAGNOSIS — F419 Anxiety disorder, unspecified: Secondary | ICD-10-CM

## 2019-09-29 MED ORDER — FLUOXETINE HCL 10 MG PO CAPS: 10.0000 mg | ORAL_CAPSULE | ORAL | 0 refills | Status: DC

## 2019-10-09 ENCOUNTER — Ambulatory Visit: Payer: BC Managed Care – PPO | Attending: Internal Medicine

## 2019-10-09 DIAGNOSIS — Z23 Encounter for immunization: Secondary | ICD-10-CM

## 2019-10-09 NOTE — Progress Notes (Signed)
   Covid-19 Vaccination Clinic  Name:  Cynthia [name]    MRN: [id number] DOB: Nov 23, 1985  10/09/2019  [name] was observed post Covid-19 immunization for 15 minutes without incident. She was provided with Vaccine Information Sheet and instruction to access the V-Safe system.   [name] was instructed to call 911 with any severe reactions post vaccine: Marland Kitchen Difficulty breathing  . Swelling of face and throat  . A fast heartbeat  . A bad rash all over body  . Dizziness and weakness   Immunizations Administered    Name Date Dose VIS Date Route   Pfizer COVID-19 Vaccine 10/09/2019  2:26 PM 0.3 mL 07/03/2019 Intramuscular   Manufacturer: [employer], [employer]   Lot: [id number]   NDC: [address]-2

## 2019-10-14 ENCOUNTER — Ambulatory Visit (INDEPENDENT_AMBULATORY_CARE_PROVIDER_SITE_OTHER): Payer: BC Managed Care – PPO | Admitting: Family Medicine

## 2019-10-14 ENCOUNTER — Other Ambulatory Visit: Payer: Self-pay

## 2019-10-14 ENCOUNTER — Encounter (INDEPENDENT_AMBULATORY_CARE_PROVIDER_SITE_OTHER): Payer: Self-pay | Admitting: Family Medicine

## 2019-10-14 VITALS — BP 136/84 | HR 112 | Temp 98.4°F | Ht 62.0 in | Wt 268.0 lb

## 2019-10-14 DIAGNOSIS — Z6841 Body Mass Index (BMI) 40.0 and over, adult: Secondary | ICD-10-CM

## 2019-10-14 DIAGNOSIS — M25562 Pain in left knee: Secondary | ICD-10-CM

## 2019-10-14 DIAGNOSIS — E559 Vitamin D deficiency, unspecified: Secondary | ICD-10-CM | POA: Diagnosis not present

## 2019-10-14 DIAGNOSIS — M25561 Pain in right knee: Secondary | ICD-10-CM | POA: Diagnosis not present

## 2019-10-14 DIAGNOSIS — Z9189 Other specified personal risk factors, not elsewhere classified: Secondary | ICD-10-CM | POA: Diagnosis not present

## 2019-10-14 DIAGNOSIS — R7303 Prediabetes: Secondary | ICD-10-CM | POA: Diagnosis not present

## 2019-10-14 DIAGNOSIS — G8929 Other chronic pain: Secondary | ICD-10-CM

## 2019-10-14 DIAGNOSIS — F419 Anxiety disorder, unspecified: Secondary | ICD-10-CM

## 2019-10-14 DIAGNOSIS — F988 Other specified behavioral and emotional disorders with onset usually occurring in childhood and adolescence: Secondary | ICD-10-CM

## 2019-10-14 MED ORDER — FLUOXETINE HCL 10 MG PO CAPS: 10.0000 mg | ORAL_CAPSULE | ORAL | 0 refills | Status: DC

## 2019-10-14 MED ORDER — LISDEXAMFETAMINE DIMESYLATE 20 MG PO CAPS: 20.0000 mg | ORAL_CAPSULE | Freq: Every day | ORAL | 0 refills | Status: DC

## 2019-10-14 MED ORDER — VITAMIN D (ERGOCALCIFEROL) 1.25 MG (50000 UNIT) PO CAPS: 50000.0000 [IU] | ORAL_CAPSULE | ORAL | 0 refills | Status: DC

## 2019-10-14 NOTE — Progress Notes (Signed)
Chief Complaint:   OBESITY Cynthia [name] is here to discuss her progress with her obesity treatment plan along with follow-up of her obesity related diagnoses. Cynthia [name] is on the Category 2 Plan and states she is following her eating plan approximately 90% of the time. Cynthia [name] states she is exercising for 0 minutes 0 times per week.  Today's visit was #: 42 Starting weight: 264 lbs Starting date: 05/01/2017 Today's weight: 268 lbs Today's date: 10/14/2019 Total lbs lost to date: 0 Total lbs lost since last in-office visit: 2 lbs  Interim History: Cynthia [name] has lost 2 inches in her bust and 1.75 inches in her waist.  Based on food recall, she is not eating enough calories or protein.  Subjective:   1. Attention deficit disorder, unspecified hyperactivity presence Cynthia [name] has a diagnosis of ADHD and is taking Vyvanse 20 mg daily.  2. Prediabetes Cynthia [name] has a diagnosis of prediabetes based on her elevated HgA1c and was informed this puts her at greater risk of developing diabetes. She continues to work on diet and exercise to decrease her risk of diabetes. She denies nausea or hypoglycemia.  Lab Results  Component Value Date   HGBA1C 5.4 06/29/2019   Lab Results  Component Value Date   INSULIN 17.7 06/29/2019   INSULIN 29.3 (H) 10/30/2018   INSULIN 12.9 01/16/2018   INSULIN 21.7 10/03/2017   INSULIN 11.1 05/01/2017   3. Vitamin D deficiency Cynthia [name]'s Vitamin D level was 33.8 on 06/29/2019. She is currently taking vit D. She denies nausea, vomiting or muscle weakness.  4. Chronic pain of both knees Cynthia [name] has chronic pain in both of her knees.  5. Anxiety Cynthia [name] has anxiety and takes Prozac to help with her symptoms.  Assessment/Plan:   1. Attention deficit disorder, unspecified hyperactivity presence Current treatment plan is effective, no change in therapy. Orders and follow up as documented in patient record.   Orders - lisdexamfetamine (VYVANSE) 20 MG capsule; Take 1 capsule (20 mg  total) by mouth daily.  Dispense: 30 capsule; Refill: 0  2. Prediabetes Cynthia [name] will continue to work on weight loss, exercise, and decreasing simple carbohydrates to help decrease the risk of diabetes.   3. Vitamin D deficiency Low Vitamin D level contributes to fatigue and are associated with obesity, breast, and colon cancer. She agrees to continue to take prescription Vitamin D @50 ,000 IU every week and will follow-up for routine testing of Vitamin D, at least 2-3 times per year to avoid over-replacement. - Vitamin D, Ergocalciferol, (DRISDOL) 1.25 MG (50000 UNIT) CAPS capsule; Take 1 capsule (50,000 Units total) by mouth every 7 (seven) days.  Dispense: 4 capsule; Refill: 0  4. Chronic pain of both knees Will follow because mobility and pain control are important for weight management.  5. Anxiety Behavior modification techniques were discussed today to help Cynthia [name] deal with her anxiety.  Orders and follow up as documented in patient record.   Orders - FLUoxetine (PROZAC) 10 MG capsule; Take 1 capsule (10 mg total) by mouth as directed. Take three tabs by mouth daily  Dispense: 90 capsule; Refill: 0  6. At risk for deficient intake of food Cynthia [name] was given approximately 15 minutes of deficit intake of food prevention counseling today. Cynthia [name] is at risk for eating too few calories based on current food recall. She was encouraged to focus on meeting caloric and protein goals according to her recommended meal plan.   7. Class 3 severe obesity with serious comorbidity and body mass index (  BMI) of 45.0 to 49.9 in adult, unspecified obesity type [facility]) Cynthia [name] is currently in the action stage of change. As such, her goal is to continue with weight loss efforts. She has agreed to the Category 2 Plan.   Exercise goals: No exercise has been prescribed at this time.  Behavioral modification strategies: increasing lean protein intake and increasing water intake.  Cynthia [name] has agreed to follow-up with our  clinic in 2 weeks. She was informed of the importance of frequent follow-up visits to maximize her success with intensive lifestyle modifications for her multiple health conditions.   Objective:   Blood pressure 136/84, pulse (!) 112, temperature 98.4 F (36.9 C), temperature source Oral, height 5\' 2"  (1.575 m), weight 268 lb (121.6 kg), last menstrual period 09/16/2019, SpO2 98 %. Body mass index is 49.02 kg/m.  General: Cooperative, alert, well developed, in no acute distress. HEENT: Conjunctivae and lids unremarkable. Cardiovascular: Regular rhythm.  Lungs: Normal work of breathing. Neurologic: No focal deficits.   Lab Results  Component Value Date   CREATININE 0.76 06/29/2019   BUN 10 06/29/2019   NA 140 06/29/2019   K 4.3 06/29/2019   CL 106 06/29/2019   CO2 18 (L) 06/29/2019   Lab Results  Component Value Date   ALT 23 06/29/2019   AST 7 06/29/2019   ALKPHOS 99 06/29/2019   BILITOT 0.3 06/29/2019   Lab Results  Component Value Date   HGBA1C 5.4 06/29/2019   HGBA1C 5.4 10/30/2018   HGBA1C 5.5 01/16/2018   HGBA1C 5.4 10/03/2017   HGBA1C 5.3 05/01/2017   Lab Results  Component Value Date   INSULIN 17.7 06/29/2019   INSULIN 29.3 (H) 10/30/2018   INSULIN 12.9 01/16/2018   INSULIN 21.7 10/03/2017   INSULIN 11.1 05/01/2017   Lab Results  Component Value Date   TSH 1.640 05/01/2017   Lab Results  Component Value Date   CHOL 172 06/29/2019   HDL 49 06/29/2019   LDLCALC 110 (H) 06/29/2019   TRIG 65 06/29/2019   CHOLHDL 3.4 01/09/2012   Lab Results  Component Value Date   WBC 8.7 06/29/2019   HGB 14.3 06/29/2019   HCT 45.0 06/29/2019   MCV 91 06/29/2019   PLT 285 06/29/2019   Attestation Statements:   Reviewed by clinician on day of visit: allergies, medications, problem list, medical history, surgical history, family history, social history, and previous encounter notes.  I, Water quality scientist, CMA, am acting as Location manager for [employer], DO.  I  have reviewed the above documentation for accuracy and completeness, and I agree with the above. Briscoe Deutscher, DO

## 2019-10-28 ENCOUNTER — Other Ambulatory Visit: Payer: Self-pay

## 2019-10-28 ENCOUNTER — Ambulatory Visit (INDEPENDENT_AMBULATORY_CARE_PROVIDER_SITE_OTHER): Payer: BC Managed Care – PPO | Admitting: Family Medicine

## 2019-10-28 ENCOUNTER — Encounter (INDEPENDENT_AMBULATORY_CARE_PROVIDER_SITE_OTHER): Payer: Self-pay | Admitting: Family Medicine

## 2019-10-28 VITALS — HR 115 | Temp 99.0°F | Ht 62.0 in | Wt 268.0 lb

## 2019-10-28 DIAGNOSIS — Z6841 Body Mass Index (BMI) 40.0 and over, adult: Secondary | ICD-10-CM

## 2019-10-28 DIAGNOSIS — M25562 Pain in left knee: Secondary | ICD-10-CM

## 2019-10-28 DIAGNOSIS — F988 Other specified behavioral and emotional disorders with onset usually occurring in childhood and adolescence: Secondary | ICD-10-CM | POA: Diagnosis not present

## 2019-10-28 DIAGNOSIS — R7303 Prediabetes: Secondary | ICD-10-CM | POA: Diagnosis not present

## 2019-10-28 DIAGNOSIS — F419 Anxiety disorder, unspecified: Secondary | ICD-10-CM | POA: Diagnosis not present

## 2019-10-28 DIAGNOSIS — Z9189 Other specified personal risk factors, not elsewhere classified: Secondary | ICD-10-CM

## 2019-10-28 DIAGNOSIS — M25561 Pain in right knee: Secondary | ICD-10-CM

## 2019-10-28 DIAGNOSIS — G8929 Other chronic pain: Secondary | ICD-10-CM

## 2019-10-28 MED ORDER — FLUOXETINE HCL 10 MG PO CAPS: 10.0000 mg | ORAL_CAPSULE | ORAL | 0 refills | Status: DC

## 2019-10-28 MED ORDER — SAXENDA 18 MG/3ML ~~LOC~~ SOPN: 3.0000 mg | PEN_INJECTOR | Freq: Every day | SUBCUTANEOUS | 0 refills | Status: DC

## 2019-10-28 MED ORDER — LISDEXAMFETAMINE DIMESYLATE 20 MG PO CAPS: 20.0000 mg | ORAL_CAPSULE | Freq: Every day | ORAL | 0 refills | Status: DC

## 2019-10-28 NOTE — Progress Notes (Signed)
Chief Complaint:   OBESITY Cynthia [name] is here to discuss her progress with her obesity treatment plan along with follow-up of her obesity related diagnoses. Cynthia [name] is on the Category 2 Plan and states she is following her eating plan approximately 75% of the time. Cynthia [name] states she is exercising for 0 minutes 0 times per week.  Today's visit was #: 41 Starting weight: 264 lbs Starting date: 05/01/2017 Today's weight: 268 lbs Today's date: 10/28/2019 Total lbs lost to date: 0 Total lbs lost since last in-office visit: 0  Interim History: Cynthia [name] is scheduled for epidural injections for her neck and back.  She is also getting treatment at [facility].  She will be going out of town in 2 weeks.  She reports that she is getting around 100 grams of protein daily!   Subjective:   1. Prediabetes Cynthia [name] has a diagnosis of prediabetes based on her elevated HgA1c and was informed this puts her at greater risk of developing diabetes. She continues to work on diet and exercise to decrease her risk of diabetes. She denies nausea or hypoglycemia.  Lab Results  Component Value Date   HGBA1C 5.4 06/29/2019   Lab Results  Component Value Date   INSULIN 17.7 06/29/2019   INSULIN 29.3 (H) 10/30/2018   INSULIN 12.9 01/16/2018   INSULIN 21.7 10/03/2017   INSULIN 11.1 05/01/2017   2. Chronic pain of both knees Cynthia [name] continues to have pain in both knees and reports that the pain has worsened since the PRP injections. She was expecting it to "get worse before it gets better." She restarted her antiinflammatories this week.   3. Attention deficit disorder Cynthia [name] is taking Vyvanse 20 mg daily for ADHD. Cardiovascular ROS: negative for - palpitations, rapid heart rate or shortness of breath.  4. Anxiety Cynthia [name] has anxiety, and takes Prozac to help with her symptoms.  5. At risk for deficient intake of food The patient is at a higher than average risk of deficient intake of food.  Assessment/Plan:    1. Prediabetes Cynthia [name] will continue to work on weight loss, exercise, and decreasing simple carbohydrates to help decrease the risk of diabetes.   Orders - Liraglutide -Weight Management (SAXENDA) 18 MG/3ML SOPN; Inject 0.5 mLs (3 mg total) into the skin daily.  Dispense: 5 pen; Refill: 0  2. Chronic pain of both knees Will follow because mobility and pain control are important for weight management.  3. Attention deficit disorder, unspecified hyperactivity presence Will refill Vyvanse today. - lisdexamfetamine (VYVANSE) 20 MG capsule; Take 1 capsule (20 mg total) by mouth daily.  Dispense: 30 capsule; Refill: 0  4. Anxiety Behavior modification techniques were discussed today to help Cynthia [name] deal with her anxiety.  Orders and follow up as documented in patient record.   Orders - FLUoxetine (PROZAC) 10 MG capsule; Take 1 capsule (10 mg total) by mouth as directed. Take three tabs by mouth daily  Dispense: 90 capsule; Refill: 0  5. At risk for deficient intake of food Cynthia [name] was given approximately 15 minutes of deficit intake of food prevention counseling today. Cynthia [name] is at risk for eating too few calories based on current food recall. She was encouraged to focus on meeting caloric and protein goals according to her recommended meal plan.   6. Class 3 severe obesity with serious comorbidity and body mass index (BMI) of 45.0 to 49.9 in adult, unspecified obesity type [facility] Cynthia [name] is currently in the action stage of change. As such, her goal  is to continue with weight loss efforts. She has agreed to the Category 2 Plan.   Exercise goals: No exercise has been prescribed at this time.  Behavioral modification strategies: increasing lean protein intake and increasing water intake.  Cynthia [name] has agreed to follow-up with our clinic in 2 weeks. She was informed of the importance of frequent follow-up visits to maximize her success with intensive lifestyle modifications for her multiple health  conditions.   Objective:   Pulse (!) 115, temperature 99 F (37.2 C), temperature source Oral, height 5\' 2"  (1.575 m), weight 268 lb (121.6 kg), last menstrual period 10/14/2019, SpO2 97 %. Body mass index is 49.02 kg/m.  General: Cooperative, alert, well developed, in no acute distress. HEENT: Conjunctivae and lids unremarkable. Cardiovascular: Regular rhythm.  Lungs: Normal work of breathing. Neurologic: No focal deficits.   Lab Results  Component Value Date   CREATININE 0.76 06/29/2019   BUN 10 06/29/2019   NA 140 06/29/2019   K 4.3 06/29/2019   CL 106 06/29/2019   CO2 18 (L) 06/29/2019   Lab Results  Component Value Date   ALT 23 06/29/2019   AST 7 06/29/2019   ALKPHOS 99 06/29/2019   BILITOT 0.3 06/29/2019   Lab Results  Component Value Date   HGBA1C 5.4 06/29/2019   HGBA1C 5.4 10/30/2018   HGBA1C 5.5 01/16/2018   HGBA1C 5.4 10/03/2017   HGBA1C 5.3 05/01/2017   Lab Results  Component Value Date   INSULIN 17.7 06/29/2019   INSULIN 29.3 (H) 10/30/2018   INSULIN 12.9 01/16/2018   INSULIN 21.7 10/03/2017   INSULIN 11.1 05/01/2017   Lab Results  Component Value Date   TSH 1.640 05/01/2017   Lab Results  Component Value Date   CHOL 172 06/29/2019   HDL 49 06/29/2019   LDLCALC 110 (H) 06/29/2019   TRIG 65 06/29/2019   CHOLHDL 3.4 01/09/2012   Lab Results  Component Value Date   WBC 8.7 06/29/2019   HGB 14.3 06/29/2019   HCT 45.0 06/29/2019   MCV 91 06/29/2019   PLT 285 06/29/2019   Attestation Statements:   Reviewed by clinician on day of visit: allergies, medications, problem list, medical history, surgical history, family history, social history, and previous encounter notes.  I, 14/01/2019, CMA, am acting as Insurance claims handler for Energy manager, DO.  I have reviewed the above documentation for accuracy and completeness, and I agree with the above. [employer], DO

## 2019-11-02 DIAGNOSIS — M25561 Pain in right knee: Secondary | ICD-10-CM | POA: Diagnosis not present

## 2019-11-02 DIAGNOSIS — M545 Low back pain: Secondary | ICD-10-CM | POA: Diagnosis not present

## 2019-11-02 DIAGNOSIS — M25562 Pain in left knee: Secondary | ICD-10-CM | POA: Diagnosis not present

## 2019-11-03 ENCOUNTER — Ambulatory Visit: Payer: BC Managed Care – PPO | Attending: Internal Medicine

## 2019-11-03 DIAGNOSIS — Z23 Encounter for immunization: Secondary | ICD-10-CM

## 2019-11-03 NOTE — Progress Notes (Signed)
   Covid-19 Vaccination Clinic  Name:  Cynthia [name]    MRN: [id number] DOB: May 08, 1986  11/03/2019  [name] was observed post Covid-19 immunization for 15 minutes without incident. She was provided with Vaccine Information Sheet and instruction to access the V-Safe system.   [name] was instructed to call 911 with any severe reactions post vaccine: Marland Kitchen Difficulty breathing  . Swelling of face and throat  . A fast heartbeat  . A bad rash all over body  . Dizziness and weakness   Immunizations Administered    Name Date Dose VIS Date Route   Pfizer COVID-19 Vaccine 11/03/2019  3:27 PM 0.3 mL 07/03/2019 Intramuscular   Manufacturer: [employer], [employer]   Lot: [id number]   NDC: [address]-9

## 2019-11-10 ENCOUNTER — Ambulatory Visit: Payer: BC Managed Care – PPO | Admitting: Women's Health

## 2019-11-12 ENCOUNTER — Encounter (INDEPENDENT_AMBULATORY_CARE_PROVIDER_SITE_OTHER): Payer: Self-pay | Admitting: Family Medicine

## 2019-11-12 ENCOUNTER — Ambulatory Visit (INDEPENDENT_AMBULATORY_CARE_PROVIDER_SITE_OTHER): Payer: BC Managed Care – PPO | Admitting: Family Medicine

## 2019-11-12 ENCOUNTER — Other Ambulatory Visit: Payer: Self-pay

## 2019-11-12 VITALS — BP 122/94 | HR 123 | Temp 97.8°F | Ht 62.0 in | Wt 264.0 lb

## 2019-11-12 DIAGNOSIS — M25562 Pain in left knee: Secondary | ICD-10-CM | POA: Diagnosis not present

## 2019-11-12 DIAGNOSIS — F419 Anxiety disorder, unspecified: Secondary | ICD-10-CM | POA: Diagnosis not present

## 2019-11-12 DIAGNOSIS — E559 Vitamin D deficiency, unspecified: Secondary | ICD-10-CM | POA: Diagnosis not present

## 2019-11-12 DIAGNOSIS — M25561 Pain in right knee: Secondary | ICD-10-CM | POA: Diagnosis not present

## 2019-11-12 DIAGNOSIS — Z9189 Other specified personal risk factors, not elsewhere classified: Secondary | ICD-10-CM

## 2019-11-12 DIAGNOSIS — M545 Low back pain: Secondary | ICD-10-CM | POA: Diagnosis not present

## 2019-11-12 DIAGNOSIS — Z6841 Body Mass Index (BMI) 40.0 and over, adult: Secondary | ICD-10-CM

## 2019-11-12 MED ORDER — SAXENDA 18 MG/3ML ~~LOC~~ SOPN: 3.0000 mg | PEN_INJECTOR | Freq: Every day | SUBCUTANEOUS | 0 refills | Status: DC

## 2019-11-12 MED ORDER — FLUOXETINE HCL 10 MG PO CAPS: 10.0000 mg | ORAL_CAPSULE | ORAL | 0 refills | Status: DC

## 2019-11-12 MED ORDER — VITAMIN D (ERGOCALCIFEROL) 1.25 MG (50000 UNIT) PO CAPS: 50000.0000 [IU] | ORAL_CAPSULE | ORAL | 0 refills | Status: DC

## 2019-11-16 DIAGNOSIS — M545 Low back pain: Secondary | ICD-10-CM | POA: Diagnosis not present

## 2019-11-16 DIAGNOSIS — M25562 Pain in left knee: Secondary | ICD-10-CM | POA: Diagnosis not present

## 2019-11-16 DIAGNOSIS — M25561 Pain in right knee: Secondary | ICD-10-CM | POA: Diagnosis not present

## 2019-11-16 NOTE — Progress Notes (Signed)
Chief Complaint:   OBESITY Cynthia [name] is here to discuss her progress with her obesity treatment plan along with follow-up of her obesity related diagnoses. Cynthia [name] is on the Category 2 Plan and states she is following her eating plan approximately 75% of the time. Cynthia [name] states she is going to aquatic therapy for 60 minutes 1 time per week.  Today's visit was #: 59 Starting weight: 264 lbs Starting date: 05/01/2017 Today's weight: 264 lbs Today's date: 11/12/2019 Total lbs lost to date: 0 Total lbs lost since last in-office visit: 4 lbs  Interim History: Keyri has been back on the meal plan the majority of the time.  She reports that she is learning how to eat on Saxenda and Vyvanse by braking up meals.  She is going to [employer] for a week.  Subjective:   1. Vitamin D deficiency Cynthia [name]'s Vitamin D level was 33.8 on 06/29/2019. She is currently taking prescription vitamin D 50,000 IU each week. She denies nausea, vomiting or muscle weakness.  She endorses fatigue.  2. Anxiety Cynthia [name] reports that her anxiety symptoms are waxing and waning.  3. At risk for osteoporosis Cynthia [name] is at higher risk of osteopenia and osteoporosis due to Vitamin D deficiency.   Assessment/Plan:   1. Vitamin D deficiency Low Vitamin D level contributes to fatigue and are associated with obesity, breast, and colon cancer. She agrees to continue to take prescription Vitamin D @50 ,000 IU every week and will follow-up for routine testing of Vitamin D, at least 2-3 times per year to avoid over-replacement. - Vitamin D, Ergocalciferol, (DRISDOL) 1.25 MG (50000 UNIT) CAPS capsule; Take 1 capsule (50,000 Units total) by mouth every 7 (seven) days.  Dispense: 4 capsule; Refill: 0  2. Anxiety Behavior modification techniques were discussed today to help Kerith deal with her anxiety.  Orders and follow up as documented in patient record.  - FLUoxetine (PROZAC) 10 MG capsule; Take 1 capsule (10 mg total) by mouth as directed.  Take three tabs by mouth daily  Dispense: 90 capsule; Refill: 0  3. At risk for osteoporosis Amylah was given approximately 15 minutes of osteoporosis prevention counseling today. Amarilis is at risk for osteopenia and osteoporosis due to her Vitamin D deficiency. She was encouraged to take her Vitamin D and follow her higher calcium diet and increase strengthening exercise to help strengthen her bones and decrease her risk of osteopenia and osteoporosis.  Repetitive spaced learning was employed today to elicit superior memory formation and behavioral change.  4. Class 3 severe obesity with serious comorbidity and body mass index (BMI) of 45.0 to 49.9 in adult, unspecified obesity type (HCC) - Liraglutide -Weight Management (SAXENDA) 18 MG/3ML SOPN; Inject 0.5 mLs (3 mg total) into the skin daily.  Dispense: 5 pen; Refill: 0  Cynthia [name] is currently in the action stage of change. As such, her goal is to continue with weight loss efforts. She has agreed to the Category 2 Plan or keeping a food journal and adhering to recommended goals of 1150-1300 calories and 80+ grams of protein daily.   Exercise goals: For substantial health benefits, adults should do at least 150 minutes (2 hours and 30 minutes) a week of moderate-intensity, or 75 minutes (1 hour and 15 minutes) a week of vigorous-intensity aerobic physical activity, or an equivalent combination of moderate- and vigorous-intensity aerobic activity. Aerobic activity should be performed in episodes of at least 10 minutes, and preferably, it should be spread throughout the week.  Behavioral modification  strategies: increasing lean protein intake, meal planning and cooking strategies and keeping healthy foods in the home.  Cynthia [name] has agreed to follow-up with our clinic in 3 weeks. She was informed of the importance of frequent follow-up visits to maximize her success with intensive lifestyle modifications for her multiple health conditions.   Objective:    Blood pressure (!) 122/94, pulse (!) 123, temperature 97.8 F (36.6 C), temperature source Oral, height 5\' 2"  (1.575 m), weight 264 lb (119.7 kg), last menstrual period 10/14/2019, SpO2 97 %. Body mass index is 48.29 kg/m.  General: Cooperative, alert, well developed, in no acute distress. HEENT: Conjunctivae and lids unremarkable. Cardiovascular: Regular rhythm.  Lungs: Normal work of breathing. Neurologic: No focal deficits.   Lab Results  Component Value Date   CREATININE 0.76 06/29/2019   BUN 10 06/29/2019   NA 140 06/29/2019   K 4.3 06/29/2019   CL 106 06/29/2019   CO2 18 (L) 06/29/2019   Lab Results  Component Value Date   ALT 23 06/29/2019   AST 7 06/29/2019   ALKPHOS 99 06/29/2019   BILITOT 0.3 06/29/2019   Lab Results  Component Value Date   HGBA1C 5.4 06/29/2019   HGBA1C 5.4 10/30/2018   HGBA1C 5.5 01/16/2018   HGBA1C 5.4 10/03/2017   HGBA1C 5.3 05/01/2017   Lab Results  Component Value Date   INSULIN 17.7 06/29/2019   INSULIN 29.3 (H) 10/30/2018   INSULIN 12.9 01/16/2018   INSULIN 21.7 10/03/2017   INSULIN 11.1 05/01/2017   Lab Results  Component Value Date   TSH 1.640 05/01/2017   Lab Results  Component Value Date   CHOL 172 06/29/2019   HDL 49 06/29/2019   LDLCALC 110 (H) 06/29/2019   TRIG 65 06/29/2019   CHOLHDL 3.4 01/09/2012   Lab Results  Component Value Date   WBC 8.7 06/29/2019   HGB 14.3 06/29/2019   HCT 45.0 06/29/2019   MCV 91 06/29/2019   PLT 285 06/29/2019   Attestation Statements:   Reviewed by clinician on day of visit: allergies, medications, problem list, medical history, surgical history, family history, social history, and previous encounter notes.  I, 14/01/2019, CMA, am acting as transcriptionist for Insurance claims handler, MD.  I have reviewed the above documentation for accuracy and completeness, and I agree with the above. - Reuben Likes, MD

## 2019-11-17 DIAGNOSIS — M170 Bilateral primary osteoarthritis of knee: Secondary | ICD-10-CM | POA: Diagnosis not present

## 2019-11-19 DIAGNOSIS — M545 Low back pain: Secondary | ICD-10-CM | POA: Diagnosis not present

## 2019-11-19 DIAGNOSIS — M25561 Pain in right knee: Secondary | ICD-10-CM | POA: Diagnosis not present

## 2019-11-19 DIAGNOSIS — M25562 Pain in left knee: Secondary | ICD-10-CM | POA: Diagnosis not present

## 2019-11-25 ENCOUNTER — Other Ambulatory Visit: Payer: Self-pay | Admitting: Allergy and Immunology

## 2019-12-01 DIAGNOSIS — M545 Low back pain: Secondary | ICD-10-CM | POA: Diagnosis not present

## 2019-12-01 DIAGNOSIS — M25561 Pain in right knee: Secondary | ICD-10-CM | POA: Diagnosis not present

## 2019-12-01 DIAGNOSIS — M25562 Pain in left knee: Secondary | ICD-10-CM | POA: Diagnosis not present

## 2019-12-03 DIAGNOSIS — H40013 Open angle with borderline findings, low risk, bilateral: Secondary | ICD-10-CM | POA: Diagnosis not present

## 2019-12-07 ENCOUNTER — Ambulatory Visit (INDEPENDENT_AMBULATORY_CARE_PROVIDER_SITE_OTHER): Payer: BC Managed Care – PPO | Admitting: Family Medicine

## 2019-12-15 ENCOUNTER — Other Ambulatory Visit: Payer: Self-pay

## 2019-12-15 ENCOUNTER — Ambulatory Visit (INDEPENDENT_AMBULATORY_CARE_PROVIDER_SITE_OTHER): Payer: BC Managed Care – PPO | Admitting: Family Medicine

## 2019-12-15 ENCOUNTER — Encounter (INDEPENDENT_AMBULATORY_CARE_PROVIDER_SITE_OTHER): Payer: Self-pay | Admitting: Family Medicine

## 2019-12-15 VITALS — BP 94/51 | HR 106 | Temp 98.4°F | Ht 62.0 in | Wt 262.0 lb

## 2019-12-15 DIAGNOSIS — F988 Other specified behavioral and emotional disorders with onset usually occurring in childhood and adolescence: Secondary | ICD-10-CM | POA: Diagnosis not present

## 2019-12-15 DIAGNOSIS — F419 Anxiety disorder, unspecified: Secondary | ICD-10-CM

## 2019-12-15 DIAGNOSIS — E559 Vitamin D deficiency, unspecified: Secondary | ICD-10-CM

## 2019-12-15 DIAGNOSIS — R7303 Prediabetes: Secondary | ICD-10-CM

## 2019-12-15 DIAGNOSIS — Z6841 Body Mass Index (BMI) 40.0 and over, adult: Secondary | ICD-10-CM

## 2019-12-15 DIAGNOSIS — Z9189 Other specified personal risk factors, not elsewhere classified: Secondary | ICD-10-CM | POA: Diagnosis not present

## 2019-12-15 MED ORDER — LISDEXAMFETAMINE DIMESYLATE 20 MG PO CAPS: 20.0000 mg | ORAL_CAPSULE | Freq: Every day | ORAL | 0 refills | Status: DC

## 2019-12-15 MED ORDER — FLUOXETINE HCL 10 MG PO CAPS: 30.0000 mg | ORAL_CAPSULE | Freq: Every day | ORAL | 0 refills | Status: DC

## 2019-12-15 MED ORDER — SAXENDA 18 MG/3ML ~~LOC~~ SOPN: 3.0000 mg | PEN_INJECTOR | Freq: Every day | SUBCUTANEOUS | 0 refills | Status: DC

## 2019-12-15 MED ORDER — FLUOXETINE HCL 20 MG PO CAPS: 20.0000 mg | ORAL_CAPSULE | ORAL | 0 refills | Status: DC

## 2019-12-15 MED ORDER — VITAMIN D (ERGOCALCIFEROL) 1.25 MG (50000 UNIT) PO CAPS: 50000.0000 [IU] | ORAL_CAPSULE | ORAL | 0 refills | Status: DC

## 2019-12-15 NOTE — Progress Notes (Signed)
Chief Complaint:   OBESITY Cynthia [name] is here to discuss her progress with her obesity treatment plan along with follow-up of her obesity related diagnoses. Cynthia [name] is on the Category 2 Plan or keeping a food journal and adhering to recommended goals of 1150-1300 calories and 80+ grams of protein and states she is following her eating plan approximately 60-70% of the time. Cynthia [name] states she is exercising for 0 minutes 0 times per week.  Today's visit was #: 29 Starting weight: 264 lbs Starting date: 05/01/2017 Today's weight: 262 lbs Today's date: 12/15/2019 Total lbs lost to date: 2 lbs Total lbs lost since last in-office visit: 2 lbs  Interim History: Cynthia [name] recently went on vacation.  She says she ate well and drank more than usual.  She has some increased anxiety - her mom had surgery and her aunt is helping but is annoying.  Her bilateral knee pain has worsened with walking in [place].  PT is on hold for now.  Subjective:   1. Vitamin D deficiency Cynthia [name] Vitamin D level was 33.8 on 06/29/2019. She is currently taking prescription vitamin D 50,000 IU each week. She denies nausea, vomiting or muscle weakness.  2. Prediabetes Cynthia [name] has a diagnosis of prediabetes based on her elevated HgA1c and was informed this puts her at greater risk of developing diabetes. She continues to work on diet and exercise to decrease her risk of diabetes. She denies nausea or hypoglycemia.  Lab Results  Component Value Date   HGBA1C 5.4 06/29/2019   Lab Results  Component Value Date   INSULIN 17.7 06/29/2019   INSULIN 29.3 (H) 10/30/2018   INSULIN 12.9 01/16/2018   INSULIN 21.7 10/03/2017   INSULIN 11.1 05/01/2017   3. Attention deficit disorder, unspecified hyperactivity presence Cynthia [name] is taking Vyvanse 20 mg daily for ADHD.  4. Anxiety Cynthia [name] takes Prozac 20 mg daily to help with her symptoms of anxiety.  5. At risk for deficient intake of food The patient is at a higher than average risk of  deficient intake of food.  Assessment/Plan:   1. Vitamin D deficiency Low Vitamin D level contributes to fatigue and are associated with obesity, breast, and colon cancer. She agrees to continue to take prescription Vitamin D @50 ,000 IU every week and will follow-up for routine testing of Vitamin D, at least 2-3 times per year to avoid over-replacement.  Orders - Vitamin D, Ergocalciferol, (DRISDOL) 1.25 MG (50000 UNIT) CAPS capsule; Take 1 capsule (50,000 Units total) by mouth every 7 (seven) days.  Dispense: 4 capsule; Refill: 0  2. Prediabetes Low Vitamin D level contributes to fatigue and are associated with obesity, breast, and colon cancer. She agrees to continue to take prescription Vitamin D @50 ,000 IU every week and will follow-up for routine testing of Vitamin D, at least 2-3 times per year to avoid over-replacement.  3. Attention deficit disorder Well controlled.  No signs of complications, medication side effects, or red flags.  Continue current regimen.    Orders - lisdexamfetamine (VYVANSE) 20 MG capsule; Take 1 capsule (20 mg total) by mouth daily.  Dispense: 30 capsule; Refill: 0  4. Anxiety Behavior modification techniques were discussed today to help Cynthia [name] deal with her anxiety.  Orders and follow up as documented in patient record.  Orders - FLUoxetine (PROZAC) 20 MG capsule; Take 1 capsule (20 mg total) by mouth as directed.  Dispense: 90 capsule; Refill: 0  5. At risk for deficient intake of food Cynthia [name] was given approximately 15 minutes of  deficit intake of food prevention counseling today. Cynthia [name] is at risk for eating too few calories based on current food recall. She was encouraged to focus on meeting caloric and protein goals according to her recommended meal plan.   6. Class 3 severe obesity with serious comorbidity and body mass index (BMI) of 45.0 to 49.9 in adult, unspecified obesity type (HCC) - Cynthia -Weight Management (SAXENDA) 18 MG/3ML SOPN; Inject  0.5 mLs (3 mg total) into the skin daily.  Dispense: 5 pen; Refill: 0  Cynthia [name] is currently in the action stage of change. As such, her goal is to continue with weight loss efforts. She has agreed to the Category 2 Plan.   Exercise goals: PT.  Behavioral modification strategies: increasing lean protein intake and emotional eating strategies.  Cynthia [name] has agreed to follow-up with our clinic in 2 weeks. She was informed of the importance of frequent follow-up visits to maximize her success with intensive lifestyle modifications for her multiple health conditions.   Objective:   Blood pressure (!) 94/51, pulse (!) 106, temperature 98.4 F (36.9 C), temperature source Oral, height 5\' 2"  (1.575 m), weight 262 lb (118.8 kg), SpO2 95 %. Body mass index is 47.92 kg/m.  General: Cooperative, alert, well developed, in no acute distress. HEENT: Conjunctivae and lids unremarkable. Cardiovascular: Regular rhythm.  Lungs: Normal work of breathing. Neurologic: No focal deficits.   Lab Results  Component Value Date   CREATININE 0.76 06/29/2019   BUN 10 06/29/2019   NA 140 06/29/2019   K 4.3 06/29/2019   CL 106 06/29/2019   CO2 18 (L) 06/29/2019   Lab Results  Component Value Date   ALT 23 06/29/2019   AST 7 06/29/2019   ALKPHOS 99 06/29/2019   BILITOT 0.3 06/29/2019   Lab Results  Component Value Date   HGBA1C 5.4 06/29/2019   HGBA1C 5.4 10/30/2018   HGBA1C 5.5 01/16/2018   HGBA1C 5.4 10/03/2017   HGBA1C 5.3 05/01/2017   Lab Results  Component Value Date   INSULIN 17.7 06/29/2019   INSULIN 29.3 (H) 10/30/2018   INSULIN 12.9 01/16/2018   INSULIN 21.7 10/03/2017   INSULIN 11.1 05/01/2017   Lab Results  Component Value Date   TSH 1.640 05/01/2017   Lab Results  Component Value Date   CHOL 172 06/29/2019   HDL 49 06/29/2019   LDLCALC 110 (H) 06/29/2019   TRIG 65 06/29/2019   CHOLHDL 3.4 01/09/2012   Lab Results  Component Value Date   WBC 8.7 06/29/2019   HGB 14.3  06/29/2019   HCT 45.0 06/29/2019   MCV 91 06/29/2019   PLT 285 06/29/2019   Attestation Statements:   Reviewed by clinician on day of visit: allergies, medications, problem list, medical history, surgical history, family history, social history, and previous encounter notes.  I, 14/01/2019, CMA, am acting as Insurance claims handler for Energy manager, DO.  I have reviewed the above documentation for accuracy and completeness, and I agree with the above. [employer], DO

## 2019-12-17 DIAGNOSIS — M5030 Other cervical disc degeneration, unspecified cervical region: Secondary | ICD-10-CM | POA: Diagnosis not present

## 2019-12-17 DIAGNOSIS — M5136 Other intervertebral disc degeneration, lumbar region: Secondary | ICD-10-CM | POA: Diagnosis not present

## 2019-12-22 ENCOUNTER — Other Ambulatory Visit: Payer: Self-pay

## 2019-12-22 ENCOUNTER — Other Ambulatory Visit: Payer: Self-pay | Admitting: Allergy and Immunology

## 2019-12-22 DIAGNOSIS — M5030 Other cervical disc degeneration, unspecified cervical region: Secondary | ICD-10-CM | POA: Diagnosis not present

## 2019-12-22 DIAGNOSIS — M542 Cervicalgia: Secondary | ICD-10-CM | POA: Diagnosis not present

## 2019-12-22 DIAGNOSIS — M545 Low back pain: Secondary | ICD-10-CM | POA: Diagnosis not present

## 2019-12-22 DIAGNOSIS — Z6841 Body Mass Index (BMI) 40.0 and over, adult: Secondary | ICD-10-CM | POA: Diagnosis not present

## 2019-12-23 DIAGNOSIS — M25562 Pain in left knee: Secondary | ICD-10-CM | POA: Diagnosis not present

## 2019-12-23 DIAGNOSIS — M545 Low back pain: Secondary | ICD-10-CM | POA: Diagnosis not present

## 2019-12-23 DIAGNOSIS — M25561 Pain in right knee: Secondary | ICD-10-CM | POA: Diagnosis not present

## 2019-12-24 ENCOUNTER — Ambulatory Visit (INDEPENDENT_AMBULATORY_CARE_PROVIDER_SITE_OTHER): Payer: BC Managed Care – PPO | Admitting: Allergy & Immunology

## 2019-12-24 ENCOUNTER — Other Ambulatory Visit: Payer: Self-pay

## 2019-12-24 ENCOUNTER — Encounter: Payer: Self-pay | Admitting: Allergy & Immunology

## 2019-12-24 VITALS — BP 110/82 | HR 122 | Temp 98.3°F | Resp 18 | Ht 62.0 in | Wt 267.0 lb

## 2019-12-24 DIAGNOSIS — J4540 Moderate persistent asthma, uncomplicated: Secondary | ICD-10-CM

## 2019-12-24 DIAGNOSIS — J3089 Other allergic rhinitis: Secondary | ICD-10-CM | POA: Diagnosis not present

## 2019-12-24 DIAGNOSIS — J302 Other seasonal allergic rhinitis: Secondary | ICD-10-CM | POA: Diagnosis not present

## 2019-12-24 MED ORDER — LEVOCETIRIZINE DIHYDROCHLORIDE 5 MG PO TABS: ORAL_TABLET | ORAL | 1 refills | Status: DC

## 2019-12-24 MED ORDER — ARNUITY ELLIPTA 200 MCG/ACT IN AEPB
1.0000 | INHALATION_SPRAY | Freq: Every day | RESPIRATORY_TRACT | 3 refills | Status: DC
Start: 2019-12-24 — End: 2021-02-08

## 2019-12-24 MED ORDER — MONTELUKAST SODIUM 10 MG PO TABS: ORAL_TABLET | ORAL | 1 refills | Status: DC

## 2019-12-24 NOTE — Progress Notes (Signed)
FOLLOW UP  Date of Service/Encounter:  12/24/19   Assessment:   Moderate persistent asthma, uncomplicated   Seasonal and perennial allergic rhinitis (grasses, weeds, trees, ragweed, indoor/outdoor molds)  Bilateral knee osteoarthritis  On disability  Plan/Recommendations:   1. Moderate persistent asthma, uncomplicated - Lung testing looks great today.  - We will stop the Breo and step down to Arnuity 200mg  once puff once daily.  - If you notice that your coughing and wheezing is worsening, we can go back to the [facility]. - Daily controller medication(s): Arnuity 2105mcg one puff once daily - Prior to physical activity: albuterol 2 puffs 10-15 minutes before physical activity. - Rescue medications: albuterol 4 puffs every 4-6 hours as needed - Asthma control goals:  * Full participation in all desired activities (may need albuterol before activity) * Albuterol use two time or less a week on average (not counting use with activity) * Cough interfering with sleep two time or less a month * Oral steroids no more than once a year * No hospitalizations  2. Seasonal and perennial allergic rhinitis - Stop your Qnasl for now and start Xhance one spray per nostril daily (sample provided). - Call us if there is no improvement in one week or so.  - Continue with carbinoxamine 6mg  as needed.  - Continue with Xyzal 5mg  daily (you can use one twice daily).   3. Return in about 3 months (around 03/25/2020). This can be an in-person, a virtual Webex or a telephone follow up visit.  Subjective:   Cynthia [name] is a 34 y.o. female presenting today for follow up of  Chief Complaint  Patient presents with  . Ear Problem    Swimmers ear  . Asthma  . Allergic Rhinitis     Cynthia [name] has a history of the following: Patient Active Problem List   Diagnosis Date Noted  . Attention deficit disorder (ADD) without hyperactivity 09/17/2019  . Primary osteoarthritis of both knees 08/25/2019   . Anxiety 06/04/2019  . DDD (degenerative disc disease), cervical 06/03/2018  . Degeneration of lumbar intervertebral disc 06/03/2018  . Seasonal and perennial allergic rhinitis 05/05/2018  . Moderate persistent asthma 05/05/2018  . S/P arthroscopy of knee 04/11/2018  . Prediabetes 06/11/2017  . Vitamin D deficiency 06/11/2017  . Asthma     History obtained from: chart review and patient.  Cynthia [name] is a 34 y.o. female presenting for a follow up visit.  She was last seen in October 2020 by Dr. Verlin Fester.  At that time, she was continued on Breo 200/25 mcg 1 puff once daily for her allergic rhinitis, as she was continued montelukast as well as Qnasl.  She was also started on Xyzal.  She has a history of eustachian tube dysfunction.   Since last visit, she has done well.   She does report that she has been having some ear pressure bilaterally. She went to [place] and attempted to use Q-tips to remove all of the liquid. She has been using Debrox with some improvement. The right ear is much more annoying than the left ear. She does not think that she has an infection. She did have some itching. She returned on May 8th and the symptoms have been persisting through the whole month. She did have tubes twice as a child.   Asthma/Respiratory Symptom History: She actually went off of her Memory [place] in March and April because she was having platelt rich plasma (PRP) injections for her cartilage damage and arthritis in  both knees. She has had two major knee surgeries in the past. She has a history of arthritis when she injuried them around age 47. She was diagnosed with this modality within the last couple of year. She is back on her Breo.  She has been on Breo for a number of years, even before she follows with Dr. Nunzio Cobbs.  She has always been skeptical of her asthma diagnosis.  She did not feel any different when she was off of the [place] and felt no different when she restarted the [place].  Allergic Rhinitis Symptom  History: Her last testing was performed in October 2019.  She was positive to grasses, weeds, trees, ragweed, and all the mold mixes.  She currently has on the Qnasl, which she uses around once per week.  She has carbinoxamine which she uses ever so rarely. She has not needed antibiotics at all since the last visit. She has never been on allergy shots.   She does have a longstanding history of inflammatory arthritis.  She apparently has had 2 knee surgeries.  In the past, she was a gymnast and a runner, which did a number on her joints.  She has done extensive research into this and apparently she is not a candidate for total knee replacement because of her age.  She is currently on disability from her last job, which she was laid off from.  She is applying for SSI, but she wants to go back to work at some point. She apparently sees the premier physician at [facility] with [facility].   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  Otherwise, there have been no changes to her past medical history, surgical history, family history, or social history.    Review of Systems  Constitutional: Negative.  Negative for chills, fever, malaise/fatigue and weight loss.  HENT: Positive for congestion, ear pain and sinus pain. Negative for ear discharge and sore throat.   Eyes: Negative for pain, discharge and redness.  Respiratory: Negative for cough, sputum production, shortness of breath and wheezing.   Cardiovascular: Negative.  Negative for chest pain and palpitations.  Gastrointestinal: Negative for abdominal pain, constipation, diarrhea, heartburn, nausea and vomiting.  Musculoskeletal: Positive for joint pain.  Skin: Negative.  Negative for itching and rash.  Neurological: Negative for dizziness and headaches.  Endo/Heme/Allergies: Positive for environmental allergies. Does not bruise/bleed  easily.       Objective:   Blood pressure 110/82, pulse (!) 122, temperature 98.3 F (36.8 C), temperature source Temporal, resp. rate 18, height 5\' 2"  (1.575 m), weight 267 lb (121.1 kg), SpO2 98 %. Body mass index is 48.83 kg/m.   Physical Exam:  Physical Exam  Constitutional: She appears well-developed.  Obese female. Pleasant and very talkative.   HENT:  Head: Normocephalic and atraumatic.  Right Ear: Tympanic membrane, external ear and ear canal normal.  Left Ear: Tympanic membrane, external ear and ear canal normal.  Nose: Mucosal edema and rhinorrhea present. No nose lacerations, nasal deformity or septal deviation. No epistaxis. Right sinus exhibits no maxillary sinus tenderness and no frontal sinus tenderness. Left sinus exhibits no maxillary sinus tenderness and no frontal sinus tenderness.  Mouth/Throat: Uvula is midline and oropharynx is clear and moist. Mucous membranes are not pale and not dry.  No polyps appreciated.  Turbinates are enlarged.  The right TM does have  some scarring, there is minimal fluid behind it.  The left TM does have some fluid which is clear.  Eyes: Pupils are equal, round, and reactive to light. Conjunctivae and EOM are normal. Right eye exhibits no chemosis and no discharge. Left eye exhibits no chemosis and no discharge. Right conjunctiva is not injected. Left conjunctiva is not injected.  Cardiovascular: Normal rate, regular rhythm and normal heart sounds.  Respiratory: Effort normal and breath sounds normal. No accessory muscle usage. No tachypnea. No respiratory distress. She has no wheezes. She has no rhonchi. She has no rales. She exhibits no tenderness.  Moving air well in all lung fields.  Lymphadenopathy:    She has no cervical adenopathy.  Neurological: She is alert.  Skin: No abrasion, no petechiae and no rash noted. Rash is not papular, not vesicular and not urticarial. No erythema. No pallor.  Psychiatric: She has a normal mood and  affect.     Diagnostic studies:    Spirometry: results normal (FEV1: 2.59/88%, FVC: 3.21/92%, FEV1/FVC: 81%).    Spirometry consistent with normal pattern.    Allergy Studies: none       Malachi Bonds, MD  [facility]

## 2019-12-24 NOTE — Patient Instructions (Addendum)
1. Moderate persistent asthma, uncomplicated - Lung testing looks great today.  - We will stop the Breo and step down to Arnuity 200mg  once puff once daily.  - If you notice that your coughing and wheezing is worsening, we can go back to the [facility]. - Daily controller medication(s): Arnuity [facility] one puff once daily - Prior to physical activity: albuterol 2 puffs 10-15 minutes before physical activity. - Rescue medications: albuterol 4 puffs every 4-6 hours as needed - Asthma control goals:  * Full participation in all desired activities (may need albuterol before activity) * Albuterol use two time or less a week on average (not counting use with activity) * Cough interfering with sleep two time or less a month * Oral steroids no more than once a year * No hospitalizations  2. Seasonal and perennial allergic rhinitis - Stop your Qnasl for now and start Xhance one spray per nostril daily (sample provided). - Call if there is no improvement in one week or so.  - Continue with carbinoxamine 6mg  as needed.  - Continue with Xyzal 5mg  daily (you can use one twice daily).   3. Return in about 3 months (around 03/25/2020). This can be an in-person, a virtual Webex or a telephone follow up visit.   Please inform of any Emergency Department visits, hospitalizations, or changes in symptoms. Call before going to the ED for breathing or allergy symptoms since we might be able to fit you in for a sick visit. Feel free to contact 05/25/2020 anytime with any questions, problems, or concerns.  It was a pleasure to meet you today!  Websites that have reliable patient information: 1. American Academy of Asthma, Allergy, and Immunology: www.aaaai.org 2. Food Allergy Research and Education (FARE): foodallergy.org 3. Mothers of Asthmatics: http://www.asthmacommunitynetwork.org 4. American College of Allergy, Asthma, and Immunology: www.acaai.org   COVID-19 Vaccine Information can be found at:  [place] For questions related to vaccine distribution or appointments, please email [email or web address] or call [phone].     "Like" [place] on Facebook and Instagram for our latest updates!       HAPPY SPRING!  Make sure you are registered to vote! If you have moved or changed any of your contact information, you will need to get this updated before voting!  In some cases, you MAY be able to register to vote online: [email or web address]     I will wait who is at the allergist intradermal's has not been pricks already

## 2019-12-25 DIAGNOSIS — M25561 Pain in right knee: Secondary | ICD-10-CM | POA: Diagnosis not present

## 2019-12-25 DIAGNOSIS — M545 Low back pain: Secondary | ICD-10-CM | POA: Diagnosis not present

## 2019-12-25 DIAGNOSIS — M25562 Pain in left knee: Secondary | ICD-10-CM | POA: Diagnosis not present

## 2019-12-28 DIAGNOSIS — M545 Low back pain: Secondary | ICD-10-CM | POA: Diagnosis not present

## 2019-12-28 DIAGNOSIS — M25561 Pain in right knee: Secondary | ICD-10-CM | POA: Diagnosis not present

## 2019-12-28 DIAGNOSIS — M25562 Pain in left knee: Secondary | ICD-10-CM | POA: Diagnosis not present

## 2019-12-30 DIAGNOSIS — M25561 Pain in right knee: Secondary | ICD-10-CM | POA: Diagnosis not present

## 2019-12-30 DIAGNOSIS — M545 Low back pain: Secondary | ICD-10-CM | POA: Diagnosis not present

## 2019-12-30 DIAGNOSIS — M25562 Pain in left knee: Secondary | ICD-10-CM | POA: Diagnosis not present

## 2020-01-02 DIAGNOSIS — M5412 Radiculopathy, cervical region: Secondary | ICD-10-CM | POA: Diagnosis not present

## 2020-01-06 ENCOUNTER — Other Ambulatory Visit: Payer: Self-pay

## 2020-01-06 ENCOUNTER — Ambulatory Visit (INDEPENDENT_AMBULATORY_CARE_PROVIDER_SITE_OTHER): Payer: BC Managed Care – PPO | Admitting: Family Medicine

## 2020-01-06 ENCOUNTER — Encounter (INDEPENDENT_AMBULATORY_CARE_PROVIDER_SITE_OTHER): Payer: Self-pay | Admitting: Family Medicine

## 2020-01-06 VITALS — BP 108/75 | HR 125 | Temp 97.6°F | Ht 62.0 in | Wt 261.0 lb

## 2020-01-06 DIAGNOSIS — R7303 Prediabetes: Secondary | ICD-10-CM

## 2020-01-06 DIAGNOSIS — E559 Vitamin D deficiency, unspecified: Secondary | ICD-10-CM

## 2020-01-06 DIAGNOSIS — Z6841 Body Mass Index (BMI) 40.0 and over, adult: Secondary | ICD-10-CM

## 2020-01-06 DIAGNOSIS — F908 Attention-deficit hyperactivity disorder, other type: Secondary | ICD-10-CM | POA: Diagnosis not present

## 2020-01-06 DIAGNOSIS — Z9189 Other specified personal risk factors, not elsewhere classified: Secondary | ICD-10-CM

## 2020-01-06 MED ORDER — VITAMIN D (ERGOCALCIFEROL) 1.25 MG (50000 UNIT) PO CAPS: 50000.0000 [IU] | ORAL_CAPSULE | ORAL | 0 refills | Status: DC

## 2020-01-06 MED ORDER — LISDEXAMFETAMINE DIMESYLATE 20 MG PO CAPS: 20.0000 mg | ORAL_CAPSULE | Freq: Every day | ORAL | 0 refills | Status: DC

## 2020-01-06 MED ORDER — BD PEN NEEDLE NANO U/F 32G X 4 MM MISC: 1.0000 | Freq: Every day | 0 refills | Status: DC

## 2020-01-07 LAB — COMPREHENSIVE METABOLIC PANEL
ALT: 44 IU/L — ABNORMAL HIGH (ref 0–32)
AST: 27 IU/L (ref 0–40)
Albumin/Globulin Ratio: 1.4 (ref 1.2–2.2)
Albumin: 4.2 g/dL (ref 3.8–4.8)
Alkaline Phosphatase: 83 IU/L (ref 48–121)
BUN/Creatinine Ratio: 12 (ref 9–23)
BUN: 11 mg/dL (ref 6–20)
Bilirubin Total: 0.3 mg/dL (ref 0.0–1.2)
CO2: 19 mmol/L — ABNORMAL LOW (ref 20–29)
Calcium: 9.2 mg/dL (ref 8.7–10.2)
Chloride: 105 mmol/L (ref 96–106)
Creatinine, Ser: 0.94 mg/dL (ref 0.57–1.00)
GFR calc Af Amer: 92 mL/min/{1.73_m2} (ref >59)
GFR calc non Af Amer: 80 mL/min/{1.73_m2} (ref >59)
Globulin, Total: 2.9 g/dL (ref 1.5–4.5)
Glucose: 84 mg/dL (ref 65–99)
Potassium: 4.2 mmol/L (ref 3.5–5.2)
Sodium: 139 mmol/L (ref 134–144)
Total Protein: 7.1 g/dL (ref 6.0–8.5)

## 2020-01-07 LAB — CBC WITH DIFFERENTIAL/PLATELET
Basophils Absolute: 0 10*3/uL (ref 0.0–0.2)
Basos: 0 %
EOS (ABSOLUTE): 0.1 10*3/uL (ref 0.0–0.4)
Eos: 1 %
Hematocrit: 45 % (ref 34.0–46.6)
Hemoglobin: 14.9 g/dL (ref 11.1–15.9)
Immature Grans (Abs): 0 10*3/uL (ref 0.0–0.1)
Immature Granulocytes: 1 %
Lymphocytes Absolute: 1.9 10*3/uL (ref 0.7–3.1)
Lymphs: 24 %
MCH: 29.4 pg (ref 26.6–33.0)
MCHC: 33.1 g/dL (ref 31.5–35.7)
MCV: 89 fL (ref 79–97)
Monocytes Absolute: 0.5 10*3/uL (ref 0.1–0.9)
Monocytes: 7 %
Neutrophils Absolute: 5.2 10*3/uL (ref 1.4–7.0)
Neutrophils: 67 %
Platelets: 308 10*3/uL (ref 150–450)
RBC: 5.06 x10E6/uL (ref 3.77–5.28)
RDW: 13.3 % (ref 11.7–15.4)
WBC: 7.7 10*3/uL (ref 3.4–10.8)

## 2020-01-07 LAB — LIPID PANEL WITH LDL/HDL RATIO
Cholesterol, Total: 190 mg/dL (ref 100–199)
HDL: 45 mg/dL (ref >39)
LDL Chol Calc (NIH): 130 mg/dL — ABNORMAL HIGH (ref 0–99)
LDL/HDL Ratio: 2.9 ratio (ref 0.0–3.2)
Triglycerides: 84 mg/dL (ref 0–149)
VLDL Cholesterol Cal: 15 mg/dL (ref 5–40)

## 2020-01-07 LAB — VITAMIN D 25 HYDROXY (VIT D DEFICIENCY, FRACTURES): Vit D, 25-Hydroxy: 27.6 ng/mL — ABNORMAL LOW (ref 30.0–100.0)

## 2020-01-07 LAB — INSULIN, RANDOM: INSULIN: 12.3 u[IU]/mL (ref 2.6–24.9)

## 2020-01-08 DIAGNOSIS — M25562 Pain in left knee: Secondary | ICD-10-CM | POA: Diagnosis not present

## 2020-01-08 DIAGNOSIS — M545 Low back pain: Secondary | ICD-10-CM | POA: Diagnosis not present

## 2020-01-08 DIAGNOSIS — M25561 Pain in right knee: Secondary | ICD-10-CM | POA: Diagnosis not present

## 2020-01-11 DIAGNOSIS — M5030 Other cervical disc degeneration, unspecified cervical region: Secondary | ICD-10-CM | POA: Diagnosis not present

## 2020-01-11 NOTE — Progress Notes (Signed)
Chief Complaint:   OBESITY Cynthia [name] is here to discuss her progress with her obesity treatment plan along with follow-up of her obesity related diagnoses. Cynthia [name] is on the Category 2 Plan and states she is following her eating plan approximately 60% of the time. Cynthia [name] states she is doing pool exercises for 180 minutes.  Today's visit was #: 46 Starting weight: 264 lbs Starting date: 05/01/2017 Today's weight: 261 lbs Today's date: 01/06/2020 Total lbs lost to date: 3 lbs Total lbs lost since last in-office visit: 1 lb  Interim History: Cynthia [name]'s MRI neck result will be in next week.  She reports increased pain.  She is in [facility] for LBP, knee pain.  She says it is helping.  She has had increased stress at home.  Subjective:   1. Vitamin D deficiency Cynthia [name]'s Vitamin D level was 27.6 on 01/06/2020. She is currently taking prescription vitamin D 50,000 IU each week. She denies nausea, vomiting or muscle weakness.  2. Prediabetes Cynthia [name] has a diagnosis of prediabetes based on her elevated HgA1c and was informed this puts her at greater risk of developing diabetes. She continues to work on diet and exercise to decrease her risk of diabetes. She denies nausea or hypoglycemia.  Lab Results  Component Value Date   HGBA1C 5.4 06/29/2019   Lab Results  Component Value Date   INSULIN 12.3 01/06/2020   INSULIN 17.7 06/29/2019   INSULIN 29.3 (H) 10/30/2018   INSULIN 12.9 01/16/2018   INSULIN 21.7 10/03/2017   3. Attention deficit hyperactivity disorder (ADHD), other type, BED Cynthia [name] is taking Vyvanse 20 mg daily and says it is working well.  4. At risk for heart disease Cynthia [name] is at a higher than average risk for cardiovascular disease due to obesity.   Assessment/Plan:   1. Vitamin D deficiency Low Vitamin D level contributes to fatigue and are associated with obesity, breast, and colon cancer. She agrees to continue to take prescription Vitamin D @50 ,000 IU every week and will  follow-up for routine testing of Vitamin D, at least 2-3 times per year to avoid over-replacement.  Orders - VITAMIN D 25 Hydroxy (Vit-D Deficiency, Fractures) - Vitamin D, Ergocalciferol, (DRISDOL) 1.25 MG (50000 UNIT) CAPS capsule; Take 1 capsule (50,000 Units total) by mouth every 7 (seven) days.  Dispense: 4 capsule; Refill: 0  2. Prediabetes Cynthia [name] will continue to work on weight loss, exercise, and decreasing simple carbohydrates to help decrease the risk of diabetes.   Orders - CBC with Differential/Platelet - Comprehensive metabolic panel - Insulin, random - Lipid Panel With LDL/HDL Ratio - Insulin Pen Needle (BD PEN NEEDLE NANO U/F) 32G X 4 MM MISC; 1 Device by Does not apply route daily.  Dispense: 100 each; Refill: 0  3. Attention deficit hyperactivity disorder (ADHD), other type, BED Continue Vyvanse.  Refill provided.  Orders - lisdexamfetamine (VYVANSE) 20 MG capsule; Take 1 capsule (20 mg total) by mouth daily.  Dispense: 30 capsule; Refill: 0  4. At risk for heart disease Cynthia [name] was given approximately 15 minutes of coronary artery disease prevention counseling today. She is 34 y.o. female and has risk factors for heart disease including obesity. We discussed intensive lifestyle modifications today with an emphasis on specific weight loss instructions and strategies.   Repetitive spaced learning was employed today to elicit superior memory formation and behavioral change.  5. Class 3 severe obesity with serious comorbidity and body mass index (BMI) of 45.0 to 49.9 in adult, unspecified obesity type (HCC)  Cynthia [name] is currently in the action stage of change. As such, her goal is to continue with weight loss efforts. She has agreed to the Category 2 Plan.   Exercise goals: For substantial health benefits, adults should do at least 150 minutes (2 hours and 30 minutes) a week of moderate-intensity, or 75 minutes (1 hour and 15 minutes) a week of vigorous-intensity aerobic physical  activity, or an equivalent combination of moderate- and vigorous-intensity aerobic activity. Aerobic activity should be performed in episodes of at least 10 minutes, and preferably, it should be spread throughout the week.  Behavioral modification strategies: increasing lean protein intake and increasing water intake.  Cynthia [name] has agreed to follow-up with our clinic in 3 weeks. She was informed of the importance of frequent follow-up visits to maximize her success with intensive lifestyle modifications for her multiple health conditions.   Cynthia [name] was informed we would discuss her lab results at her next visit unless there is a critical issue that needs to be addressed sooner. Cynthia [name] agreed to keep her next visit at the agreed upon time to discuss these results.  Objective:   Blood pressure 108/75, pulse (!) 125, temperature 97.6 F (36.4 C), temperature source Oral, height 5\' 2"  (1.575 m), weight 261 lb (118.4 kg), SpO2 95 %. Body mass index is 47.74 kg/m.  General: Cooperative, alert, well developed, in no acute distress. HEENT: Conjunctivae and lids unremarkable. Cardiovascular: Regular rhythm.  Lungs: Normal work of breathing. Neurologic: No focal deficits.   Lab Results  Component Value Date   CREATININE 0.94 01/06/2020   BUN 11 01/06/2020   NA 139 01/06/2020   K 4.2 01/06/2020   CL 105 01/06/2020   CO2 19 (L) 01/06/2020   Lab Results  Component Value Date   ALT 44 (H) 01/06/2020   AST 27 01/06/2020   ALKPHOS 83 01/06/2020   BILITOT 0.3 01/06/2020   Lab Results  Component Value Date   HGBA1C 5.4 06/29/2019   HGBA1C 5.4 10/30/2018   HGBA1C 5.5 01/16/2018   HGBA1C 5.4 10/03/2017   HGBA1C 5.3 05/01/2017   Lab Results  Component Value Date   INSULIN 12.3 01/06/2020   INSULIN 17.7 06/29/2019   INSULIN 29.3 (H) 10/30/2018   INSULIN 12.9 01/16/2018   INSULIN 21.7 10/03/2017   Lab Results  Component Value Date   TSH 1.640 05/01/2017   Lab Results  Component Value  Date   CHOL 190 01/06/2020   HDL 45 01/06/2020   LDLCALC 130 (H) 01/06/2020   TRIG 84 01/06/2020   CHOLHDL 3.4 01/09/2012   Lab Results  Component Value Date   WBC 7.7 01/06/2020   HGB 14.9 01/06/2020   HCT 45.0 01/06/2020   MCV 89 01/06/2020   PLT 308 01/06/2020   Attestation Statements:   Reviewed by clinician on day of visit: allergies, medications, problem list, medical history, surgical history, family history, social history, and previous encounter notes.  I, Water quality scientist, CMA, am acting as transcriptionist for Briscoe Deutscher, DO  I have reviewed the above documentation for accuracy and completeness, and I agree with the above. Briscoe Deutscher, DO

## 2020-01-13 DIAGNOSIS — M25562 Pain in left knee: Secondary | ICD-10-CM | POA: Diagnosis not present

## 2020-01-13 DIAGNOSIS — M545 Low back pain: Secondary | ICD-10-CM | POA: Diagnosis not present

## 2020-01-13 DIAGNOSIS — M25561 Pain in right knee: Secondary | ICD-10-CM | POA: Diagnosis not present

## 2020-01-15 DIAGNOSIS — M5030 Other cervical disc degeneration, unspecified cervical region: Secondary | ICD-10-CM | POA: Diagnosis not present

## 2020-01-15 DIAGNOSIS — M5136 Other intervertebral disc degeneration, lumbar region: Secondary | ICD-10-CM | POA: Diagnosis not present

## 2020-01-15 DIAGNOSIS — R200 Anesthesia of skin: Secondary | ICD-10-CM | POA: Diagnosis not present

## 2020-01-18 DIAGNOSIS — M545 Low back pain: Secondary | ICD-10-CM | POA: Diagnosis not present

## 2020-01-18 DIAGNOSIS — M25561 Pain in right knee: Secondary | ICD-10-CM | POA: Diagnosis not present

## 2020-01-18 DIAGNOSIS — M25562 Pain in left knee: Secondary | ICD-10-CM | POA: Diagnosis not present

## 2020-01-18 DIAGNOSIS — M542 Cervicalgia: Secondary | ICD-10-CM | POA: Diagnosis not present

## 2020-01-19 DIAGNOSIS — M545 Low back pain: Secondary | ICD-10-CM | POA: Diagnosis not present

## 2020-01-19 DIAGNOSIS — M25561 Pain in right knee: Secondary | ICD-10-CM | POA: Diagnosis not present

## 2020-01-19 DIAGNOSIS — M25562 Pain in left knee: Secondary | ICD-10-CM | POA: Diagnosis not present

## 2020-01-21 DIAGNOSIS — M545 Low back pain: Secondary | ICD-10-CM | POA: Diagnosis not present

## 2020-01-21 DIAGNOSIS — M25561 Pain in right knee: Secondary | ICD-10-CM | POA: Diagnosis not present

## 2020-01-21 DIAGNOSIS — M25562 Pain in left knee: Secondary | ICD-10-CM | POA: Diagnosis not present

## 2020-01-26 DIAGNOSIS — M25562 Pain in left knee: Secondary | ICD-10-CM | POA: Diagnosis not present

## 2020-01-26 DIAGNOSIS — M25561 Pain in right knee: Secondary | ICD-10-CM | POA: Diagnosis not present

## 2020-01-26 DIAGNOSIS — M545 Low back pain: Secondary | ICD-10-CM | POA: Diagnosis not present

## 2020-01-28 DIAGNOSIS — M25561 Pain in right knee: Secondary | ICD-10-CM | POA: Diagnosis not present

## 2020-01-28 DIAGNOSIS — M25562 Pain in left knee: Secondary | ICD-10-CM | POA: Diagnosis not present

## 2020-01-28 DIAGNOSIS — M545 Low back pain: Secondary | ICD-10-CM | POA: Diagnosis not present

## 2020-02-01 ENCOUNTER — Encounter (INDEPENDENT_AMBULATORY_CARE_PROVIDER_SITE_OTHER): Payer: Self-pay | Admitting: Family Medicine

## 2020-02-01 ENCOUNTER — Other Ambulatory Visit: Payer: Self-pay

## 2020-02-01 ENCOUNTER — Ambulatory Visit (INDEPENDENT_AMBULATORY_CARE_PROVIDER_SITE_OTHER): Payer: BC Managed Care – PPO | Admitting: Family Medicine

## 2020-02-01 VITALS — BP 110/76 | HR 128 | Temp 98.3°F | Ht 62.0 in | Wt 258.0 lb

## 2020-02-01 DIAGNOSIS — Z9189 Other specified personal risk factors, not elsewhere classified: Secondary | ICD-10-CM

## 2020-02-01 DIAGNOSIS — R7401 Elevation of levels of liver transaminase levels: Secondary | ICD-10-CM

## 2020-02-01 DIAGNOSIS — E559 Vitamin D deficiency, unspecified: Secondary | ICD-10-CM | POA: Diagnosis not present

## 2020-02-01 DIAGNOSIS — F424 Excoriation (skin-picking) disorder: Secondary | ICD-10-CM | POA: Diagnosis not present

## 2020-02-01 DIAGNOSIS — E8881 Metabolic syndrome: Secondary | ICD-10-CM

## 2020-02-01 DIAGNOSIS — Z6841 Body Mass Index (BMI) 40.0 and over, adult: Secondary | ICD-10-CM

## 2020-02-01 DIAGNOSIS — M62838 Other muscle spasm: Secondary | ICD-10-CM

## 2020-02-01 NOTE — Progress Notes (Signed)
Chief Complaint:   OBESITY Cynthia [name] is here to discuss her progress with her obesity treatment plan along with follow-up of her obesity related diagnoses. Cynthia [name] is on keeping a food journal and adhering to recommended goals of 1400 calories and 80-90 grams of protein and states she is following her eating plan approximately 60% of the time. Cynthia [name] states she is using the pool and doing resistance training for 2.5 hours per week.  Today's visit was #: 47 Starting weight: 264 lbs Starting date: 05/01/2017 Today's weight: 258 lbs Today's date: 02/01/2020 Total lbs lost to date: 6 lbs Total lbs lost since last in-office visit: 3 lbs  Interim History: Cynthia [name] says she has been under increased stress lately.  Endorses increased hunger.  She says she has increased her protein intake.  She will be increasing Saxenda and she will add a protein snack.  Subjective:   1. Vitamin D deficiency Cynthia [name]'s Vitamin D level was 27.6 on 01/06/2020. She is currently taking no vitamin D supplement.  She has vitamin D at home but has been noncompliant.   2. Elevated ALT measurement She denies abdominal pain or jaundice and has never been told of any liver problems in the past. She denies excessive alcohol intake.  Lab Results  Component Value Date   ALT 44 (H) 01/06/2020   AST 27 01/06/2020   ALKPHOS 83 01/06/2020   BILITOT 0.3 01/06/2020   3. Insulin resistance Cynthia [name] has a diagnosis of insulin resistance based on her elevated fasting insulin level >5. She continues to work on diet and exercise to decrease her risk of diabetes.  Lab Results  Component Value Date   INSULIN 12.3 01/06/2020   INSULIN 17.7 06/29/2019   INSULIN 29.3 (H) 10/30/2018   INSULIN 12.9 01/16/2018   INSULIN 21.7 10/03/2017   Lab Results  Component Value Date   HGBA1C 5.4 06/29/2019   4. Skin picking habit Cynthia [name] has OCD and the habit of picking at her skin.  5. Muscle spasm Cynthia [name] has fibromyalgia.  She will be seeing  Neurology in one month.  She asks for a refill on Robaxin today for pain/spasm in her low back.  Assessment/Plan:   1. Vitamin D deficiency Low Vitamin D level contributes to fatigue and are associated with obesity, breast, and colon cancer. She agrees to continue to take prescription Vitamin D @50 ,000 IU every week and will follow-up for routine testing of Vitamin D, at least 2-3 times per year to avoid over-replacement.  2. Elevated ALT measurement Elevated liver transaminases with an ALT predominance combined with obesity and insulin resistance is characteristic, but not diagnostic of non-alcoholic fatty liver disease (NAFLD). NAFLD is the 2nd leading cause of liver transplant in adults. Treatment includes weight loss, elimination of sweet drinks, including juice, avoidance of high fructose corn syrup, and exercise. As always, avoiding alcohol consumption is important.  3. Insulin resistance Cynthia [name] will continue to work on weight loss, exercise, and decreasing simple carbohydrates to help decrease the risk of diabetes. Cynthia [name] agreed to follow-up with Maralyn Sago as directed to closely monitor her progress.  4. Skin picking habit Discussed Psychiatry and gave resources.  5. Muscle spasm This issue directly impacts care plan for optimization of BMI and metabolic health as it impacts the patient's ability to make lifestyle changes. Will refill Robaxin today as courtesy.  Orders - methocarbamol (ROBAXIN) 500 MG tablet; Take 1 tablet (500 mg total) by mouth 2 (two) times daily as needed for muscle spasms.  6. At risk for heart disease Cynthia [name] was given approximately 15 minutes of coronary artery disease prevention counseling today. She is 34 y.o. female and has risk factors for heart disease including obesity. We discussed intensive lifestyle modifications today with an emphasis on specific weight loss instructions and strategies.   Repetitive spaced learning was employed today to elicit superior memory  formation and behavioral change.  7. Class 3 severe obesity with serious comorbidity and body mass index (BMI) of 45.0 to 49.9 in adult, unspecified obesity type Cynthia [name]) Cynthia [name] is currently in the action stage of change. As such, her goal is to continue with weight loss efforts. She has agreed to keeping a food journal and adhering to recommended goals of 1400 calories and 80-90 grams of protein.   Exercise goals: For substantial health benefits, adults should do at least 150 minutes (2 hours and 30 minutes) a week of moderate-intensity, or 75 minutes (1 hour and 15 minutes) a week of vigorous-intensity aerobic physical activity, or an equivalent combination of moderate- and vigorous-intensity aerobic activity. Aerobic activity should be performed in episodes of at least 10 minutes, and preferably, it should be spread throughout the week.  Behavioral modification strategies: increasing lean protein intake.  Cynthia [name] has agreed to follow-up with our clinic in 2-3 weeks. She was informed of the importance of frequent follow-up visits to maximize her success with intensive lifestyle modifications for her multiple health conditions.   Objective:   Blood pressure 110/76, pulse (!) 128, temperature 98.3 F (36.8 C), temperature source Oral, height 5\' 2"  (1.575 m), weight 258 lb (117 kg), SpO2 95 %. Body mass index is 47.19 kg/m.  General: Cooperative, alert, well developed, in no acute distress. HEENT: Conjunctivae and lids unremarkable. Cardiovascular: Regular rhythm.  Lungs: Normal work of breathing. Neurologic: No focal deficits.   Lab Results  Component Value Date   CREATININE 0.94 01/06/2020   BUN 11 01/06/2020   NA 139 01/06/2020   K 4.2 01/06/2020   CL 105 01/06/2020   CO2 19 (L) 01/06/2020   Lab Results  Component Value Date   ALT 44 (H) 01/06/2020   AST 27 01/06/2020   ALKPHOS 83 01/06/2020   BILITOT 0.3 01/06/2020   Lab Results  Component Value Date   HGBA1C 5.4 06/29/2019    HGBA1C 5.4 10/30/2018   HGBA1C 5.5 01/16/2018   HGBA1C 5.4 10/03/2017   HGBA1C 5.3 05/01/2017   Lab Results  Component Value Date   INSULIN 12.3 01/06/2020   INSULIN 17.7 06/29/2019   INSULIN 29.3 (H) 10/30/2018   INSULIN 12.9 01/16/2018   INSULIN 21.7 10/03/2017   Lab Results  Component Value Date   TSH 1.640 05/01/2017   Lab Results  Component Value Date   CHOL 190 01/06/2020   HDL 45 01/06/2020   LDLCALC 130 (H) 01/06/2020   TRIG 84 01/06/2020   CHOLHDL 3.4 01/09/2012   Lab Results  Component Value Date   WBC 7.7 01/06/2020   HGB 14.9 01/06/2020   HCT 45.0 01/06/2020   MCV 89 01/06/2020   PLT 308 01/06/2020   Attestation Statements:   Reviewed by clinician on day of visit: allergies, medications, problem list, medical history, surgical history, family history, social history, and previous encounter notes.  I, 01/08/2020, CMA, am acting as transcriptionist for Insurance claims handler, DO  I have reviewed the above documentation for accuracy and completeness, and I agree with the above. Helane Rima, DO

## 2020-02-02 MED ORDER — METHOCARBAMOL 500 MG PO TABS: 500.0000 mg | ORAL_TABLET | Freq: Two times a day (BID) | ORAL | 0 refills | Status: AC | PRN

## 2020-02-04 ENCOUNTER — Encounter (INDEPENDENT_AMBULATORY_CARE_PROVIDER_SITE_OTHER): Payer: Self-pay | Admitting: Family Medicine

## 2020-02-04 DIAGNOSIS — M545 Low back pain: Secondary | ICD-10-CM | POA: Diagnosis not present

## 2020-02-04 DIAGNOSIS — M25562 Pain in left knee: Secondary | ICD-10-CM | POA: Diagnosis not present

## 2020-02-04 DIAGNOSIS — M25561 Pain in right knee: Secondary | ICD-10-CM | POA: Diagnosis not present

## 2020-02-04 MED ORDER — SAXENDA 18 MG/3ML ~~LOC~~ SOPN: 3.0000 mg | PEN_INJECTOR | Freq: Every day | SUBCUTANEOUS | 0 refills | Status: DC

## 2020-02-04 NOTE — Telephone Encounter (Signed)
Please see

## 2020-02-09 DIAGNOSIS — M25561 Pain in right knee: Secondary | ICD-10-CM | POA: Diagnosis not present

## 2020-02-09 DIAGNOSIS — M25562 Pain in left knee: Secondary | ICD-10-CM | POA: Diagnosis not present

## 2020-02-09 DIAGNOSIS — M545 Low back pain: Secondary | ICD-10-CM | POA: Diagnosis not present

## 2020-02-12 DIAGNOSIS — F4323 Adjustment disorder with mixed anxiety and depressed mood: Secondary | ICD-10-CM | POA: Diagnosis not present

## 2020-02-16 DIAGNOSIS — M25561 Pain in right knee: Secondary | ICD-10-CM | POA: Diagnosis not present

## 2020-02-16 DIAGNOSIS — M545 Low back pain: Secondary | ICD-10-CM | POA: Diagnosis not present

## 2020-02-16 DIAGNOSIS — M25562 Pain in left knee: Secondary | ICD-10-CM | POA: Diagnosis not present

## 2020-02-17 DIAGNOSIS — F4323 Adjustment disorder with mixed anxiety and depressed mood: Secondary | ICD-10-CM | POA: Diagnosis not present

## 2020-02-18 ENCOUNTER — Ambulatory Visit (INDEPENDENT_AMBULATORY_CARE_PROVIDER_SITE_OTHER): Payer: BC Managed Care – PPO | Admitting: Family Medicine

## 2020-02-18 DIAGNOSIS — M545 Low back pain: Secondary | ICD-10-CM | POA: Diagnosis not present

## 2020-02-18 DIAGNOSIS — M25562 Pain in left knee: Secondary | ICD-10-CM | POA: Diagnosis not present

## 2020-02-18 DIAGNOSIS — M25561 Pain in right knee: Secondary | ICD-10-CM | POA: Diagnosis not present

## 2020-02-23 ENCOUNTER — Ambulatory Visit (INDEPENDENT_AMBULATORY_CARE_PROVIDER_SITE_OTHER): Payer: BC Managed Care – PPO | Admitting: Family Medicine

## 2020-02-23 ENCOUNTER — Encounter (INDEPENDENT_AMBULATORY_CARE_PROVIDER_SITE_OTHER): Payer: Self-pay | Admitting: Family Medicine

## 2020-02-23 ENCOUNTER — Other Ambulatory Visit: Payer: Self-pay

## 2020-02-23 VITALS — BP 108/76 | Temp 98.4°F | Ht 62.0 in | Wt 261.0 lb

## 2020-02-23 DIAGNOSIS — Z9189 Other specified personal risk factors, not elsewhere classified: Secondary | ICD-10-CM | POA: Diagnosis not present

## 2020-02-23 DIAGNOSIS — F411 Generalized anxiety disorder: Secondary | ICD-10-CM | POA: Diagnosis not present

## 2020-02-23 DIAGNOSIS — M545 Low back pain: Secondary | ICD-10-CM | POA: Diagnosis not present

## 2020-02-23 DIAGNOSIS — F5081 Binge eating disorder: Secondary | ICD-10-CM

## 2020-02-23 DIAGNOSIS — Z6841 Body Mass Index (BMI) 40.0 and over, adult: Secondary | ICD-10-CM

## 2020-02-23 DIAGNOSIS — F50819 Binge eating disorder, unspecified: Secondary | ICD-10-CM

## 2020-02-23 DIAGNOSIS — M25562 Pain in left knee: Secondary | ICD-10-CM | POA: Diagnosis not present

## 2020-02-23 DIAGNOSIS — M25561 Pain in right knee: Secondary | ICD-10-CM | POA: Diagnosis not present

## 2020-02-23 MED ORDER — BUSPIRONE HCL 7.5 MG PO TABS: 7.5000 mg | ORAL_TABLET | Freq: Three times a day (TID) | ORAL | 0 refills | Status: DC

## 2020-02-23 MED ORDER — SAXENDA 18 MG/3ML ~~LOC~~ SOPN: 3.0000 mg | PEN_INJECTOR | Freq: Every day | SUBCUTANEOUS | 0 refills | Status: DC

## 2020-02-23 MED ORDER — LISDEXAMFETAMINE DIMESYLATE 20 MG PO CAPS: 20.0000 mg | ORAL_CAPSULE | Freq: Every day | ORAL | 0 refills | Status: DC

## 2020-02-24 NOTE — Progress Notes (Signed)
Chief Complaint:   OBESITY Cynthia [name] is here to discuss her progress with her obesity treatment plan along with follow-up of her obesity related diagnoses. Cynthia [name] is on keeping a food journal and adhering to recommended goals of 1400 calories and 80-90 grams of protein daily and states she is following her eating plan approximately 60% of the time. Cynthia [name] states she is doing 0 minutes 0 times per week.  Today's visit was #: 48 Starting weight: 264 lbs Starting date: 05/01/2017 Today's weight: 261 lbs Today's date: 02/23/2020 Total lbs lost to date: 3 Total lbs lost since last in-office visit: 0  Interim History: Cynthia [name] went up to 2.4 mg of Saxenda and she had minimal appetite if any. She decreased Saxenda and can tolerate some food now. She has started therapy which she has found very helpful. Calorie wise she sometimes barely gets in 1,000 calories at a higher close of Saxenda. Protein wise she gets in 50 grams of on days she struggled.  Subjective:   1. GAD (generalized anxiety disorder) Cynthia [name] is on Prozac 30 mg PO daily. She is still experiencing situational anxiety and feelings of being overwhelmed.  2. Binge eating disorder Cynthia [name] notes significant symptoms if not on Vyvanse, and she notes symptoms are better controlled.  3. At risk for side effect of medication Cynthia [name] is at risk for drug side effects due to increased Saxenda dose.  Assessment/Plan:   1. GAD (generalized anxiety disorder) Cynthia [name] agreed to start Buspar 7.5 mg PO TID with no refills, and she will follow up as directed.  - busPIRone (BUSPAR) 7.5 MG tablet; Take 1 tablet (7.5 mg total) by mouth 3 (three) times daily.  Dispense: 90 tablet; Refill: 0  2. Binge eating disorder We will refill Vyvanse 20 mg PO daily with no refills, and Tashera will follow up as directed.  - lisdexamfetamine (VYVANSE) 20 MG capsule; Take 1 capsule (20 mg total) by mouth daily.  Dispense: 30 capsule; Refill: 0  3. At risk for side effect  of medication Jadon was given approximately 15 minutes of drug side effect counseling today.  We discussed side effect possibility and risk versus benefits. Cynthia [name] agreed to the medication and will contact this office if these side effects are intolerable.  Repetitive spaced learning was employed today to elicit superior memory formation and behavioral change.  4. Class 3 severe obesity with serious comorbidity and body mass index (BMI) of 45.0 to 49.9 in adult, unspecified obesity type [facility]) Jaeleen is currently in the action stage of change. As such, her goal is to continue with weight loss efforts. She has agreed to the Category 2 Plan or keeping a food journal and adhering to recommended goals of 1300-1400 calories and 85+ grams of protein daily.   We discussed various medication options to help Cynthia [name] with her weight loss efforts and we both agreed to continue Saxenda and we will refill for 1 month.  - Liraglutide -Weight Management (SAXENDA) 18 MG/3ML SOPN; Inject 0.5 mLs (3 mg total) into the skin daily.  Dispense: 5 pen; Refill: 0  Exercise goals: No exercise has been prescribed at this time.  Behavioral modification strategies: increasing lean protein intake, meal planning and cooking strategies, keeping healthy foods in the home and planning for success.  Cynthia [name] has agreed to follow-up with our clinic in 2 weeks. She was informed of the importance of frequent follow-up visits to maximize her success with intensive lifestyle modifications for her multiple health conditions.   Objective:  Blood pressure 108/76, temperature 98.4 F (36.9 C), height 5\' 2"  (1.575 m), weight 261 lb (118.4 kg), SpO2 96 %. Body mass index is 47.74 kg/m.  General: Cooperative, alert, well developed, in no acute distress. HEENT: Conjunctivae and lids unremarkable. Cardiovascular: Regular rhythm.  Lungs: Normal work of breathing. Neurologic: No focal deficits.   Lab Results  Component Value Date    CREATININE 0.94 01/06/2020   BUN 11 01/06/2020   NA 139 01/06/2020   K 4.2 01/06/2020   CL 105 01/06/2020   CO2 19 (L) 01/06/2020   Lab Results  Component Value Date   ALT 44 (H) 01/06/2020   AST 27 01/06/2020   ALKPHOS 83 01/06/2020   BILITOT 0.3 01/06/2020   Lab Results  Component Value Date   HGBA1C 5.4 06/29/2019   HGBA1C 5.4 10/30/2018   HGBA1C 5.5 01/16/2018   HGBA1C 5.4 10/03/2017   HGBA1C 5.3 05/01/2017   Lab Results  Component Value Date   INSULIN 12.3 01/06/2020   INSULIN 17.7 06/29/2019   INSULIN 29.3 (H) 10/30/2018   INSULIN 12.9 01/16/2018   INSULIN 21.7 10/03/2017   Lab Results  Component Value Date   TSH 1.640 05/01/2017   Lab Results  Component Value Date   CHOL 190 01/06/2020   HDL 45 01/06/2020   LDLCALC 130 (H) 01/06/2020   TRIG 84 01/06/2020   CHOLHDL 3.4 01/09/2012   Lab Results  Component Value Date   WBC 7.7 01/06/2020   HGB 14.9 01/06/2020   HCT 45.0 01/06/2020   MCV 89 01/06/2020   PLT 308 01/06/2020   No results found for: IRON, TIBC, FERRITIN  Attestation Statements:   Reviewed by clinician on day of visit: allergies, medications, problem list, medical history, surgical history, family history, social history, and previous encounter notes.   I, 01/08/2020, am acting as transcriptionist for Burt Knack, MD.  I have reviewed the above documentation for accuracy and completeness, and I agree with the above. - Reuben Likes, MD

## 2020-02-25 DIAGNOSIS — M25561 Pain in right knee: Secondary | ICD-10-CM | POA: Diagnosis not present

## 2020-02-25 DIAGNOSIS — M545 Low back pain: Secondary | ICD-10-CM | POA: Diagnosis not present

## 2020-02-25 DIAGNOSIS — M25562 Pain in left knee: Secondary | ICD-10-CM | POA: Diagnosis not present

## 2020-03-03 ENCOUNTER — Other Ambulatory Visit (INDEPENDENT_AMBULATORY_CARE_PROVIDER_SITE_OTHER): Payer: Self-pay | Admitting: Family Medicine

## 2020-03-04 DIAGNOSIS — F4323 Adjustment disorder with mixed anxiety and depressed mood: Secondary | ICD-10-CM | POA: Diagnosis not present

## 2020-03-07 ENCOUNTER — Ambulatory Visit (INDEPENDENT_AMBULATORY_CARE_PROVIDER_SITE_OTHER): Payer: BC Managed Care – PPO | Admitting: Family Medicine

## 2020-03-07 ENCOUNTER — Encounter (INDEPENDENT_AMBULATORY_CARE_PROVIDER_SITE_OTHER): Payer: Self-pay | Admitting: Family Medicine

## 2020-03-07 ENCOUNTER — Other Ambulatory Visit: Payer: Self-pay

## 2020-03-07 VITALS — BP 120/81 | HR 126 | Temp 98.6°F | Ht 62.0 in | Wt 260.0 lb

## 2020-03-07 DIAGNOSIS — F50819 Binge eating disorder, unspecified: Secondary | ICD-10-CM

## 2020-03-07 DIAGNOSIS — F5081 Binge eating disorder: Secondary | ICD-10-CM

## 2020-03-07 DIAGNOSIS — F411 Generalized anxiety disorder: Secondary | ICD-10-CM

## 2020-03-07 DIAGNOSIS — Z9189 Other specified personal risk factors, not elsewhere classified: Secondary | ICD-10-CM

## 2020-03-07 DIAGNOSIS — Z6841 Body Mass Index (BMI) 40.0 and over, adult: Secondary | ICD-10-CM

## 2020-03-07 NOTE — Progress Notes (Signed)
Chief Complaint:   OBESITY Cynthia [name] is here to discuss her progress with her obesity treatment plan along with follow-up of her obesity related diagnoses. Cynthia [name] is on the Category 2 Plan and keeping a food journal and adhering to recommended goals of 1300-1400 calories and 85+ grams of protein and states she is following her eating plan approximately 60% of the time. Cynthia [name] states she is doing aquatic therapy and using resistance bands for 60 minutes 3 times per week.  Today's visit was #: 49 Starting weight: 264 lbs Starting date: 05/01/2017 Today's weight: 260 lbs Today's date: 03/07/2020 Total lbs lost to date: 4 lbs Total lbs lost since last in-office visit: 1 lb  Interim History: Cynthia [name] is getting therapy at [facility].  She says she is under increased stress due to family and disability/finances.  She has a new injury to her left knee.  PT is on hold for now.  Assessment/Plan:   1. Binge eating disorder, ADHD Shantel meets binge eating disorder (BED) requirements, including: binging 4-13 times a week , eating until feeling uncomfortably full, eating large amounts of food when not feeling physically full and depressed or guilty about binge eating.   People who binge eat feel as if they don't have control over how much they eat and have feelings of guilt or self-loathing after a binge eating episode. [employer] estimates that about 30 percent of adults with binge eating disorder also have a history of ADHD. Binge eating and related obesity can underlie health problems like heart disease and diabetes. The FDA has approved Vyvanse as a treatment option for both ADHD and binge eating. Vyvanse targets the brain's reward center by increasing the levels of dopamine and norepinephrine, the chemicals of the brain responsible for feelings of pleasure. Mindful eating is the recommended nutritional approach to treating BED.   Orders - lisdexamfetamine (VYVANSE) 20 MG capsule; Take 1  capsule (20 mg total) by mouth daily.  Dispense: 30 capsule; Refill: 0  2. GAD (generalized anxiety disorder) with emotional eating The current medical regimen is effective;  continue present plan and medications.  - FLUoxetine (PROZAC) 10 MG capsule; Take 3 capsules (30 mg total) by mouth daily.  Dispense: 90 capsule; Refill: 0  3. At risk for activity intolerance Cynthia [name] was given approximately 15 minutes of exercise intolerance counseling today. She is 34 y.o. female and has risk factors exercise intolerance including obesity and knee pain. We discussed intensive lifestyle modifications today with an emphasis on specific weight loss instructions and strategies. Cynthia [name] will slowly increase activity as tolerated.  Repetitive spaced learning was employed today to elicit superior memory formation and behavioral change.  4. Class 3 severe obesity with serious comorbidity and body mass index (BMI) of 45.0 to 49.9 in adult, unspecified obesity type [facility]) Cynthia [name] is currently in the action stage of change. As such, her goal is to continue with weight loss efforts. She has agreed to the Category 2 Plan.   Exercise goals: For substantial health benefits, adults should do at least 150 minutes (2 hours and 30 minutes) a week of moderate-intensity, or 75 minutes (1 hour and 15 minutes) a week of vigorous-intensity aerobic physical activity, or an equivalent combination of moderate- and vigorous-intensity aerobic activity. Aerobic activity should be performed in episodes of at least 10 minutes, and preferably, it should be spread throughout the week.  Behavioral modification strategies: increasing lean protein intake and increasing water intake.  Cynthia [name] has agreed to follow-up with our clinic  in 2-3 weeks. She was informed of the importance of frequent follow-up visits to maximize her success with intensive lifestyle modifications for her multiple health conditions.   Objective:   Blood pressure 120/81, pulse  (!) 126, temperature 98.6 F (37 C), temperature source Oral, height 5\' 2"  (1.575 m), weight 260 lb (117.9 kg), SpO2 98 %. Body mass index is 47.55 kg/m.  General: Cooperative, alert, well developed, in no acute distress. HEENT: Conjunctivae and lids unremarkable. Cardiovascular: Regular rhythm.  Lungs: Normal work of breathing. Neurologic: No focal deficits.   Lab Results  Component Value Date   CREATININE 0.94 01/06/2020   BUN 11 01/06/2020   NA 139 01/06/2020   K 4.2 01/06/2020   CL 105 01/06/2020   CO2 19 (L) 01/06/2020   Lab Results  Component Value Date   ALT 44 (H) 01/06/2020   AST 27 01/06/2020   ALKPHOS 83 01/06/2020   BILITOT 0.3 01/06/2020   Lab Results  Component Value Date   HGBA1C 5.4 06/29/2019   HGBA1C 5.4 10/30/2018   HGBA1C 5.5 01/16/2018   HGBA1C 5.4 10/03/2017   HGBA1C 5.3 05/01/2017   Lab Results  Component Value Date   INSULIN 12.3 01/06/2020   INSULIN 17.7 06/29/2019   INSULIN 29.3 (H) 10/30/2018   INSULIN 12.9 01/16/2018   INSULIN 21.7 10/03/2017   Lab Results  Component Value Date   TSH 1.640 05/01/2017   Lab Results  Component Value Date   CHOL 190 01/06/2020   HDL 45 01/06/2020   LDLCALC 130 (H) 01/06/2020   TRIG 84 01/06/2020   CHOLHDL 3.4 01/09/2012   Lab Results  Component Value Date   WBC 7.7 01/06/2020   HGB 14.9 01/06/2020   HCT 45.0 01/06/2020   MCV 89 01/06/2020   PLT 308 01/06/2020   Attestation Statements:   Reviewed by clinician on day of visit: allergies, medications, problem list, medical history, surgical history, family history, social history, and previous encounter notes.  I, 01/08/2020, CMA, am acting as transcriptionist for Insurance claims handler, DO  I have reviewed the above documentation for accuracy and completeness, and I agree with the above. Helane Rima, DO

## 2020-03-08 MED ORDER — FLUOXETINE HCL 10 MG PO CAPS: 30.0000 mg | ORAL_CAPSULE | Freq: Every day | ORAL | 0 refills | Status: DC

## 2020-03-08 MED ORDER — LISDEXAMFETAMINE DIMESYLATE 20 MG PO CAPS: 20.0000 mg | ORAL_CAPSULE | Freq: Every day | ORAL | 0 refills | Status: DC

## 2020-03-15 ENCOUNTER — Encounter: Payer: Self-pay | Admitting: Neurology

## 2020-03-15 ENCOUNTER — Ambulatory Visit (INDEPENDENT_AMBULATORY_CARE_PROVIDER_SITE_OTHER): Payer: BC Managed Care – PPO | Admitting: Neurology

## 2020-03-15 ENCOUNTER — Telehealth: Payer: Self-pay | Admitting: Neurology

## 2020-03-15 VITALS — BP 141/92 | HR 102 | Ht 62.0 in | Wt 261.0 lb

## 2020-03-15 DIAGNOSIS — F418 Other specified anxiety disorders: Secondary | ICD-10-CM

## 2020-03-15 DIAGNOSIS — Z820 Family history of epilepsy and other diseases of the nervous system: Secondary | ICD-10-CM

## 2020-03-15 DIAGNOSIS — M79604 Pain in right leg: Secondary | ICD-10-CM | POA: Diagnosis not present

## 2020-03-15 DIAGNOSIS — M79641 Pain in right hand: Secondary | ICD-10-CM

## 2020-03-15 DIAGNOSIS — R200 Anesthesia of skin: Secondary | ICD-10-CM | POA: Diagnosis not present

## 2020-03-15 NOTE — Progress Notes (Signed)
[facility]  PATIENT: Cynthia [name] DOB: 04/25/1986  REFERRING DOCTOR OR PCP: Helane Rima and Beverley Fiedler, PCP; Dr. Ethelene Hal (referring, Ortho Pain) SOURCE: Patient, notes from PCP, notes from Dr. Ethelene Hal, laboratory and imaging reports, MRI images personally reviewed.  _________________________________   HISTORICAL  CHIEF COMPLAINT:  Chief Complaint  Patient presents with  . New Patient (Initial Visit)    RM 12, alone. Paper referral from Dr. Ethelene Hal for hand numbness. Last [facility] 10/2019 by Dr. Ethelene Hal was ineffective. Usually they will help for about a year.     HISTORY OF PRESENT ILLNESS:  I had the pleasure of seeing patient, Cynthia [name], at [facility] for neurologic consultation regarding her right hand numbness and other symptoms.  She is a 34 year old woman who has neck ain, back pain and numbness in the right hand.   She has tingling and a numb sensation, mostly at night, in the 2nd, 3rd and 4th fingers (dorsum and palm).    She has noted some decreased vision OD.    She has seen ophthalmology Blima Ledger, OD)and was told the border of the optic disc was fuzzy.      She has had surgery on both knees.   She has a long history of lower back pain as well.   At times she gets tingling in the legs.   She had an episode of a pinching pain in the right leg/back of thigh  She had been on Celebrex for a year and she noted neck pain and other pain was better while on it.  In the past ESI's have helped but the most recent one, this spring, did not help.    She has not had an NCV/EMG.    She saw a rheumatologist in the past and serologic evaluation was negative.   She was told she might have fibromyalgia.  She has anxiety > depression and is on fluoxetine 60 mg daily.   She was just prescribed Buspar but has not started.    She used to have migraine headaches and Imitrex helped.    She is currently seeing the medical weight loss center for  morbid obesity.  The BMI is 47.  She is on Saxenda.  Her mother has MS and lumbar degenerative changes.   Lab work 01/06/2020 shows a slightly reduced vitamin D at 27, mildly elevated ALT and mildly elevated LDL.  06/29/2019 labs show hemoglobin A1c was normal at 5.4 and B12 was normal.  REVIEW OF SYSTEMS: Constitutional: No fevers, chills, sweats, or change in appetite Eyes: No visual changes, double vision, eye pain Ear, nose and throat: No hearing loss, ear pain, nasal congestion, sore throat Cardiovascular: No chest pain, palpitations Respiratory: No shortness of breath at rest or with exertion.   No wheezes GastrointestinaI: No nausea, vomiting, diarrhea, abdominal pain, fecal incontinence Genitourinary: No dysuria, urinary retention or frequency.  No nocturia. Musculoskeletal:As above Integumentary: No rash, pruritus, skin lesions Neurological: as above Psychiatric: She has depression and anxiety. Endocrine: No palpitations, diaphoresis, change in appetite, change in weigh or increased thirst Hematologic/Lymphatic: No anemia, purpura, petechiae. Allergic/Immunologic: No itchy/runny eyes, nasal congestion, recent allergic reactions, rashes  ALLERGIES: Allergies  Allergen Reactions  . Aquacel [Carboxymethylcellulose] Itching, Rash and Other (See Comments)    Caused blisters  . Penicillins     Unknown reaction Has patient had a PCN reaction causing immediate rash, facial/tongue/throat swelling, SOB or lightheadedness with hypotension: Unknown Has patient had a PCN reaction causing severe  rash involving mucus membranes or skin necrosis: Unknown Has patient had a PCN reaction that required hospitalization: No Has patient had a PCN reaction occurring within the last 10 years: No If all of the above answers are "NO", then may proceed with Cephalosporin use.   . Adhesive [Tape] Rash    Prefers to use paper tape  . Latex Rash  . Oxycodone Itching and Rash    HOME  MEDICATIONS:  Current Outpatient Medications:  .  acetaminophen (TYLENOL) 500 MG tablet, Take 1,000 mg by mouth 3 (three) times daily as needed for moderate pain. , Disp: , Rfl:  .  albuterol (PROAIR HFA) 108 (90 Base) MCG/ACT inhaler, Inhale 1-2 puffs into the lungs every 6 (six) hours as needed for wheezing or shortness of breath., Disp: 18 g, Rfl: 1 .  alum hydroxide-mag trisilicate (GAVISCON) 80-20 MG CHEW chewable tablet, Chew 1-2 tablets by mouth daily as needed for indigestion or heartburn., Disp: , Rfl:  .  busPIRone (BUSPAR) 7.5 MG tablet, Take 1 tablet (7.5 mg total) by mouth 3 (three) times daily. (Patient not taking: Reported on 03/07/2020), Disp: 90 tablet, Rfl: 0 .  Carbinoxamine Maleate 4 MG TABS, , Disp: , Rfl:  .  fexofenadine (ALLEGRA ALLERGY) 180 MG tablet, Allegra Allergy, Disp: , Rfl:  .  FLUoxetine (PROZAC) 10 MG capsule, Take 3 capsules (30 mg total) by mouth daily., Disp: 90 capsule, Rfl: 0 .  Fluticasone Furoate (ARNUITY ELLIPTA) 200 MCG/ACT AEPB, Inhale 1 puff into the lungs daily., Disp: 30 each, Rfl: 3 .  HYDROcodone-acetaminophen (NORCO) 10-325 MG tablet, Take 1 tablet by mouth every 6 (six) hours as needed., Disp: , Rfl:  .  hyoscyamine (LEVSIN SL) 0.125 MG SL tablet, Take 0.125 mg by mouth 3 (three) times daily with meals. , Disp: , Rfl: 0 .  Insulin Pen Needle (BD PEN NEEDLE NANO U/F) 32G X 4 MM MISC, 1 Device by Does not apply route daily., Disp: 100 each, Rfl: 0 .  levocetirizine (XYZAL) 5 MG tablet, TAKE 1 TABLET(5 MG) BY MOUTH EVERY EVENING, Disp: 90 tablet, Rfl: 1 .  liraglutide (VICTOZA) 18 MG/3ML SOPN, Inject 0.2 mLs (1.2 mg total) into the skin every morning. (Patient not taking: Reported on 03/07/2020), Disp: 2 pen, Rfl: 0 .  Liraglutide -Weight Management (SAXENDA) 18 MG/3ML SOPN, Inject 0.5 mLs (3 mg total) into the skin daily., Disp: 5 pen, Rfl: 0 .  lisdexamfetamine (VYVANSE) 20 MG capsule, Take 1 capsule (20 mg total) by mouth daily., Disp: 30 capsule,  Rfl: 0 .  methocarbamol (ROBAXIN) 500 MG tablet, Take 1 tablet (500 mg total) by mouth 2 (two) times daily as needed for muscle spasms., Disp: 60 tablet, Rfl: 0 .  montelukast (SINGULAIR) 10 MG tablet, TAKE 1 TABLET(10 MG) BY MOUTH AT BEDTIME, Disp: 90 tablet, Rfl: 1 .  norethindrone-ethinyl estradiol (MICROGESTIN FE 1/20) 1-20 MG-MCG tablet, Take 1 tablet by mouth daily. (Patient taking differently: Take 1 tablet by mouth at bedtime. ), Disp: 3 Package, Rfl: 4 .  olopatadine (PATANOL) 0.1 % ophthalmic solution, Place 1 drop into both eyes 2 (two) times daily., Disp: 5 mL, Rfl: 5 .  QNASL 80 MCG/ACT AERS, USE 1 SPRAY IN EACH NOSTRIL TWICE DAILY AS NEEDED FOR ALLERGIES, Disp: 10.6 g, Rfl: 5 .  traMADol (ULTRAM) 50 MG tablet, Take by mouth every 6 (six) hours as needed., Disp: , Rfl:  .  traZODone (DESYREL) 100 MG tablet, Take 100 mg by mouth at bedtime. , Disp: , Rfl:  .  Vitamin D, Ergocalciferol, (DRISDOL) 1.25 MG (50000 UNIT) CAPS capsule, Take 1 capsule (50,000 Units total) by mouth every 7 (seven) days., Disp: 4 capsule, Rfl: 0  PAST MEDICAL HISTORY: Past Medical History:  Diagnosis Date  . ADHD (attention deficit hyperactivity disorder)   . Allergic rhinitis   . Anxiety   . Asthma   . Back injury    COMPETITIVE INJURY IN HIGH SCHOOL  . Back pain   . Depression   . Fatty liver   . Fibromyalgia   . Gallbladder problem   . GERD (gastroesophageal reflux disease)   . IBS (irritable bowel syndrome)   . Insulin resistance   . Joint pain   . Migraines   . OA (osteoarthritis) of knee   . Obesity   . Pneumonia   . PONV (postoperative nausea and vomiting)   . Seasonal allergies   . Vitamin D deficiency     PAST SURGICAL HISTORY: Past Surgical History:  Procedure Laterality Date  . CHOLECYSTECTOMY    . DG DILATION URETERS    . KNEE ARTHROSCOPY Left   . KNEE ARTHROSCOPY WITH LATERAL MENISECTOMY Right 04/11/2018   Procedure: RIGHT KNEE ARTHROSCOPY WITH PARTIAL MENISECTOMY  CHONDROPLASTY;  Surgeon: Eugenia Mcalpine, MD;  Location: [facility];  Service: Orthopedics;  Laterality: Right;  . MOUTH SURGERY    . SHOULDER SURGERY  2002   RIGHT  . WISDOM TOOTH EXTRACTION    . WRIST ARTHROSCOPY     Right    FAMILY HISTORY: Family History  Problem Relation Age of Onset  . Multiple sclerosis Mother   . Diabetes Mother   . Hyperlipidemia Mother   . Thyroid disease Mother   . Depression Mother   . Anxiety disorder Mother   . Obesity Mother   . Allergic rhinitis Mother   . Hypertension Father   . Diabetes Father   . Hyperlipidemia Father   . Obesity Father   . Allergic rhinitis Father   . Diabetes Maternal Grandfather   . Cancer Maternal Grandfather        prostate  . Heart disease Maternal Grandfather   . Heart disease Paternal Grandfather   . Allergic rhinitis Paternal Aunt   . Allergic rhinitis Maternal Grandmother   . Asthma Neg Hx   . Eczema Neg Hx   . Urticaria Neg Hx     SOCIAL HISTORY:  Social History   Socioeconomic History  . Marital status: Single    Spouse name: Not on file  . Number of children: Not on file  . Years of education: Not on file  . Highest education level: Not on file  Occupational History  . Occupation: Asst. Social research officer, government, [employer]  Tobacco Use  . Smoking status: Never Smoker  . Smokeless tobacco: Never Used  Vaping Use  . Vaping Use: Never used  Substance and Sexual Activity  . Alcohol use: Yes    Comment: social  . Drug use: No  . Sexual activity: Not Currently    Birth control/protection: Pill  Other Topics Concern  . Not on file  Social History Narrative   Lives   Caffeine use:    Right handed   Social Determinants of Health   Financial Resource Strain:   . Difficulty of Paying Living Expenses: Not on file  Food Insecurity:   . Worried About Programme researcher, broadcasting/film/video in the Last Year: Not on file  . Ran Out of Food in the Last Year: Not on file  Transportation Needs:   .  Lack of Transportation  (Medical): Not on file  . Lack of Transportation (Non-Medical): Not on file  Physical Activity:   . Days of Exercise per Week: Not on file  . Minutes of Exercise per Session: Not on file  Stress:   . Feeling of Stress : Not on file  Social Connections:   . Frequency of Communication with Friends and Family: Not on file  . Frequency of Social Gatherings with Friends and Family: Not on file  . Attends Religious Services: Not on file  . Active Member of Clubs or Organizations: Not on file  . Attends Banker Meetings: Not on file  . Marital Status: Not on file  Intimate Partner Violence:   . Fear of Current or Ex-Partner: Not on file  . Emotionally Abused: Not on file  . Physically Abused: Not on file  . Sexually Abused: Not on file     PHYSICAL EXAM  Vitals:   03/15/20 1131  BP: (!) 141/92  Pulse: (!) 102  Weight: 261 lb (118.4 kg)  Height: 5\' 2"  (1.575 m)    Body mass index is 47.74 kg/m.   General: The patient is well-developed and well-nourished and in no acute distress  HEENT:  Head is [place]/AT.  Sclera are anicteric.  Funduscopic exam shows normal optic discs and retinal vessels.  Neck: No carotid bruits are noted.  The neck is nontender.  Range of motion is normal  Cardiovascular: The heart has a regular rate and rhythm with a normal S1 and S2. There were no murmurs, gallops or rubs.    Skin: Extremities are without rash or  edema.  Musculoskeletal:  Back is mildly tender  Neurologic Exam  Mental status: The patient is alert and oriented x 3 at the time of the examination. The patient has apparent normal recent and remote memory, with an apparently normal attention span and concentration ability.   Speech is normal.  Cranial nerves: Extraocular movements are full. Pupils are equal, round, and reactive to light and accomodation.  She reports very mild reduced color vision OS.  Facial symmetry is present. There is good facial sensation to soft touch  bilaterally.Facial strength is normal.  Trapezius and sternocleidomastoid strength is normal. No dysarthria is noted.  The tongue is midline, and the patient has symmetric elevation of the soft palate. No obvious hearing deficits are noted.  Motor:  Muscle bulk is normal.   Tone is normal. Strength is  5 / 5 in all 4 extremities.   Sensory: She has a Phalen sign entering the right hand.  The Tinel signs are negative.  Sensory testing is intact to pinprick, soft touch and vibration sensation in all 4 extremities.  Coordination: Cerebellar testing reveals good finger-nose-finger and heel-to-shin bilaterally.  Gait and station: Station is normal.   Gait is normal. Tandem gait is normal. Romberg is negative.   Reflexes: Deep tendon reflexes are symmetric and normal bilaterally.   Plantar responses are flexor.    DIAGNOSTIC DATA (LABS, IMAGING, TESTING) - I reviewed patient records, labs, notes, testing and imaging myself where available.  Lab Results  Component Value Date   WBC 7.7 01/06/2020   HGB 14.9 01/06/2020   HCT 45.0 01/06/2020   MCV 89 01/06/2020   PLT 308 01/06/2020      Component Value Date/Time   NA 139 01/06/2020 1609   K 4.2 01/06/2020 1609   CL 105 01/06/2020 1609   CO2 19 (L) 01/06/2020 1609   GLUCOSE 84 01/06/2020  1609   GLUCOSE 87 04/08/2018 1420   BUN 11 01/06/2020 1609   CREATININE 0.94 01/06/2020 1609   CALCIUM 9.2 01/06/2020 1609   PROT 7.1 01/06/2020 1609   ALBUMIN 4.2 01/06/2020 1609   AST 27 01/06/2020 1609   ALT 44 (H) 01/06/2020 1609   ALKPHOS 83 01/06/2020 1609   BILITOT 0.3 01/06/2020 1609   GFRNONAA 80 01/06/2020 1609   GFRAA 92 01/06/2020 1609   Lab Results  Component Value Date   CHOL 190 01/06/2020   HDL 45 01/06/2020   LDLCALC 130 (H) 01/06/2020   TRIG 84 01/06/2020   CHOLHDL 3.4 01/09/2012   Lab Results  Component Value Date   HGBA1C 5.4 06/29/2019   Lab Results  Component Value Date   VITAMINB12 426 06/29/2019   Lab Results   Component Value Date   TSH 1.640 05/01/2017       ASSESSMENT AND PLAN  Numbness - Plan: NCV with EMG(electromyography), MR BRAIN W WO CONTRAST  Right hand pain - Plan: NCV with EMG(electromyography)  Right leg pain - Plan: NCV with EMG(electromyography)  Family history of MS (multiple sclerosis) - Plan: MR BRAIN W WO CONTRAST  Depression with anxiety   In summary, Cynthia [name] is a 34 year old woman with right hand numbness, neck pain, back pain and right leg pain and numbness.  The MRI of the cervical spine showed minimal degenerative changes.  The spinal cord was normal.  Due to the numbness and her family history of MS we will check an MRI of the brain.  Additionally, I will check an NCV/EMG study to determine if she has a mononeuropathy such as carpal tunnel syndrome or radiculopathy that could better explain the symptoms in the right arm and hand and leg.      We discussed considering changing the fluoxetine to duloxetine as it may help pain better.  She will discuss this with her prescribing physician.  I will see her when she returns for the NCV/EMG study.  She should call sooner if she has any significant new or worsening neurologic symptoms.Pearletha Furl. Epimenio Foot, MD, [facility] 03/15/2020, 11:40 AM Certified in Neurology, Clinical Neurophysiology, Sleep Medicine and Neuroimaging  [facility] [address], [address] [place], [place] [address] [phone]

## 2020-03-15 NOTE — Telephone Encounter (Signed)
LVM for pt to call back about scheduling mri  [employer] Auth: [id number] (exp. 03/15/20 to 09/10/20)

## 2020-03-16 DIAGNOSIS — F4323 Adjustment disorder with mixed anxiety and depressed mood: Secondary | ICD-10-CM | POA: Diagnosis not present

## 2020-03-22 DIAGNOSIS — M25561 Pain in right knee: Secondary | ICD-10-CM | POA: Diagnosis not present

## 2020-03-22 DIAGNOSIS — M545 Low back pain: Secondary | ICD-10-CM | POA: Diagnosis not present

## 2020-03-22 DIAGNOSIS — M25562 Pain in left knee: Secondary | ICD-10-CM | POA: Diagnosis not present

## 2020-03-24 DIAGNOSIS — M25561 Pain in right knee: Secondary | ICD-10-CM | POA: Diagnosis not present

## 2020-03-24 DIAGNOSIS — M25562 Pain in left knee: Secondary | ICD-10-CM | POA: Diagnosis not present

## 2020-03-24 DIAGNOSIS — M545 Low back pain: Secondary | ICD-10-CM | POA: Diagnosis not present

## 2020-03-30 ENCOUNTER — Ambulatory Visit (INDEPENDENT_AMBULATORY_CARE_PROVIDER_SITE_OTHER): Payer: BC Managed Care – PPO | Admitting: Family Medicine

## 2020-03-30 ENCOUNTER — Encounter (INDEPENDENT_AMBULATORY_CARE_PROVIDER_SITE_OTHER): Payer: Self-pay | Admitting: Family Medicine

## 2020-03-30 ENCOUNTER — Other Ambulatory Visit: Payer: Self-pay

## 2020-03-30 VITALS — BP 128/82 | HR 105 | Temp 98.0°F | Ht 62.0 in | Wt 259.0 lb

## 2020-03-30 DIAGNOSIS — M25562 Pain in left knee: Secondary | ICD-10-CM

## 2020-03-30 DIAGNOSIS — G8929 Other chronic pain: Secondary | ICD-10-CM

## 2020-03-30 DIAGNOSIS — F411 Generalized anxiety disorder: Secondary | ICD-10-CM

## 2020-03-30 DIAGNOSIS — M25561 Pain in right knee: Secondary | ICD-10-CM | POA: Diagnosis not present

## 2020-03-30 DIAGNOSIS — Z6841 Body Mass Index (BMI) 40.0 and over, adult: Secondary | ICD-10-CM

## 2020-03-31 ENCOUNTER — Ambulatory Visit: Payer: BC Managed Care – PPO | Admitting: Allergy & Immunology

## 2020-04-01 DIAGNOSIS — F4323 Adjustment disorder with mixed anxiety and depressed mood: Secondary | ICD-10-CM | POA: Diagnosis not present

## 2020-04-03 NOTE — Progress Notes (Signed)
Chief Complaint:   OBESITY Cynthia [name] is here to discuss her progress with her obesity treatment plan along with follow-up of her obesity related diagnoses. Cynthia [name] is on keeping a food journal and adhering to recommended goals of 1400 calories and 95 protein and states she is following her eating plan approximately 90% of the time. Cynthia [name] states she is doing aquatic therapy 60 minutes 1 time per week.  Total lbs lost since last in-office visit: 1  Interim History: Cynthia [name] has been applying for jobs. She is working on maintaining a regular schedule, focusing on waking and eating at routine times. She is still struggling to get all protein. She is enjoying her new puppy, keeping her more active. Unfortunately, her knee pain has worsened. She will be seeing her Orthopedist soon.  Assessment/Plan:   1. GAD (generalized anxiety disorder) with emotional eating Stable. Cynthia [name] has found that the new puppy has helped her greatly. Behavior modification techniques were discussed today to help Cynthia [name] deal with her emotional/non-hunger eating behaviors.    2. Chronic pain of both knees Worsened. Will continue to monitor symptoms as they relate to her weight loss journey. This issue directly impacts care plan for optimization of BMI and metabolic health as it impacts the patient's ability to make lifestyle changes.  3. Class 3 severe obesity with serious comorbidity and body mass index (BMI) of 45.0 to 49.9 in adult, unspecified obesity type Cynthia [name])  Cynthia [name] is currently in the action stage of change. As such, her goal is to continue with weight loss efforts. She has agreed to keeping a food journal and adhering to recommended goals of 1400 calories and 95 protein.   Exercise goals: For substantial health benefits, adults should do at least 150 minutes (2 hours and 30 minutes) a week of moderate-intensity, or 75 minutes (1 hour and 15 minutes) a week of vigorous-intensity aerobic physical activity, or an equivalent  combination of moderate- and vigorous-intensity aerobic activity. Aerobic activity should be performed in episodes of at least 10 minutes, and preferably, it should be spread throughout the week. Adults should also include muscle-strengthening activities that involve all major muscle groups on 2 or more days a week.  Behavioral modification strategies: increasing lean protein intake.  Cynthia [name] has agreed to follow-up with our clinic in 3 weeks. She was informed of the importance of frequent follow-up visits to maximize her success with intensive lifestyle modifications for her multiple health conditions.   Objective:   Blood pressure 128/82, pulse (!) 105, temperature 98 F (36.7 C), temperature source Oral, height 5\' 2"  (1.575 m), weight 259 lb (117.5 kg), SpO2 94 %. Body mass index is 47.37 kg/m.  General: Cooperative, alert, well developed, in no acute distress. HEENT: Conjunctivae and lids unremarkable. Cardiovascular: Regular rhythm.  Lungs: Normal work of breathing. Neurologic: No focal deficits.   Lab Results  Component Value Date   CREATININE 0.94 01/06/2020   BUN 11 01/06/2020   NA 139 01/06/2020   K 4.2 01/06/2020   CL 105 01/06/2020   CO2 19 (L) 01/06/2020   Lab Results  Component Value Date   ALT 44 (H) 01/06/2020   AST 27 01/06/2020   ALKPHOS 83 01/06/2020   BILITOT 0.3 01/06/2020   Lab Results  Component Value Date   HGBA1C 5.4 06/29/2019   HGBA1C 5.4 10/30/2018   HGBA1C 5.5 01/16/2018   HGBA1C 5.4 10/03/2017   HGBA1C 5.3 05/01/2017   Lab Results  Component Value Date   INSULIN 12.3 01/06/2020  INSULIN 17.7 06/29/2019   INSULIN 29.3 (H) 10/30/2018   INSULIN 12.9 01/16/2018   INSULIN 21.7 10/03/2017   Lab Results  Component Value Date   TSH 1.640 05/01/2017   Lab Results  Component Value Date   CHOL 190 01/06/2020   HDL 45 01/06/2020   LDLCALC 130 (H) 01/06/2020   TRIG 84 01/06/2020   CHOLHDL 3.4 01/09/2012   Lab Results  Component Value Date    WBC 7.7 01/06/2020   HGB 14.9 01/06/2020   HCT 45.0 01/06/2020   MCV 89 01/06/2020   PLT 308 01/06/2020   Attestation Statements:   Reviewed by clinician on day of visit: allergies, medications, problem list, medical history, surgical history, family history, social history, and previous encounter notes.  Time spent on visit including pre-visit chart review and post-visit care and charting was 30 minutes.

## 2020-04-04 ENCOUNTER — Encounter (INDEPENDENT_AMBULATORY_CARE_PROVIDER_SITE_OTHER): Payer: Self-pay | Admitting: Family Medicine

## 2020-04-08 DIAGNOSIS — M545 Low back pain: Secondary | ICD-10-CM | POA: Diagnosis not present

## 2020-04-08 DIAGNOSIS — M25561 Pain in right knee: Secondary | ICD-10-CM | POA: Diagnosis not present

## 2020-04-08 DIAGNOSIS — M25562 Pain in left knee: Secondary | ICD-10-CM | POA: Diagnosis not present

## 2020-04-12 DIAGNOSIS — Z23 Encounter for immunization: Secondary | ICD-10-CM | POA: Diagnosis not present

## 2020-04-13 DIAGNOSIS — M170 Bilateral primary osteoarthritis of knee: Secondary | ICD-10-CM | POA: Diagnosis not present

## 2020-04-20 ENCOUNTER — Ambulatory Visit (INDEPENDENT_AMBULATORY_CARE_PROVIDER_SITE_OTHER): Payer: BC Managed Care – PPO | Admitting: Family Medicine

## 2020-04-20 DIAGNOSIS — F4323 Adjustment disorder with mixed anxiety and depressed mood: Secondary | ICD-10-CM | POA: Diagnosis not present

## 2020-04-24 ENCOUNTER — Other Ambulatory Visit (INDEPENDENT_AMBULATORY_CARE_PROVIDER_SITE_OTHER): Payer: Self-pay | Admitting: Family Medicine

## 2020-04-24 DIAGNOSIS — E559 Vitamin D deficiency, unspecified: Secondary | ICD-10-CM

## 2020-04-25 ENCOUNTER — Other Ambulatory Visit: Payer: Self-pay

## 2020-04-25 ENCOUNTER — Ambulatory Visit (INDEPENDENT_AMBULATORY_CARE_PROVIDER_SITE_OTHER): Payer: BC Managed Care – PPO | Admitting: Family Medicine

## 2020-04-25 ENCOUNTER — Encounter (INDEPENDENT_AMBULATORY_CARE_PROVIDER_SITE_OTHER): Payer: Self-pay | Admitting: Family Medicine

## 2020-04-25 VITALS — BP 126/84 | HR 112 | Temp 98.5°F | Ht 62.0 in | Wt 255.0 lb

## 2020-04-25 DIAGNOSIS — E559 Vitamin D deficiency, unspecified: Secondary | ICD-10-CM

## 2020-04-25 DIAGNOSIS — Z9189 Other specified personal risk factors, not elsewhere classified: Secondary | ICD-10-CM

## 2020-04-25 DIAGNOSIS — F5081 Binge eating disorder: Secondary | ICD-10-CM | POA: Diagnosis not present

## 2020-04-25 DIAGNOSIS — F411 Generalized anxiety disorder: Secondary | ICD-10-CM | POA: Diagnosis not present

## 2020-04-25 DIAGNOSIS — R632 Polyphagia: Secondary | ICD-10-CM | POA: Diagnosis not present

## 2020-04-25 DIAGNOSIS — Z6841 Body Mass Index (BMI) 40.0 and over, adult: Secondary | ICD-10-CM

## 2020-04-25 MED ORDER — FLUOXETINE HCL 10 MG PO CAPS: 30.0000 mg | ORAL_CAPSULE | Freq: Every day | ORAL | 0 refills | Status: DC

## 2020-04-25 MED ORDER — SAXENDA 18 MG/3ML ~~LOC~~ SOPN: 3.0000 mg | PEN_INJECTOR | Freq: Every day | SUBCUTANEOUS | 0 refills | Status: DC

## 2020-04-25 MED ORDER — VITAMIN D (ERGOCALCIFEROL) 1.25 MG (50000 UNIT) PO CAPS: 50000.0000 [IU] | ORAL_CAPSULE | ORAL | 0 refills | Status: DC

## 2020-04-25 MED ORDER — LISDEXAMFETAMINE DIMESYLATE 20 MG PO CAPS: 20.0000 mg | ORAL_CAPSULE | Freq: Every day | ORAL | 0 refills | Status: DC

## 2020-04-25 NOTE — Progress Notes (Signed)
Chief Complaint:   OBESITY Cynthia [name] is here to discuss her progress with her obesity treatment plan along with follow-up of her obesity related diagnoses. Cynthia [name] is on keeping a food journal and adhering to recommended goals of 1400 calories and 95 grams of protein and states she is following her eating plan approximately 60% of the time. Cynthia [name] states she is exercising for 0 minutes 0 times per week.  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  She works from 10-4 (~30 hours per week) for 3 weeks.  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  She says she feels bloated and has PMS.  She has an MRI scheduled on 04/29/2020 of her knees.  She is followed by Ortho.  She may have a torn meniscus in her left knee.  She has a nerve conduction study scheduled with Dr. Ethelene Hal.  Assessment/Plan:   1. Vitamin D deficiency Current vitamin D is 27.6, tested on 01/06/2020. Not at goal. Optimal goal > 50 ng/dL. There is also evidence to support a goal of >70 ng/dL in patients with cancer and heart disease. Plan: Continue Vitamin D @50 ,000 IU every week with follow-up for routine testing of Vitamin D at least 2-3 times per year to avoid over-replacement.  -Refill Vitamin D, Ergocalciferol, (DRISDOL) 1.25 MG (50000 UNIT) CAPS capsule; Take 1 capsule (50,000 Units total) by mouth every 7 (seven) days.  Dispense: 4 capsule; Refill: 0  2. Polyphagia Hyperphagia, also called polyphagia, refers to excessive feelings of hunger, which are not relieved by eating. This is more likely to be an issues for people that have diabetes, prediabetes, or insulin resistance.   3. Binge eating disorder, ADHD Cynthia [name] meets binge eating disorder (BED) requirements, including: binging  4-13 times a week , eating until feeling uncomfortably full and eating large amounts of food when not feeling physically full. Food choices and timing of food intake reviewed today.  -Refill lisdexamfetamine (VYVANSE) 20 MG capsule; Take 1 capsule (20 mg total) by mouth daily.  Dispense: 30 capsule; Refill: 0  I have consulted the  Controlled Substances Registry for this patient, and feel the risk/benefit ratio today is favorable for proceeding with this prescription for a controlled substance. The patient understands monitoring parameters and red flags.  4. GAD (generalized anxiety disorder) with emotional eating Improved. Behavior modification techniques were discussed today to help Cynthia [name] deal with her anxiety.  Will continue to monitor symptoms as they relate to her weight loss journey.  -Refill FLUoxetine (PROZAC) 10 MG capsule; Take 3 capsules (30 mg total) by mouth daily.  Dispense: 90 capsule; Refill: 0  5. At risk for constipation Cynthia [name] was given approximately 15 minutes of counseling today regarding prevention of constipation.   Counseling Getting to Good Bowel Health: Your goal is to have one soft bowel movement each day. Drink at least 8 glasses of water each day. Eat plenty of fiber (goal is over 25 grams each day). It is best to get most of your fiber from dietary sources which includes leafy green vegetables, fresh fruit, and whole grains. You may need to add fiber with the help of OTC fiber supplements. These include Metamucil, Citrucel, and Benefiber. If you are still having trouble, try adding Miralax or Magnesium  Citrate. If all of these changes do not work, contact me.  6. Class 3 severe obesity with serious comorbidity and body mass index (BMI) of 45.0 to 49.9 in adult, unspecified obesity type (HCC) The current medical regimen is effective;  continue present plan and medications.  -Refill Liraglutide -Weight Management (SAXENDA) 18 MG/3ML SOPN; Inject 3 mg into the skin  daily.  Dispense: 15 mL; Refill: 0  Cynthia [name] is currently in the action stage of change. As such, her goal is to continue with weight loss efforts. She has agreed to keeping a food journal and adhering to recommended goals of 1400 calories and 95 grams of protein.   Exercise goals: As tolerated.  Behavioral modification strategies: increasing lean protein intake, better snacking choices and decreasing junk food.  Cynthia [name] has agreed to follow-up with our clinic in 3 weeks. She was informed of the importance of frequent follow-up visits to maximize her success with intensive lifestyle modifications for her multiple health conditions.   Objective:   Blood pressure 126/84, pulse (!) 112, temperature 98.5 F (36.9 C), temperature source Oral, height 5\' 2"  (1.575 m), weight 255 lb (115.7 kg), SpO2 94 %. Body mass index is 46.64 kg/m.  General: Cooperative, alert, well developed, in no acute distress. HEENT: Conjunctivae and lids unremarkable. Cardiovascular: Regular rhythm.  Lungs: Normal work of breathing. Neurologic: No focal deficits.   Lab Results  Component Value Date   CREATININE 0.94 01/06/2020   BUN 11 01/06/2020   NA 139 01/06/2020   K 4.2 01/06/2020   CL 105 01/06/2020   CO2 19 (L) 01/06/2020   Lab Results  Component Value Date   ALT 44 (H) 01/06/2020   AST 27 01/06/2020   ALKPHOS 83 01/06/2020   BILITOT 0.3 01/06/2020   Lab Results  Component Value Date   HGBA1C 5.4 06/29/2019   HGBA1C 5.4 10/30/2018   HGBA1C 5.5 01/16/2018   HGBA1C 5.4 10/03/2017   HGBA1C 5.3 05/01/2017   Lab Results  Component Value Date   INSULIN 12.3 01/06/2020   INSULIN 17.7 06/29/2019   INSULIN 29.3 (H) 10/30/2018   INSULIN 12.9 01/16/2018   INSULIN 21.7 10/03/2017   Lab Results  Component Value Date   TSH 1.640 05/01/2017   Lab Results  Component Value Date   CHOL 190 01/06/2020   HDL 45 01/06/2020   LDLCALC 130 (H) 01/06/2020   TRIG 84 01/06/2020   CHOLHDL 3.4 01/09/2012    Lab Results  Component Value Date   WBC 7.7 01/06/2020   HGB 14.9 01/06/2020   HCT 45.0 01/06/2020   MCV 89 01/06/2020   PLT 308 01/06/2020   Attestation Statements:   Reviewed by clinician on day of visit: allergies, medications, problem list, medical history, surgical history, family history, social history, and previous encounter notes.  I, 01/08/2020, CMA, am acting as transcriptionist for Insurance claims handler, DO  I have reviewed the above documentation for accuracy and completeness, and I agree with the above. Helane Rima, DO

## 2020-04-26 ENCOUNTER — Other Ambulatory Visit (INDEPENDENT_AMBULATORY_CARE_PROVIDER_SITE_OTHER): Payer: Self-pay | Admitting: Family Medicine

## 2020-04-26 DIAGNOSIS — Z6841 Body Mass Index (BMI) 40.0 and over, adult: Secondary | ICD-10-CM

## 2020-04-29 DIAGNOSIS — M25561 Pain in right knee: Secondary | ICD-10-CM | POA: Diagnosis not present

## 2020-04-29 DIAGNOSIS — M25562 Pain in left knee: Secondary | ICD-10-CM | POA: Diagnosis not present

## 2020-05-03 ENCOUNTER — Other Ambulatory Visit: Payer: Self-pay

## 2020-05-03 ENCOUNTER — Telehealth: Payer: Self-pay | Admitting: Neurology

## 2020-05-03 ENCOUNTER — Ambulatory Visit (INDEPENDENT_AMBULATORY_CARE_PROVIDER_SITE_OTHER): Payer: BC Managed Care – PPO | Admitting: Neurology

## 2020-05-03 ENCOUNTER — Encounter (INDEPENDENT_AMBULATORY_CARE_PROVIDER_SITE_OTHER): Payer: BC Managed Care – PPO | Admitting: Neurology

## 2020-05-03 DIAGNOSIS — R200 Anesthesia of skin: Secondary | ICD-10-CM | POA: Diagnosis not present

## 2020-05-03 DIAGNOSIS — M79604 Pain in right leg: Secondary | ICD-10-CM

## 2020-05-03 DIAGNOSIS — M79641 Pain in right hand: Secondary | ICD-10-CM

## 2020-05-03 DIAGNOSIS — M5416 Radiculopathy, lumbar region: Secondary | ICD-10-CM

## 2020-05-03 DIAGNOSIS — Z0289 Encounter for other administrative examinations: Secondary | ICD-10-CM

## 2020-05-03 MED ORDER — GABAPENTIN 300 MG PO CAPS: ORAL_CAPSULE | ORAL | 5 refills | Status: DC

## 2020-05-03 NOTE — Telephone Encounter (Signed)
no to the covid questions MR Brain w/wo contrast & MR Lumbar spine wo contrast Dr. Gershon Mussel Auth: [id number] (exp. 03/15/20 to 09/10/20 & [id number] (exp. 05/03/20 to 10/29/20). Patient is scheduled at [facility] 05/11/20.

## 2020-05-03 NOTE — Progress Notes (Signed)
Full Name: Ermine Stebbins Gender: Female MRN #: [id number] Date of Birth: 1986/02/11    Visit Date: 05/03/2020 08:08 Age: 34 Years Examining Physician: Despina Arias, MD  Referring Physician: Despina Arias, MD Height: 5 feet 2 inch    History: Ms. Laviolette is a 34 year old woman with pain and tingling in the arms and legs.  Exam showed normal strength.  She has a family history of MS.  Nerve conduction studies: The right median, ulnar, peroneal and tibial motor responses had normal distal latencies, amplitudes and conduction velocities.  The tibial and ulnar F-wave latencies were normal.   The right sural, superficial peroneal, median and ulnar sensory responses had normal peak latencies and amplitudes.  Electromyography: Needle EMG of selected muscles of the right arm and leg was performed.  Motor unit morphology and recruitment was normal in all of the muscles tested in the right arm.  In the right leg, some polyphasic motor units were noted in the right peroneus longus, tibialis anterior and gastrocnemius muscles.  Recruitment was reduced in the tibialis anterior muscle but normal in the other two.  There was no abnormal spontaneous activity in any of the muscles tested.   Impression: This NCV/EMG study shows the following: 1.   There is no evidence of polyneuropathy or mononeuropathy in the right arm and leg. 2.   Probable mild chronic L5 radiculopathy  Chenee Munns A. Epimenio Foot, MD, PhD, FAAN Certified in Neurology, Clinical Neurophysiology, Sleep Medicine, Pain Medicine and Neuroimaging Director, [facility]  [facility] [address] [place], [place] [address] [phone]   Verbal informed consent was obtained from the patient, patient was informed of potential risk of procedure, including bruising, bleeding, hematoma formation, infection, muscle weakness, muscle pain, numbness, among others.         MNC    Nerve / Sites Muscle Latency Ref. Amplitude Ref. Rel Amp Segments Distance Velocity Ref. Area    ms ms mV mV %  cm m/s m/s mVms  R Median - APB     Wrist APB 2.8 ?4.4 8.9 ?4.0 100 Wrist - APB 7   21.4     Upper arm APB 5.8  8.3  92.8 Upper arm - Wrist 20 65 ?49 22.2  R Ulnar - ADM     Wrist ADM 2.2 ?3.3 10.3 ?6.0 100 Wrist - ADM 7   26.2     B.Elbow ADM 4.3  9.1  88.6 B.Elbow - Wrist 15 71 ?49 25.5     A.Elbow ADM 5.5  9.0  98.6 A.Elbow - B.Elbow 8 71 ?49 25.5  R Peroneal - EDB     Ankle EDB 3.6 ?6.5 4.2 ?2.0 100 Ankle - EDB 9   9.9     Fib head EDB 8.2  3.8  88.4 Fib head - Ankle 24 52 ?44 9.6     Pop fossa EDB 9.8  3.4  89.5 Pop fossa - Fib head 8 52 ?44 9.0         Pop fossa - Ankle      R Tibial - AH     Ankle AH 3.4 ?5.8 15.6 ?4.0 100 Ankle - AH 9   29.8     Pop fossa AH 9.8  13.5  86.5 Pop fossa - Ankle 29 45 ?41 29.9             SNC    Nerve / Sites Rec. Site Peak Lat  Ref.  Amp Ref. Segments Distance Peak Diff Ref.    ms ms V V  cm ms ms  R Sural - Ankle (Calf)     Calf Ankle 3.3 ?4.4 6 ?6 Calf - Ankle 14    R Superficial peroneal - Ankle     Lat leg Ankle 3.4 ?4.4 8 ?6 Lat leg - Ankle 14    R Median, Ulnar - Transcarpal comparison     Median Palm Wrist 1.6 ?2.2 71 ?35 Median Palm - Wrist 8       Ulnar Palm Wrist 1.7 ?2.2 57 ?12 Ulnar Palm - Wrist 8          Median Palm - Ulnar Palm  -0.1 ?0.4  R Median - Orthodromic (Dig II, Mid palm)     Dig II Wrist 2.4 ?3.4 11 ?10 Dig II - Wrist 13    R Ulnar - Orthodromic, (Dig V, Mid palm)     Dig V Wrist 2.1 ?3.1 16 ?5 Dig V - Wrist 14                 F  Wave    Nerve F Lat Ref.   ms ms  R Tibial - AH 40.6 ?56.0  R Ulnar - ADM 22.3 ?32.0         EMG Summary Table    Spontaneous MUAP Recruitment  Muscle IA Fib PSW Fasc Other Amp Dur. Poly Pattern  R. Deltoid Normal None None None _______ Normal Normal Normal Normal  R. Triceps brachii Normal None None None _______ Normal Normal Normal Normal  R. Biceps brachii  Normal None None None _______ Normal Normal Normal Normal  R. Extensor digitorum communis Normal None None None _______ Normal Normal Normal Normal  R. Pronator teres Normal None None None _______ Normal Normal Normal Normal  R. Abductor pollicis brevis Normal None None None _______ Normal Normal Normal Normal  R. Vastus medialis Normal None None None _______ Normal Normal Normal Normal  R. Peroneus longus Normal None None None _______ Normal Normal 1+ Normal  R. Tibialis anterior Normal None None None _______ Normal Normal 1+ Reduced  R. Gastrocnemius (Medial head) Normal None None None _______ Normal Normal 1+ Normal  R. Abductor hallucis Normal None None None _______ Normal Normal Normal Normal  R. Gluteus medius Normal None None None _______ Normal Normal Normal Normal        [facility]  PATIENT: ALANNY RIVERS DOB: Aug 27, 1985  REFERRING DOCTOR OR PCP: Helane Rima and Beverley Fiedler, PCP; Dr. Ethelene Hal (referring, Ortho Pain) SOURCE: Patient, notes from PCP, notes from Dr. Ethelene Hal, laboratory and imaging reports, MRI images personally reviewed.  _________________________________   HISTORICAL  CHIEF COMPLAINT:  No chief complaint on file.   HISTORY OF PRESENT ILLNESS:  Aunya Lemler is a 34 year old woman with FH of MS who has neck ain, back pain and numbness in the right hand.     I reviewed the results of the NCV/EMG study performed today.  The NCV/EMG showed no mononeuropathies.   She had evidence of mild L5 +/- S1 chronic radiculopathy.    Symptoms are similar to her last visit.  She has tingling and a numb sensation, mostly at night, in the 2nd, 3rd and 4th fingers (dorsum and palm).   She has a long history of lower back pain as well.   The pain will radiate down the legs at times, right greater than left..   She has occasional episodes of  a pinching pain in the right leg/back of thigh.  In the past ESI's have helped but the most recent one, this  spring, did not help.    She has anxiety > depression and is on fluoxetine 60 mg daily.   We had discussed switching to duloxetine and she will discuss further with her psychologist.  She had been prescribed BuSpar but has not taken any yet. She used to have migraine headaches and Imitrex helped.    She is currently seeing the medical weight loss center for morbid obesity.  The BMI is 47.  She is on Saxenda and has lost 15 pounds.   DATA: MRI of the cervical spine showed minimal degenerative changes.  The spinal cord was normal.    Lab work 01/06/2020 shows a slightly reduced vitamin D at 27, mildly elevated ALT and mildly elevated LDL.  06/29/2019 labs show hemoglobin A1c was normal at 5.4 and B12 was normal.  She saw a rheumatologist in the past and serologic evaluation was negative.   She was told she might have fibromyalgia.    Her mother has MS and lumbar degenerative changes.   REVIEW OF SYSTEMS: Constitutional: No fevers, chills, sweats, or change in appetite Eyes: No visual changes, double vision, eye pain Ear, nose and throat: No hearing loss, ear pain, nasal congestion, sore throat Cardiovascular: No chest pain, palpitations Respiratory: No shortness of breath at rest or with exertion.   No wheezes GastrointestinaI: No nausea, vomiting, diarrhea, abdominal pain, fecal incontinence Genitourinary: No dysuria, urinary retention or frequency.  No nocturia. Musculoskeletal:As above Integumentary: No rash, pruritus, skin lesions Neurological: as above Psychiatric: She has depression and anxiety. Endocrine: No palpitations, diaphoresis, change in appetite, change in weigh or increased thirst Hematologic/Lymphatic: No anemia, purpura, petechiae. Allergic/Immunologic: No itchy/runny eyes, nasal congestion, recent allergic reactions, rashes  ALLERGIES: Allergies  Allergen Reactions  . Aquacel [Carboxymethylcellulose] Itching, Rash and Other (See Comments)    Caused blisters  .  Penicillins     Unknown reaction Has patient had a PCN reaction causing immediate rash, facial/tongue/throat swelling, SOB or lightheadedness with hypotension: Unknown Has patient had a PCN reaction causing severe rash involving mucus membranes or skin necrosis: Unknown Has patient had a PCN reaction that required hospitalization: No Has patient had a PCN reaction occurring within the last 10 years: No If all of the above answers are "NO", then may proceed with Cephalosporin use.   . Adhesive [Tape] Rash    Prefers to use paper tape  . Latex Rash  . Oxycodone Itching and Rash    HOME MEDICATIONS:  Current Outpatient Medications:  .  albuterol (PROAIR HFA) 108 (90 Base) MCG/ACT inhaler, Inhale 1-2 puffs into the lungs every 6 (six) hours as needed for wheezing or shortness of breath., Disp: 18 g, Rfl: 1 .  busPIRone (BUSPAR) 7.5 MG tablet, Take 1 tablet (7.5 mg total) by mouth 3 (three) times daily., Disp: 90 tablet, Rfl: 0 .  fexofenadine (ALLEGRA ALLERGY) 180 MG tablet, Allegra Allergy, Disp: , Rfl:  .  FLUoxetine (PROZAC) 10 MG capsule, Take 3 capsules (30 mg total) by mouth daily., Disp: 90 capsule, Rfl: 0 .  Fluticasone Furoate (ARNUITY ELLIPTA) 200 MCG/ACT AEPB, Inhale 1 puff into the lungs daily., Disp: 30 each, Rfl: 3 .  levocetirizine (XYZAL) 5 MG tablet, TAKE 1 TABLET(5 MG) BY MOUTH EVERY EVENING, Disp: 90 tablet, Rfl: 1 .  Liraglutide -Weight Management (SAXENDA) 18 MG/3ML SOPN, Inject 3 mg into the skin daily., Disp: 15 mL,  Rfl: 0 .  lisdexamfetamine (VYVANSE) 20 MG capsule, Take 1 capsule (20 mg total) by mouth daily., Disp: 30 capsule, Rfl: 0 .  methocarbamol (ROBAXIN) 500 MG tablet, Take 1 tablet (500 mg total) by mouth 2 (two) times daily as needed for muscle spasms., Disp: 60 tablet, Rfl: 0 .  montelukast (SINGULAIR) 10 MG tablet, TAKE 1 TABLET(10 MG) BY MOUTH AT BEDTIME, Disp: 90 tablet, Rfl: 1 .  norethindrone-ethinyl estradiol (AUROVELA 1/20) 1-20 MG-MCG tablet, Take 1  tablet by mouth daily., Disp: , Rfl:  .  traZODone (DESYREL) 100 MG tablet, Take 100 mg by mouth at bedtime. , Disp: , Rfl:  .  Vitamin D, Ergocalciferol, (DRISDOL) 1.25 MG (50000 UNIT) CAPS capsule, Take 1 capsule (50,000 Units total) by mouth every 7 (seven) days., Disp: 4 capsule, Rfl: 0  PAST MEDICAL HISTORY: Past Medical History:  Diagnosis Date  . ADHD (attention deficit hyperactivity disorder)   . Allergic rhinitis   . Anxiety   . Asthma   . Back injury    COMPETITIVE INJURY IN HIGH SCHOOL  . Back pain   . Depression   . Fatty liver   . Fibromyalgia   . Gallbladder problem   . GERD (gastroesophageal reflux disease)   . IBS (irritable bowel syndrome)   . Insulin resistance   . Joint pain   . Migraines   . OA (osteoarthritis) of knee   . Obesity   . Pneumonia   . PONV (postoperative nausea and vomiting)   . Seasonal allergies   . Vitamin D deficiency     PAST SURGICAL HISTORY: Past Surgical History:  Procedure Laterality Date  . CHOLECYSTECTOMY    . DG DILATION URETERS    . KNEE ARTHROSCOPY Left   . KNEE ARTHROSCOPY WITH LATERAL MENISECTOMY Right 04/11/2018   Procedure: RIGHT KNEE ARTHROSCOPY WITH PARTIAL MENISECTOMY CHONDROPLASTY;  Surgeon: Eugenia Mcalpine, MD;  Location: [facility];  Service: Orthopedics;  Laterality: Right;  . MOUTH SURGERY    . SHOULDER SURGERY  2002   RIGHT  . WISDOM TOOTH EXTRACTION    . WRIST ARTHROSCOPY     Right    FAMILY HISTORY: Family History  Problem Relation Age of Onset  . Multiple sclerosis Mother   . Diabetes Mother   . Hyperlipidemia Mother   . Thyroid disease Mother   . Depression Mother   . Anxiety disorder Mother   . Obesity Mother   . Allergic rhinitis Mother   . Hypertension Father   . Diabetes Father   . Hyperlipidemia Father   . Obesity Father   . Allergic rhinitis Father   . Diabetes Maternal Grandfather   . Cancer Maternal Grandfather        prostate  . Heart disease Maternal Grandfather   . Heart disease  Paternal Grandfather   . Allergic rhinitis Paternal Aunt   . Allergic rhinitis Maternal Grandmother   . Asthma Neg Hx   . Eczema Neg Hx   . Urticaria Neg Hx     PHYSICAL EXAM  There were no vitals filed for this visit.  There is no height or weight on file to calculate BMI.   General: The patient is well-developed and well-nourished and in no acute distress  HEENT:  Head is [place]/AT.  Sclera are anicteric.   Skin: Extremities are without rash or  edema.  Musculoskeletal:  Back is mildly tender.  Normal range of motion in neck.  Neurologic Exam  Mental status: The patient is alert and oriented x  3 at the time of the examination. The patient has apparent normal recent and remote memory, with an apparently normal attention span and concentration ability.   Speech is normal.  Cranial nerves: Extraocular movements are full.  Facial strength was normal.  Motor:  Muscle bulk is normal.   Tone is normal. Strength is  5 / 5 in all 4 extremities.   Sensory: Sensory testing is intact to pinprick, soft touch and vibration sensation in all 4 extremities.  Coordination: Cerebellar testing reveals good finger-nose-finger   Gait and station: Station is normal.   Gait is normal.   Reflexes: Deep tendon reflexes are symmetric and normal bilaterally.        ASSESSMENT AND PLAN  Numbness - Plan: NCV with EMG(electromyography)  Right hand pain - Plan: NCV with EMG(electromyography)  Right leg pain - Plan: NCV with EMG(electromyography)   1.   We discussed the results of the NCV/EMG study. 2.   She has a long history of back pain with right radicular pain intermittently.  Pain has increased this year.  We will check an MRI of the lumbar spine to further evaluate and consider referral for ESI based on the results. 3.    Gabapentin 300 mg in the morning, 300 g in the afternoon or evening and 600 mg at night.  She will titrate up to this dose. 4.    Return to see me as needed based on  results of the imaging studies and response to medication.  She should call if symptoms worsen  Roneisha Stern A. Epimenio FootSater, MD, [facility],FAAN 05/03/2020, 8:48 AM Certified in Neurology, Clinical Neurophysiology, Sleep Medicine and Neuroimaging  [facility] [address], [address] [place], [place] [id number] [phone]

## 2020-05-06 DIAGNOSIS — M170 Bilateral primary osteoarthritis of knee: Secondary | ICD-10-CM | POA: Diagnosis not present

## 2020-05-09 DIAGNOSIS — F4323 Adjustment disorder with mixed anxiety and depressed mood: Secondary | ICD-10-CM | POA: Diagnosis not present

## 2020-05-11 ENCOUNTER — Ambulatory Visit (INDEPENDENT_AMBULATORY_CARE_PROVIDER_SITE_OTHER): Payer: BC Managed Care – PPO | Admitting: Family Medicine

## 2020-05-11 ENCOUNTER — Other Ambulatory Visit: Payer: Self-pay

## 2020-05-11 ENCOUNTER — Ambulatory Visit (INDEPENDENT_AMBULATORY_CARE_PROVIDER_SITE_OTHER): Payer: BC Managed Care – PPO

## 2020-05-11 DIAGNOSIS — R200 Anesthesia of skin: Secondary | ICD-10-CM

## 2020-05-11 DIAGNOSIS — M5416 Radiculopathy, lumbar region: Secondary | ICD-10-CM | POA: Diagnosis not present

## 2020-05-11 DIAGNOSIS — Z820 Family history of epilepsy and other diseases of the nervous system: Secondary | ICD-10-CM

## 2020-05-11 MED ORDER — GADOBENATE DIMEGLUMINE 529 MG/ML IV SOLN
20.0000 mL | Freq: Once | INTRAVENOUS | Status: AC | PRN
  Administered 2020-05-11: 20 mL via INTRAVENOUS

## 2020-05-21 ENCOUNTER — Other Ambulatory Visit: Payer: Self-pay | Admitting: Allergy & Immunology

## 2020-05-23 ENCOUNTER — Other Ambulatory Visit (INDEPENDENT_AMBULATORY_CARE_PROVIDER_SITE_OTHER): Payer: Self-pay | Admitting: Family Medicine

## 2020-05-23 ENCOUNTER — Encounter (INDEPENDENT_AMBULATORY_CARE_PROVIDER_SITE_OTHER): Payer: Self-pay

## 2020-05-23 DIAGNOSIS — Z6841 Body Mass Index (BMI) 40.0 and over, adult: Secondary | ICD-10-CM

## 2020-05-23 DIAGNOSIS — F411 Generalized anxiety disorder: Secondary | ICD-10-CM

## 2020-05-23 DIAGNOSIS — F5081 Binge eating disorder: Secondary | ICD-10-CM

## 2020-05-23 MED ORDER — FLUOXETINE HCL 10 MG PO CAPS: 30.0000 mg | ORAL_CAPSULE | Freq: Every day | ORAL | 0 refills | Status: DC

## 2020-05-23 MED ORDER — LISDEXAMFETAMINE DIMESYLATE 20 MG PO CAPS: 20.0000 mg | ORAL_CAPSULE | Freq: Every day | ORAL | 0 refills | Status: DC

## 2020-05-23 NOTE — Telephone Encounter (Signed)
Dr Wallace pt °

## 2020-05-23 NOTE — Telephone Encounter (Signed)
Message sent to pt.

## 2020-05-25 DIAGNOSIS — F4323 Adjustment disorder with mixed anxiety and depressed mood: Secondary | ICD-10-CM | POA: Diagnosis not present

## 2020-05-30 ENCOUNTER — Encounter (INDEPENDENT_AMBULATORY_CARE_PROVIDER_SITE_OTHER): Payer: Self-pay | Admitting: Family Medicine

## 2020-05-30 ENCOUNTER — Other Ambulatory Visit: Payer: Self-pay

## 2020-05-30 ENCOUNTER — Telehealth (INDEPENDENT_AMBULATORY_CARE_PROVIDER_SITE_OTHER): Payer: BC Managed Care – PPO | Admitting: Family Medicine

## 2020-05-30 DIAGNOSIS — M25562 Pain in left knee: Secondary | ICD-10-CM | POA: Diagnosis not present

## 2020-05-30 DIAGNOSIS — F5081 Binge eating disorder: Secondary | ICD-10-CM | POA: Diagnosis not present

## 2020-05-30 DIAGNOSIS — M25561 Pain in right knee: Secondary | ICD-10-CM | POA: Diagnosis not present

## 2020-05-30 DIAGNOSIS — R5383 Other fatigue: Secondary | ICD-10-CM | POA: Diagnosis not present

## 2020-05-30 DIAGNOSIS — G8929 Other chronic pain: Secondary | ICD-10-CM

## 2020-05-30 DIAGNOSIS — F50819 Binge eating disorder, unspecified: Secondary | ICD-10-CM

## 2020-05-30 DIAGNOSIS — Z6841 Body Mass Index (BMI) 40.0 and over, adult: Secondary | ICD-10-CM

## 2020-05-30 DIAGNOSIS — E66813 Obesity, class 3: Secondary | ICD-10-CM

## 2020-05-30 NOTE — Telephone Encounter (Signed)
Pt called and visit changed to virtual

## 2020-05-31 NOTE — Progress Notes (Signed)
TeleHealth Visit:  Due to the COVID-19 pandemic, this visit was completed with telemedicine (audio/video) technology to reduce patient and provider exposure as well as to preserve personal protective equipment.   Cynthia [name] has verbally consented to this TeleHealth visit. The patient is located at home, the provider is located at the [facility] office. The participants in this visit include the listed provider and patient. The visit was conducted today via MyChart video.  Chief Complaint: OBESITY Cynthia [name] is here to discuss her progress with her obesity treatment plan along with follow-up of her obesity related diagnoses. Cynthia [name] is on the Category 2 Plan and keeping a food journal and adhering to recommended goals of 1400 calories and 80 grams of protein and states she is following her eating plan approximately 75% of the time. Cynthia [name] states she is doing more walking.  Today's visit was #: 52 Starting weight: 264 lbs Starting date: 05/01/2017  Interim History: Cynthia [name] hasn't felt well this week - fatigue, nasal congestion. She has not been exercising over the past few days because of this. She has a history of seasonal allergies. No known COVID exposure. Tolerating medications. Compliant. No concerns. Adhering to meal plan goals. Looking forward to adding brussel sprouts to Thanksgiving dinner.   Assessment/Plan:   1. Bilateral chronic knee pain She has an MRI scheduled on 04/29/2020.  She is followed by Orthopedics. We will continue to monitor symptoms as they relate to her weight loss journey. This issue directly impacts care plan for optimization of BMI and metabolic health as it impacts the patient's ability to make lifestyle changes.  2. Other fatigue Cynthia [name] says she has more fatigue than usual.   3. Binge eating disorder Stable. Cynthia [name] is taking Vyvanse 20 mg daily.   People who binge eat feel as if they don't have control over how much they eat and have feelings of guilt or  self-loathing after a binge eating episode. [name] estimates that about 30 percent of adults with binge eating disorder also have a history of ADHD. The FDA has approved Vyvanse as a treatment option for both ADHD and binge eating. Vyvanse targets the brain's reward center by increasing the levels of dopamine and norepinephrine, the chemicals of the brain responsible for feelings of pleasure. Mindful eating is the recommended nutritional approach to treating BED.   4. Class 3 severe obesity with serious comorbidity and body mass index (BMI) of 45.0 to 49.9 in adult, unspecified obesity type [facility])  Cynthia [name] is currently in the action stage of change. As such, her goal is to continue with weight loss efforts. She has agreed to keeping a food journal and adhering to recommended goals of 1400 calories and 80 grams of protein.   Exercise goals: For substantial health benefits, adults should do at least 150 minutes (2 hours and 30 minutes) a week of moderate-intensity, or 75 minutes (1 hour and 15 minutes) a week of vigorous-intensity aerobic physical activity, or an equivalent combination of moderate- and vigorous-intensity aerobic activity. Aerobic activity should be performed in episodes of at least 10 minutes, and preferably, it should be spread throughout the week.  Behavioral modification strategies: increasing lean protein intake, decreasing simple carbohydrates, increasing vegetables, increasing water intake and decreasing liquid calories.  Cynthia [name] has agreed to follow-up with our clinic in 3 weeks. She was informed of the importance of frequent follow-up visits to maximize her success with intensive lifestyle modifications for her multiple health conditions.  Objective:   VITALS: Per patient  if applicable, see vitals. GENERAL: Alert and in no acute distress. CARDIOPULMONARY: No increased WOB. Speaking in clear sentences.  PSYCH: Pleasant and cooperative. Speech normal rate and rhythm. Affect  is appropriate. Insight and judgement are appropriate. Attention is focused, linear, and appropriate.  NEURO: Oriented as arrived to appointment on time with no prompting.   Lab Results  Component Value Date   CREATININE 0.94 01/06/2020   BUN 11 01/06/2020   NA 139 01/06/2020   K 4.2 01/06/2020   CL 105 01/06/2020   CO2 19 (L) 01/06/2020   Lab Results  Component Value Date   ALT 44 (H) 01/06/2020   AST 27 01/06/2020   ALKPHOS 83 01/06/2020   BILITOT 0.3 01/06/2020   Lab Results  Component Value Date   HGBA1C 5.4 06/29/2019   HGBA1C 5.4 10/30/2018   HGBA1C 5.5 01/16/2018   HGBA1C 5.4 10/03/2017   HGBA1C 5.3 05/01/2017   Lab Results  Component Value Date   INSULIN 12.3 01/06/2020   INSULIN 17.7 06/29/2019   INSULIN 29.3 (H) 10/30/2018   INSULIN 12.9 01/16/2018   INSULIN 21.7 10/03/2017   Lab Results  Component Value Date   TSH 1.640 05/01/2017   Lab Results  Component Value Date   CHOL 190 01/06/2020   HDL 45 01/06/2020   LDLCALC 130 (H) 01/06/2020   TRIG 84 01/06/2020   CHOLHDL 3.4 01/09/2012   Lab Results  Component Value Date   WBC 7.7 01/06/2020   HGB 14.9 01/06/2020   HCT 45.0 01/06/2020   MCV 89 01/06/2020   PLT 308 01/06/2020   Attestation Statements:   Reviewed by clinician on day of visit: allergies, medications, problem list, medical history, surgical history, family history, social history, and previous encounter notes.  Time spent on visit including pre-visit chart review and post-visit charting and care was 30 minutes.   I, Insurance claims handler, CMA, am acting as transcriptionist for Helane Rima, DO  I have reviewed the above documentation for accuracy and completeness, and I agree with the above. Helane Rima, DO

## 2020-06-08 ENCOUNTER — Ambulatory Visit (INDEPENDENT_AMBULATORY_CARE_PROVIDER_SITE_OTHER): Payer: BC Managed Care – PPO | Admitting: Family Medicine

## 2020-06-14 DIAGNOSIS — F4323 Adjustment disorder with mixed anxiety and depressed mood: Secondary | ICD-10-CM | POA: Diagnosis not present

## 2020-06-15 DIAGNOSIS — H40059 Ocular hypertension, unspecified eye: Secondary | ICD-10-CM | POA: Diagnosis not present

## 2020-06-21 ENCOUNTER — Other Ambulatory Visit (INDEPENDENT_AMBULATORY_CARE_PROVIDER_SITE_OTHER): Payer: Self-pay | Admitting: Family Medicine

## 2020-06-21 ENCOUNTER — Encounter (INDEPENDENT_AMBULATORY_CARE_PROVIDER_SITE_OTHER): Payer: Self-pay

## 2020-06-21 DIAGNOSIS — Z6841 Body Mass Index (BMI) 40.0 and over, adult: Secondary | ICD-10-CM

## 2020-06-21 NOTE — Telephone Encounter (Signed)
Message sent to pt.

## 2020-06-21 NOTE — Telephone Encounter (Signed)
Dr Wallace pt °

## 2020-06-24 ENCOUNTER — Ambulatory Visit: Payer: BC Managed Care – PPO | Attending: Internal Medicine

## 2020-06-24 DIAGNOSIS — Z23 Encounter for immunization: Secondary | ICD-10-CM

## 2020-06-24 NOTE — Progress Notes (Signed)
   Covid-19 Vaccination Clinic  Name:  Cynthia [name]    MRN: [id number] DOB: [birth year]  06/24/2020  [name] was observed post Covid-19 immunization for 15 minutes without incident. She was provided with Vaccine Information Sheet and instruction to access the V-Safe system.   [name] was instructed to call 911 with any severe reactions post vaccine: Marland Kitchen Difficulty breathing  . Swelling of face and throat  . A fast heartbeat  . A bad rash all over body  . Dizziness and weakness   Immunizations Administered    Name Date Dose VIS Date Route   Pfizer COVID-19 Vaccine 06/24/2020  1:41 PM 0.3 mL 05/11/2020 Intramuscular   Manufacturer: [employer], [employer]   Lot: [id number]   NDC: [address]-9      Trendon Zaring T Pricilla Loveless

## 2020-06-29 ENCOUNTER — Encounter (INDEPENDENT_AMBULATORY_CARE_PROVIDER_SITE_OTHER): Payer: Self-pay | Admitting: Family Medicine

## 2020-06-29 ENCOUNTER — Other Ambulatory Visit: Payer: Self-pay

## 2020-06-29 ENCOUNTER — Ambulatory Visit (INDEPENDENT_AMBULATORY_CARE_PROVIDER_SITE_OTHER): Payer: BC Managed Care – PPO | Admitting: Family Medicine

## 2020-06-29 VITALS — BP 128/84 | HR 117 | Temp 97.8°F | Ht 62.0 in | Wt 253.0 lb

## 2020-06-29 DIAGNOSIS — Z9189 Other specified personal risk factors, not elsewhere classified: Secondary | ICD-10-CM | POA: Diagnosis not present

## 2020-06-29 DIAGNOSIS — E8881 Metabolic syndrome: Secondary | ICD-10-CM | POA: Diagnosis not present

## 2020-06-29 DIAGNOSIS — R5382 Chronic fatigue, unspecified: Secondary | ICD-10-CM | POA: Diagnosis not present

## 2020-06-29 DIAGNOSIS — R7303 Prediabetes: Secondary | ICD-10-CM

## 2020-06-29 DIAGNOSIS — F411 Generalized anxiety disorder: Secondary | ICD-10-CM

## 2020-06-29 DIAGNOSIS — R632 Polyphagia: Secondary | ICD-10-CM

## 2020-06-29 DIAGNOSIS — E559 Vitamin D deficiency, unspecified: Secondary | ICD-10-CM | POA: Diagnosis not present

## 2020-06-29 DIAGNOSIS — W19XXXA Unspecified fall, initial encounter: Secondary | ICD-10-CM

## 2020-06-29 DIAGNOSIS — Z6841 Body Mass Index (BMI) 40.0 and over, adult: Secondary | ICD-10-CM

## 2020-06-29 DIAGNOSIS — R5383 Other fatigue: Secondary | ICD-10-CM | POA: Diagnosis not present

## 2020-06-29 DIAGNOSIS — F5081 Binge eating disorder: Secondary | ICD-10-CM

## 2020-06-29 MED ORDER — SAXENDA 18 MG/3ML ~~LOC~~ SOPN: 3.0000 mg | PEN_INJECTOR | Freq: Three times a day (TID) | SUBCUTANEOUS | 0 refills | Status: DC

## 2020-06-30 DIAGNOSIS — M25512 Pain in left shoulder: Secondary | ICD-10-CM | POA: Diagnosis not present

## 2020-06-30 DIAGNOSIS — M25511 Pain in right shoulder: Secondary | ICD-10-CM | POA: Diagnosis not present

## 2020-06-30 LAB — COMPREHENSIVE METABOLIC PANEL
ALT: 11 IU/L (ref 0–32)
AST: 9 IU/L (ref 0–40)
Albumin/Globulin Ratio: 1.5 (ref 1.2–2.2)
Albumin: 4 g/dL (ref 3.8–4.8)
Alkaline Phosphatase: 81 IU/L (ref 44–121)
BUN/Creatinine Ratio: 12 (ref 9–23)
BUN: 10 mg/dL (ref 6–20)
Bilirubin Total: 0.3 mg/dL (ref 0.0–1.2)
CO2: 19 mmol/L — ABNORMAL LOW (ref 20–29)
Calcium: 9.1 mg/dL (ref 8.7–10.2)
Chloride: 104 mmol/L (ref 96–106)
Creatinine, Ser: 0.84 mg/dL (ref 0.57–1.00)
GFR calc Af Amer: 105 mL/min/{1.73_m2} (ref >59)
GFR calc non Af Amer: 91 mL/min/{1.73_m2} (ref >59)
Globulin, Total: 2.6 g/dL (ref 1.5–4.5)
Glucose: 86 mg/dL (ref 65–99)
Potassium: 4.3 mmol/L (ref 3.5–5.2)
Sodium: 139 mmol/L (ref 134–144)
Total Protein: 6.6 g/dL (ref 6.0–8.5)

## 2020-06-30 LAB — T4, FREE: Free T4: 1.28 ng/dL (ref 0.82–1.77)

## 2020-06-30 LAB — LIPID PANEL
Chol/HDL Ratio: 3.2 ratio (ref 0.0–4.4)
Cholesterol, Total: 184 mg/dL (ref 100–199)
HDL: 57 mg/dL (ref >39)
LDL Chol Calc (NIH): 115 mg/dL — ABNORMAL HIGH (ref 0–99)
Triglycerides: 63 mg/dL (ref 0–149)
VLDL Cholesterol Cal: 12 mg/dL (ref 5–40)

## 2020-06-30 LAB — CBC WITH DIFFERENTIAL/PLATELET
Basophils Absolute: 0 10*3/uL (ref 0.0–0.2)
Basos: 0 %
EOS (ABSOLUTE): 0.1 10*3/uL (ref 0.0–0.4)
Eos: 1 %
Hemoglobin: 14.3 g/dL (ref 11.1–15.9)
Immature Grans (Abs): 0.1 10*3/uL (ref 0.0–0.1)
Immature Granulocytes: 1 %
Lymphocytes Absolute: 1.7 10*3/uL (ref 0.7–3.1)
Lymphs: 21 %
MCH: 28.9 pg (ref 26.6–33.0)
MCHC: 32.7 g/dL (ref 31.5–35.7)
MCV: 89 fL (ref 79–97)
Monocytes Absolute: 0.6 10*3/uL (ref 0.1–0.9)
Monocytes: 7 %
Neutrophils Absolute: 5.8 10*3/uL (ref 1.4–7.0)
Neutrophils: 70 %
Platelets: 292 10*3/uL (ref 150–450)
RBC: 4.94 x10E6/uL (ref 3.77–5.28)
RDW: 12.9 % (ref 11.7–15.4)
WBC: 8.2 10*3/uL (ref 3.4–10.8)

## 2020-06-30 LAB — ANEMIA PANEL
Ferritin: 110 ng/mL (ref 15–150)
Folate, Hemolysate: 279 ng/mL
Folate, RBC: 638 ng/mL (ref >498)
Hematocrit: 43.7 % (ref 34.0–46.6)
Iron Saturation: 21 % (ref 15–55)
Iron: 62 ug/dL (ref 27–159)
Retic Ct Pct: 1.5 % (ref 0.6–2.6)
Total Iron Binding Capacity: 302 ug/dL (ref 250–450)
UIBC: 240 ug/dL (ref 131–425)
Vitamin B-12: 309 pg/mL (ref 232–1245)

## 2020-06-30 LAB — INSULIN, RANDOM: INSULIN: 13.7 u[IU]/mL (ref 2.6–24.9)

## 2020-06-30 LAB — TSH: TSH: 1.46 u[IU]/mL (ref 0.450–4.500)

## 2020-06-30 LAB — VITAMIN D 25 HYDROXY (VIT D DEFICIENCY, FRACTURES): Vit D, 25-Hydroxy: 29.3 ng/mL — ABNORMAL LOW (ref 30.0–100.0)

## 2020-06-30 LAB — HEMOGLOBIN A1C
Est. average glucose Bld gHb Est-mCnc: 105 mg/dL
Hgb A1c MFr Bld: 5.3 % (ref 4.8–5.6)

## 2020-06-30 NOTE — Progress Notes (Signed)
Chief Complaint:   OBESITY Cynthia [name] is here to discuss her progress with her obesity treatment plan along with follow-up of her obesity related diagnoses.   Today's visit was #: 53 Starting weight: 264 lbs Starting date: 05/01/2017 Today's weight: 253 lbs Today's date: 06/29/2020 Total lbs lost to date: 11 lbs Body mass index is 46.27 kg/m.  Total weight loss percentage to date: -4.17%  Interim History: [name] is on Saxenda 1.8 mg daily.  Her high weight was 285 pounds (with Victoza). Nutrition Plan: keeping a food journal and adhering to recommended goals of 1400 calories and 100 grams of protein.  Anti-obesity medications: Saxenda 1.8 mg subcutaneously daily.  Activity: Walking for 20-30 minutes 5 times per week.  Assessment/Plan:   1. Polyphagia Improving. She will continue to focus on protein-rich, low simple carbohydrate foods. We reviewed the importance of hydration, regular exercise for stress reduction, and restorative sleep.  - Anemia panel - CBC with Differential/Platelet - Comprehensive metabolic panel - Hemoglobin A1c - Insulin, random - Lipid panel - VITAMIN D 25 Hydroxy (Vit-D Deficiency, Fractures) - TSH - T4, free - Thyroid Peroxidase Antibodies (TPO) (REFL)  2. Vitamin D deficiency Not at goal. Current vitamin D is 27.6, tested on 01/06/2020. Optimal goal > 50 ng/dL.   Plan:  [x]   Continue Vitamin D @50 ,000 IU every week. []   Continue home supplement daily. [x]   Follow-up for routine testing of Vitamin D at least 2-3 times per year to avoid over-replacement.  - Anemia panel - CBC with Differential/Platelet - Comprehensive metabolic panel - Hemoglobin A1c - Insulin, random - Lipid panel - VITAMIN D 25 Hydroxy (Vit-D Deficiency, Fractures) - TSH - T4, free - Thyroid Peroxidase Antibodies (TPO) (REFL)  3. Fall, initial encounter [name] fell today after tripping on the stairs. She is sore but denies any specific concerns. We will continue to  monitor symptoms as they relate to her weight loss journey.  4. Other fatigue [name] complains of more fatigue than usual today.   Plan:  Will check labs today.  - Anemia panel - CBC with Differential/Platelet - Comprehensive metabolic panel - Hemoglobin A1c - Insulin, random - Lipid panel - VITAMIN D 25 Hydroxy (Vit-D Deficiency, Fractures) - TSH - T4, free - Thyroid Peroxidase Antibodies (TPO) (REFL)  5. Prediabetes At goal. Goal is HgbA1c < 5.7.  Medication: None.  She will continue to focus on protein-rich, low simple carbohydrate foods. We reviewed the importance of hydration, regular exercise for stress reduction, and restorative sleep.   Lab Results  Component Value Date   HGBA1C 5.3 06/29/2020   Lab Results  Component Value Date   INSULIN 13.7 06/29/2020   INSULIN 12.3 01/06/2020   INSULIN 17.7 06/29/2019   INSULIN 29.3 (H) 10/30/2018   INSULIN 12.9 01/16/2018   - Anemia panel - CBC with Differential/Platelet - Comprehensive metabolic panel - Hemoglobin A1c - Insulin, random - Lipid panel - VITAMIN D 25 Hydroxy (Vit-D Deficiency, Fractures) - TSH - T4, free - Thyroid Peroxidase Antibodies (TPO) (REFL)  6. Binge eating disorder Improving. Envi is taking Vyvanse 20 mg daily. I have consulted the [place] Controlled Substances Registry for this patient, and feel the risk/benefit ratio today is favorable for proceeding with this prescription for a controlled substance. The patient understands monitoring parameters and red flags.   - Anemia panel - CBC with Differential/Platelet - Comprehensive metabolic panel - Hemoglobin A1c - Insulin, random - Lipid panel - VITAMIN D 25 Hydroxy (Vit-D Deficiency, Fractures) - TSH -  T4, free - Thyroid Peroxidase Antibodies (TPO) (REFL)  7. GAD (generalized anxiety disorder) Behavior modification techniques were discussed today to help [name] deal with her anxiety.    - Anemia panel - CBC with Differential/Platelet -  Comprehensive metabolic panel - Hemoglobin A1c - Insulin, random - Lipid panel - VITAMIN D 25 Hydroxy (Vit-D Deficiency, Fractures) - TSH - T4, free - Thyroid Peroxidase Antibodies (TPO) (REFL)  8. At risk for activity intolerance [name] was given approximately 9 minutes of exercise intolerance counseling today. She is 34 y.o. female and has risk factors exercise intolerance due to having a fall today. We discussed intensive lifestyle modifications today with an emphasis on specific weight loss instructions and strategies. [name] will slowly increase activity as tolerated. Repetitive spaced learning was employed today to elicit superior memory formation and behavioral change.  9. Class 3 severe obesity with serious comorbidity and body mass index (BMI) of 45.0 to 49.9 in adult, unspecified obesity type (HCC)  - Refill Liraglutide -Weight Management (SAXENDA) 18 MG/3ML SOPN; Inject 3 mg into the skin 3 (three) times daily.  Dispense: 45 mL; Refill: 0  Course: [name] is currently in the action stage of change. As such, her goal is to continue with weight loss efforts.   Nutrition goals: She has agreed to keeping a food journal and adhering to recommended goals of 1400 calories and 100 grams of protein.   Exercise goals: For substantial health benefits, adults should do at least 150 minutes (2 hours and 30 minutes) a week of moderate-intensity, or 75 minutes (1 hour and 15 minutes) a week of vigorous-intensity aerobic physical activity, or an equivalent combination of moderate- and vigorous-intensity aerobic activity. Aerobic activity should be performed in episodes of at least 10 minutes, and preferably, it should be spread throughout the week.  Behavioral modification strategies: increasing lean protein intake, decreasing simple carbohydrates, increasing vegetables, increasing water intake, decreasing liquid calories, dealing with family or coworker sabotage, travel eating strategies and holiday  eating strategies .  [name] has agreed to follow-up with our clinic in 4 weeks. She was informed of the importance of frequent follow-up visits to maximize her success with intensive lifestyle modifications for her multiple health conditions.   Objective:   Blood pressure 128/84, pulse (!) 117, temperature 97.8 F (36.6 C), temperature source Oral, height 5\' 2"  (1.575 m), weight 253 lb (114.8 kg), SpO2 96 %. Body mass index is 46.27 kg/m.  General: Cooperative, alert, well developed, in no acute distress. HEENT: Conjunctivae and lids unremarkable. Cardiovascular: Regular rhythm.  Lungs: Normal work of breathing. Neurologic: No focal deficits.   Lab Results  Component Value Date   CREATININE 0.84 06/29/2020   BUN 10 06/29/2020   NA 139 06/29/2020   K 4.3 06/29/2020   CL 104 06/29/2020   CO2 19 (L) 06/29/2020   Lab Results  Component Value Date   ALT 11 06/29/2020   AST 9 06/29/2020   ALKPHOS 81 06/29/2020   BILITOT 0.3 06/29/2020   Lab Results  Component Value Date   HGBA1C 5.3 06/29/2020   HGBA1C 5.4 06/29/2019   HGBA1C 5.4 10/30/2018   HGBA1C 5.5 01/16/2018   HGBA1C 5.4 10/03/2017   Lab Results  Component Value Date   INSULIN 13.7 06/29/2020   INSULIN 12.3 01/06/2020   INSULIN 17.7 06/29/2019   INSULIN 29.3 (H) 10/30/2018   INSULIN 12.9 01/16/2018   Lab Results  Component Value Date   TSH 1.460 06/29/2020   Lab Results  Component Value Date  CHOL 184 06/29/2020   HDL 57 06/29/2020   LDLCALC 115 (H) 06/29/2020   TRIG 63 06/29/2020   CHOLHDL 3.2 06/29/2020   Lab Results  Component Value Date   WBC 8.2 06/29/2020   HGB 14.3 06/29/2020   HCT 43.7 06/29/2020   MCV 89 06/29/2020   PLT 292 06/29/2020   Lab Results  Component Value Date   IRON 62 06/29/2020   TIBC 302 06/29/2020   FERRITIN 110 06/29/2020   Attestation Statements:   Reviewed by clinician on day of visit: allergies, medications, problem list, medical history, surgical history,  family history, social history, and previous encounter notes.  I, Insurance claims handler, CMA, am acting as transcriptionist for Helane Rima, DO  I have reviewed the above documentation for accuracy and completeness, and I agree with the above. Helane Rima, DO

## 2020-07-04 DIAGNOSIS — F4323 Adjustment disorder with mixed anxiety and depressed mood: Secondary | ICD-10-CM | POA: Diagnosis not present

## 2020-07-13 DIAGNOSIS — G4700 Insomnia, unspecified: Secondary | ICD-10-CM | POA: Diagnosis not present

## 2020-07-13 DIAGNOSIS — E559 Vitamin D deficiency, unspecified: Secondary | ICD-10-CM | POA: Diagnosis not present

## 2020-07-13 DIAGNOSIS — K589 Irritable bowel syndrome without diarrhea: Secondary | ICD-10-CM | POA: Diagnosis not present

## 2020-07-19 ENCOUNTER — Other Ambulatory Visit (INDEPENDENT_AMBULATORY_CARE_PROVIDER_SITE_OTHER): Payer: Self-pay

## 2020-07-19 DIAGNOSIS — F5081 Binge eating disorder: Secondary | ICD-10-CM

## 2020-07-20 MED ORDER — LISDEXAMFETAMINE DIMESYLATE 20 MG PO CAPS: 20.0000 mg | ORAL_CAPSULE | Freq: Every day | ORAL | 0 refills | Status: DC

## 2020-07-28 DIAGNOSIS — F4323 Adjustment disorder with mixed anxiety and depressed mood: Secondary | ICD-10-CM | POA: Diagnosis not present

## 2020-08-02 ENCOUNTER — Ambulatory Visit (INDEPENDENT_AMBULATORY_CARE_PROVIDER_SITE_OTHER): Payer: BC Managed Care – PPO | Admitting: Family Medicine

## 2020-08-03 ENCOUNTER — Encounter (INDEPENDENT_AMBULATORY_CARE_PROVIDER_SITE_OTHER): Payer: Self-pay

## 2020-08-04 ENCOUNTER — Telehealth (INDEPENDENT_AMBULATORY_CARE_PROVIDER_SITE_OTHER): Payer: BC Managed Care – PPO | Admitting: Family Medicine

## 2020-08-04 ENCOUNTER — Other Ambulatory Visit: Payer: Self-pay

## 2020-08-04 ENCOUNTER — Encounter (INDEPENDENT_AMBULATORY_CARE_PROVIDER_SITE_OTHER): Payer: Self-pay | Admitting: Family Medicine

## 2020-08-04 DIAGNOSIS — E559 Vitamin D deficiency, unspecified: Secondary | ICD-10-CM

## 2020-08-04 DIAGNOSIS — Z6841 Body Mass Index (BMI) 40.0 and over, adult: Secondary | ICD-10-CM

## 2020-08-04 DIAGNOSIS — R7303 Prediabetes: Secondary | ICD-10-CM

## 2020-08-05 ENCOUNTER — Encounter (INDEPENDENT_AMBULATORY_CARE_PROVIDER_SITE_OTHER): Payer: Self-pay

## 2020-08-05 DIAGNOSIS — Z6841 Body Mass Index (BMI) 40.0 and over, adult: Secondary | ICD-10-CM

## 2020-08-05 DIAGNOSIS — F5081 Binge eating disorder: Secondary | ICD-10-CM

## 2020-08-05 DIAGNOSIS — F411 Generalized anxiety disorder: Secondary | ICD-10-CM

## 2020-08-10 NOTE — Progress Notes (Signed)
TeleHealth Visit:  Due to the COVID-19 pandemic, this visit was completed with telemedicine (audio/video) technology to reduce patient and provider exposure as well as to preserve personal protective equipment.   Cynthia [name] has verbally consented to this TeleHealth visit. The patient is located at home, the provider is located at the [facility] office. The participants in this visit include the listed provider and patient. The visit was conducted today via MyChart video.  Chief Complaint: OBESITY Cynthia [name] is here to discuss her progress with her obesity treatment plan along with follow-up of her obesity related diagnoses. Cynthia [name] is on keeping a food journal and adhering to recommended goals of 1400 calories and 100 grams of protein and states she is following her eating plan approximately 80-90% of the time. Cynthia [name] states she is not exercising regularly at this time.  Today's visit was #: 54 Starting weight: 264 lbs Starting date: 05/01/2017  Interim History: Cynthia [name] reports home weight of 256-257 pounds.  She has been tapering down on Saxenda.  She recently started a new job.  She says the holidays were quiet and she spent time with her parents and her puppy.  She was taking 2.1 mg Saxenda daily and is now on 1.8 mg daily.  Subjective:   1. Prediabetes Cynthia [name] has a diagnosis of prediabetes based on her elevated HgA1c and was informed this puts her at greater risk of developing diabetes. She continues to work on diet and exercise to decrease her risk of diabetes. She denies nausea or hypoglycemia.  She is currently on a GLP-1.  Lab Results  Component Value Date   HGBA1C 5.3 06/29/2020   Lab Results  Component Value Date   INSULIN 13.7 06/29/2020   INSULIN 12.3 01/06/2020   INSULIN 17.7 06/29/2019   INSULIN 29.3 (H) 10/30/2018   INSULIN 12.9 01/16/2018   2. Vitamin D deficiency Cynthia [name]'s Vitamin D level was 29.3 on 06/29/2020. She is currently taking prescription vitamin D  50,000 IU each week. She denies nausea, vomiting or muscle weakness.  She endorses fatigue.  Assessment/Plan:   1. Prediabetes Cynthia [name] will continue to work on weight loss, exercise, and decreasing simple carbohydrates to help decrease the risk of diabetes.  Continue GLP-1 with increase in dose to 6 clicks up from 1.8 mg daily.  2. Vitamin D deficiency Low Vitamin D level contributes to fatigue and are associated with obesity, breast, and colon cancer. She agrees to continue to take prescription Vitamin D @50 ,000 IU every week and will follow-up for routine testing of Vitamin D, at least 2-3 times per year to avoid over-replacement.  She does not currently need a refill.  3. Class 3 severe obesity with serious comorbidity and body mass index (BMI) of 45.0 to 49.9 in adult, unspecified obesity type [name])  Cynthia [name] is currently in the action stage of change. As such, her goal is to continue with weight loss efforts. She has agreed to the Category 2 Plan and keeping a food journal and adhering to recommended goals of 1400 calories and 80-90+ grams of protein.   Increase Saxenda to 6 clicks up from 1.8 mg daily.  Exercise goals: For substantial health benefits, adults should do at least 150 minutes (2 hours and 30 minutes) a week of moderate-intensity, or 75 minutes (1 hour and 15 minutes) a week of vigorous-intensity aerobic physical activity, or an equivalent combination of moderate- and vigorous-intensity aerobic activity. Aerobic activity should be performed in episodes of at least 10 minutes, and preferably, it  should be spread throughout the week.  Behavioral modification strategies: increasing lean protein intake, meal planning and cooking strategies, keeping healthy foods in the home and planning for success.  Cynthia [name] has agreed to follow-up with our clinic in 3 weeks with Dr. Earlene Plater. She was informed of the importance of frequent follow-up visits to maximize her success with intensive lifestyle  modifications for her multiple health conditions.  Objective:   VITALS: Per patient if applicable, see vitals. GENERAL: Alert and in no acute distress. CARDIOPULMONARY: No increased WOB. Speaking in clear sentences.  PSYCH: Pleasant and cooperative. Speech normal rate and rhythm. Affect is appropriate. Insight and judgement are appropriate. Attention is focused, linear, and appropriate.  NEURO: Oriented as arrived to appointment on time with no prompting.   Lab Results  Component Value Date   CREATININE 0.84 06/29/2020   BUN 10 06/29/2020   NA 139 06/29/2020   K 4.3 06/29/2020   CL 104 06/29/2020   CO2 19 (L) 06/29/2020   Lab Results  Component Value Date   ALT 11 06/29/2020   AST 9 06/29/2020   ALKPHOS 81 06/29/2020   BILITOT 0.3 06/29/2020   Lab Results  Component Value Date   HGBA1C 5.3 06/29/2020   HGBA1C 5.4 06/29/2019   HGBA1C 5.4 10/30/2018   HGBA1C 5.5 01/16/2018   HGBA1C 5.4 10/03/2017   Lab Results  Component Value Date   INSULIN 13.7 06/29/2020   INSULIN 12.3 01/06/2020   INSULIN 17.7 06/29/2019   INSULIN 29.3 (H) 10/30/2018   INSULIN 12.9 01/16/2018   Lab Results  Component Value Date   TSH 1.460 06/29/2020   Lab Results  Component Value Date   CHOL 184 06/29/2020   HDL 57 06/29/2020   LDLCALC 115 (H) 06/29/2020   TRIG 63 06/29/2020   CHOLHDL 3.2 06/29/2020   Lab Results  Component Value Date   WBC 8.2 06/29/2020   HGB 14.3 06/29/2020   HCT 43.7 06/29/2020   MCV 89 06/29/2020   PLT 292 06/29/2020   Lab Results  Component Value Date   IRON 62 06/29/2020   TIBC 302 06/29/2020   FERRITIN 110 06/29/2020   Attestation Statements:   Reviewed by clinician on day of visit: allergies, medications, problem list, medical history, surgical history, family history, social history, and previous encounter notes.  I, Insurance claims handler, CMA, am acting as transcriptionist for Reuben Likes, MD.  I have reviewed the above documentation for accuracy and  completeness, and I agree with the above. - Katherina Mires, MD

## 2020-08-19 MED ORDER — SAXENDA 18 MG/3ML ~~LOC~~ SOPN: 3.0000 mg | PEN_INJECTOR | Freq: Every day | SUBCUTANEOUS | 0 refills | Status: DC

## 2020-08-19 MED ORDER — LISDEXAMFETAMINE DIMESYLATE 20 MG PO CAPS: 20.0000 mg | ORAL_CAPSULE | Freq: Every day | ORAL | 0 refills | Status: DC

## 2020-08-19 MED ORDER — FLUOXETINE HCL 10 MG PO CAPS
30.0000 mg | ORAL_CAPSULE | Freq: Every day | ORAL | 0 refills | Status: DC
Start: 2020-08-19 — End: 2020-09-27

## 2020-08-22 ENCOUNTER — Telehealth (INDEPENDENT_AMBULATORY_CARE_PROVIDER_SITE_OTHER): Payer: Self-pay

## 2020-08-22 NOTE — Telephone Encounter (Signed)
PA has been initiated via [email or web address] for Vyvanse.  KeyPollyann Savoy   Rx #: [id number]  Drug: Vyvanse 20MG  capsules Form: [employer] [place] Electronic Request Form (CB) Original Claim Info 70 PRODUCT OR SERVICE NOT COVERED. PLEASECONTACT PRESCRIBER   Your information has been submitted to [facility]. [employer] [place] will review the request and notify you of the determination decision directly, typically within 72 hours of receiving all information.  You will also receive your request decision electronically. To check for an update later, open this request again from your dashboard.  If [facility] [place] [place] has not responded within the specified timeframe or if you have any questions about your PA submission, contact [employer] [place] directly at [phone].

## 2020-08-23 ENCOUNTER — Ambulatory Visit (INDEPENDENT_AMBULATORY_CARE_PROVIDER_SITE_OTHER): Payer: BC Managed Care – PPO | Admitting: Family Medicine

## 2020-08-24 ENCOUNTER — Ambulatory Visit (INDEPENDENT_AMBULATORY_CARE_PROVIDER_SITE_OTHER): Payer: BC Managed Care – PPO | Admitting: Family Medicine

## 2020-08-28 ENCOUNTER — Encounter (INDEPENDENT_AMBULATORY_CARE_PROVIDER_SITE_OTHER): Payer: Self-pay | Admitting: Family Medicine

## 2020-08-29 ENCOUNTER — Encounter (INDEPENDENT_AMBULATORY_CARE_PROVIDER_SITE_OTHER): Payer: Self-pay | Admitting: Family Medicine

## 2020-08-29 ENCOUNTER — Telehealth (INDEPENDENT_AMBULATORY_CARE_PROVIDER_SITE_OTHER): Payer: BC Managed Care – PPO | Admitting: Family Medicine

## 2020-08-29 ENCOUNTER — Other Ambulatory Visit: Payer: Self-pay

## 2020-08-29 DIAGNOSIS — R632 Polyphagia: Secondary | ICD-10-CM

## 2020-08-29 DIAGNOSIS — F5081 Binge eating disorder: Secondary | ICD-10-CM

## 2020-08-29 DIAGNOSIS — G8929 Other chronic pain: Secondary | ICD-10-CM

## 2020-08-29 DIAGNOSIS — M25562 Pain in left knee: Secondary | ICD-10-CM

## 2020-08-29 DIAGNOSIS — M25561 Pain in right knee: Secondary | ICD-10-CM

## 2020-08-29 DIAGNOSIS — R7303 Prediabetes: Secondary | ICD-10-CM | POA: Diagnosis not present

## 2020-08-29 DIAGNOSIS — Z6841 Body Mass Index (BMI) 40.0 and over, adult: Secondary | ICD-10-CM

## 2020-08-31 NOTE — Progress Notes (Signed)
TeleHealth Visit:  Due to the COVID-19 pandemic, this visit was completed with telemedicine (audio/video) technology to reduce patient and provider exposure as well as to preserve personal protective equipment.   Cynthia [name] has verbally consented to this TeleHealth visit. The patient is located at home, the provider is located at the [facility] office. The participants in this visit include the listed provider and patient and. The visit was conducted today via MyChart.  OBESITY Cynthia [name] is here to discuss her progress with her obesity treatment plan along with follow-up of her obesity related diagnoses.   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  She says she has been busy but enjoying the new job. Nutrition Plan: Category 2 Plan and keeping a food journal and adhering to recommended goals of 1400 calories and 80-90+ grams of protein for 50% of the time. Anti-obesity medications: Saxenda. Reported side effects: None. Activity: None at this time.  Assessment/Plan:   1. Polyphagia Improving, but not optimized. Current treatment: Saxenda 3 mg subcutaneously daily. Polyphagia refers to excessive feelings of hunger.  Plan:  Continue Saxenda.  She will continue to focus on protein-rich, low simple carbohydrate foods. We reviewed the importance of hydration, regular exercise for stress reduction, and restorative sleep.  2. Prediabetes Controlled. Goal is HgbA1c < 5.7.  Medication: Saxenda 3 mg subcutaneously daily.    Plan:  She will continue to focus on protein-rich, low simple carbohydrate foods. We reviewed the importance of hydration, regular exercise for stress reduction, and restorative sleep.   Lab Results  Component Value Date   HGBA1C 5.3 06/29/2020   Lab Results  Component Value Date   INSULIN 13.7 06/29/2020   INSULIN 12.3 01/06/2020    INSULIN 17.7 06/29/2019   INSULIN 29.3 (H) 10/30/2018   INSULIN 12.9 01/16/2018   3. Bilateral chronic knee pain She is followed by Orthopedics. We will continue to monitor symptoms as they relate to her weight loss journey. This issue directly impacts care plan for optimization of BMI and metabolic health as it impacts the patient's ability to make lifestyle changes.  4. Binge eating disorder Cynthia [name] is taking Vyvanse 20 mg daily.  People who binge eat feel as if they don't have control over how much they eat and have feelings of guilt or self-loathing after a binge eating episode. [name] estimates that about 30 percent of adults with binge eating disorder also have a history of ADHD. The FDA has approved Vyvanse as a treatment option for both ADHD and binge eating. Vyvanse targets the brain's reward center by increasing the levels of dopamine and norepinephrine, the chemicals of the brain responsible for feelings of pleasure. Mindful eating is the recommended nutritional approach to treating BED.   I have consulted the [place] Controlled Substances Registry for this patient, and feel the risk/benefit ratio today is favorable for proceeding with this prescription for a controlled substance. The patient understands monitoring parameters and red flags.   5. Class 3 severe obesity with serious comorbidity and body mass index (BMI) of 45.0 to 49.9 in adult, unspecified obesity type [facility])  Course: Cynthia [name] is currently in the action stage of change. As such, her goal is to continue with weight loss efforts.   Nutrition goals: She has agreed to keeping a food journal and adhering to recommended goals of 1400 calories and 95 grams of protein.   Exercise  goals: For substantial health benefits, adults should do at least 150 minutes (2 hours and 30 minutes) a week of moderate-intensity, or 75 minutes (1 hour and 15 minutes) a week of vigorous-intensity aerobic physical activity, or an equivalent  combination of moderate- and vigorous-intensity aerobic activity. Aerobic activity should be performed in episodes of at least 10 minutes, and preferably, it should be spread throughout the week.  Behavioral modification strategies: increasing lean protein intake, decreasing simple carbohydrates, increasing vegetables, increasing water intake and decreasing liquid calories.  Cynthia [name] has agreed to follow-up with our clinic in 3 weeks. She was informed of the importance of frequent follow-up visits to maximize her success with intensive lifestyle modifications for her multiple health conditions.   Objective:   There were no vitals taken for this visit. There is no height or weight on file to calculate BMI.  General: Cooperative, alert, well developed, in no acute distress. HEENT: Conjunctivae and lids unremarkable. Cardiovascular: Regular rhythm.  Lungs: Normal work of breathing. Neurologic: No focal deficits.   Lab Results  Component Value Date   CREATININE 0.84 06/29/2020   BUN 10 06/29/2020   NA 139 06/29/2020   K 4.3 06/29/2020   CL 104 06/29/2020   CO2 19 (L) 06/29/2020   Lab Results  Component Value Date   ALT 11 06/29/2020   AST 9 06/29/2020   ALKPHOS 81 06/29/2020   BILITOT 0.3 06/29/2020   Lab Results  Component Value Date   HGBA1C 5.3 06/29/2020   HGBA1C 5.4 06/29/2019   HGBA1C 5.4 10/30/2018   HGBA1C 5.5 01/16/2018   HGBA1C 5.4 10/03/2017   Lab Results  Component Value Date   INSULIN 13.7 06/29/2020   INSULIN 12.3 01/06/2020   INSULIN 17.7 06/29/2019   INSULIN 29.3 (H) 10/30/2018   INSULIN 12.9 01/16/2018   Lab Results  Component Value Date   TSH 1.460 06/29/2020   Lab Results  Component Value Date   CHOL 184 06/29/2020   HDL 57 06/29/2020   LDLCALC 115 (H) 06/29/2020   TRIG 63 06/29/2020   CHOLHDL 3.2 06/29/2020   Lab Results  Component Value Date   WBC 8.2 06/29/2020   HGB 14.3 06/29/2020   HCT 43.7 06/29/2020   MCV 89 06/29/2020   PLT 292  06/29/2020   Lab Results  Component Value Date   IRON 62 06/29/2020   TIBC 302 06/29/2020   FERRITIN 110 06/29/2020   Attestation Statements:   Reviewed by clinician on day of visit: allergies, medications, problem list, medical history, surgical history, family history, social history, and previous encounter notes.  I, Insurance claims handler, CMA, am acting as transcriptionist for Helane Rima, DO  I have reviewed the above documentation for accuracy and completeness, and I agree with the above. Helane Rima, DO

## 2020-09-07 ENCOUNTER — Telehealth (INDEPENDENT_AMBULATORY_CARE_PROVIDER_SITE_OTHER): Payer: Self-pay

## 2020-09-07 NOTE — Telephone Encounter (Signed)
Bernie Covey is no longer a covered benefit with her insurance

## 2020-09-27 ENCOUNTER — Ambulatory Visit (INDEPENDENT_AMBULATORY_CARE_PROVIDER_SITE_OTHER): Payer: BC Managed Care – PPO | Admitting: Family Medicine

## 2020-09-27 ENCOUNTER — Other Ambulatory Visit: Payer: Self-pay

## 2020-09-27 ENCOUNTER — Encounter (INDEPENDENT_AMBULATORY_CARE_PROVIDER_SITE_OTHER): Payer: Self-pay | Admitting: Family Medicine

## 2020-09-27 VITALS — BP 128/78 | HR 109 | Temp 98.4°F | Ht 62.0 in | Wt 254.0 lb

## 2020-09-27 DIAGNOSIS — F5081 Binge eating disorder: Secondary | ICD-10-CM

## 2020-09-27 DIAGNOSIS — Z6841 Body Mass Index (BMI) 40.0 and over, adult: Secondary | ICD-10-CM

## 2020-09-27 DIAGNOSIS — E8881 Metabolic syndrome: Secondary | ICD-10-CM

## 2020-09-27 DIAGNOSIS — F3289 Other specified depressive episodes: Secondary | ICD-10-CM

## 2020-09-27 DIAGNOSIS — E559 Vitamin D deficiency, unspecified: Secondary | ICD-10-CM

## 2020-09-27 DIAGNOSIS — E65 Localized adiposity: Secondary | ICD-10-CM

## 2020-09-27 DIAGNOSIS — F50819 Binge eating disorder, unspecified: Secondary | ICD-10-CM

## 2020-09-27 DIAGNOSIS — Z9189 Other specified personal risk factors, not elsewhere classified: Secondary | ICD-10-CM | POA: Diagnosis not present

## 2020-09-27 DIAGNOSIS — E66813 Obesity, class 3: Secondary | ICD-10-CM

## 2020-09-27 NOTE — Progress Notes (Signed)
Chief Complaint:   OBESITY Cynthia [name] is here to discuss her progress with her obesity treatment plan along with follow-up of her obesity related diagnoses.   Today's visit was #: 56 Starting weight: 264 lbs Starting date: 05/01/2017 Today's weight: 254 lbs Today's date: 09/27/2020 Total lbs lost to date: 10 lbs Body mass index is 46.46 kg/m.  Total weight loss percentage to date: -3.79%  Interim History:  Cynthia [name] says her new job is stressful but she is still happy.  She says she is trying to continue healthy eating habits.  She is considering bariatric surgery.  Current Meal Plan: keeping a food journal and adhering to recommended goals of 1400 calories and 95 grams of protein for 50%.  Current Exercise Plan: None. Current Anti-Obesity Medications: Saxenda 2.4 mg subcutaneously daily. Side effects: None.  Assessment/Plan:   1. Metabolic syndrome Starting goal: Lose 7-10% of starting weight. She will continue to focus on protein-rich, low simple carbohydrate foods. We reviewed the importance of hydration, regular exercise for stress reduction, and restorative sleep.  We will continue to check lab work every 3 months, with 10% weight loss, or should any other concerns arise.  - Start liraglutide (VICTOZA) 18 MG/3ML SOPN; Inject 1.8 mg into the skin daily.  Dispense: 9 mL; Refill: 0 - Start Insulin Pen Needle 32G X 4 MM MISC; 1 each by Does not apply route once a week.  Dispense: 50 each; Refill: 0  Counseling . Liraglutide (Victoza/Saxenda) is a medication that is approved at 1.8 mg per day for the treatment of diabetes mellitus, and at 3.0 mg per day for the treatment of obesity. Some patients may lose 5-10% body weight, especially at the higher dose.  . Liraglutide works by: o Slowing the food leaving your stomach. o Preventing your liver from making too much sugar. o Helping your pancreas make more insulin when your blood sugars are high.  o Sending a signal to your brain that  decreases your appetite.  . It will take around 2 weeks for you to see that the medication is working.  . Liraglutide should not cause hypoglycemia (low blood sugars) UNLESS YOU ALSO TAKE INSULIN OR A SULFONYLUREA (glipizide or glimepiride).  . The most common side effects of Liraglutide include: nausea, vomiting, diarrhea, constipation, and indigestion.   2. Visceral obesity Current visceral fat rating: 16. Visceral fat rating should be < 13. Visceral adipose tissue is a hormonally active component of total body fat. This body composition phenotype is associated with medical disorders such as metabolic syndrome, cardiovascular disease and several malignancies including prostate, breast, and colorectal cancers. Starting goal: Lose 7-10% of starting weight.   3. Vitamin D deficiency Not at goal. Current vitamin D is 29.3, tested on 06/29/2020. Optimal goal > 50 ng/dL.  She is taking vitamin D 50,000 IU weekly.  Plan: Continue to take prescription Vitamin D @50 ,000 IU every week as prescribed.  Follow-up for routine testing of Vitamin D, at least 2-3 times per year to avoid over-replacement.  - Refill Vitamin D, Ergocalciferol, (DRISDOL) 1.25 MG (50000 UNIT) CAPS capsule; Take 1 capsule (50,000 Units total) by mouth every 7 (seven) days.  Dispense: 4 capsule; Refill: 0  4. Binge eating disorder Cynthia [name] is taking Vyvanse 20 mg daily.  Plan:  Increase Vyvanse to 40 mg daily, as per below.  People who binge eat feel as if they don't have control over how much they eat and have feelings of guilt or self-loathing after a binge eating  episode. [employer] estimates that about 30 percent of adults with binge eating disorder also have a history of ADHD. The FDA has approved Vyvanse as a treatment option for both ADHD and binge eating. Vyvanse targets the brain's reward center by increasing the levels of dopamine and norepinephrine, the chemicals of the brain responsible for feelings of pleasure. Mindful  eating is the recommended nutritional approach to treating BED.   I have consulted the [place] Controlled Substances Registry for this patient, and feel the risk/benefit ratio today is favorable for proceeding with this prescription for a controlled substance. The patient understands monitoring parameters and red flags.   - Increase Lisdexamfetamine Dimesylate (VYVANSE) 40 MG CHEW; Chew 40 mg by mouth daily.  Dispense: 30 tablet; Refill: 0  5. Other depression, with emotional eating Improving, but not optimized. Medication: Prozac 30 mg daily.  Plan:  Behavior modification techniques were discussed today to help deal with emotional/non-hunger eating behaviors.  - FLUoxetine (PROZAC) 10 MG capsule; Take 3 capsules (30 mg total) by mouth daily.  Dispense: 90 capsule; Refill: 0  6. At risk for activity intolerance Cynthia [name] was given approximately 10 minutes of counseling today regarding her increased risk for exercise intolerance.  We discussed patient's specific personal and medical issues that raise our concern.  She was advised of strategies to prevent injury and ways to improve her cardiopulmonary fitness levels slowly over time.  We additionally discussed various fitness trackers and smart phone apps to help motivate the patient to stay on track.   7. Class 3 severe obesity with serious comorbidity and body mass index (BMI) of 45.0 to 49.9 in adult, unspecified obesity type [facility])  Course: Cynthia [name] is currently in the action stage of change. As such, her goal is to continue with weight loss efforts.   Nutrition goals: She has agreed to keeping a food journal and adhering to recommended goals of 1400 calories and 95 grams of protein.   Exercise goals: For substantial health benefits, adults should do at least 150 minutes (2 hours and 30 minutes) a week of moderate-intensity, or 75 minutes (1 hour and 15 minutes) a week of vigorous-intensity aerobic physical activity, or an equivalent combination of  moderate- and vigorous-intensity aerobic activity. Aerobic activity should be performed in episodes of at least 10 minutes, and preferably, it should be spread throughout the week.  Behavioral modification strategies: increasing lean protein intake, decreasing simple carbohydrates, increasing vegetables and increasing water intake.  Cynthia [name] has agreed to follow-up with our clinic in 4 weeks. She was informed of the importance of frequent follow-up visits to maximize her success with intensive lifestyle modifications for her multiple health conditions.   Objective:   Blood pressure 128/78, pulse (!) 109, temperature 98.4 F (36.9 C), temperature source Oral, height 5\' 2"  (1.575 m), weight 254 lb (115.2 kg), SpO2 96 %. Body mass index is 46.46 kg/m.  General: Cooperative, alert, well developed, in no acute distress. HEENT: Conjunctivae and lids unremarkable. Cardiovascular: Regular rhythm.  Lungs: Normal work of breathing. Neurologic: No focal deficits.   Lab Results  Component Value Date   CREATININE 0.84 06/29/2020   BUN 10 06/29/2020   NA 139 06/29/2020   K 4.3 06/29/2020   CL 104 06/29/2020   CO2 19 (L) 06/29/2020   Lab Results  Component Value Date   ALT 11 06/29/2020   AST 9 06/29/2020   ALKPHOS 81 06/29/2020   BILITOT 0.3 06/29/2020   Lab Results  Component Value Date   HGBA1C 5.3  06/29/2020   HGBA1C 5.4 06/29/2019   HGBA1C 5.4 10/30/2018   HGBA1C 5.5 01/16/2018   HGBA1C 5.4 10/03/2017   Lab Results  Component Value Date   INSULIN 13.7 06/29/2020   INSULIN 12.3 01/06/2020   INSULIN 17.7 06/29/2019   INSULIN 29.3 (H) 10/30/2018   INSULIN 12.9 01/16/2018   Lab Results  Component Value Date   TSH 1.460 06/29/2020   Lab Results  Component Value Date   CHOL 184 06/29/2020   HDL 57 06/29/2020   LDLCALC 115 (H) 06/29/2020   TRIG 63 06/29/2020   CHOLHDL 3.2 06/29/2020   Lab Results  Component Value Date   WBC 8.2 06/29/2020   HGB 14.3 06/29/2020   HCT  43.7 06/29/2020   MCV 89 06/29/2020   PLT 292 06/29/2020   Lab Results  Component Value Date   IRON 62 06/29/2020   TIBC 302 06/29/2020   FERRITIN 110 06/29/2020   Attestation Statements:   Reviewed by clinician on day of visit: allergies, medications, problem list, medical history, surgical history, family history, social history, and previous encounter notes.  I, Insurance claims handler, CMA, am acting as transcriptionist for Helane Rima, DO  I have reviewed the above documentation for accuracy and completeness, and I agree with the above. Helane Rima, DO

## 2020-09-28 MED ORDER — INSULIN PEN NEEDLE 32G X 4 MM MISC: 1.0000 | 0 refills | Status: DC

## 2020-09-28 MED ORDER — VITAMIN D (ERGOCALCIFEROL) 1.25 MG (50000 UNIT) PO CAPS: 50000.0000 [IU] | ORAL_CAPSULE | ORAL | 0 refills | Status: DC

## 2020-09-28 MED ORDER — VICTOZA 18 MG/3ML ~~LOC~~ SOPN: 1.8000 mg | PEN_INJECTOR | Freq: Every day | SUBCUTANEOUS | 0 refills | Status: DC

## 2020-09-28 MED ORDER — VYVANSE 40 MG PO CHEW: 40.0000 mg | CHEWABLE_TABLET | Freq: Every day | ORAL | 0 refills | Status: DC

## 2020-09-28 MED ORDER — FLUOXETINE HCL 10 MG PO CAPS: 30.0000 mg | ORAL_CAPSULE | Freq: Every day | ORAL | 0 refills | Status: DC

## 2020-10-04 ENCOUNTER — Telehealth (INDEPENDENT_AMBULATORY_CARE_PROVIDER_SITE_OTHER): Payer: Self-pay | Admitting: Family Medicine

## 2020-10-04 NOTE — Telephone Encounter (Signed)
PA submitted and do not have a response yet

## 2020-10-04 NOTE — Telephone Encounter (Signed)
[facility] called regarding prescription for Victoza.  [employer] does not want to cover and wondering if there is an alternative for patient. Last seen by Dr. Earlene Plater. Pharmacy number is [id number].

## 2020-10-05 DIAGNOSIS — F4323 Adjustment disorder with mixed anxiety and depressed mood: Secondary | ICD-10-CM | POA: Diagnosis not present

## 2020-10-10 ENCOUNTER — Other Ambulatory Visit (INDEPENDENT_AMBULATORY_CARE_PROVIDER_SITE_OTHER): Payer: Self-pay

## 2020-10-10 ENCOUNTER — Encounter (INDEPENDENT_AMBULATORY_CARE_PROVIDER_SITE_OTHER): Payer: Self-pay | Admitting: Family Medicine

## 2020-10-11 ENCOUNTER — Encounter (INDEPENDENT_AMBULATORY_CARE_PROVIDER_SITE_OTHER): Payer: Self-pay | Admitting: Family Medicine

## 2020-10-12 NOTE — Telephone Encounter (Signed)
PA denied due to pt not having type 2 DM (used DX:R73.01-impaired fasting glucose)

## 2020-10-13 NOTE — Telephone Encounter (Signed)
Let her know

## 2020-10-25 ENCOUNTER — Encounter (INDEPENDENT_AMBULATORY_CARE_PROVIDER_SITE_OTHER): Payer: Self-pay

## 2020-10-25 ENCOUNTER — Other Ambulatory Visit: Payer: Self-pay

## 2020-10-25 ENCOUNTER — Other Ambulatory Visit (INDEPENDENT_AMBULATORY_CARE_PROVIDER_SITE_OTHER): Payer: Self-pay

## 2020-10-25 ENCOUNTER — Encounter (INDEPENDENT_AMBULATORY_CARE_PROVIDER_SITE_OTHER): Payer: Self-pay | Admitting: Family Medicine

## 2020-10-25 ENCOUNTER — Telehealth (INDEPENDENT_AMBULATORY_CARE_PROVIDER_SITE_OTHER): Payer: BC Managed Care – PPO | Admitting: Family Medicine

## 2020-10-25 DIAGNOSIS — N926 Irregular menstruation, unspecified: Secondary | ICD-10-CM

## 2020-10-25 DIAGNOSIS — F50819 Binge eating disorder, unspecified: Secondary | ICD-10-CM

## 2020-10-25 DIAGNOSIS — F5081 Binge eating disorder: Secondary | ICD-10-CM

## 2020-10-25 DIAGNOSIS — Z6841 Body Mass Index (BMI) 40.0 and over, adult: Secondary | ICD-10-CM

## 2020-10-25 DIAGNOSIS — F3289 Other specified depressive episodes: Secondary | ICD-10-CM | POA: Diagnosis not present

## 2020-10-25 MED ORDER — FLUOXETINE HCL 10 MG PO CAPS: 30.0000 mg | ORAL_CAPSULE | Freq: Every day | ORAL | 0 refills | Status: DC

## 2020-10-25 MED ORDER — VYVANSE 40 MG PO CHEW: 40.0000 mg | CHEWABLE_TABLET | Freq: Every day | ORAL | 0 refills | Status: DC

## 2020-10-27 NOTE — Progress Notes (Signed)
TeleHealth Visit:  Due to the COVID-19 pandemic, this visit was completed with telemedicine (audio/video) technology to reduce patient and provider exposure as well as to preserve personal protective equipment.   [name] has verbally consented to this TeleHealth visit. The patient is located at home, the provider is located at the [facility] [facility] office. The participants in this visit include the listed provider and patient. The visit was conducted today via MyChart video.  Chief Complaint: OBESITY Cynthia [name] is here to discuss her progress with her obesity treatment plan along with follow-up of her obesity related diagnoses. Cynthia [name] is on keeping a food journal and adhering to recommended goals of 1400 calories and 95 grams of protein and states she is following her eating plan approximately 80% of the time. Cynthia [name] states she is not exercising regularly.  Today's visit was #: 57 Starting weight: 264 lbs Starting date: 05/01/2017  Interim History: Cynthia [name] feels she struggles to get in protein.  She feels she is retaining water and notes periods are irregular.  She is not journaling other than tracking protein.  However, she eats fairly repetitively and feels she is meeting calories goals.  She is on Saxenda 1.8-2.4 mg alternating to make it last longer.    Insurance is not covering [place] or Victoza.  Appetite is well controled to the point that she sometimes forgets to eat.  Subjective:   1. Irregular periods Notes fluid retention and irregular periods.  She does not currently have a GYN.  On OCP.  2. Binge eating disorder Feels binge eating is well controlled.  Also has ADHD.  Denies anxiety.  3. Other depression, with emotional eating Feels mood is stable.  She is seeing her therapist every 3-4 weeks.  Assessment/Plan:   1. Irregular periods She will follow-up with PCP about this issue or establish with a GYN.  2. Binge eating disorder I will have Dr. Earlene Plater fill Vyvanse at  40 mg daily.  3. Other depression, with emotional eating Refill Prozac 30 mg daily, as per below.  - Refill FLUoxetine (PROZAC) 10 MG capsule; Take 3 capsules (30 mg total) by mouth daily.  Dispense: 90 capsule; Refill: 0  4. Class 3 severe obesity with serious comorbidity and body mass index (BMI) of 45.0 to 49.9 in adult, unspecified obesity type [facility])  Cynthia [name] is currently in the action stage of change. As such, her goal is to continue with weight loss efforts. She has agreed to keeping a food journal and adhering to recommended goals of 1400 calories and 85-95 grams of protein.   Exercise goals: No exercise has been prescribed at this time.   I will have CMA check on coverage for Saxenda and Victoza.  Behavioral modification strategies: increasing lean protein intake, no skipping meals and keeping a strict food journal.  Cynthia [name] has agreed to follow-up with our clinic in 3 weeks with Dr. Earlene Plater.  Objective:   VITALS: Per patient if applicable, see vitals. GENERAL: Alert and in no acute distress. CARDIOPULMONARY: No increased WOB. Speaking in clear sentences.  PSYCH: Pleasant and cooperative. Speech normal rate and rhythm. Affect is appropriate. Insight and judgement are appropriate. Attention is focused, linear, and appropriate.  NEURO: Oriented as arrived to appointment on time with no prompting.   Lab Results  Component Value Date   CREATININE 0.84 06/29/2020   BUN 10 06/29/2020   NA 139 06/29/2020   K 4.3 06/29/2020   CL 104 06/29/2020   CO2 19 (L) 06/29/2020  Lab Results  Component Value Date   ALT 11 06/29/2020   AST 9 06/29/2020   ALKPHOS 81 06/29/2020   BILITOT 0.3 06/29/2020   Lab Results  Component Value Date   HGBA1C 5.3 06/29/2020   HGBA1C 5.4 06/29/2019   HGBA1C 5.4 10/30/2018   HGBA1C 5.5 01/16/2018   HGBA1C 5.4 10/03/2017   Lab Results  Component Value Date   INSULIN 13.7 06/29/2020   INSULIN 12.3 01/06/2020   INSULIN 17.7 06/29/2019   INSULIN  29.3 (H) 10/30/2018   INSULIN 12.9 01/16/2018   Lab Results  Component Value Date   TSH 1.460 06/29/2020   Lab Results  Component Value Date   CHOL 184 06/29/2020   HDL 57 06/29/2020   LDLCALC 115 (H) 06/29/2020   TRIG 63 06/29/2020   CHOLHDL 3.2 06/29/2020   Lab Results  Component Value Date   WBC 8.2 06/29/2020   HGB 14.3 06/29/2020   HCT 43.7 06/29/2020   MCV 89 06/29/2020   PLT 292 06/29/2020   Lab Results  Component Value Date   IRON 62 06/29/2020   TIBC 302 06/29/2020   FERRITIN 110 06/29/2020   Attestation Statements:   Reviewed by clinician on day of visit: allergies, medications, problem list, medical history, surgical history, family history, social history, and previous encounter notes.  I, Insurance claims handler, CMA, am acting as Energy manager for [place], FNP.  I have reviewed the above documentation for accuracy and completeness, and I agree with the above. - Jesse Sans, FNP

## 2020-11-15 ENCOUNTER — Ambulatory Visit (INDEPENDENT_AMBULATORY_CARE_PROVIDER_SITE_OTHER): Payer: BC Managed Care – PPO | Admitting: Family Medicine

## 2020-11-16 ENCOUNTER — Encounter (INDEPENDENT_AMBULATORY_CARE_PROVIDER_SITE_OTHER): Payer: Self-pay | Admitting: Family Medicine

## 2020-11-16 ENCOUNTER — Other Ambulatory Visit (INDEPENDENT_AMBULATORY_CARE_PROVIDER_SITE_OTHER): Payer: Self-pay | Admitting: Family Medicine

## 2020-11-16 ENCOUNTER — Telehealth (INDEPENDENT_AMBULATORY_CARE_PROVIDER_SITE_OTHER): Payer: BC Managed Care – PPO | Admitting: Family Medicine

## 2020-11-16 ENCOUNTER — Other Ambulatory Visit: Payer: Self-pay

## 2020-11-16 DIAGNOSIS — R5383 Other fatigue: Secondary | ICD-10-CM

## 2020-11-16 DIAGNOSIS — E8881 Metabolic syndrome: Secondary | ICD-10-CM

## 2020-11-16 DIAGNOSIS — R7303 Prediabetes: Secondary | ICD-10-CM

## 2020-11-16 DIAGNOSIS — F3289 Other specified depressive episodes: Secondary | ICD-10-CM

## 2020-11-16 DIAGNOSIS — E559 Vitamin D deficiency, unspecified: Secondary | ICD-10-CM

## 2020-11-16 DIAGNOSIS — R5381 Other malaise: Secondary | ICD-10-CM

## 2020-11-16 DIAGNOSIS — M2550 Pain in unspecified joint: Secondary | ICD-10-CM

## 2020-11-16 DIAGNOSIS — Z6841 Body Mass Index (BMI) 40.0 and over, adult: Secondary | ICD-10-CM

## 2020-11-16 NOTE — Progress Notes (Signed)
TeleHealth Visit:  Due to the COVID-19 pandemic, this visit was completed with telemedicine (audio/video) technology to reduce patient and provider exposure as well as to preserve personal protective equipment.   Cynthia [name] has verbally consented to this TeleHealth visit. The patient is located at home, the provider is located at the [facility] office. The participants in this visit include the listed provider and patient. The visit was conducted today via MyChart video.  OBESITY Cynthia [name] is here to discuss her progress with her obesity treatment plan along with follow-up of her obesity related diagnoses.   Chief Complaint  Patient presents with  . Follow-up    Journaling 1400 calories 85-95 grams of protein 30% of the time. No current exercise. Weighed at home 04/26 256lbs. Will need a refill on Prozac. Reports increased fatigue and decreased sleep.    Assessment/Plan:   1. Other depression, with emotional eating Worsening.  Motivational interviewing as well as evidence-based interventions for health behavior change were utilized today including the discussion of self monitoring techniques, problem-solving barriers and SMART goal setting techniques.  Specifically regarding patient's less desirable eating habits and patterns, we employed the technique of small changes when she cannot fully commit to her prudent nutritional plan. Provided psychoeducation and facilitated discussion regarding eating as habit, self-monitoring, stimulus control, regulating eating pattern, coping skills, and barriers to success.   - FLUoxetine (PROZAC) 10 MG capsule; Take 3 capsules (30 mg total) by mouth daily.  Dispense: 90 capsule; Refill: 0  2. Prediabetes At goal. Goal is HgbA1c < 5.7.  She will continue to focus on protein-rich, low simple carbohydrate foods. We reviewed the importance of hydration, regular exercise for stress reduction, and restorative sleep.   - Insulin, random - Hemoglobin  A1c  3. Malaise and fatigue Labs ordered for patient to come to have drawn last week.   - Anemia panel - CBC with Differential/Platelet - Comprehensive metabolic panel - TSH - T4, free  4. Vitamin D deficiency Labs ordered.   - VITAMIN D 25 Hydroxy (Vit-D Deficiency, Fractures)  5. Metabolic syndrome Starting goal: Lose 7-10% of starting weight. She will continue to focus on protein-rich, low simple carbohydrate foods. We reviewed the importance of hydration, regular exercise for stress reduction, and restorative sleep.  We will continue to check lab work every 3 months, with 10% weight loss, or should any other concerns arise.  - Lipid panel  6. Multiple joint pain Worsening. Labs ordered per patient request.   - Uric acid - Sed Rate (ESR) - C-reactive protein  7. Obesity, current BMI 46.46 Cynthia [name] is currently in the action stage of change. As such, her goal is to continue with weight loss efforts. She has agreed to keeping a food journal and adhering to recommended goals of 1400 calories and 85 protein.   Exercise goals: All adults should avoid inactivity. Some physical activity is better than none, and adults who participate in any amount of physical activity gain some health benefits. Adults should also include muscle-strengthening activities that involve all major muscle groups on 2 or more days a week.  Behavioral modification strategies: increasing lean protein intake, decreasing simple carbohydrates, increasing vegetables, decreasing eating out, meal planning and cooking strategies and emotional eating strategies.  Cynthia [name] has agreed to follow-up with our clinic in 2 weeks. She was informed of the importance of frequent follow-up visits to maximize her success with intensive lifestyle modifications for her multiple health conditions.  Objective:   VITALS: Per patient if  applicable, see vitals. GENERAL: Alert and in no acute distress. CARDIOPULMONARY: No increased WOB.  Speaking in clear sentences.  PSYCH: Pleasant and cooperative. Speech normal rate and rhythm. Affect is appropriate. Insight and judgement are appropriate. Attention is focused, linear, and appropriate.  NEURO: Oriented as arrived to appointment on time with no prompting.   Lab Results  Component Value Date   CREATININE 0.84 06/29/2020   BUN 10 06/29/2020   NA 139 06/29/2020   K 4.3 06/29/2020   CL 104 06/29/2020   CO2 19 (L) 06/29/2020   Lab Results  Component Value Date   ALT 11 06/29/2020   AST 9 06/29/2020   ALKPHOS 81 06/29/2020   BILITOT 0.3 06/29/2020   Lab Results  Component Value Date   HGBA1C 5.3 06/29/2020   HGBA1C 5.4 06/29/2019   HGBA1C 5.4 10/30/2018   HGBA1C 5.5 01/16/2018   HGBA1C 5.4 10/03/2017   Lab Results  Component Value Date   INSULIN 13.7 06/29/2020   INSULIN 12.3 01/06/2020   INSULIN 17.7 06/29/2019   INSULIN 29.3 (H) 10/30/2018   INSULIN 12.9 01/16/2018   Lab Results  Component Value Date   TSH 1.460 06/29/2020   Lab Results  Component Value Date   CHOL 184 06/29/2020   HDL 57 06/29/2020   LDLCALC 115 (H) 06/29/2020   TRIG 63 06/29/2020   CHOLHDL 3.2 06/29/2020   Lab Results  Component Value Date   WBC 8.2 06/29/2020   HGB 14.3 06/29/2020   HCT 43.7 06/29/2020   MCV 89 06/29/2020   PLT 292 06/29/2020   Lab Results  Component Value Date   IRON 62 06/29/2020   TIBC 302 06/29/2020   FERRITIN 110 06/29/2020   Attestation Statements:   Reviewed by clinician on day of visit: allergies, medications, problem list, medical history, surgical history, family history, social history, and previous encounter notes.

## 2020-11-17 NOTE — Telephone Encounter (Signed)
Dr.Wallace °

## 2020-11-19 MED ORDER — FLUOXETINE HCL 10 MG PO CAPS: 30.0000 mg | ORAL_CAPSULE | Freq: Every day | ORAL | 0 refills | Status: DC

## 2020-12-07 ENCOUNTER — Ambulatory Visit (INDEPENDENT_AMBULATORY_CARE_PROVIDER_SITE_OTHER): Payer: BC Managed Care – PPO | Admitting: Family Medicine

## 2020-12-07 ENCOUNTER — Encounter (INDEPENDENT_AMBULATORY_CARE_PROVIDER_SITE_OTHER): Payer: Self-pay | Admitting: Family Medicine

## 2020-12-07 ENCOUNTER — Other Ambulatory Visit: Payer: Self-pay

## 2020-12-07 VITALS — BP 126/84 | HR 105 | Temp 98.6°F | Ht 62.0 in | Wt 259.0 lb

## 2020-12-07 DIAGNOSIS — F3289 Other specified depressive episodes: Secondary | ICD-10-CM

## 2020-12-07 DIAGNOSIS — Z6841 Body Mass Index (BMI) 40.0 and over, adult: Secondary | ICD-10-CM

## 2020-12-07 DIAGNOSIS — Z9189 Other specified personal risk factors, not elsewhere classified: Secondary | ICD-10-CM | POA: Diagnosis not present

## 2020-12-07 DIAGNOSIS — F5081 Binge eating disorder: Secondary | ICD-10-CM | POA: Diagnosis not present

## 2020-12-07 DIAGNOSIS — R5383 Other fatigue: Secondary | ICD-10-CM | POA: Diagnosis not present

## 2020-12-07 DIAGNOSIS — E8881 Metabolic syndrome: Secondary | ICD-10-CM | POA: Diagnosis not present

## 2020-12-07 DIAGNOSIS — R7303 Prediabetes: Secondary | ICD-10-CM | POA: Diagnosis not present

## 2020-12-07 DIAGNOSIS — E559 Vitamin D deficiency, unspecified: Secondary | ICD-10-CM

## 2020-12-07 DIAGNOSIS — R5381 Other malaise: Secondary | ICD-10-CM | POA: Diagnosis not present

## 2020-12-07 DIAGNOSIS — M2550 Pain in unspecified joint: Secondary | ICD-10-CM | POA: Diagnosis not present

## 2020-12-07 NOTE — Progress Notes (Signed)
Chief Complaint:   OBESITY Cynthia [name] is here to discuss her progress with her obesity treatment plan along with follow-up of her obesity related diagnoses.   Today's visit was #: 59 Starting weight: 264 lbs Starting date: 05/01/2017 Total lbs lost to date: 5 lbs Total weight loss percentage to date: -1.89%  Interim History: Cynthia [name] has been off [place] for 10 days.  Her weight has increased significantly.   Nutrition Plan: keeping a food journal and adhering to recommended goals of 1400 calories and 85-95 grams of protein for 70% of the time. Activity: None  Assessment/Plan:   1. Vitamin D deficiency Not at goal. Current vitamin D is 29.3, tested on 06/29/2020. Optimal goal > 50 ng/dL.  She is taking vitamin D 50,000 IU weekly.  Plan: Continue to take prescription Vitamin D @50 ,000 IU every week as prescribed.  Follow-up for routine testing of Vitamin D, at least 2-3 times per year to avoid over-replacement.  - Refill Vitamin D, Ergocalciferol, (DRISDOL) 1.25 MG (50000 UNIT) CAPS capsule; Take 1 capsule (50,000 Units total) by mouth every 7 (seven) days.  Dispense: 4 capsule; Refill: 0  2. Binge eating disorder Cynthia [name] is taking Vyvanse 40 mg daily for BED.  Plan:  Continue Vyvanse 40 mg daily.  Will refill today, as per below.  People who binge eat feel as if they don't have control over how much they eat and have feelings of guilt or self-loathing after a binge eating episode. [employer] estimates that about 30 percent of adults with binge eating disorder also have a history of ADHD. The FDA has approved Vyvanse as a treatment option for both ADHD and binge eating. Vyvanse targets the brain's reward center by increasing the levels of dopamine and norepinephrine, the chemicals of the brain responsible for feelings of pleasure. Mindful eating is the recommended nutritional approach to treating BED.   I have consulted the [place] Controlled Substances Registry for this patient, and feel  the risk/benefit ratio today is favorable for proceeding with this prescription for a controlled substance. The patient understands monitoring parameters and red flags.   - Refill Lisdexamfetamine Dimesylate (VYVANSE) 40 MG CHEW; Chew 40 mg by mouth daily.  Dispense: 30 tablet; Refill: 0  3. Other depression, with emotional eating Not at goal. Medication: Prozac 30 mg daily.  Plan:  Continue Prozac.  Will refill today. Behavior modification techniques were discussed today to help deal with emotional/non-hunger eating behaviors.  - Refill FLUoxetine (PROZAC) 10 MG capsule; Take 3 capsules (30 mg total) by mouth daily.  Dispense: 90 capsule; Refill: 0  4. At risk for activity intolerance Cynthia [name] was given approximately 8 minutes of counseling today regarding her increased risk for exercise intolerance.  We discussed patient's specific personal and medical issues that raise our concern.  She was advised of strategies to prevent injury and ways to improve her cardiopulmonary fitness levels slowly over time.  We additionally discussed various fitness trackers and smart phone apps to help motivate the patient to stay on track.   5. Obesity, current BMI 47.4  Course: Cynthia [name] is currently in the action stage of change. As such, her goal is to continue with weight loss efforts.   Nutrition goals: She has agreed to keeping a food journal and adhering to recommended goals of 1400 calories and 85 grams of protein.   Exercise goals: For substantial health benefits, adults should do at least 150 minutes (2 hours and 30 minutes) a week of moderate-intensity, or 75 minutes (  1 hour and 15 minutes) a week of vigorous-intensity aerobic physical activity, or an equivalent combination of moderate- and vigorous-intensity aerobic activity. Aerobic activity should be performed in episodes of at least 10 minutes, and preferably, it should be spread throughout the week.  Behavioral modification strategies: increasing lean  protein intake, decreasing simple carbohydrates, increasing vegetables and increasing water intake.  Cynthia [name] has agreed to follow-up with our clinic in 4 weeks. She was informed of the importance of frequent follow-up visits to maximize her success with intensive lifestyle modifications for her multiple health conditions.   Objective:   Blood pressure 126/84, pulse (!) 105, temperature 98.6 F (37 C), temperature source Oral, SpO2 96 %. There is no height or weight on file to calculate BMI.  General: Cooperative, alert, well developed, in no acute distress. HEENT: Conjunctivae and lids unremarkable. Cardiovascular: Regular rhythm.  Lungs: Normal work of breathing. Neurologic: No focal deficits.   Lab Results  Component Value Date   CREATININE 0.84 06/29/2020   BUN 10 06/29/2020   NA 139 06/29/2020   K 4.3 06/29/2020   CL 104 06/29/2020   CO2 19 (L) 06/29/2020   Lab Results  Component Value Date   ALT 11 06/29/2020   AST 9 06/29/2020   ALKPHOS 81 06/29/2020   BILITOT 0.3 06/29/2020   Lab Results  Component Value Date   HGBA1C 5.3 06/29/2020   HGBA1C 5.4 06/29/2019   HGBA1C 5.4 10/30/2018   HGBA1C 5.5 01/16/2018   HGBA1C 5.4 10/03/2017   Lab Results  Component Value Date   INSULIN 13.7 06/29/2020   INSULIN 12.3 01/06/2020   INSULIN 17.7 06/29/2019   INSULIN 29.3 (H) 10/30/2018   INSULIN 12.9 01/16/2018   Lab Results  Component Value Date   TSH 1.460 06/29/2020   Lab Results  Component Value Date   CHOL 184 06/29/2020   HDL 57 06/29/2020   LDLCALC 115 (H) 06/29/2020   TRIG 63 06/29/2020   CHOLHDL 3.2 06/29/2020   Lab Results  Component Value Date   WBC 8.2 06/29/2020   HGB 14.3 06/29/2020   HCT 43.7 06/29/2020   MCV 89 06/29/2020   PLT 292 06/29/2020   Lab Results  Component Value Date   IRON 62 06/29/2020   TIBC 302 06/29/2020   FERRITIN 110 06/29/2020   Attestation Statements:   Reviewed by clinician on day of visit: allergies, medications,  problem list, medical history, surgical history, family history, social history, and previous encounter notes.  I, Insurance claims handler, CMA, am acting as transcriptionist for Helane Rima, DO  I have reviewed the above documentation for accuracy and completeness, and I agree with the above. Helane Rima, DO

## 2020-12-08 LAB — COMPREHENSIVE METABOLIC PANEL
ALT: 10 IU/L (ref 0–32)
AST: 13 IU/L (ref 0–40)
Albumin/Globulin Ratio: 1.5 (ref 1.2–2.2)
Albumin: 4 g/dL (ref 3.8–4.8)
Alkaline Phosphatase: 86 IU/L (ref 44–121)
BUN/Creatinine Ratio: 23 (ref 9–23)
BUN: 17 mg/dL (ref 6–20)
Bilirubin Total: 0.2 mg/dL (ref 0.0–1.2)
CO2: 18 mmol/L — ABNORMAL LOW (ref 20–29)
Calcium: 9 mg/dL (ref 8.7–10.2)
Chloride: 102 mmol/L (ref 96–106)
Creatinine, Ser: 0.73 mg/dL (ref 0.57–1.00)
Globulin, Total: 2.6 g/dL (ref 1.5–4.5)
Glucose: 80 mg/dL (ref 65–99)
Potassium: 4.4 mmol/L (ref 3.5–5.2)
Sodium: 138 mmol/L (ref 134–144)
Total Protein: 6.6 g/dL (ref 6.0–8.5)
eGFR: 111 mL/min/{1.73_m2} (ref >59)

## 2020-12-08 LAB — TSH: TSH: 1.61 u[IU]/mL (ref 0.450–4.500)

## 2020-12-08 LAB — CBC WITH DIFFERENTIAL/PLATELET
Basophils Absolute: 0.1 10*3/uL (ref 0.0–0.2)
Basos: 1 %
EOS (ABSOLUTE): 0.1 10*3/uL (ref 0.0–0.4)
Eos: 1 %
Hemoglobin: 13.6 g/dL (ref 11.1–15.9)
Immature Grans (Abs): 0.1 10*3/uL (ref 0.0–0.1)
Immature Granulocytes: 1 %
Lymphocytes Absolute: 2.5 10*3/uL (ref 0.7–3.1)
Lymphs: 25 %
MCH: 28.8 pg (ref 26.6–33.0)
MCHC: 32.2 g/dL (ref 31.5–35.7)
MCV: 89 fL (ref 79–97)
Monocytes Absolute: 0.7 10*3/uL (ref 0.1–0.9)
Monocytes: 7 %
Neutrophils Absolute: 6.6 10*3/uL (ref 1.4–7.0)
Neutrophils: 65 %
Platelets: 278 10*3/uL (ref 150–450)
RBC: 4.72 x10E6/uL (ref 3.77–5.28)
RDW: 13.2 % (ref 11.7–15.4)
WBC: 9.9 10*3/uL (ref 3.4–10.8)

## 2020-12-08 LAB — LIPID PANEL
Chol/HDL Ratio: 3 ratio (ref 0.0–4.4)
Cholesterol, Total: 166 mg/dL (ref 100–199)
HDL: 55 mg/dL (ref >39)
LDL Chol Calc (NIH): 99 mg/dL (ref 0–99)
Triglycerides: 59 mg/dL (ref 0–149)
VLDL Cholesterol Cal: 12 mg/dL (ref 5–40)

## 2020-12-08 LAB — HEMOGLOBIN A1C
Est. average glucose Bld gHb Est-mCnc: 111 mg/dL
Hgb A1c MFr Bld: 5.5 % (ref 4.8–5.6)

## 2020-12-08 LAB — ANEMIA PANEL
Ferritin: 94 ng/mL (ref 15–150)
Folate, Hemolysate: 322 ng/mL
Folate, RBC: 763 ng/mL (ref >498)
Hematocrit: 42.2 % (ref 34.0–46.6)
Iron Saturation: 20 % (ref 15–55)
Iron: 60 ug/dL (ref 27–159)
Retic Ct Pct: 1.7 % (ref 0.6–2.6)
Total Iron Binding Capacity: 301 ug/dL (ref 250–450)
UIBC: 241 ug/dL (ref 131–425)
Vitamin B-12: 297 pg/mL (ref 232–1245)

## 2020-12-08 LAB — INSULIN, RANDOM: INSULIN: 10.3 u[IU]/mL (ref 2.6–24.9)

## 2020-12-08 LAB — T4, FREE: Free T4: 1.18 ng/dL (ref 0.82–1.77)

## 2020-12-08 LAB — URIC ACID: Uric Acid: 3.6 mg/dL (ref 2.6–6.2)

## 2020-12-08 LAB — SEDIMENTATION RATE: Sed Rate: 23 mm/hr (ref 0–32)

## 2020-12-08 LAB — C-REACTIVE PROTEIN: CRP: 23 mg/L — ABNORMAL HIGH (ref 0–10)

## 2020-12-08 LAB — VITAMIN D 25 HYDROXY (VIT D DEFICIENCY, FRACTURES): Vit D, 25-Hydroxy: 23.7 ng/mL — ABNORMAL LOW (ref 30.0–100.0)

## 2020-12-12 MED ORDER — VYVANSE 40 MG PO CHEW: 40.0000 mg | CHEWABLE_TABLET | Freq: Every day | ORAL | 0 refills | Status: DC

## 2020-12-12 MED ORDER — FLUOXETINE HCL 10 MG PO CAPS: 30.0000 mg | ORAL_CAPSULE | Freq: Every day | ORAL | 0 refills | Status: DC

## 2020-12-12 MED ORDER — VITAMIN D (ERGOCALCIFEROL) 1.25 MG (50000 UNIT) PO CAPS: 50000.0000 [IU] | ORAL_CAPSULE | ORAL | 0 refills | Status: DC

## 2020-12-20 ENCOUNTER — Other Ambulatory Visit: Payer: Self-pay

## 2020-12-20 ENCOUNTER — Ambulatory Visit: Payer: BC Managed Care – PPO | Admitting: Allergy & Immunology

## 2020-12-20 ENCOUNTER — Encounter: Payer: Self-pay | Admitting: Allergy & Immunology

## 2020-12-20 VITALS — BP 122/82 | HR 110 | Temp 97.7°F | Resp 18 | Ht 62.0 in | Wt 264.4 lb

## 2020-12-20 DIAGNOSIS — J302 Other seasonal allergic rhinitis: Secondary | ICD-10-CM | POA: Diagnosis not present

## 2020-12-20 DIAGNOSIS — J4540 Moderate persistent asthma, uncomplicated: Secondary | ICD-10-CM

## 2020-12-20 DIAGNOSIS — J3089 Other allergic rhinitis: Secondary | ICD-10-CM

## 2020-12-20 MED ORDER — MONTELUKAST SODIUM 10 MG PO TABS: ORAL_TABLET | ORAL | 1 refills | Status: DC

## 2020-12-20 MED ORDER — ALBUTEROL SULFATE HFA 108 (90 BASE) MCG/ACT IN AERS: 1.0000 | INHALATION_SPRAY | Freq: Four times a day (QID) | RESPIRATORY_TRACT | 1 refills | Status: DC | PRN

## 2020-12-20 NOTE — Patient Instructions (Addendum)
1. Intermittent asthma, uncomplicated - Lung testing looks great today.   - I am not convinced that you have asthma and I am glad you were able to get off the inhalers.  - Daily controller medication(s): NOTHING - Prior to physical activity: albuterol 2 puffs 10-15 minutes before physical activity. - Rescue medications: albuterol 4 puffs every 4-6 hours as needed - Asthma control goals:  * Full participation in all desired activities (may need albuterol before activity) * Albuterol use two time or less a week on average (not counting use with activity) * Cough interfering with sleep two time or less a month * Oral steroids no more than once a year * No hospitalizations  2. Seasonal and perennial allergic rhinitis - Continue with Qnasl as needed during particularly bad weeks.  - Continue with carbinoxamine 6mg  as needed.  - Continue with alternating Xyzal 5mg  and Allegra 180mg  once daily (you can use one twice daily).   3. Return in about 1 year (around 12/20/2021).    Please inform of any Emergency Department visits, hospitalizations, or changes in symptoms. Call before going to the ED for breathing or allergy symptoms since we might be able to fit you in for a sick visit. Feel free to contact 12/22/2021 anytime with any questions, problems, or concerns.  It was a pleasure to meet you today!  Websites that have reliable patient information: 1. American Academy of Asthma, Allergy, and Immunology: www.aaaai.org 2. Food Allergy Research and Education (FARE): foodallergy.org 3. Mothers of Asthmatics: http://www.asthmacommunitynetwork.org 4. American College of Allergy, Asthma, and Immunology: www.acaai.org   COVID-19 Vaccine Information can be found at: [place] For questions related to vaccine distribution or appointments, please email [email or web address] or call [phone].     "Like" [place] on Facebook and Instagram  for our latest updates!      Make sure you are registered to vote! If you have moved or changed any of your contact information, you will need to get this updated before voting!  In some cases, you MAY be able to register to vote online: [email or web address]

## 2020-12-20 NOTE — Progress Notes (Signed)
FOLLOW UP  Date of Service/Encounter:  12/20/20   Assessment:   Moderate persistent asthma, uncomplicated   Seasonal and perennial allergic rhinitis (grasses, weeds, trees, ragweed, indoor/outdoor molds)  Bilateral knee osteoarthritis - pursuing PRP and weight loss  Plan/Recommendations:   1. Intermittent asthma, uncomplicated - Lung testing looks great today.   - I am not convinced that you have asthma and I am glad you were able to get off the inhalers.  - Daily controller medication(s): NOTHING - Prior to physical activity: albuterol 2 puffs 10-15 minutes before physical activity. - Rescue medications: albuterol 4 puffs every 4-6 hours as needed - Asthma control goals:  * Full participation in all desired activities (may need albuterol before activity) * Albuterol use two time or less a week on average (not counting use with activity) * Cough interfering with sleep two time or less a month * Oral steroids no more than once a year * No hospitalizations  2. Seasonal and perennial allergic rhinitis - Continue with Qnasl as needed during particularly bad weeks.  - Continue with carbinoxamine 6mg  as needed.  - Continue with alternating Xyzal 5mg  and Allegra 180mg  once daily (you can use one twice daily).   3. Return in about 1 year (around 12/20/2021).    Subjective:   Cynthia [name] is a 35 y.o. female presenting today for follow up of  Chief Complaint  Patient presents with  . Asthma    Cynthia [name] has a history of the following: Patient Active Problem List   Diagnosis Date Noted  . Attention deficit disorder (ADD) without hyperactivity 09/17/2019  . Primary osteoarthritis of both knees 08/25/2019  . Anxiety 06/04/2019  . DDD (degenerative disc disease), cervical 06/03/2018  . Degeneration of lumbar intervertebral disc 06/03/2018  . Seasonal and perennial allergic rhinitis 05/05/2018  . Moderate persistent asthma 05/05/2018  . S/P arthroscopy of knee  04/11/2018  . Prediabetes 06/11/2017  . Vitamin D deficiency 06/11/2017  . Class 3 severe obesity with serious comorbidity and body mass index (BMI) of 45.0 to 49.9 in adult (HCC) 01/09/2012  . Asthma     History obtained from: chart review and patient.  Cynthia [name] is a 34 y.o. female presenting for a follow up visit.  She was last seen in January 2021.  At that time, her lung testing looked great.  We stopped her Breo and stepdown to Arnuity 200 mcg 1 puff once daily.  We continue with albuterol as needed.  For her allergic rhinitis, we stopped her Qnasl and started Xhance 1 spray per nostril daily.  We continue with carbinoxamine as needed and Xyzal 5 mg daily.  Since the last visit, she has done well.   Asthma/Respiratory Symptom History: She has not been taking Arnuity at all. She took it for three months or so and then did not notice a difference. She noticed no improvement even without the Breo or Arnuity on board. She is not having any wheezing or SOB or difficulty keeping up with her physical activities. She does not sit around and wheeze throughout the day. She does have some issues when she is outdoors during her peak season. She does not get SOB, but instead reports raspiness. Her initial flirtation with asthma was when she was sick for 7 months after having pneumonia and pleurisy. It was never entirely clear, however. She does have some EIB, however.    Allergic Rhinitis Symptom History: She is just trying to avoid the outdoors. She enjoys the outdoors  so this is difficult for her. She avoids everything if possible. She is using Xyzal rotated with Allegra. The carbinoxamine is still there, but she takes it only PRN. It tends to knock her out. She has Qnasl that she uses only on bad weeks or so.   She is going back to [employer] to have PRP pursued. It is not covered by insurance, unfortunately. She had both knees done one year ago and did not feel any improvement for three months.  She is also  looking into getting stem cell treatments done to help with her knee pain.  This is not FDA approved either and unfortunately she did not qualify for the clinical trials.  She is now off of disability for her back pain. She never got approved through SSI. She had private disability insurance.  She has lost some weight since we last saw her, around 45 pounds.  She has thought about gastric bypass but is not completely sold on the idea.  She is on Saxenda right now, which unfortunately is causing her to retain a lot of water.  Otherwise, there have been no changes to her past medical history, surgical history, family history, or social history.    Review of Systems  Constitutional: Negative.  Negative for chills, fever, malaise/fatigue and weight loss.  HENT: Negative for congestion, ear discharge, ear pain and sinus pain.   Eyes: Negative for pain, discharge and redness.  Respiratory: Negative for cough, sputum production, shortness of breath and wheezing.   Cardiovascular: Negative.  Negative for chest pain and palpitations.  Gastrointestinal: Negative for abdominal pain, constipation, diarrhea, heartburn, nausea and vomiting.  Skin: Negative.  Negative for itching and rash.  Neurological: Negative for dizziness and headaches.  Endo/Heme/Allergies: Positive for environmental allergies. Does not bruise/bleed easily.       Objective:   Blood pressure 122/82, pulse (!) 110, temperature 97.7 F (36.5 C), temperature source Temporal, resp. rate 18, height 5\' 2"  (1.575 m), weight 264 lb 6.4 oz (119.9 kg), SpO2 96 %. Body mass index is 48.36 kg/m.   Physical Exam:  Physical Exam Constitutional:      Appearance: She is well-developed. She is obese.     Comments: She has lost a lot of weight since I last saw her.  HENT:     Head: Normocephalic and atraumatic.     Right Ear: Tympanic membrane, ear canal and external ear normal.     Left Ear: Tympanic membrane, ear canal and external ear  normal.     Nose: No nasal deformity, septal deviation, mucosal edema or rhinorrhea.     Right Turbinates: Enlarged and swollen.     Left Turbinates: Enlarged and swollen.     Right Sinus: No maxillary sinus tenderness or frontal sinus tenderness.     Left Sinus: No maxillary sinus tenderness or frontal sinus tenderness.     Mouth/Throat:     Mouth: Mucous membranes are not pale and not dry.     Pharynx: Uvula midline.  Eyes:     General:        Right eye: No discharge.        Left eye: No discharge.     Conjunctiva/sclera: Conjunctivae normal.     Right eye: Right conjunctiva is not injected. No chemosis.    Left eye: Left conjunctiva is not injected. No chemosis.    Pupils: Pupils are equal, round, and reactive to light.  Cardiovascular:     Rate and Rhythm: Normal rate and regular  rhythm.     Heart sounds: Normal heart sounds.  Pulmonary:     Effort: Pulmonary effort is normal. No tachypnea, accessory muscle usage or respiratory distress.     Breath sounds: Normal breath sounds. No wheezing, rhonchi or rales.     Comments: Moving air well in all lung fields.  No increased work of breathing. Chest:     Chest wall: No tenderness.  Lymphadenopathy:     Cervical: No cervical adenopathy.  Skin:    Coloration: Skin is not pale.     Findings: No abrasion, erythema, petechiae or rash. Rash is not papular, urticarial or vesicular.  Neurological:     Mental Status: She is alert.      Diagnostic studies:    Spirometry: results normal (FEV1: 2.49/82%, FVC: 3.24/89%, FEV1/FVC: 77%).    Spirometry consistent with normal pattern.   Allergy Studies: none       Malachi Bonds, MD  [facility]

## 2020-12-21 LAB — SARS-COV-2 SPIKE AB DILUTION: SARS-CoV-2 Spike Ab Dilution: >25000 U/mL (ref <0.8)

## 2020-12-21 LAB — SARS-COV-2 SEMI-QUANTITATIVE TOTAL ANTIBODY, SPIKE: SARS-CoV-2 Spike Ab Interp: POSITIVE

## 2020-12-28 ENCOUNTER — Other Ambulatory Visit: Payer: Self-pay

## 2020-12-28 ENCOUNTER — Encounter (INDEPENDENT_AMBULATORY_CARE_PROVIDER_SITE_OTHER): Payer: Self-pay | Admitting: Family Medicine

## 2020-12-28 ENCOUNTER — Ambulatory Visit (INDEPENDENT_AMBULATORY_CARE_PROVIDER_SITE_OTHER): Payer: BC Managed Care – PPO | Admitting: Family Medicine

## 2020-12-28 VITALS — BP 118/80 | HR 151 | Temp 98.0°F | Ht 62.0 in | Wt 261.0 lb

## 2020-12-28 DIAGNOSIS — Z6841 Body Mass Index (BMI) 40.0 and over, adult: Secondary | ICD-10-CM

## 2020-12-28 DIAGNOSIS — F3289 Other specified depressive episodes: Secondary | ICD-10-CM

## 2020-12-28 DIAGNOSIS — E8881 Metabolic syndrome: Secondary | ICD-10-CM | POA: Diagnosis not present

## 2020-12-28 DIAGNOSIS — E559 Vitamin D deficiency, unspecified: Secondary | ICD-10-CM

## 2020-12-28 DIAGNOSIS — F5081 Binge eating disorder: Secondary | ICD-10-CM

## 2020-12-28 DIAGNOSIS — Z9189 Other specified personal risk factors, not elsewhere classified: Secondary | ICD-10-CM

## 2020-12-28 DIAGNOSIS — E538 Deficiency of other specified B group vitamins: Secondary | ICD-10-CM | POA: Diagnosis not present

## 2020-12-28 MED ORDER — VYVANSE 40 MG PO CHEW: 40.0000 mg | CHEWABLE_TABLET | Freq: Every day | ORAL | 0 refills | Status: DC

## 2020-12-28 MED ORDER — FLUOXETINE HCL 20 MG PO TABS: 30.0000 mg | ORAL_TABLET | Freq: Every day | ORAL | 3 refills | Status: DC

## 2020-12-28 MED ORDER — VITAMIN D (ERGOCALCIFEROL) 1.25 MG (50000 UNIT) PO CAPS: 50000.0000 [IU] | ORAL_CAPSULE | ORAL | 0 refills | Status: DC

## 2021-01-09 NOTE — Progress Notes (Signed)
Chief Complaint:   OBESITY Cynthia [name] is here to discuss her progress with her obesity treatment plan along with follow-up of her obesity related diagnoses.   Today's visit was #: 60 Starting weight: 264 lbs Starting date: 05/01/2017 Today's weight: 261 lbs Today's date: 01/09/2021 Weight change since last visit: +2 lbs Total lbs lost to date: 3 lbs Body mass index is 47.74 kg/m.  Total weight loss percentage to date: -1.14%  Interim History:  Cynthia [name] has been on Saxanda 1.8 mg daily for a few weeks.  She is also taking Vyvanse 40 mg daily.  Her pulse was 78 during her visit.  Current Meal Plan: keeping a food journal and adhering to recommended goals of 1400 calories and 85 grams of protein for 80% of the time.  Current Exercise Plan: None at this time. Current Anti-Obesity Medications: Saxenda 1.8 mg subcutaneously daily. Side effects: None.  Assessment/Plan:   Meds ordered this encounter  Medications   Lisdexamfetamine Dimesylate (VYVANSE) 40 MG CHEW    Sig: Chew 40 mg by mouth daily.    Dispense:  30 tablet    Refill:  0   Vitamin D, Ergocalciferol, (DRISDOL) 1.25 MG (50000 UNIT) CAPS capsule    Sig: Take 1 capsule (50,000 Units total) by mouth every 7 (seven) days.    Dispense:  4 capsule    Refill:  0   FLUoxetine (PROZAC) 20 MG tablet    Sig: Take 1.5 tablets (30 mg total) by mouth daily.    Dispense:  90 tablet    Refill:  3    1. Insulin resistance Improved.  Goal is HgbA1c < 5.7, fasting insulin closer to 5.  Plan:  She will continue to focus on protein-rich, low simple carbohydrate foods. We reviewed the importance of hydration, regular exercise for stress reduction, and restorative sleep.   Lab Results  Component Value Date   HGBA1C 5.5 12/07/2020   Lab Results  Component Value Date   INSULIN 10.3 12/07/2020   INSULIN 13.7 06/29/2020   INSULIN 12.3 01/06/2020   INSULIN 17.7 06/29/2019   INSULIN 29.3 (H) 10/30/2018   2. B12 deficiency Lab Results   Component Value Date   VITAMINB12 297 12/07/2020   Supplementation:  None.  We will continue to monitor as it pertains to her weight loss journey.  3. Vitamin D deficiency Not at goal. Current vitamin D is 23.7, tested on 12/07/2020. Optimal goal > 50 ng/dL.  She is taking vitamin D 50,000 IU weekly.  Plan: Continue to take prescription Vitamin D @50 ,000 IU every week as prescribed.  Follow-up for routine testing of Vitamin D, at least 2-3 times per year to avoid over-replacement.  - Refill Vitamin D, Ergocalciferol, (DRISDOL) 1.25 MG (50000 UNIT) CAPS capsule; Take 1 capsule (50,000 Units total) by mouth every 7 (seven) days.  Dispense: 4 capsule; Refill: 0  4. Binge eating disorder Cynthia [name] is taking Vyvanse 40 mg daily.  Will refill Vyvanse today, as per below.  People who binge eat feel as if they don't have control over how much they eat and have feelings of guilt or self-loathing after a binge eating episode. Cynthia [name] estimates that about 30 percent of adults with binge eating disorder also have a history of ADHD. The FDA has approved Vyvanse as a treatment option for both ADHD and binge eating. Vyvanse targets the brain's reward center by increasing the levels of dopamine and norepinephrine, the chemicals of the brain responsible for feelings of pleasure. Mindful eating  is the recommended nutritional approach to treating BED.   - Refill Lisdexamfetamine Dimesylate (VYVANSE) 40 MG CHEW; Chew 40 mg by mouth daily.  Dispense: 30 tablet; Refill: 0  I have consulted the [place] Controlled Substances Registry for this patient, and feel the risk/benefit ratio today is favorable for proceeding with this prescription for a controlled substance. The patient understands monitoring parameters and red flags.   5. Other depression, with emotional eating Improving, but not optimized. Medication: Prozac 30 mg daily.  Plan:  Discussed cues and consequences, how thoughts affect eating, model of  thoughts, feelings, and behaviors, and strategies for change by focusing on the cue. Discussed cognitive distortions, coping thoughts, and how to change your thoughts. Behavior modification techniques were discussed today to help deal with emotional/non-hunger eating behaviors.  - Refill FLUoxetine (PROZAC) 20 MG tablet; Take 1.5 tablets (30 mg total) by mouth daily.  Dispense: 90 tablet; Refill: 3  6. At risk for heart disease Due to Cynthia [name]'s current state of health and medical condition(s), she is at a higher risk for heart disease.  This puts the patient at much greater risk to subsequently develop cardiopulmonary conditions that can significantly affect patient's quality of life in a negative manner.    At least 8 minutes were spent on counseling Cynthia [name] about these concerns today. Evidence-based interventions for health behavior change were utilized today including the discussion of self monitoring techniques, problem-solving barriers, and SMART goal setting techniques.  Specifically, regarding patient's less desirable eating habits and patterns, we employed the technique of small changes when Cynthia [name] has not been able to fully commit to her prudent nutritional plan.  7. Obesity, current BMI 47.8  Course: Cynthia [name] is currently in the action stage of change. As such, her goal is to continue with weight loss efforts.   Nutrition goals: She has agreed to keeping a food journal and adhering to recommended goals of 1400 calories and 85 grams of protein.   Exercise goals: All adults should avoid inactivity. Some physical activity is better than none, and adults who participate in any amount of physical activity gain some health benefits.  Behavioral modification strategies: increasing lean protein intake, decreasing simple carbohydrates, increasing vegetables, and increasing water intake.  Cynthia [name] has agreed to follow-up with our clinic in 4 weeks. She was informed of the importance of frequent follow-up  visits to maximize her success with intensive lifestyle modifications for her multiple health conditions.   Objective:   Blood pressure 118/80, pulse (!) 151, temperature 98 F (36.7 C), temperature source Oral, height 5\' 2"  (1.575 m), weight 261 lb (118.4 kg), SpO2 97 %. Body mass index is 47.74 kg/m.  General: Cooperative, alert, well developed, in no acute distress. HEENT: Conjunctivae and lids unremarkable. Cardiovascular: Regular rhythm.  Lungs: Normal work of breathing. Neurologic: No focal deficits.   Lab Results  Component Value Date   CREATININE 0.73 12/07/2020   BUN 17 12/07/2020   NA 138 12/07/2020   K 4.4 12/07/2020   CL 102 12/07/2020   CO2 18 (L) 12/07/2020   Lab Results  Component Value Date   ALT 10 12/07/2020   AST 13 12/07/2020   ALKPHOS 86 12/07/2020   BILITOT 0.2 12/07/2020   Lab Results  Component Value Date   HGBA1C 5.5 12/07/2020   HGBA1C 5.3 06/29/2020   HGBA1C 5.4 06/29/2019   HGBA1C 5.4 10/30/2018   HGBA1C 5.5 01/16/2018   Lab Results  Component Value Date   INSULIN 10.3 12/07/2020   INSULIN 13.7 06/29/2020  INSULIN 12.3 01/06/2020   INSULIN 17.7 06/29/2019   INSULIN 29.3 (H) 10/30/2018   Lab Results  Component Value Date   TSH 1.610 12/07/2020   Lab Results  Component Value Date   CHOL 166 12/07/2020   HDL 55 12/07/2020   LDLCALC 99 12/07/2020   TRIG 59 12/07/2020   CHOLHDL 3.0 12/07/2020   Lab Results  Component Value Date   WBC 9.9 12/07/2020   HGB 13.6 12/07/2020   HCT 42.2 12/07/2020   MCV 89 12/07/2020   PLT 278 12/07/2020   Lab Results  Component Value Date   IRON 60 12/07/2020   TIBC 301 12/07/2020   FERRITIN 94 12/07/2020   Attestation Statements:   Reviewed by clinician on day of visit: allergies, medications, problem list, medical history, surgical history, family history, social history, and previous encounter notes.  I, Insurance claims handler, CMA, am acting as transcriptionist for Helane Rima, DO  I have  reviewed the above documentation for accuracy and completeness, and I agree with the above. Helane Rima, DO

## 2021-01-18 ENCOUNTER — Ambulatory Visit (INDEPENDENT_AMBULATORY_CARE_PROVIDER_SITE_OTHER): Payer: BC Managed Care – PPO | Admitting: Family Medicine

## 2021-02-08 ENCOUNTER — Ambulatory Visit (INDEPENDENT_AMBULATORY_CARE_PROVIDER_SITE_OTHER): Payer: BC Managed Care – PPO | Admitting: Family Medicine

## 2021-02-08 ENCOUNTER — Other Ambulatory Visit: Payer: Self-pay

## 2021-02-08 ENCOUNTER — Encounter (INDEPENDENT_AMBULATORY_CARE_PROVIDER_SITE_OTHER): Payer: Self-pay | Admitting: Family Medicine

## 2021-02-08 VITALS — BP 116/82 | HR 93 | Temp 98.3°F | Ht 62.0 in | Wt 264.0 lb

## 2021-02-08 DIAGNOSIS — E8881 Metabolic syndrome: Secondary | ICD-10-CM

## 2021-02-08 DIAGNOSIS — F411 Generalized anxiety disorder: Secondary | ICD-10-CM | POA: Diagnosis not present

## 2021-02-08 DIAGNOSIS — R0602 Shortness of breath: Secondary | ICD-10-CM | POA: Diagnosis not present

## 2021-02-08 DIAGNOSIS — Z9189 Other specified personal risk factors, not elsewhere classified: Secondary | ICD-10-CM

## 2021-02-08 DIAGNOSIS — Z6841 Body Mass Index (BMI) 40.0 and over, adult: Secondary | ICD-10-CM

## 2021-02-08 DIAGNOSIS — F5081 Binge eating disorder: Secondary | ICD-10-CM

## 2021-02-09 MED ORDER — VYVANSE 40 MG PO CHEW
40.0000 mg | CHEWABLE_TABLET | Freq: Every day | ORAL | 0 refills | Status: DC
Start: 2021-02-09 — End: 2021-03-29

## 2021-02-09 MED ORDER — FLUOXETINE HCL 40 MG PO CAPS
40.0000 mg | ORAL_CAPSULE | Freq: Every day | ORAL | 3 refills | Status: DC
Start: 2021-02-09 — End: 2021-06-21
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
## 2021-02-17 NOTE — Progress Notes (Signed)
Chief Complaint:   OBESITY Cynthia [name] is here to discuss her progress with her obesity treatment plan along with follow-up of her obesity related diagnoses.   Today's visit was #: 61 Starting weight: 264 lbs Starting date: 05/01/2017 Today's weight: 264 lbs Today's date: 02/08/2021 Weight change since last visit: +3 Total lbs lost to date: 0 Body mass index is 48.29 kg/m.   Interim History:Cynthia [name] is stil getting 1400-1500 calories per day.  She is off [place], and says she is listening to her body.  She says she is in a great mental space, but her pain levels are still high.  Current Meal Plan: keeping a food journal and adhering to recommended goals of 1400 calories and 85 grams of protein for 75% of the time.  Current Exercise Plan: Increased walking (8000-9000 steps per day).  Assessment/Plan:   1. SOB (shortness of breath) on exertion New IC was performed today.   2. Metabolic syndrome Starting goal: Lose 7-10% of starting weight. She will continue to focus on protein-rich, low simple carbohydrate foods. We reviewed the importance of hydration, regular exercise for stress reduction, and restorative sleep.  We will continue to check lab work every 3 months, with 10% weight loss, or should any other concerns arise.  3. Binge eating disorder She is taking Vyvanse 40 mg daily for BED. Plan:  The current medical regimen is effective;  continue present plan and medications.  People who binge eat feel as if they don't have control over how much they eat and have feelings of guilt or self-loathing after a binge eating episode. [employer] estimates that about 30 percent of adults with binge eating disorder also have a history of ADHD. The FDA has approved Vyvanse as a treatment option for both ADHD and binge eating. Vyvanse targets the brain's reward center by increasing the levels of dopamine and norepinephrine, the chemicals of the brain responsible for feelings of pleasure. Mindful  eating is the recommended nutritional approach to treating BED.   - Refill Lisdexamfetamine Dimesylate (VYVANSE) 40 MG CHEW; Chew 40 mg by mouth daily.  Dispense: 30 tablet; Refill: 0  4. GAD (generalized anxiety disorder) with emotional eating Cynthia [name] is taking Prozac 30 mg daily for anxiety. The current medical regimen is effective;  continue present plan and medications.  5. At risk for heart disease Due to Cynthia [name]'s current state of health and medical condition(s), she is at a higher risk for heart disease.   This puts the patient at much greater risk to subsequently develop cardiopulmonary conditions that can significantly affect patient's quality of life in a negative manner as well.    At least 9 minutes was spent on counseling Aleathia about these concerns today. Initial goal is to lose at least 5-10% of starting weight to help reduce these risk factors.  We will continue to reassess these conditions on a fairly regular basis in an attempt to decrease patient's overall morbidity and mortality.  Evidence-based interventions for health behavior change were utilized today including the discussion of self monitoring techniques, problem-solving barriers and SMART goal setting techniques.  Specifically regarding patient's less desirable eating habits and patterns, we employed the technique of small changes when Cynthia [name] has not been able to fully commit to her prudent nutritional plan.  6. Obesity, current BMI 48.4  Course: Cynthia [name] is currently in the action stage of change. As such, her goal is to continue with weight loss efforts.   Nutrition goals: She has agreed to keeping a  food journal and adhering to recommended goals of 1200 calories and 95 grams of protein.   Exercise goals:  As is.  Behavioral modification strategies: increasing lean protein intake, decreasing simple carbohydrates, increasing vegetables, increasing water intake, better snacking choices, and emotional eating strategies.  Cynthia [name]  has agreed to follow-up with our clinic in 4 weeks. She was informed of the importance of frequent follow-up visits to maximize her success with intensive lifestyle modifications for her multiple health conditions.   Objective:   Blood pressure 116/82, pulse 93, temperature 98.3 F (36.8 C), temperature source Oral, height 5\' 2"  (1.575 m), weight 264 lb (119.7 kg), SpO2 97 %. Body mass index is 48.29 kg/m.  General: Cooperative, alert, well developed, in no acute distress. HEENT: Conjunctivae and lids unremarkable. Cardiovascular: Regular rhythm.  Lungs: Normal work of breathing. Neurologic: No focal deficits.   Lab Results  Component Value Date   CREATININE 0.73 12/07/2020   BUN 17 12/07/2020   NA 138 12/07/2020   K 4.4 12/07/2020   CL 102 12/07/2020   CO2 18 (L) 12/07/2020   Lab Results  Component Value Date   ALT 10 12/07/2020   AST 13 12/07/2020   ALKPHOS 86 12/07/2020   BILITOT 0.2 12/07/2020   Lab Results  Component Value Date   HGBA1C 5.5 12/07/2020   HGBA1C 5.3 06/29/2020   HGBA1C 5.4 06/29/2019   HGBA1C 5.4 10/30/2018   HGBA1C 5.5 01/16/2018   Lab Results  Component Value Date   INSULIN 10.3 12/07/2020   INSULIN 13.7 06/29/2020   INSULIN 12.3 01/06/2020   INSULIN 17.7 06/29/2019   INSULIN 29.3 (H) 10/30/2018   Lab Results  Component Value Date   TSH 1.610 12/07/2020   Lab Results  Component Value Date   CHOL 166 12/07/2020   HDL 55 12/07/2020   LDLCALC 99 12/07/2020   TRIG 59 12/07/2020   CHOLHDL 3.0 12/07/2020   Lab Results  Component Value Date   VD25OH 23.7 (L) 12/07/2020   VD25OH 29.3 (L) 06/29/2020   VD25OH 27.6 (L) 01/06/2020   Lab Results  Component Value Date   WBC 9.9 12/07/2020   HGB 13.6 12/07/2020   HCT 42.2 12/07/2020   MCV 89 12/07/2020   PLT 278 12/07/2020   Lab Results  Component Value Date   IRON 60 12/07/2020   TIBC 301 12/07/2020   FERRITIN 94 12/07/2020   Attestation Statements:   Reviewed by clinician on  day of visit: allergies, medications, problem list, medical history, surgical history, family history, social history, and previous encounter notes.  I, 12/09/2020, CMA, am acting as transcriptionist for Insurance claims handler, DO  I have reviewed the above documentation for accuracy and completeness, and I agree with the above. Helane Rima, DO

## 2021-02-28 ENCOUNTER — Telehealth (INDEPENDENT_AMBULATORY_CARE_PROVIDER_SITE_OTHER): Payer: BC Managed Care – PPO | Admitting: Psychology

## 2021-02-28 DIAGNOSIS — F3289 Other specified depressive episodes: Secondary | ICD-10-CM | POA: Diagnosis not present

## 2021-02-28 DIAGNOSIS — F909 Attention-deficit hyperactivity disorder, unspecified type: Secondary | ICD-10-CM | POA: Diagnosis not present

## 2021-02-28 DIAGNOSIS — F419 Anxiety disorder, unspecified: Secondary | ICD-10-CM | POA: Diagnosis not present

## 2021-03-07 NOTE — Progress Notes (Signed)
  Office: [phone]  /  Fax: [phone]    Date: March 21, 2021   Appointment Start Time: 8:35am Duration: 31 minutes Provider: Lawerance Cruel, Psy.D. Type of Session: Individual Therapy  Location of Patient: Home (private location) Location of Provider: Provider's Home (private office) Type of Contact: Telepsychological Visit via MyChart Video Visit  Session Content: Teandra is a 35 y.o. female presenting for a follow-up appointment to address the previously established treatment goal of increasing coping skills. Today's appointment was a telepsychological visit due to COVID-19. Arwen provided verbal consent for today's telepsychological appointment and she is aware she is responsible for securing confidentiality on her end of the session. Prior to proceeding with today's appointment, Mayelin's physical location at the time of this appointment was obtained as well a phone number she could be reached at in the event of technical difficulties. Lyndsy and this provider participated in today's telepsychological service. Of note, today's appointment was switched to a regular telephone call at 8:51am with Lind's verbal consent due to technical issues.   This provider conducted a brief check-in. Velmer stated she continues to be "really busy with work." She further shared she started [place], adding she is making a "more conscious effort to eat." Bethzaida indicated feeling as though she has a "very cluttered brain;" therefore, expressed desire to focus on coping. Psychoeducation provided regarding self-compassion. Alesana was engaged in a self-compassion exercise to help with eating-related challenges and other ongoing stressors. She was encouraged to regularly ask herself, "What do I need right now?" She was also encouraged to engage in pleasurable activities to increase self-care. She agreed. Sakari was receptive to today's appointment as evidenced by openness to sharing, responsiveness to feedback, and   willingness to focus on increasing self-compassion .  Mental Status Examination:  Appearance: well groomed and appropriate hygiene  Behavior: appropriate to circumstances Mood: anxious Affect: mood congruent Speech: normal in rate, volume, and tone Eye Contact: appropriate Psychomotor Activity: appropriate Gait: unable to assess Thought Process: linear, logical, and goal directed  Thought Content/Perception: no hallucinations, delusions, bizarre thinking or behavior reported or observed and no evidence or endorsement of suicidal and homicidal ideation, plan, and intent Orientation: time, person, place, and purpose of appointment Memory/Concentration: memory, attention, language, and fund of knowledge intact  Insight/Judgment: fair  Interventions:  Conducted a brief chart review Provided empathic reflections and validation Reviewed content from the previous session Employed supportive psychotherapy interventions to facilitate reduced distress and to improve coping skills with identified stressors Psychoeducation provided regarding self-compassion Engaged patient in a self-compassion exercise  DSM-5 Diagnosis(es):  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 Progress is limited, as Nailah has just begun treatment with this provider; however, she is receptive to the interaction and interventions and rapport is being established.   Plan: Based on appointment availability and Dell's work schedule, the next appointment will be scheduled in three weeks, which will be via MyChart Video Visit. The next session will focus on working towards the established treatment goal.

## 2021-03-08 ENCOUNTER — Other Ambulatory Visit: Payer: Self-pay

## 2021-03-08 ENCOUNTER — Encounter (INDEPENDENT_AMBULATORY_CARE_PROVIDER_SITE_OTHER): Payer: Self-pay | Admitting: Family Medicine

## 2021-03-08 ENCOUNTER — Ambulatory Visit (INDEPENDENT_AMBULATORY_CARE_PROVIDER_SITE_OTHER): Payer: BC Managed Care – PPO | Admitting: Family Medicine

## 2021-03-08 VITALS — BP 109/73 | HR 116 | Temp 98.1°F | Ht 62.0 in | Wt 264.0 lb

## 2021-03-08 DIAGNOSIS — E559 Vitamin D deficiency, unspecified: Secondary | ICD-10-CM | POA: Diagnosis not present

## 2021-03-08 DIAGNOSIS — Z9189 Other specified personal risk factors, not elsewhere classified: Secondary | ICD-10-CM

## 2021-03-08 DIAGNOSIS — Z6841 Body Mass Index (BMI) 40.0 and over, adult: Secondary | ICD-10-CM

## 2021-03-08 DIAGNOSIS — F5081 Binge eating disorder: Secondary | ICD-10-CM | POA: Diagnosis not present

## 2021-03-08 DIAGNOSIS — R7301 Impaired fasting glucose: Secondary | ICD-10-CM

## 2021-03-08 MED ORDER — TIRZEPATIDE 2.5 MG/0.5ML ~~LOC~~ SOAJ: 2.5000 mg | SUBCUTANEOUS | 0 refills | Status: DC

## 2021-03-08 MED ORDER — VITAMIN D (ERGOCALCIFEROL) 1.25 MG (50000 UNIT) PO CAPS: 50000.0000 [IU] | ORAL_CAPSULE | ORAL | 0 refills | Status: DC

## 2021-03-13 NOTE — Progress Notes (Signed)
Chief Complaint:   OBESITY Cynthia [name] is here to discuss her progress with her obesity treatment plan along with follow-up of her obesity related diagnoses.   Today's visit was #: 62 Starting weight: 264 lbs Starting date: 05/01/2017 Today's weight: 264 lbs Today's date: 03/08/2021 Weight change since last visit: 0 Total lbs lost to date: 0 Body mass index is 48.29 kg/m.   Current Meal Plan: keeping a food journal and adhering to recommended goals of 1200 calories and 95 grams of protein for 40% of the time.  Current Exercise Plan: Walking for 30 minutes 7 times per week.  Interim History:  Cynthia [name] saw Dr. Dewaine Conger on 02/28/2021.  She says it was helpful and she is enjoying it.  She has been having root beer float flavor Premier Protein that she got on [employer].  Assessment/Plan:   1. Vitamin D deficiency Not at goal.  She is taking vitamin D 50,000 IU weekly.  Plan: Continue to take prescription Vitamin D @50 ,000 IU every week as prescribed.  Follow-up for routine testing of Vitamin D, at least 2-3 times per year to avoid over-replacement.  Lab Results  Component Value Date   VD25OH 23.7 (L) 12/07/2020   VD25OH 29.3 (L) 06/29/2020   VD25OH 27.6 (L) 01/06/2020   - Refill Vitamin D, Ergocalciferol, (DRISDOL) 1.25 MG (50000 UNIT) CAPS capsule; Take 1 capsule (50,000 Units total) by mouth every 7 (seven) days.  Dispense: 4 capsule; Refill: 0  2. Impaired fasting glucose, with polyphagia After discussion, patient would like to start below medication. Expectations, risks, and potential side effects reviewed. She will start Mounjaro 2.5 mg subcutaneously weekly.  - Start tirzepatide [facility]) 2.5 MG/0.5ML Pen; Inject 2.5 mg into the skin once a week.  Dispense: 2 mL; Refill: 0  3. Binge eating disorder Cynthia [name] is taking Vyvanse 40 mg daily.  The current medical regimen is effective;  continue present plan and medications.  4. At risk for heart disease Due to Cynthia [name]'s current state of  health and medical condition(s), she is at a higher risk for heart disease.  This puts the patient at much greater risk to subsequently develop cardiopulmonary conditions that can significantly affect patient's quality of life in a negative manner.    At least 8 minutes were spent on counseling Cynthia [name] about these concerns today. Evidence-based interventions for health behavior change were utilized today including the discussion of self monitoring techniques, problem-solving barriers, and SMART goal setting techniques.  Specifically, regarding patient's less desirable eating habits and patterns, we employed the technique of small changes when Cynthia [name] has not been able to fully commit to her prudent nutritional plan.  5. Obesity, current BMI 48.4  Course: Cynthia [name] is currently in the action stage of change. As such, her goal is to continue with weight loss efforts.   Nutrition goals: She has agreed to keeping a food journal and adhering to recommended goals of 1200 calories and 95 grams of protein.   Exercise goals:  As is.  Behavioral modification strategies: increasing lean protein intake, decreasing simple carbohydrates, increasing vegetables, and increasing water intake.  Cynthia [name] has agreed to follow-up with our clinic in 4 weeks. She was informed of the importance of frequent follow-up visits to maximize her success with intensive lifestyle modifications for her multiple health conditions.   Objective:   Blood pressure 109/73, pulse (!) 116, temperature 98.1 F (36.7 C), temperature source Oral, height 5\' 2"  (1.575 m), weight 264 lb (119.7 kg), SpO2 96 %. Body mass index  is 48.29 kg/m.  General: Cooperative, alert, well developed, in no acute distress. HEENT: Conjunctivae and lids unremarkable. Cardiovascular: Regular rhythm.  Lungs: Normal work of breathing. Neurologic: No focal deficits.   Lab Results  Component Value Date   CREATININE 0.73 12/07/2020   BUN 17 12/07/2020   NA 138  12/07/2020   K 4.4 12/07/2020   CL 102 12/07/2020   CO2 18 (L) 12/07/2020   Lab Results  Component Value Date   ALT 10 12/07/2020   AST 13 12/07/2020   ALKPHOS 86 12/07/2020   BILITOT 0.2 12/07/2020   Lab Results  Component Value Date   HGBA1C 5.5 12/07/2020   HGBA1C 5.3 06/29/2020   HGBA1C 5.4 06/29/2019   HGBA1C 5.4 10/30/2018   HGBA1C 5.5 01/16/2018   Lab Results  Component Value Date   INSULIN 10.3 12/07/2020   INSULIN 13.7 06/29/2020   INSULIN 12.3 01/06/2020   INSULIN 17.7 06/29/2019   INSULIN 29.3 (H) 10/30/2018   Lab Results  Component Value Date   TSH 1.610 12/07/2020   Lab Results  Component Value Date   CHOL 166 12/07/2020   HDL 55 12/07/2020   LDLCALC 99 12/07/2020   TRIG 59 12/07/2020   CHOLHDL 3.0 12/07/2020   Lab Results  Component Value Date   VD25OH 23.7 (L) 12/07/2020   VD25OH 29.3 (L) 06/29/2020   VD25OH 27.6 (L) 01/06/2020   Lab Results  Component Value Date   WBC 9.9 12/07/2020   HGB 13.6 12/07/2020   HCT 42.2 12/07/2020   MCV 89 12/07/2020   PLT 278 12/07/2020   Lab Results  Component Value Date   IRON 60 12/07/2020   TIBC 301 12/07/2020   FERRITIN 94 12/07/2020   Attestation Statements:   Reviewed by clinician on day of visit: allergies, medications, problem list, medical history, surgical history, family history, social history, and previous encounter notes.  I, Insurance claims handler, CMA, am acting as transcriptionist for Helane Rima, DO  I have reviewed the above documentation for accuracy and completeness, and I agree with the above. Helane Rima, DO

## 2021-03-21 ENCOUNTER — Telehealth (INDEPENDENT_AMBULATORY_CARE_PROVIDER_SITE_OTHER): Payer: BC Managed Care – PPO | Admitting: Psychology

## 2021-03-21 DIAGNOSIS — F3289 Other specified depressive episodes: Secondary | ICD-10-CM | POA: Diagnosis not present

## 2021-03-21 DIAGNOSIS — F419 Anxiety disorder, unspecified: Secondary | ICD-10-CM

## 2021-03-21 DIAGNOSIS — F909 Attention-deficit hyperactivity disorder, unspecified type: Secondary | ICD-10-CM | POA: Diagnosis not present

## 2021-03-29 ENCOUNTER — Other Ambulatory Visit: Payer: Self-pay

## 2021-03-29 ENCOUNTER — Encounter (INDEPENDENT_AMBULATORY_CARE_PROVIDER_SITE_OTHER): Payer: Self-pay | Admitting: Family Medicine

## 2021-03-29 ENCOUNTER — Telehealth (INDEPENDENT_AMBULATORY_CARE_PROVIDER_SITE_OTHER): Payer: BC Managed Care – PPO | Admitting: Family Medicine

## 2021-03-29 DIAGNOSIS — R7301 Impaired fasting glucose: Secondary | ICD-10-CM | POA: Diagnosis not present

## 2021-03-29 DIAGNOSIS — Z6841 Body Mass Index (BMI) 40.0 and over, adult: Secondary | ICD-10-CM | POA: Diagnosis not present

## 2021-03-29 DIAGNOSIS — F5081 Binge eating disorder: Secondary | ICD-10-CM | POA: Diagnosis not present

## 2021-03-29 MED ORDER — VYVANSE 40 MG PO CHEW: 40.0000 mg | CHEWABLE_TABLET | Freq: Every day | ORAL | 0 refills | Status: DC

## 2021-03-30 ENCOUNTER — Encounter (INDEPENDENT_AMBULATORY_CARE_PROVIDER_SITE_OTHER): Payer: Self-pay

## 2021-03-30 MED ORDER — ONDANSETRON 4 MG PO TBDP: 4.0000 mg | ORAL_TABLET | Freq: Three times a day (TID) | ORAL | 0 refills | Status: DC | PRN

## 2021-03-30 MED ORDER — TIRZEPATIDE 5 MG/0.5ML ~~LOC~~ SOAJ
5.0000 mg | SUBCUTANEOUS | 1 refills | Status: DC
Start: 2021-03-30 — End: 2021-04-25

## 2021-03-30 NOTE — Progress Notes (Signed)
TeleHealth Visit:  Due to the COVID-19 pandemic, this visit was completed with telemedicine (audio/video) technology to reduce patient and provider exposure as well as to preserve personal protective equipment.   Cynthia [name] has verbally consented to this TeleHealth visit. The patient is located at home, the provider is located at the [facility] office. The participants in this visit include the listed provider and patient. The visit was conducted today via MyChart video.  Chief Complaint: OBESITY Cynthia [name] is here to discuss her progress with her obesity treatment plan along with follow-up of her obesity related diagnoses. Cynthia [name] is on keeping a food journal and adhering to recommended goals of 1200 calories and 95 grams of protein and states she is following her eating plan approximately 80-90% of the time. Cynthia [name] states she is doing more walking for exercise.  Today's visit was #: 63 Starting weight: 264 lbs Starting date: 05/01/2017  Interim History: Cynthia [name] had an exposure to COVID, and has headache and fatigue.  This is day 5, negative tests.  She thinks that she is down 5 pounds.  Assessment/Plan:   1. Impaired fasting glucose Cynthia [name] is currently taking Mounjaro 2.5 mg subcutaneously weekly.  Plan:  Increase Mounjaro to 5 mg subcutaneously weekly.  Start Zofran 4 mg every 8 hours as needed for nausea.  - Increase and refill tirzepatide (MOUNJARO) 5 MG/0.5ML Pen; Inject 5 mg into the skin once a week.  Dispense: 2 mL; Refill: 1 - Start ondansetron (ZOFRAN ODT) 4 MG disintegrating tablet; Take 1 tablet (4 mg total) by mouth every 8 (eight) hours as needed for nausea or vomiting.  Dispense: 20 tablet; Refill: 0  2. Binge eating disorder Cynthia [name] takes Vyvanse 40 mg daily for BED.  Plan:  Refill Vyvanse 40 mg daily, as per below.  - Refill Lisdexamfetamine Dimesylate (VYVANSE) 40 MG CHEW; Chew 40 mg by mouth daily.  Dispense: 30 tablet; Refill: 0  I have consulted the [place]  Controlled Substances Registry for this patient, and feel the risk/benefit ratio today is favorable for proceeding with this prescription for a controlled substance. The patient understands monitoring parameters and red flags.   3. Obesity, current BMI 48.4  Cynthia [name] is currently in the action stage of change. As such, her goal is to continue with weight loss efforts. She has agreed to keeping a food journal and adhering to recommended goals of 1200 calories and 95 grams of protein.   Exercise goals:  As is.  Behavioral modification strategies: increasing lean protein intake, decreasing simple carbohydrates, increasing vegetables, and increasing water intake.  Cynthia [name] has agreed to follow-up with our clinic in 4 weeks. She was informed of the importance of frequent follow-up visits to maximize her success with intensive lifestyle modifications for her multiple health conditions.  Objective:   VITALS: Per patient if applicable, see vitals. GENERAL: Alert and in no acute distress. CARDIOPULMONARY: No increased WOB. Speaking in clear sentences.  PSYCH: Pleasant and cooperative. Speech normal rate and rhythm. Affect is appropriate. Insight and judgement are appropriate. Attention is focused, linear, and appropriate.  NEURO: Oriented as arrived to appointment on time with no prompting.   Lab Results  Component Value Date   CREATININE 0.73 12/07/2020   BUN 17 12/07/2020   NA 138 12/07/2020   K 4.4 12/07/2020   CL 102 12/07/2020   CO2 18 (L) 12/07/2020   Lab Results  Component Value Date   ALT 10 12/07/2020   AST 13 12/07/2020   ALKPHOS 86 12/07/2020  BILITOT 0.2 12/07/2020   Lab Results  Component Value Date   HGBA1C 5.5 12/07/2020   HGBA1C 5.3 06/29/2020   HGBA1C 5.4 06/29/2019   HGBA1C 5.4 10/30/2018   HGBA1C 5.5 01/16/2018   Lab Results  Component Value Date   INSULIN 10.3 12/07/2020   INSULIN 13.7 06/29/2020   INSULIN 12.3 01/06/2020   INSULIN 17.7 06/29/2019   INSULIN  29.3 (H) 10/30/2018   Lab Results  Component Value Date   TSH 1.610 12/07/2020   Lab Results  Component Value Date   CHOL 166 12/07/2020   HDL 55 12/07/2020   LDLCALC 99 12/07/2020   TRIG 59 12/07/2020   CHOLHDL 3.0 12/07/2020   Lab Results  Component Value Date   VD25OH 23.7 (L) 12/07/2020   VD25OH 29.3 (L) 06/29/2020   VD25OH 27.6 (L) 01/06/2020   Lab Results  Component Value Date   WBC 9.9 12/07/2020   HGB 13.6 12/07/2020   HCT 42.2 12/07/2020   MCV 89 12/07/2020   PLT 278 12/07/2020   Lab Results  Component Value Date   IRON 60 12/07/2020   TIBC 301 12/07/2020   FERRITIN 94 12/07/2020   Attestation Statements:   Reviewed by clinician on day of visit: allergies, medications, problem list, medical history, surgical history, family history, social history, and previous encounter notes.  I, Insurance claims handler, CMA, am acting as transcriptionist for Helane Rima, DO  I have reviewed the above documentation for accuracy and completeness, and I agree with the above. Helane Rima, DO

## 2021-04-03 ENCOUNTER — Encounter (INDEPENDENT_AMBULATORY_CARE_PROVIDER_SITE_OTHER): Payer: Self-pay

## 2021-04-06 ENCOUNTER — Encounter (INDEPENDENT_AMBULATORY_CARE_PROVIDER_SITE_OTHER): Payer: Self-pay | Admitting: Family Medicine

## 2021-04-11 ENCOUNTER — Telehealth (INDEPENDENT_AMBULATORY_CARE_PROVIDER_SITE_OTHER): Payer: BC Managed Care – PPO | Admitting: Psychology

## 2021-04-11 DIAGNOSIS — F3289 Other specified depressive episodes: Secondary | ICD-10-CM | POA: Diagnosis not present

## 2021-04-11 DIAGNOSIS — F909 Attention-deficit hyperactivity disorder, unspecified type: Secondary | ICD-10-CM | POA: Diagnosis not present

## 2021-04-11 DIAGNOSIS — F419 Anxiety disorder, unspecified: Secondary | ICD-10-CM | POA: Diagnosis not present

## 2021-04-11 NOTE — Progress Notes (Signed)
  Office: [phone]  /  Fax: [phone]    Date: April 11, 2021    Appointment Start Time: 8:35am Duration: 28 minutes Provider: Lawerance Cruel, Psy.D. Type of Session: Individual Therapy  Location of Patient: Home (private room) Location of Provider: Provider's Home (private office) Type of Contact: Telepsychological Visit via MyChart Video Visit  Session Content: Cynthia [name] is a 35 y.o. female presenting for a follow-up appointment to address the previously established treatment goal of increasing coping skills.Today's appointment was a telepsychological visit due to COVID-19. Cynthia [name] provided verbal consent for today's telepsychological appointment and she is aware she is responsible for securing confidentiality on her end of the session. Prior to proceeding with today's appointment, Cynthia [name]'s physical location at the time of this appointment was obtained as well a phone number she could be reached at in the event of technical difficulties. Cynthia [name] and this provider participated in today's telepsychological service.   This provider conducted a brief check-in. Cynthia [name] shared she was in "COVID quarantine" for 10 days, adding she did not test positive. She further shared she is focusing on "catching up on work." Reviewed self-compassion. Cynthia [name] reported she is focusing more on self-care. Positive reinforcement was provided. Regarding eating, Cynthia [name] discussed, "It has been hard." Psychoeducation regarding emotional versus physical hunger was provided to increase awareness of hunger patterns and subsequent eating. Cynthia [name] provided verbal consent during today's appointment for this provider to send a handout for hunger patterns. She also discussed not consuming enough protein and described feeling disappointment and exhaustion. Associated thoughts and feelings processed. Psychoeducation regarding all or nothing also provided. Additionally, she was encouraged to set alarms on her phone to eat more  regularly/frequently. Overall, Cynthia [name] was receptive to today's appointment as evidenced by openness to sharing, responsiveness to feedback, and willingness to implement discussed strategies .  Mental Status Examination:  Appearance: well groomed and appropriate hygiene  Behavior: appropriate to circumstances Mood: euthymic Affect: mood congruent Speech: normal in rate, volume, and tone Eye Contact: appropriate Psychomotor Activity: appropriate Gait: unable to assess Thought Process: linear, logical, and goal directed  Thought Content/Perception: no hallucinations, delusions, bizarre thinking or behavior reported or observed and no evidence or endorsement of suicidal and homicidal ideation, plan, and intent Orientation: time, person, place, and purpose of appointment Memory/Concentration: memory, attention, language, and fund of knowledge intact  Insight/Judgment: fair  Interventions:  Conducted a brief chart review Provided empathic reflections and validation Provided positive reinforcement Employed supportive psychotherapy interventions to facilitate reduced distress and to improve coping skills with identified stressors Psychoeducation provided regarding physical versus emotional hunger Psychoeducation provided regarding all or nothing thinking  Engaged in problem solving   DSM-5 Diagnosis(es):  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 Cynthia [name] has demonstrated progress in her goal as evidenced by an increase in self-care.   Plan: The next appointment will be scheduled in three weeks, which will be via MyChart Video Visit. The next session will focus on working towards the established treatment  goal.

## 2021-04-18 NOTE — Progress Notes (Unsigned)
  Office: [phone]  /  Fax: [phone]    Date: May 02, 2021   Appointment Start Time: *** Duration: *** minutes Provider: Lawerance Cruel, Psy.D. Type of Session: Individual Therapy  Location of Patient: {gbptloc:23249} Location of Provider: Provider's Home (private office) Type of Contact: Telepsychological Visit via MyChart Video Visit  Session Content: Cynthia [name] is a 35 y.o. female presenting for a follow-up appointment to address the previously established treatment goal of increasing coping skills.Today's appointment was a telepsychological visit due to COVID-19. [name] provided verbal consent for today's telepsychological appointment and she is aware she is responsible for securing confidentiality on her end of the session. Prior to proceeding with today's appointment, [name]'s physical location at the time of this appointment was obtained as well a phone number she could be reached at in the event of technical difficulties. [name] and this provider participated in today's telepsychological service.   This provider conducted a brief check-in. *** [name] was receptive to today's appointment as evidenced by openness to sharing, responsiveness to feedback, and {gbreceptiveness:23401}.  Mental Status Examination:  Appearance: {Appearance:22431} Behavior: {Behavior:22445} Mood: {gbmood:21757} Affect: {Affect:22436} Speech: {Speech:22432} Eye Contact: {Eye Contact:22433} Psychomotor Activity: {Motor Activity:22434} Gait: {gbgait:23404} Thought Process: {thought process:22448}  Thought Content/Perception: {disturbances:22451} Orientation: {Orientation:22437} Memory/Concentration: {gbcognition:22449} Insight/Judgment: {Insight:22446}  Interventions:  {Interventions for Progress Notes:23405}  DSM-5 Diagnosis(es): F50.89 Other Specified Feeding or Eating Disorder, Emotional Eating Behaviors, 314.01 (F90.9) Unspecified Attention-Deficit/Hyperactivity Disorder , and F41.9 Unspecified  Anxiety Disorder  Treatment Goal & Progress: During the initial appointment with this provider, the following treatment goal was established: increase coping skills. [name] has demonstrated progress in her goal as evidenced by {gbtxprogress:22839}. [name] also {gbtxprogress2:22951}.  Plan: The next appointment will be scheduled in {gbweeks:21758}, which will be {gbtxmodality:23402}. The next session will focus on {Plan for Next Appointment:23400}.

## 2021-04-25 ENCOUNTER — Encounter (INDEPENDENT_AMBULATORY_CARE_PROVIDER_SITE_OTHER): Payer: Self-pay | Admitting: Family Medicine

## 2021-04-25 ENCOUNTER — Ambulatory Visit (INDEPENDENT_AMBULATORY_CARE_PROVIDER_SITE_OTHER): Payer: BC Managed Care – PPO | Admitting: Family Medicine

## 2021-04-25 ENCOUNTER — Other Ambulatory Visit: Payer: Self-pay

## 2021-04-25 VITALS — BP 125/76 | HR 113 | Temp 98.0°F | Ht 62.0 in | Wt 259.0 lb

## 2021-04-25 DIAGNOSIS — Z6841 Body Mass Index (BMI) 40.0 and over, adult: Secondary | ICD-10-CM

## 2021-04-25 DIAGNOSIS — Z9189 Other specified personal risk factors, not elsewhere classified: Secondary | ICD-10-CM | POA: Diagnosis not present

## 2021-04-25 DIAGNOSIS — F5081 Binge eating disorder: Secondary | ICD-10-CM

## 2021-04-25 DIAGNOSIS — R7301 Impaired fasting glucose: Secondary | ICD-10-CM

## 2021-04-25 DIAGNOSIS — G8929 Other chronic pain: Secondary | ICD-10-CM | POA: Diagnosis not present

## 2021-04-25 DIAGNOSIS — F439 Reaction to severe stress, unspecified: Secondary | ICD-10-CM

## 2021-04-25 MED ORDER — TIRZEPATIDE 5 MG/0.5ML ~~LOC~~ SOAJ: 5.0000 mg | SUBCUTANEOUS | 1 refills | Status: DC

## 2021-04-25 MED ORDER — VYVANSE 40 MG PO CHEW: 40.0000 mg | CHEWABLE_TABLET | Freq: Every day | ORAL | 0 refills | Status: DC

## 2021-04-26 NOTE — Progress Notes (Signed)
Chief Complaint:   OBESITY Cynthia [name] is here to discuss her progress with her obesity treatment plan along with follow-up of her obesity related diagnoses.   Today's visit was #: 64 Starting weight: 264 lbs Starting date: 05/01/2017 Today's weight: 259 lbs Today's date: 04/25/2021 Weight change since last visit: 5 lbs Total lbs lost to date: 5 lbs Body mass index is 47.37 kg/m.  Total weight loss percentage to date: -1.89%  Current Meal Plan: keeping a food journal and adhering to recommended goals of 1200 calories and 95 grams of protein for 80-85% of the time.  Current Exercise Plan: Walking for 30 minutes 7 times per week. Current Anti-Obesity Medications: Mounjaro 5 mg subcutaneously weekly. Side effects: None.  Interim History:  [name] has been seeing Dr. Dewaine Conger.  She says that work is very stressful.  She has been trying hard to keep the long hours/stress/sleep disruption from affecting her chronic pain and weight management attempts.  Assessment/Plan:   1. Impaired fasting glucose, with polyphagia Controlled. Current treatment: Mounjaro 5 mg subcutaneously weekly. She will continue to focus on protein-rich, low simple carbohydrate foods. We reviewed the importance of hydration, regular exercise for stress reduction, and restorative sleep.  Plan:  Refill Mounjaro 5 mg subcutaneously weekly, as per below.  - Refill tirzepatide (MOUNJARO) 5 MG/0.5ML Pen; Inject 5 mg into the skin once a week.  Dispense: 2 mL; Refill: 1  2. Other chronic pain Worsening. We will continue to monitor symptoms as they relate to her weight loss journey.  3. Situational stress Worsening. Markea is taking Prozac 30 mg daily for anxiety.   Plan: Specifically regarding patient's less desirable eating habits and patterns, we employed the technique of small changes when she cannot fully commit to her prudent nutritional plan.  4. Binge eating disorder Controlled. Zenith takes Vyvanse 40 mg daily for  BED.   Plan:  The current medical regimen is effective;  continue present plan and medications.  - Refill Lisdexamfetamine Dimesylate (VYVANSE) 40 MG CHEW; Chew 40 mg by mouth daily.  Dispense: 30 tablet; Refill: 0  I have consulted the [place] Controlled Substances Registry for this patient, and feel the risk/benefit ratio today is favorable for proceeding with this prescription for a controlled substance. The patient understands monitoring parameters and red flags.   5. Obesity, current BMI 47.4  Course: [name] is currently in the action stage of change. As such, her goal is to continue with weight loss efforts.   Nutrition goals: She has agreed to keeping a food journal and adhering to recommended goals of 1200 calories and 95 grams of protein.   Exercise goals:  As is.  Behavioral modification strategies: increasing lean protein intake, decreasing simple carbohydrates, and increasing vegetables.  [name] has agreed to follow-up with our clinic in 4 weeks. She was informed of the importance of frequent follow-up visits to maximize her success with intensive lifestyle modifications for her multiple health conditions.   Objective:   Blood pressure 125/76, pulse (!) 113, temperature 98 F (36.7 C), temperature source Oral, height 5\' 2"  (1.575 m), weight 259 lb (117.5 kg), SpO2 96 %. Body mass index is 47.37 kg/m.  General: Cooperative, alert, well developed, in no acute distress. HEENT: Conjunctivae and lids unremarkable. Cardiovascular: Regular rhythm.  Lungs: Normal work of breathing. Neurologic: No focal deficits.   Lab Results  Component Value Date   CREATININE 0.73 12/07/2020   BUN 17 12/07/2020   NA 138 12/07/2020   K 4.4 12/07/2020  CL 102 12/07/2020   CO2 18 (L) 12/07/2020   Lab Results  Component Value Date   ALT 10 12/07/2020   AST 13 12/07/2020   ALKPHOS 86 12/07/2020   BILITOT 0.2 12/07/2020   Lab Results  Component Value Date   HGBA1C 5.5 12/07/2020   HGBA1C  5.3 06/29/2020   HGBA1C 5.4 06/29/2019   HGBA1C 5.4 10/30/2018   HGBA1C 5.5 01/16/2018   Lab Results  Component Value Date   INSULIN 10.3 12/07/2020   INSULIN 13.7 06/29/2020   INSULIN 12.3 01/06/2020   INSULIN 17.7 06/29/2019   INSULIN 29.3 (H) 10/30/2018   Lab Results  Component Value Date   TSH 1.610 12/07/2020   Lab Results  Component Value Date   CHOL 166 12/07/2020   HDL 55 12/07/2020   LDLCALC 99 12/07/2020   TRIG 59 12/07/2020   CHOLHDL 3.0 12/07/2020   Lab Results  Component Value Date   VD25OH 23.7 (L) 12/07/2020   VD25OH 29.3 (L) 06/29/2020   VD25OH 27.6 (L) 01/06/2020   Lab Results  Component Value Date   WBC 9.9 12/07/2020   HGB 13.6 12/07/2020   HCT 42.2 12/07/2020   MCV 89 12/07/2020   PLT 278 12/07/2020   Lab Results  Component Value Date   IRON 60 12/07/2020   TIBC 301 12/07/2020   FERRITIN 94 12/07/2020   Attestation Statements:   Reviewed by clinician on day of visit: allergies, medications, problem list, medical history, surgical history, family history, social history, and previous encounter notes.  I, Insurance claims handler, CMA, am acting as transcriptionist for Helane Rima, DO  I have reviewed the above documentation for accuracy and completeness, and I agree with the above. -  Helane Rima, DO, MS, FAAFP, DABOM - Family and Bariatric Medicine.

## 2021-05-02 ENCOUNTER — Telehealth (INDEPENDENT_AMBULATORY_CARE_PROVIDER_SITE_OTHER): Payer: BC Managed Care – PPO | Admitting: Psychology

## 2021-05-03 ENCOUNTER — Other Ambulatory Visit: Payer: Self-pay | Admitting: Neurology

## 2021-05-08 NOTE — Progress Notes (Signed)
  Office: [phone]  /  Fax: [phone]    Date: May 22, 2021   Appointment Start Time: 8:33am Duration: 27 minutes Provider: Lawerance Cruel, Psy.D. Type of Session: Individual Therapy  Location of Patient: Home (private location) Location of Provider: Provider's Home (private office) Type of Contact: Telepsychological Visit via MyChart Video Visit  Session Content: Cynthia [name] is a 35 y.o. female presenting for a follow-up appointment to address the previously established treatment goal of increasing coping skills.Today's appointment was a telepsychological visit due to COVID-19. Cynthia [name] provided verbal consent for today's telepsychological appointment and she is aware she is responsible for securing confidentiality on her end of the session. Prior to proceeding with today's appointment, Cynthia [name]'s physical location at the time of this appointment was obtained as well a phone number she could be reached at in the event of technical difficulties. Cynthia [name] and this provider participated in today's telepsychological service.   This provider conducted a brief check-in. Cynthia [name] indicated, "Work has been exhausting." She shared about recent stressors. The aforementioned has impacted her appetite. It was recommended she set reminders on her phone to eat regularly/frequently. She was also receptive to discussing with Dr. Earlene Plater options for eating out. Further discussed self-compassion and self-care. Engaged Cynthia [name] in problem solving to help her increase engagement in pleasurable activities. Overall, Cynthia [name] was receptive to today's appointment as evidenced by openness to sharing, responsiveness to feedback, and willingness to implement discussed strategies .  Mental Status Examination:  Appearance: well groomed and appropriate hygiene  Behavior: appropriate to circumstances Mood: sad Affect: mood congruent Speech: normal in rate, volume, and tone Eye Contact: appropriate Psychomotor Activity:  appropriate Gait: unable to assess Thought Process: linear, logical, and goal directed  Thought Content/Perception: no hallucinations, delusions, bizarre thinking or behavior reported or observed and no evidence or endorsement of suicidal and homicidal ideation, plan, and intent Orientation: time, person, place, and purpose of appointment Memory/Concentration: memory, attention, language, and fund of knowledge intact  Insight/Judgment: fair  Interventions:  Conducted a brief chart review Provided empathic reflections and validation Reviewed content from the previous session Employed supportive psychotherapy interventions to facilitate reduced distress and to improve coping skills with identified stressors Employed insight oriented and cognitive psychotherapy interventions to identify and modify anxiety/mood producing thoughts, beliefs, and negative self-appraisals contributing to distress and emotional eating Engaged patient in problem solving  DSM-5 Diagnosis(es):  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 Cynthia [name] has demonstrated progress in her goal as evidenced by continued willingness to engagement in self-care.   Plan: The next appointment will be scheduled in three weeks, which will be via MyChart Video Visit. The next session will focus on working towards the established treatment goal. This provider recommended she contact her primary therapist to schedule an appointment due to ongoing stressors.

## 2021-05-22 ENCOUNTER — Telehealth (INDEPENDENT_AMBULATORY_CARE_PROVIDER_SITE_OTHER): Payer: BC Managed Care – PPO | Admitting: Psychology

## 2021-05-22 DIAGNOSIS — F419 Anxiety disorder, unspecified: Secondary | ICD-10-CM | POA: Diagnosis not present

## 2021-05-22 DIAGNOSIS — F909 Attention-deficit hyperactivity disorder, unspecified type: Secondary | ICD-10-CM | POA: Diagnosis not present

## 2021-05-22 DIAGNOSIS — F3289 Other specified depressive episodes: Secondary | ICD-10-CM | POA: Diagnosis not present

## 2021-05-24 ENCOUNTER — Other Ambulatory Visit: Payer: Self-pay

## 2021-05-24 ENCOUNTER — Ambulatory Visit (INDEPENDENT_AMBULATORY_CARE_PROVIDER_SITE_OTHER): Payer: BC Managed Care – PPO | Admitting: Bariatrics

## 2021-05-24 ENCOUNTER — Encounter (INDEPENDENT_AMBULATORY_CARE_PROVIDER_SITE_OTHER): Payer: Self-pay | Admitting: Bariatrics

## 2021-05-24 VITALS — BP 126/82 | HR 110 | Temp 98.2°F | Ht 62.0 in | Wt 255.0 lb

## 2021-05-24 DIAGNOSIS — Z6841 Body Mass Index (BMI) 40.0 and over, adult: Secondary | ICD-10-CM

## 2021-05-24 DIAGNOSIS — R7301 Impaired fasting glucose: Secondary | ICD-10-CM

## 2021-05-24 DIAGNOSIS — F5081 Binge eating disorder: Secondary | ICD-10-CM | POA: Diagnosis not present

## 2021-05-24 MED ORDER — TIRZEPATIDE 5 MG/0.5ML ~~LOC~~ SOAJ: 5.0000 mg | SUBCUTANEOUS | 1 refills | Status: DC

## 2021-05-24 MED ORDER — VYVANSE 40 MG PO CHEW: 40.0000 mg | CHEWABLE_TABLET | Freq: Every day | ORAL | 0 refills | Status: DC

## 2021-05-24 NOTE — Progress Notes (Signed)
Chief Complaint:   OBESITY Cynthia [name] is here to discuss her progress with her obesity treatment plan along with follow-up of her obesity related diagnoses. Cynthia [name] is on keeping a food journal and adhering to recommended goals of 1200 calories and 95 grams of protein and states she is following her eating plan approximately 70% of the time. Cynthia [name] states she is walking for 30 minutes 7 times per week.  Today's visit was #: 65 Starting weight: 264 lbs Starting date: 05/01/2017 Today's weight: 255 lbs Today's date: 05/24/2021 Total lbs lost to date: 9 lbs Total lbs lost since last in-office visit: 4 lbs  Interim History: Cynthia [name] is down an additional 4 lbs. Things have been rough at home and work. She is working with Dr. Dewaine Conger.   Subjective:   1. Impaired fasting glucose, with polyphagia  Cynthia [name] notes decreased hunger.     2. Binge eating disorder Cynthia [name] is taking her medications as directed. She is struggling with emotional eating and using food for comfort to the extent that it is negatively impacting her health. She has been working on behavior modification techniques to help reduce her emotional eating. She shows no sign of suicidal or homicidal ideations.   Assessment/Plan:   1. Impaired fasting glucose, with polyphagia Intensive lifestyle modifications are the first line treatment for this issue. We discussed several lifestyle modifications today and she will continue to work on diet, exercise and weight loss efforts. We will refill Mounjaro 5 mg with no refills. Orders and follow up as documented in patient record.  Counseling Polyphagia is excessive hunger. Causes can include: low blood sugars, hypERthyroidism, PMS, lack of sleep, stress, insulin resistance, diabetes, certain medications, and diets that are deficient in protein and fiber.   - tirzepatide (MOUNJARO) 5 MG/0.5ML Pen; Inject 5 mg into the skin once a week.  Dispense: 2 mL; Refill: 1  2. Binge eating disorder Behavior  modification techniques were discussed today to help Cynthia [name] deal with her emotional/non-hunger eating behaviors.  We will refill Vyvanse 40 mg daily for 1 month with no refills. Orders and follow up as documented in patient record.    - Lisdexamfetamine Dimesylate (VYVANSE) 40 MG CHEW; Chew 40 mg by mouth daily.  Dispense: 30 tablet; Refill: 0  3. BMI today 46.7 Cynthia [name] is currently in the action stage of change. As such, her goal is to continue with weight loss efforts. She has agreed to keeping a food journal and adhering to recommended goals of 1200 calories and 95 grams of protein.   Cynthia [name] will continue meal planning. She will adhere to the plan at 80-90%.  Exercise goals:  Cynthia [name] will continue with weight loss.  Behavioral modification strategies: increasing lean protein intake, decreasing simple carbohydrates, increasing vegetables, increasing water intake, decreasing eating out, no skipping meals, meal planning and cooking strategies, keeping healthy foods in the home, and planning for success.  Cynthia [name] has agreed to follow-up with our clinic in 3 weeks Dr. Earlene Plater. She was informed of the importance of frequent follow-up visits to maximize her success with intensive lifestyle modifications for her multiple health conditions.   Objective:   Blood pressure 126/82, pulse (!) 110, temperature 98.2 F (36.8 C), height 5\' 2"  (1.575 m), weight 255 lb (115.7 kg), SpO2 97 %. Body mass index is 46.64 kg/m.  General: Cooperative, alert, well developed, in no acute distress. HEENT: Conjunctivae and lids unremarkable. Cardiovascular: Regular rhythm.  Lungs: Normal work of breathing. Neurologic: No focal deficits.   Lab Results  Component Value Date   CREATININE 0.73 12/07/2020   BUN 17 12/07/2020   NA 138 12/07/2020   K 4.4 12/07/2020   CL 102 12/07/2020   CO2 18 (L) 12/07/2020   Lab Results  Component Value Date   ALT 10 12/07/2020   AST 13 12/07/2020   ALKPHOS 86 12/07/2020   BILITOT  0.2 12/07/2020   Lab Results  Component Value Date   HGBA1C 5.5 12/07/2020   HGBA1C 5.3 06/29/2020   HGBA1C 5.4 06/29/2019   HGBA1C 5.4 10/30/2018   HGBA1C 5.5 01/16/2018   Lab Results  Component Value Date   INSULIN 10.3 12/07/2020   INSULIN 13.7 06/29/2020   INSULIN 12.3 01/06/2020   INSULIN 17.7 06/29/2019   INSULIN 29.3 (H) 10/30/2018   Lab Results  Component Value Date   TSH 1.610 12/07/2020   Lab Results  Component Value Date   CHOL 166 12/07/2020   HDL 55 12/07/2020   LDLCALC 99 12/07/2020   TRIG 59 12/07/2020   CHOLHDL 3.0 12/07/2020   Lab Results  Component Value Date   VD25OH 23.7 (L) 12/07/2020   VD25OH 29.3 (L) 06/29/2020   VD25OH 27.6 (L) 01/06/2020   Lab Results  Component Value Date   WBC 9.9 12/07/2020   HGB 13.6 12/07/2020   HCT 42.2 12/07/2020   MCV 89 12/07/2020   PLT 278 12/07/2020   Lab Results  Component Value Date   IRON 60 12/07/2020   TIBC 301 12/07/2020   FERRITIN 94 12/07/2020   Attestation Statements:   Reviewed by clinician on day of visit: allergies, medications, problem list, medical history, surgical history, family history, social history, and previous encounter notes.  I, Jackson Latino, RMA, am acting as Energy manager for [employer], DO.   I have reviewed the above documentation for accuracy and completeness, and I agree with the above. Corinna Capra, DO

## 2021-05-26 ENCOUNTER — Ambulatory Visit (INDEPENDENT_AMBULATORY_CARE_PROVIDER_SITE_OTHER): Payer: BC Managed Care – PPO | Admitting: Family Medicine

## 2021-05-26 ENCOUNTER — Telehealth: Payer: BC Managed Care – PPO | Admitting: Nurse Practitioner

## 2021-05-26 ENCOUNTER — Encounter: Payer: Self-pay | Admitting: Family Medicine

## 2021-05-26 ENCOUNTER — Other Ambulatory Visit: Payer: Self-pay

## 2021-05-26 VITALS — HR 77 | Temp 98.2°F | Ht 62.0 in | Wt 255.0 lb

## 2021-05-26 DIAGNOSIS — B9789 Other viral agents as the cause of diseases classified elsewhere: Secondary | ICD-10-CM

## 2021-05-26 DIAGNOSIS — J3089 Other allergic rhinitis: Secondary | ICD-10-CM | POA: Diagnosis not present

## 2021-05-26 DIAGNOSIS — J329 Chronic sinusitis, unspecified: Secondary | ICD-10-CM

## 2021-05-26 DIAGNOSIS — J302 Other seasonal allergic rhinitis: Secondary | ICD-10-CM

## 2021-05-26 DIAGNOSIS — J4540 Moderate persistent asthma, uncomplicated: Secondary | ICD-10-CM | POA: Diagnosis not present

## 2021-05-26 MED ORDER — PREDNISONE 10 MG PO TABS: ORAL_TABLET | ORAL | 0 refills | Status: DC

## 2021-05-26 MED ORDER — FLUTICASONE PROPIONATE 50 MCG/ACT NA SUSP: 2.0000 | Freq: Every day | NASAL | 6 refills | Status: DC

## 2021-05-26 NOTE — Progress Notes (Signed)
E-Visit for Sinus Problems  We are sorry that you are not feeling well.  Here is how we plan to help! Is you develop a fever or bodyaches , then you will need ot be tested for flu. Based on what you have shared with me it looks like you have sinusitis.  Sinusitis is inflammation and infection in the sinus cavities of the head.  Based on your presentation I believe you most likely have Acute Viral Sinusitis.This is an infection most likely caused by a virus. There is not specific treatment for viral sinusitis other than to help you with the symptoms until the infection runs its course.  You may use an oral decongestant such as Mucinex D or if you have glaucoma or high blood pressure use plain Mucinex. Saline nasal spray help and can safely be used as often as needed for congestion, I have prescribed: Fluticasone nasal spray two sprays in each nostril once a day  Some authorities believe that zinc sprays or the use of Echinacea may shorten the course of your symptoms.  Sinus infections are not as easily transmitted as other respiratory infection, however we still recommend that you avoid close contact with loved ones, especially the very young and elderly.  Remember to wash your hands thoroughly throughout the day as this is the number one way to prevent the spread of infection!  Home Care: Only take medications as instructed by your medical team. Do not take these medications with alcohol. A steam or ultrasonic humidifier can help congestion.  You can place a towel over your head and breathe in the steam from hot water coming from a faucet. Avoid close contacts especially the very young and the elderly. Cover your mouth when you cough or sneeze. Always remember to wash your hands.  Get Help Right Away If: You develop worsening fever or sinus pain. You develop a severe head ache or visual changes. Your symptoms persist after you have completed your treatment plan.  Make sure you Understand  these instructions. Will watch your condition. Will get help right away if you are not doing well or get worse.   Thank you for choosing an e-visit.  Your e-visit answers were reviewed by a board certified advanced clinical practitioner to complete your personal care plan. Depending upon the condition, your plan could have included both over the counter or prescription medications.  Please review your pharmacy choice. Make sure the pharmacy is open so you can pick up prescription now. If there is a problem, you may contact your provider through [employer] and have the prescription routed to another pharmacy.  Your safety is important to [place]. If you have drug allergies check your prescription carefully.   For the next 24 hours you can use MyChart to ask questions about today's visit, request a non-urgent call back, or ask for a work or school excuse. You will get an email in the next two days asking about your experience. I hope that your e-visit has been valuable and will speed your recovery.  5-10 minutes spent reviewing and documenting in chart.

## 2021-05-26 NOTE — Patient Instructions (Signed)
Asthma Continue montelukast 10 mg once a day to prevent cough or wheeze Continue albuterol 2 puffs once every 4 hours as needed for cough or wheeze Continue albuterol 2 puffs 5 to 15 minutes before activity to decrease cough or wheeze  Allergic rhinitis Continue allergen avoidance measures directed toward grass pollen, weed pollen, ragweed pollen, tree pollen, and mold as listed below Begin prednisone 10 mg tablets-take 2 tablets on the first day, then take 1 tablet once a day for the next 3 days, then stop.   Stop taking Xyzal for 1 week.  Then you may continue Xyzal 5 mg once a day as needed for runny nose or itch Restart Qnasl 2 sprays in each nostril once a day as needed for a stuffy nose For nasal congestion, continue Mucinex 1200 mg twice a day and increase fluid intake to thin mucus Begin saline nasal rinses as needed for nasal symptoms. Use this before any medicated nasal sprays for best result  Call the clinic if this treatment plan is not working well for you.  Follow up in 2 months or sooner if needed.  Reducing Pollen Exposure The American Academy of Allergy, Asthma and Immunology suggests the following steps to reduce your exposure to pollen during allergy seasons. Do not hang sheets or clothing out to dry; pollen may collect on these items. Do not mow lawns or spend time around freshly cut grass; mowing stirs up pollen. Keep windows closed at night.  Keep car windows closed while driving. Minimize morning activities outdoors, a time when pollen counts are usually at their highest. Stay indoors as much as possible when pollen counts or humidity is high and on windy days when pollen tends to remain in the air longer. Use air conditioning when possible.  Many air conditioners have filters that trap the pollen spores. Use a HEPA room air filter to remove pollen form the indoor air you breathe.  Control of Mold Allergen Mold and fungi can grow on a variety of surfaces provided  certain temperature and moisture conditions exist.  Outdoor molds grow on plants, decaying vegetation and soil.  The major outdoor mold, Alternaria and Cladosporium, are found in very high numbers during hot and dry conditions.  Generally, a late Summer - Fall peak is seen for common outdoor fungal spores.  Rain will temporarily lower outdoor mold spore count, but counts rise rapidly when the rainy period ends.  The most important indoor molds are Aspergillus and Penicillium.  Dark, humid and poorly ventilated basements are ideal sites for mold growth.  The next most common sites of mold growth are the bathroom and the kitchen.  Outdoor [employer] Use air conditioning and keep windows closed Avoid exposure to decaying vegetation. Avoid leaf raking. Avoid grain handling. Consider wearing a face mask if working in moldy areas.  Indoor Mold Control Maintain humidity below 50%. Clean washable surfaces with 5% bleach solution. Remove sources e.g. Contaminated carpets.

## 2021-05-26 NOTE — Progress Notes (Signed)
RE: Cynthia [name] MRN: [id number] DOB: 03/08/1986 Date of Telemedicine Visit: 05/26/2021  Referring provider: Jarrett Soho, PA-C Primary care provider: Jarrett Soho, PA-C  Chief Complaint: Sinus Problem (Ear pain, cough,congestion, sore throat. Exposed by boss with unknown virus. )   Telemedicine Follow Up Visit via Telephone: I connected with Cynthia [name] for a follow up on 05/26/21 by telephone and verified that I am speaking with the correct person using two identifiers.   I discussed the limitations, risks, security and privacy concerns of performing an evaluation and management service by telephone and the availability of in person appointments. I also discussed with the patient that there may be a patient responsible charge related to this service. The patient expressed understanding and agreed to proceed.  Patient is at home  Provider is at the office.  Visit start time: 311 Visit end time: 6 Insurance consent/check in by: [name] consent and medical assistant/nurse: Cree  History of Present Illness: She is a 35 y.o. female, who is being followed for asthma and allergic rhinitis. Her previous allergy office visit was on 12/20/2020 with Dr. Dellis [name]. Her last environmental allergy skin testing was on 05/06/2018 and was positive to grass pollen, weed pollen, ragweed pollen, tree pollen, and mold.  At today's visit, she reports that on Monday she had lunch with her boss who developed a fever later that day.  After a few days she began to experience an increase in pressure in both ears as well as cough that was dry and is now producing mucus, and changes in her voice.  She reports that she began taking Mucinex and increased her fluid intake and began to feel some relief in her sinus and ear pressure yesterday.  Asthma is reported as moderately well controlled with shortness of breath with activity while she is experiencing nasal congestion and cough that began  producing mucus today.  She continues montelukast 10 mg once a day and has not used albuterol recently.  Allergic rhinitis is reported as poorly controlled with symptoms including rhinorrhea ranging in color from clear to yellow and postnasal drainage.  She denies headache, fever, chills, and body aches.  She does report a scratchy feeling in her throat which is worse when she is coughing.  She is taking Xyzol 5 mg once a day and using Qnasl as needed.  She is not currently using a saline nasal rinse.  Reflux is reported as well controlled with Gaviscon as needed which is infrequently.  Her current medications are listed in the chart.  Assessment and Plan: Anarosa is a 35 y.o. female with: Patient Instructions  Asthma Continue montelukast 10 mg once a day to prevent cough or wheeze Continue albuterol 2 puffs once every 4 hours as needed for cough or wheeze Continue albuterol 2 puffs 5 to 15 minutes before activity to decrease cough or wheeze  Allergic rhinitis Continue allergen avoidance measures directed toward grass pollen, weed pollen, ragweed pollen, tree pollen, and mold as listed below Begin prednisone 10 mg tablets-take 2 tablets on the first day, then take 1 tablet once a day for the next 3 days, then stop.   Stop taking Xyzal for 1 week.  Then you may continue Xyzal 5 mg once a day as needed for runny nose or itch Restart Qnasl 2 sprays in each nostril once a day as needed for a stuffy nose For nasal congestion, continue Mucinex 1200 mg twice a day and increase fluid intake to thin mucus Begin saline nasal  rinses as needed for nasal symptoms. Use this before any medicated nasal sprays for best result  Call the clinic if this treatment plan is not working well for you.  Follow up in 2 months or sooner if needed.   Return in about 2 months (around 07/26/2021), or if symptoms worsen or fail to improve.  Meds ordered this encounter  Medications   predniSONE (DELTASONE) 10 MG tablet     Sig: Take 2 tablets on the first day, then take 1 tablet once a day for the next 3 days, then stop    Dispense:  5 tablet    Refill:  0     Medication List:  Current Outpatient Medications  Medication Sig Dispense Refill   albuterol (PROAIR HFA) 108 (90 Base) MCG/ACT inhaler Inhale 1-2 puffs into the lungs every 6 (six) hours as needed for wheezing or shortness of breath. 18 g 1   fexofenadine (ALLEGRA) 180 MG tablet [facility]     FLUoxetine (PROZAC) 40 MG capsule Take 1 capsule (40 mg total) by mouth daily. 90 capsule 3   gabapentin (NEURONTIN) 300 MG capsule TAKE 1 CAPSULE BY MOUTH EVERY MORNING, 1 CAPSULE IN THE EVENING, AND 2 CAPSULES AT BEDTIME//Please call and make overdue appt for further refills 120 capsule 0   levocetirizine (XYZAL) 5 MG tablet TAKE 1 TABLET(5 MG) BY MOUTH EVERY EVENING 90 tablet 1   Lisdexamfetamine Dimesylate (VYVANSE) 40 MG CHEW Chew 40 mg by mouth daily. 30 tablet 0   methocarbamol (ROBAXIN) 500 MG tablet Take 1 tablet (500 mg total) by mouth 2 (two) times daily as needed for muscle spasms. 60 tablet 0   montelukast (SINGULAIR) 10 MG tablet TAKE 1 TABLET(10 MG) BY MOUTH AT BEDTIME. NEED APPOINTMENT FOR ADDITIONAL REFILLS. 90 tablet 1   norethindrone-ethinyl estradiol (LOESTRIN) 1-20 MG-MCG tablet Take 1 tablet by mouth daily.     ondansetron (ZOFRAN ODT) 4 MG disintegrating tablet Take 1 tablet (4 mg total) by mouth every 8 (eight) hours as needed for nausea or vomiting. 20 tablet 0   predniSONE (DELTASONE) 10 MG tablet Take 2 tablets on the first day, then take 1 tablet once a day for the next 3 days, then stop 5 tablet 0   tirzepatide (MOUNJARO) 5 MG/0.5ML Pen Inject 5 mg into the skin once a week. 2 mL 1   traZODone (DESYREL) 100 MG tablet Take 100 mg by mouth at bedtime.     Vitamin D, Ergocalciferol, (DRISDOL) 1.25 MG (50000 UNIT) CAPS capsule Take 1 capsule (50,000 Units total) by mouth every 7 (seven) days. 4 capsule 0   fluticasone (FLONASE) 50  MCG/ACT nasal spray Place 2 sprays into both nostrils daily. (Patient not taking: Reported on 05/26/2021) 16 g 6   hyoscyamine (LEVSIN SL) 0.125 MG SL tablet PLACE 1 TABLET UNDER THE TONGUE PRIOR TO EACH MEAL AND AT BEDTIME AS NEEDED     No current facility-administered medications for this visit.   Allergies: Allergies  Allergen Reactions   Aquacel [Carboxymethylcellulose] Itching, Rash and Other (See Comments)    Caused blisters   Penicillins     Unknown reaction Has patient had a PCN reaction causing immediate rash, facial/tongue/throat swelling, SOB or lightheadedness with hypotension: Unknown Has patient had a PCN reaction causing severe rash involving mucus membranes or skin necrosis: Unknown Has patient had a PCN reaction that required hospitalization: No Has patient had a PCN reaction occurring within the last 10 years: No If all of the above answers are "NO",  then may proceed with Cephalosporin use.  Other reaction(s): unknown as a baby   Adhesive [Tape] Rash    Prefers to use paper tape   Latex Rash   Oxycodone Itching and Rash   I reviewed her past medical history, social history, family history, and environmental history and no significant changes have been reported from previous visit on 12/20/2020.   Objective: Physical Exam Not obtained as encounter was done via telephone.   Previous notes and tests were reviewed.  I discussed the assessment and treatment plan with the patient. The patient was provided an opportunity to ask questions and all were answered. The patient agreed with the plan and demonstrated an understanding of the instructions.   The patient was advised to call back or seek an in-person evaluation if the symptoms worsen or if the condition fails to improve as anticipated.  I provided 40 minutes of non-face-to-face time during this encounter.  It was my pleasure to participate in Veva Grimley care today. Please feel free to contact me with any  questions or concerns.   Sincerely,  Thermon Leyland, FNP

## 2021-05-29 NOTE — Progress Notes (Signed)
  Office: [phone]  /  Fax: [phone]    Date: June 12, 2021   Appointment Start Time: 8:32am Duration: 29 minutes Provider: Lawerance Cruel, Psy.D. Type of Session: Individual Therapy  Location of Patient: Home (private location) Location of Provider: Provider's Home (private office) Type of Contact: Telepsychological Visit via MyChart Video Visit  Session Content: Cynthia [name] is a 35 y.o. female presenting for a follow-up appointment to address the previously established treatment goal of increasing coping skills.Today's appointment was a telepsychological visit due to COVID-19. Cynthia [name] provided verbal consent for today's telepsychological appointment and she is aware she is responsible for securing confidentiality on her end of the session. Prior to proceeding with today's appointment, Cynthia [name]'s physical location at the time of this appointment was obtained as well a phone number she could be reached at in the event of technical difficulties. Cynthia [name] and this provider participated in today's telepsychological service.   This provider conducted a brief check-in. Cynthia [name] shared about recent events, adding, "Life is still crazy. Work is still crazy." She noted she is trying to engage in self-care and take breaks as needed at work. Cynthia [name] reported to plan breaks throughout her day. Psychoeducation provided regarding grounding techniques and she was engaged in a grounding exercise (5-4-3-2-1). Legna provided verbal consent during today's appointment for this provider to send a handout with today's exercise via e-mail. This provider and Cynthia [name] also discussed application of the grounding technique to assist with eating habits. Due to ongoing stressors, this provider checked- in regarding Kamara scheduling an appointment with her primary therapist. She stated she reached out, but she did not receive a response. Cynthia [name] noted a plan to reach out again, but requested to discuss referral options. Overall, Cynthia [name] was  receptive to today's appointment as evidenced by openness to sharing, responsiveness to feedback, and willingness to implement discussed strategies .  Mental Status Examination:  Appearance: well groomed and appropriate hygiene  Behavior: appropriate to circumstances Mood: "incredibly overwhelmed"  Affect: mood congruent Speech: normal in rate, volume, and tone Eye Contact: appropriate Psychomotor Activity: appropriate Gait: unable to assess Thought Process: linear, logical, and goal directed  Thought Content/Perception: no hallucinations, delusions, bizarre thinking or behavior reported or observed and denied suicidal and homicidal ideation, plan, and intent Orientation: time, person, place, and purpose of appointment Memory/Concentration: memory, attention, language, and fund of knowledge intact  Insight/Judgment: fair  Interventions:  Conducted a brief chart review Provided empathic reflections and validation Reviewed content from the previous session Employed supportive psychotherapy interventions to facilitate reduced distress and to improve coping skills with identified stressors Engaged patient in problem solving Psychoeducation provided regarding grounding techniques Engaged pt in a grounding technique  DSM-5 Diagnosis(es):  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 Cynthia [name] has demonstrated progress in her goal as evidenced by increased awareness of hunger patterns. Cynthia [name] also continues to demonstrate willingness to engage in learned skill(s).  Plan: The next appointment will be scheduled in two weeks, which will be via MyChart Video Visit. The next session will focus on working towards the  established treatment goal. Additionally, Cynthia [name] provided verbal consent for this provider to place a referral with [facility] to address ongoing stressors.

## 2021-06-02 ENCOUNTER — Other Ambulatory Visit: Payer: Self-pay | Admitting: Neurology

## 2021-06-06 ENCOUNTER — Encounter: Payer: Self-pay | Admitting: Family Medicine

## 2021-06-06 MED ORDER — DOXYCYCLINE MONOHYDRATE 100 MG PO TABS: 100.0000 mg | ORAL_TABLET | Freq: Two times a day (BID) | ORAL | 0 refills | Status: AC

## 2021-06-08 ENCOUNTER — Ambulatory Visit: Payer: BC Managed Care – PPO

## 2021-06-12 ENCOUNTER — Telehealth (INDEPENDENT_AMBULATORY_CARE_PROVIDER_SITE_OTHER): Payer: BC Managed Care – PPO | Admitting: Psychology

## 2021-06-12 DIAGNOSIS — F909 Attention-deficit hyperactivity disorder, unspecified type: Secondary | ICD-10-CM

## 2021-06-12 DIAGNOSIS — F3289 Other specified depressive episodes: Secondary | ICD-10-CM | POA: Diagnosis not present

## 2021-06-12 DIAGNOSIS — F419 Anxiety disorder, unspecified: Secondary | ICD-10-CM | POA: Diagnosis not present

## 2021-06-12 NOTE — Progress Notes (Signed)
  Office: [phone]  /  Fax: [phone]    Date: June 26, 2021   Appointment Start Time: 8:32am Duration: 27 minutes Provider: Lawerance Cruel, Psy.D. Type of Session: Individual Therapy  Location of Patient: Home (private location) Location of Provider: Provider's Home (private office) Type of Contact: Telepsychological Visit via MyChart Video Visit  Session Content: Cynthia [name] is a 35 y.o. female presenting for a follow-up appointment to address the previously established treatment goal of increasing coping skills.Today's appointment was a telepsychological visit due to COVID-19. Cynthia [name] provided verbal consent for today's telepsychological appointment and she is aware she is responsible for securing confidentiality on her end of the session. Prior to proceeding with today's appointment, Cynthia [name]'s physical location at the time of this appointment was obtained as well a phone number she could be reached at in the event of technical difficulties. Cynthia [name] and this provider participated in today's telepsychological service.   This provider conducted a brief check-in. Cynthia [name] shared, "Work is slowing down." However, she dicussed ongoing stressors at home. Notably, Cynthia [name] discussed engaging in self-care (e.g., decorating for the holidays, watching [employer] movies) and focusing on her eating habits. Positive reinforcement was provided. She denied engaging in binge eating behaviors and described a reduction in emotional eating behaviors. Reviewed eating habits on Thanksgiving. Discussed what went well, what she could have done differently, and what she plans to do for Christmas. Overall, Cynthia [name] was receptive to today's appointment as evidenced by openness to sharing, responsiveness to feedback, and willingness to implement discussed strategies .  Mental Status Examination:  Appearance: well groomed and appropriate hygiene  Behavior: appropriate to circumstances Mood: anxious  Affect: mood congruent Speech:  WNL Eye Contact: appropriate Psychomotor Activity: WNL Gait: unable to assess Thought Process: linear, logical, and goal directed and no endorsement or observation of suicidal, homicidal, and self-harm ideation, plan and intent  Thought Content/Perception: no hallucinations, delusions, bizarre thinking or behavior endorsed or observed Orientation: AAOx4 Memory/Concentration: memory, attention, language, and fund of knowledge intact  Insight: good  Judgment: fair  Interventions:  Conducted a brief chart review Provided empathic reflections and validation Reviewed content from the previous session Employed supportive psychotherapy interventions to facilitate reduced distress and to improve coping skills with identified stressors Employed motivational interviewing skills to assess patient's willingness/desire to adhere to recommended medical treatments and assignments  DSM-5 Diagnosis(es):  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 Cynthia [name] has demonstrated progress in her goal as evidenced by increased awareness of hunger patterns, increased awareness of triggers for emotional eating behaviors, and reduction in emotional eating behaviors . Cynthia [name] also continues to demonstrate willingness to engage in learned skill(s).  Plan: The next appointment will be scheduled in two weeks, which will be via MyChart Video Visit. The next session will focus on working towards the established treatment goal. Additionally, Cynthia [name] reported she spoke with [facility] and she received the new patient paperwork. She stated she will complete the paperwork and will call them back.

## 2021-06-20 ENCOUNTER — Other Ambulatory Visit (HOSPITAL_BASED_OUTPATIENT_CLINIC_OR_DEPARTMENT_OTHER): Payer: Self-pay

## 2021-06-20 ENCOUNTER — Ambulatory Visit: Payer: BC Managed Care – PPO | Attending: Internal Medicine

## 2021-06-20 DIAGNOSIS — Z23 Encounter for immunization: Secondary | ICD-10-CM

## 2021-06-20 MED ORDER — PFIZER COVID-19 VAC BIVALENT 30 MCG/0.3ML IM SUSP
INTRAMUSCULAR | 0 refills | Status: DC
  Filled 2021-06-20: qty 0.3, 1d supply, fill #0

## 2021-06-20 NOTE — Progress Notes (Signed)
   Covid-19 Vaccination Clinic  Name:  Cynthia [name]    MRN: [id number] DOB: 1986-07-05  06/20/2021  [name] was observed post Covid-19 immunization for 15 minutes without incident. She was provided with Vaccine Information Sheet and instruction to access the V-Safe system.   [name] was instructed to call 911 with any severe reactions post vaccine: Difficulty breathing  Swelling of face and throat  A fast heartbeat  A bad rash all over body  Dizziness and weakness   Immunizations Administered     Name Date Dose VIS Date Route   Pfizer Covid-19 Vaccine Bivalent Booster 06/20/2021  9:54 AM 0.3 mL 03/22/2021 Intramuscular   Manufacturer: [employer], [employer]   Lot: [id number]   NDC: [phone]

## 2021-06-21 ENCOUNTER — Other Ambulatory Visit: Payer: Self-pay

## 2021-06-21 ENCOUNTER — Ambulatory Visit (INDEPENDENT_AMBULATORY_CARE_PROVIDER_SITE_OTHER): Payer: BC Managed Care – PPO | Admitting: Family Medicine

## 2021-06-21 ENCOUNTER — Encounter (INDEPENDENT_AMBULATORY_CARE_PROVIDER_SITE_OTHER): Payer: Self-pay | Admitting: Family Medicine

## 2021-06-21 VITALS — BP 109/75 | HR 102 | Temp 98.0°F | Ht 62.0 in | Wt 255.0 lb

## 2021-06-21 DIAGNOSIS — F411 Generalized anxiety disorder: Secondary | ICD-10-CM

## 2021-06-21 DIAGNOSIS — F5081 Binge eating disorder: Secondary | ICD-10-CM

## 2021-06-21 DIAGNOSIS — E559 Vitamin D deficiency, unspecified: Secondary | ICD-10-CM

## 2021-06-21 DIAGNOSIS — R7301 Impaired fasting glucose: Secondary | ICD-10-CM | POA: Diagnosis not present

## 2021-06-21 DIAGNOSIS — Z6841 Body Mass Index (BMI) 40.0 and over, adult: Secondary | ICD-10-CM

## 2021-06-21 MED ORDER — TIRZEPATIDE 7.5 MG/0.5ML ~~LOC~~ SOAJ: 7.5000 mg | SUBCUTANEOUS | 0 refills | Status: DC

## 2021-06-21 MED ORDER — VITAMIN D (ERGOCALCIFEROL) 1.25 MG (50000 UNIT) PO CAPS: 50000.0000 [IU] | ORAL_CAPSULE | ORAL | 0 refills | Status: AC

## 2021-06-21 MED ORDER — FLUOXETINE HCL 40 MG PO CAPS
40.0000 mg | ORAL_CAPSULE | Freq: Every day | ORAL | 3 refills | Status: DC
Start: 2021-06-21 — End: 2021-10-11

## 2021-06-21 MED ORDER — VYVANSE 40 MG PO CHEW
40.0000 mg | CHEWABLE_TABLET | Freq: Every day | ORAL | 0 refills | Status: DC
Start: 2021-06-21 — End: 2021-08-17

## 2021-06-21 MED ORDER — VYVANSE 40 MG PO CHEW: 40.0000 mg | CHEWABLE_TABLET | Freq: Every day | ORAL | 0 refills | Status: DC

## 2021-06-22 NOTE — Progress Notes (Signed)
Chief Complaint:   OBESITY Cynthia [name] is here to discuss her progress with her obesity treatment plan along with follow-up of her obesity related diagnoses. See Medical Weight Management Flowsheet for complete bioelectrical impedance results.  Today's visit was #: 66 Starting weight: 264 lbs Starting date: 05/01/2017 Weight change since last visit: 0 Total lbs lost to date: 9 lbs Total weight loss percentage to date: -3.41%  Nutrition Plan: Keeping a food journal and adhering to recommended goals of 1200 calories and 95 grams of protein daily for 80-90% of the time. Activity: Walking for 30 minutes 7 times per week.  Anti-obesity medications: Mounjaro 5 mg subcutaneously weekly. Reported side effects: None.  Interim History: Cynthia [name] says she is using the meal prep guide.  Gatorade Natural is helping to fight dehydration.    Plan:  Send new guides:  Easy Grab/Go.  Assessment/Plan:   1. Impaired fasting glucose, with hyperphagia Not at goal. Current treatment: Mounjaro 5 mg subcutaneously weekly.    Plan: Increase Mounjaro to 7.5 mg weekly, as per below.  She will continue to focus on protein-rich, low simple carbohydrate foods. We reviewed the importance of hydration, regular exercise for stress reduction, and restorative sleep.  - Increase tirzepatide (MOUNJARO) 7.5 MG/0.5ML Pen; Inject 7.5 mg into the skin once a week.  Dispense: 6 mL; Refill: 0  2. Vitamin D deficiency Not at goal.  She is taking vitamin D 50,000 IU weekly.  Plan: Continue to take prescription Vitamin D @50 ,000 IU every week as prescribed.  Follow-up for routine testing of Vitamin D, at least 2-3 times per year to avoid over-replacement.  Lab Results  Component Value Date   VD25OH 23.7 (L) 12/07/2020   VD25OH 29.3 (L) 06/29/2020   VD25OH 27.6 (L) 01/06/2020   - Refill Vitamin D, Ergocalciferol, (DRISDOL) 1.25 MG (50000 UNIT) CAPS capsule; Take 1 capsule (50,000 Units total) by mouth every 7 (seven) days.   Dispense: 4 capsule; Refill: 0  3. Binge eating disorder Cynthia [name] is taking Vyvanse 40 mg daily.  Cynthia [name] is taking her medications as directed. She is struggling with emotional eating and using food for comfort to the extent that it is negatively impacting her health. She has been working on behavior modification techniques to help reduce her emotional eating.   - Refill Lisdexamfetamine Dimesylate (VYVANSE) 40 MG CHEW; Chew 40 mg by mouth daily.  Dispense: 30 tablet; Refill: 0 - Lisdexamfetamine Dimesylate (VYVANSE) 40 MG CHEW; Chew 40 mg by mouth daily at 12 noon.  Dispense: 30 tablet; Refill: 0  I have consulted the [place] Controlled Substances Registry for this patient, and feel the risk/benefit ratio today is favorable for proceeding with this prescription for a controlled substance. The patient understands monitoring parameters and red flags.   4. GAD (generalized anxiety disorder) Cynthia [name] is taking Prozac 40 mg daily for anxiety. The current medical regimen is effective;  continue present plan and medications.  - Refill FLUoxetine (PROZAC) 40 MG capsule; Take 1 capsule (40 mg total) by mouth daily.  Dispense: 90 capsule; Refill: 3  5. Obesity, current BMI 46.7  Course: Cynthia [name] is currently in the action stage of change. As such, her goal is to continue with weight loss efforts.   Nutrition goals: She has agreed to keeping a food journal and adhering to recommended goals of 1200 calories and 95 grams of protein.   Exercise goals:  As is.  Behavioral modification strategies: increasing lean protein intake, decreasing liquid calories, and no skipping meals.  Cynthia [name] has agreed to follow-up with our clinic in 4 weeks. She was informed of the importance of frequent follow-up visits to maximize her success with intensive lifestyle modifications for her multiple health conditions.   Objective:   Blood pressure 109/75, pulse (!) 102, temperature 98 F (36.7 C), height 5\' 2"  (1.575 m), weight 255 lb  (115.7 kg), SpO2 97 %. Body mass index is 46.64 kg/m.  General: Cooperative, alert, well developed, in no acute distress. HEENT: Conjunctivae and lids unremarkable. Cardiovascular: Regular rhythm.  Lungs: Normal work of breathing. Neurologic: No focal deficits.   Lab Results  Component Value Date   CREATININE 0.73 12/07/2020   BUN 17 12/07/2020   NA 138 12/07/2020   K 4.4 12/07/2020   CL 102 12/07/2020   CO2 18 (L) 12/07/2020   Lab Results  Component Value Date   ALT 10 12/07/2020   AST 13 12/07/2020   ALKPHOS 86 12/07/2020   BILITOT 0.2 12/07/2020   Lab Results  Component Value Date   HGBA1C 5.5 12/07/2020   HGBA1C 5.3 06/29/2020   HGBA1C 5.4 06/29/2019   HGBA1C 5.4 10/30/2018   HGBA1C 5.5 01/16/2018   Lab Results  Component Value Date   INSULIN 10.3 12/07/2020   INSULIN 13.7 06/29/2020   INSULIN 12.3 01/06/2020   INSULIN 17.7 06/29/2019   INSULIN 29.3 (H) 10/30/2018   Lab Results  Component Value Date   TSH 1.610 12/07/2020   Lab Results  Component Value Date   CHOL 166 12/07/2020   HDL 55 12/07/2020   LDLCALC 99 12/07/2020   TRIG 59 12/07/2020   CHOLHDL 3.0 12/07/2020   Lab Results  Component Value Date   VD25OH 23.7 (L) 12/07/2020   VD25OH 29.3 (L) 06/29/2020   VD25OH 27.6 (L) 01/06/2020   Lab Results  Component Value Date   WBC 9.9 12/07/2020   HGB 13.6 12/07/2020   HCT 42.2 12/07/2020   MCV 89 12/07/2020   PLT 278 12/07/2020   Lab Results  Component Value Date   IRON 60 12/07/2020   TIBC 301 12/07/2020   FERRITIN 94 12/07/2020   Attestation Statements:   Reviewed by clinician on day of visit: allergies, medications, problem list, medical history, surgical history, family history, social history, and previous encounter notes.  I, 12/09/2020, CMA, am acting as transcriptionist for Insurance claims handler, DO  I have reviewed the above documentation for accuracy and completeness, and I agree with the above. -  Helane Rima, DO, MS, FAAFP,  [facility] - Family and B[facility].

## 2021-06-26 ENCOUNTER — Telehealth (INDEPENDENT_AMBULATORY_CARE_PROVIDER_SITE_OTHER): Payer: BC Managed Care – PPO | Admitting: Psychology

## 2021-06-26 DIAGNOSIS — F419 Anxiety disorder, unspecified: Secondary | ICD-10-CM | POA: Diagnosis not present

## 2021-06-26 DIAGNOSIS — F909 Attention-deficit hyperactivity disorder, unspecified type: Secondary | ICD-10-CM | POA: Diagnosis not present

## 2021-06-26 DIAGNOSIS — F3289 Other specified depressive episodes: Secondary | ICD-10-CM

## 2021-06-26 NOTE — Progress Notes (Signed)
Office: [phone]  /  Fax: [phone]    Date: July 10, 2021   Appointment Start Time: 8:31am Duration: 37 minutes Provider: Lawerance Cruel, Psy.D. Type of Session: Individual Therapy  Location of Patient: Home (private location)  Location of Provider: Provider's Home (private office) Type of Contact: Telepsychological Visit via MyChart Video Visit  Session Content: Cynthia [name] is a 35 y.o. female presenting for a follow-up appointment to address the previously established treatment goal of increasing coping skills.Today's appointment was a telepsychological visit due to COVID-19. [name] provided verbal consent for today's telepsychological appointment and she is aware she is responsible for securing confidentiality on her end of the session. Prior to proceeding with today's appointment, [name]'s physical location at the time of this appointment was obtained as well a phone number she could be reached at in the event of technical difficulties. Devan and this provider participated in today's telepsychological service.   This provider conducted a brief check-in. [name] stated, "I was doing okay until Friday." She discussed an increase in stressors with work and home responsibilities. She indicated she is "tired and not getting good sleep." She stated she engaged in the previously shared grounding exercise to assist with coping. Positive reinforcement was provided. This provider again discussed the benefit of traditional therapeutic services. She was encouraged to complete the paperwork for [facility] during her two week break from work; she agreed. Regarding eating, [name] reported she sometimes does not remember to eat, but when she does, she is focusing on her protein intake. Positive reinforcement was provided. Remainder of session focused on self-compassion given ongoing stressors and guilt associated with eating related challenges. [name] was encouraged to explore what she needs  right now, and this provider discussed the difference between self-compassion and self-indulgence. Following today's appointment, she noted a plan to listen to music on her way to work as a way to focus on herself and give herself a break from ongoing stressors. [name] was receptive to today's appointment as evidenced by openness to sharing, responsiveness to feedback, and willingness to implement discussed strategies .  Mental Status Examination:  Appearance: well groomed and appropriate hygiene  Behavior: appropriate to circumstances Mood:  "tired, grouchy" ; sad  Affect: mood congruent; tearful at times Speech: WNL Eye Contact: appropriate Psychomotor Activity: WNL Gait: unable to assess Thought Process: linear, logical, and goal directed and no evidence or endorsement of suicidal, homicidal, and self-harm ideation, plan and intent  Thought Content/Perception: no hallucinations, delusions, bizarre thinking or behavior endorsed or observed Orientation: AAOx4 Memory/Concentration: memory, attention, language, and fund of knowledge intact  Insight/Judgment: fair  Interventions:  Conducted a brief chart review Provided empathic reflections and validation Reviewed content from the previous session Employed supportive psychotherapy interventions to facilitate reduced distress and to improve coping skills with identified stressors Employed motivational interviewing skills to assess patient's willingness/desire to adhere to recommended medical treatments and assignments Employed acceptance and commitment interventions to emphasize mindfulness and acceptance without struggle  DSM-5 Diagnosis(es):  F32.89 Other Specified Depressive Disorder, Emotional Eating Behaviors, 314.01 (F90.9) Unspecified Attention-Deficit/Hyperactivity Disorder , and F41.9 Unspecified Anxiety Disorder  Treatment Goal & Progress: During the initial appointment with this provider, the following treatment goal was  established: increase coping skills. [name] has demonstrated progress in her goal as evidenced by increased awareness of hunger patterns and increased awareness of triggers for emotional eating behaviors. [name] also continues to demonstrate willingness to engage in learned skill(s).  Plan: The next appointment will be scheduled in approximately three weeks,  which will be via MyChart Video Visit. The next session will focus on working towards the established treatment goal.

## 2021-06-27 ENCOUNTER — Encounter (INDEPENDENT_AMBULATORY_CARE_PROVIDER_SITE_OTHER): Payer: Self-pay | Admitting: Family Medicine

## 2021-06-27 DIAGNOSIS — R7301 Impaired fasting glucose: Secondary | ICD-10-CM

## 2021-06-28 MED ORDER — TIRZEPATIDE 10 MG/0.5ML ~~LOC~~ SOAJ
10.0000 mg | SUBCUTANEOUS | 0 refills | Status: DC
Start: 2021-06-28 — End: 2021-08-17

## 2021-07-10 ENCOUNTER — Telehealth (INDEPENDENT_AMBULATORY_CARE_PROVIDER_SITE_OTHER): Payer: Self-pay | Admitting: Family Medicine

## 2021-07-10 ENCOUNTER — Telehealth (INDEPENDENT_AMBULATORY_CARE_PROVIDER_SITE_OTHER): Payer: BC Managed Care – PPO | Admitting: Psychology

## 2021-07-10 ENCOUNTER — Encounter (INDEPENDENT_AMBULATORY_CARE_PROVIDER_SITE_OTHER): Payer: Self-pay

## 2021-07-10 DIAGNOSIS — F909 Attention-deficit hyperactivity disorder, unspecified type: Secondary | ICD-10-CM

## 2021-07-10 DIAGNOSIS — F419 Anxiety disorder, unspecified: Secondary | ICD-10-CM | POA: Diagnosis not present

## 2021-07-10 DIAGNOSIS — F3289 Other specified depressive episodes: Secondary | ICD-10-CM | POA: Diagnosis not present

## 2021-07-10 NOTE — Telephone Encounter (Signed)
Prior authorization denied for Mounjaro. Patient already using coupon savings card. Patient sent mychart message.  °

## 2021-07-11 ENCOUNTER — Telehealth (INDEPENDENT_AMBULATORY_CARE_PROVIDER_SITE_OTHER): Payer: Self-pay | Admitting: Psychology

## 2021-07-11 ENCOUNTER — Encounter (INDEPENDENT_AMBULATORY_CARE_PROVIDER_SITE_OTHER): Payer: Self-pay | Admitting: Family Medicine

## 2021-07-11 NOTE — Telephone Encounter (Signed)
°  Office: [phone]  /  Fax: [phone]  Date of Call: July 11, 2021  Time of Call: 12:04pm Duration of Call: ~4 minute(s) Provider: Lawerance Cruel, PsyD  CONTENT: This provider called Chelan to follow-up regarding the MyChart message she sent requesting a letter for her disability hearing. Casidee explained she was previously on disability and that she has a hearing this Thursday, which she reportedly did not have a chance to prepare for given ongoing stressors. This provider explained her role and discussed the rationale as to why she would be unable to provide a letter addressing the noted concerns. Jasani acknowledged understanding and thanked this provider for calling. All questions/concerns addressed.   PLAN: No further follow-up planned by this provider.

## 2021-07-13 ENCOUNTER — Ambulatory Visit (INDEPENDENT_AMBULATORY_CARE_PROVIDER_SITE_OTHER): Payer: BC Managed Care – PPO | Admitting: Family Medicine

## 2021-07-14 DIAGNOSIS — G4700 Insomnia, unspecified: Secondary | ICD-10-CM | POA: Diagnosis not present

## 2021-07-14 DIAGNOSIS — E559 Vitamin D deficiency, unspecified: Secondary | ICD-10-CM | POA: Diagnosis not present

## 2021-07-18 ENCOUNTER — Telehealth: Payer: Self-pay

## 2021-07-18 NOTE — Progress Notes (Signed)
°  Office: [phone]  /  Fax: [phone]    Date: August 01, 2021   Appointment Start Time: 2:32pm Duration: 29 minutes Provider: Lawerance Cruel, Psy.D. Type of Session: Individual Therapy  Location of Patient: Work (private location) Location of Provider: Provider's Home (private office) Type of Contact: Telepsychological Visit via MyChart Video Visit  Session Content: Cynthia [name] is a 35 y.o. female presenting for a follow-up appointment to address the previously established treatment goal of increasing coping skills.Today's appointment was a telepsychological visit due to COVID-19. Cynthia [name] provided verbal consent for today's telepsychological appointment and she is aware she is responsible for securing confidentiality on her end of the session. Prior to proceeding with today's appointment, Cynthia [name]'s physical location at the time of this appointment was obtained as well a phone number she could be reached at in the event of technical difficulties. Cynthia [name] and this provider participated in today's telepsychological service.   This provider conducted a brief check-in. Cynthia [name] shared about recent events, including being sick with COVID-19. She continues to have challenges obtaining her [facility] prescription. Associated thoughts/feelings explored and processed. She feels there is an increase in snacking behaviors/cravings. Psychoeducation regarding triggers for emotional eating was provided. Cynthia [name] was provided a handout, and encouraged to utilize the handout between now and the next appointment to increase awareness of triggers and frequency. Cynthia [name] agreed. This provider also discussed behavioral strategies for specific triggers, such as placing the utensil down when conversing to avoid mindless eating. Cynthia [name] provided verbal consent during today's appointment for this provider to send a handout about triggers via e-mail. Overall, Cynthia [name] was receptive to today's appointment as evidenced by openness to sharing,  responsiveness to feedback, and willingness to explore triggers for emotional eating.  Mental Status Examination:  Appearance: well groomed and appropriate hygiene  Behavior: appropriate to circumstances Mood: anxious  Affect: mood congruent Speech: WNL Eye Contact: appropriate Psychomotor Activity: WNL Gait: unable to assess Thought Process: linear, logical, and goal directed and no evidence or endorsement of suicidal, homicidal, and self-harm ideation, plan and intent  Thought Content/Perception: no hallucinations, delusions, bizarre thinking or behavior endorsed or observed Orientation: AAOx4 Memory/Concentration: memory, attention, language, and fund of knowledge intact  Insight/Judgment: fair  Interventions:  Conducted a brief chart review Provided empathic reflections and validation Employed supportive psychotherapy interventions to facilitate reduced distress and to improve coping skills with identified stressors Employed motivational interviewing skills to assess patient's willingness/desire to adhere to recommended medical treatments and assignments Psychoeducation provided regarding triggers for emotional eating behaviors  DSM-5 Diagnosis(es): F50.89 Other Specified Feeding or Eating Disorder, Emotional Eating Behaviors, F90.9 Unspecified Attention-Deficit/Hyperactivity Disorder , and F41.9 Unspecified Anxiety Disorder  Treatment Goal & Progress: During the initial appointment with this provider, the following treatment goal was established: increase coping skills. Cynthia [name] has demonstrated progress in her goal as evidenced by increased awareness of hunger patterns. Cynthia [name] also continues to demonstrate willingness to engage in learned skill(s).  Plan: The next appointment will be scheduled in three weeks, which will be via MyChart Video Visit. The next session will focus on working towards the established treatment goal. Cynthia [name] is scheduled for an initial appointment with [facility] on August 25, 2021.

## 2021-07-18 NOTE — Telephone Encounter (Signed)
Called and spoke with the patient and advised of Anne's recommendation. She is still having the same symptoms but no fever. She is going to try these recommendations and will call back if she needs further evaluation.

## 2021-07-18 NOTE — Telephone Encounter (Signed)
Patient called stating she tested positive for COVID on 07/17/2021. She started having symptoms on 07/16/21 of Left side ear & throat soreness. She now has Congestion, Runny Nose and Tired. No Fever  She stopped her antihistamines to be on the safe side. Taking Mucinex (Blue Box)  Nasal Saline  She is wondering if there is anything else she could take to prevent her asthma from flaring up?   [facility]

## 2021-07-18 NOTE — Telephone Encounter (Signed)
Please ask her current symptoms. Have her continue montelukast daily and albuterol as needed. If her breathing worsens have her give a call. Have her continue aggressive nasal saline rinses and continue Flonase daily. She can begin Mucinex 1200 mg BID to thin out mucus. Happy to do a televisit if needed. Thank you

## 2021-07-25 ENCOUNTER — Encounter: Payer: Self-pay | Admitting: Allergy & Immunology

## 2021-08-01 ENCOUNTER — Telehealth (INDEPENDENT_AMBULATORY_CARE_PROVIDER_SITE_OTHER): Payer: BC Managed Care – PPO | Admitting: Psychology

## 2021-08-01 DIAGNOSIS — F909 Attention-deficit hyperactivity disorder, unspecified type: Secondary | ICD-10-CM

## 2021-08-01 DIAGNOSIS — F419 Anxiety disorder, unspecified: Secondary | ICD-10-CM

## 2021-08-01 DIAGNOSIS — F3289 Other specified depressive episodes: Secondary | ICD-10-CM | POA: Diagnosis not present

## 2021-08-09 ENCOUNTER — Telehealth (INDEPENDENT_AMBULATORY_CARE_PROVIDER_SITE_OTHER): Payer: Self-pay

## 2021-08-09 NOTE — Telephone Encounter (Signed)
Cynthia [name] is scheduled to see Dr. Juleen China on Monday, 1/23.  She hasn't been able to get [facility] for a couple of months.  She was wondering if there was anyway you could get a jump on prior auths for any of her medication needs, especially the Mounjara.  She is requesting you give her a call.  She is traveling today and unable to get to her My Chart.  Thank you

## 2021-08-11 NOTE — Progress Notes (Signed)
°  Office: [phone]  /  Fax: [phone]    Date: August 22, 2021   Appointment Start Time: 8:04am Duration: 27 minutes Provider: Glennie Isle, Psy.D. Type of Session: Individual Therapy  Location of Patient: Home Location of Provider: Provider's Home (private office) Type of Contact: Telepsychological Visit via MyChart Video Visit  Session Content: This provider called Cynthia [name] at 8:02am as she did not present for today's appointment. A HIPAA compliant voicemail was left requesting a call back. She was observed joining. As such, today's appointment was initiated 4 minutes late. Cynthia [name] is a 36 y.o. female presenting for a follow-up appointment to address the previously established treatment goal of increasing coping skills.Today's appointment was a telepsychological visit due to COVID-19. Cynthia [name] provided verbal consent for today's telepsychological appointment and she is aware she is responsible for securing confidentiality on her end of the session. Prior to proceeding with today's appointment, Cynthia [name]'s physical location at the time of this appointment was obtained as well a phone number she could be reached at in the event of technical difficulties. Cynthia [name] and this provider participated in today's telepsychological service.   This provider conducted a brief check-in. Cynthia [name] shared, "Work has been crazy, but I've been managing." She indicated she was able to obtain a refill for her [facility] prescription. Cynthia [name] has frequently discussed ongoing stressors, which has impacted her eating habits. As such, she requested during the last appointment to discuss relaxation techniques. Psychoeducation was provided regarding progressive muscle relaxation. She was engaged in an exercise and her experience was processed. Cynthia [name] provided verbal consent during today's appointment for this provider to send a handout with a progressive muscle relaxation exercise via e-mail. Furthermore, termination planning was discussed  as she initiating therapeutic services with [facility]. Overall, Cynthia [name] was receptive to today's appointment as evidenced by openness to sharing, responsiveness to feedback, and  willingness to practice progressive muscle relaxation .  Mental Status Examination:  Appearance: well groomed and appropriate hygiene  Behavior: appropriate to circumstances Mood: neutral Affect: mood congruent Speech: WNL Eye Contact: appropriate Psychomotor Activity: WNL Gait: unable to assess Thought Process: linear, logical, and goal directed and no evidence or endorsement of suicidal, homicidal, and self-harm ideation, plan and intent  Thought Content/Perception: no hallucinations, delusions, bizarre thinking or behavior endorsed or observed Orientation: AAOx4 Memory/Concentration: memory, attention, language, and fund of knowledge intact  Insight/Judgment: fair  Interventions:  Conducted a brief chart review Provided empathic reflections and validation Employed supportive psychotherapy interventions to facilitate reduced distress and to improve coping skills with identified stressors Psychoeducation provided regarding progressive muscle relaxation Engaged patient in a progressive muscle relaxation exercise Discussed termination planning  DSM-5 Diagnosis(es): F50.89 Other Specified Feeding or Eating Disorder, Emotional Eating Behaviors, F90.9 Unspecified Attention-Deficit/Hyperactivity Disorder , and F41.9 Unspecified Anxiety Disorder  Treatment Goal & Progress: During the initial appointment with this provider, the following treatment goal was established: increase coping skills. Cynthia [name] has demonstrated progress in her goal as evidenced by increased awareness of hunger patterns and increased awareness of triggers for emotional eating behaviors. Cynthia [name] also continues to demonstrate willingness to engage in learned skill(s).  Plan: Cynthia [name] will be initiating therapeutic services with [facility] on August 25, 2021.Per Cynthia [name]'s request, the next appointment will be scheduled in 4 weeks, which will be via [place] Visit. The next session will focus on working towards the established treatment goal and termination.

## 2021-08-14 ENCOUNTER — Ambulatory Visit (INDEPENDENT_AMBULATORY_CARE_PROVIDER_SITE_OTHER): Payer: BC Managed Care – PPO | Admitting: Family Medicine

## 2021-08-16 NOTE — Telephone Encounter (Signed)
Will address at OV 

## 2021-08-17 ENCOUNTER — Other Ambulatory Visit: Payer: Self-pay

## 2021-08-17 ENCOUNTER — Ambulatory Visit (INDEPENDENT_AMBULATORY_CARE_PROVIDER_SITE_OTHER): Payer: BC Managed Care – PPO | Admitting: Family Medicine

## 2021-08-17 ENCOUNTER — Encounter (INDEPENDENT_AMBULATORY_CARE_PROVIDER_SITE_OTHER): Payer: Self-pay | Admitting: Family Medicine

## 2021-08-17 VITALS — BP 116/82 | HR 110 | Temp 98.5°F | Ht 62.0 in | Wt 258.0 lb

## 2021-08-17 DIAGNOSIS — Z6841 Body Mass Index (BMI) 40.0 and over, adult: Secondary | ICD-10-CM | POA: Diagnosis not present

## 2021-08-17 DIAGNOSIS — F5081 Binge eating disorder: Secondary | ICD-10-CM

## 2021-08-17 DIAGNOSIS — E669 Obesity, unspecified: Secondary | ICD-10-CM | POA: Diagnosis not present

## 2021-08-17 DIAGNOSIS — R7301 Impaired fasting glucose: Secondary | ICD-10-CM

## 2021-08-17 MED ORDER — TIRZEPATIDE 10 MG/0.5ML ~~LOC~~ SOAJ: 10.0000 mg | SUBCUTANEOUS | 0 refills | Status: DC

## 2021-08-17 MED ORDER — VYVANSE 40 MG PO CHEW: 40.0000 mg | CHEWABLE_TABLET | Freq: Every day | ORAL | 0 refills | Status: DC

## 2021-08-17 MED ORDER — ONDANSETRON 4 MG PO TBDP: 4.0000 mg | ORAL_TABLET | Freq: Three times a day (TID) | ORAL | 0 refills | Status: AC | PRN

## 2021-08-22 ENCOUNTER — Telehealth (INDEPENDENT_AMBULATORY_CARE_PROVIDER_SITE_OTHER): Payer: BC Managed Care – PPO | Admitting: Psychology

## 2021-08-22 DIAGNOSIS — F419 Anxiety disorder, unspecified: Secondary | ICD-10-CM | POA: Diagnosis not present

## 2021-08-22 DIAGNOSIS — F909 Attention-deficit hyperactivity disorder, unspecified type: Secondary | ICD-10-CM

## 2021-08-22 DIAGNOSIS — F3289 Other specified depressive episodes: Secondary | ICD-10-CM | POA: Diagnosis not present

## 2021-08-22 NOTE — Progress Notes (Signed)
Chief Complaint:   OBESITY Cynthia [name] is here to discuss her progress with her obesity treatment plan along with follow-up of her obesity related diagnoses. See Medical Weight Management Flowsheet for complete bioelectrical impedance results.  Today's visit was #: 67 Starting weight: 264 lbs Starting date: 05/01/2017 Weight change since last visit: +3 lbs Total lbs lost to date: 6 lbs Total weight loss percentage to date: -2.27%  Nutrition Plan: Keeping a food journal and adhering to recommended goals of 1200 calories and 95 grams of protein daily for 80% of the time. Activity: Walking for 30 minutes 4-5 times per week.  Anti-obesity medications: Mounjaro 10 mg subcutaneously weekly. Reported side effects: None.  Interim History: Cynthia [name] says she has not had Mounjaro for a few weeks, but it should be in tomorrow.    Assessment/Plan:   1. Impaired fasting glucose [name] has not had any Mounjaro for a few weeks, but it will be in tomorrow.  Plan:  Refill Mounjaro 10 mg subcutaneously weekly and Zofran 4 mg every 8 hours as needed for nausea or vomiting.  - Refill tirzepatide (MOUNJARO) 10 MG/0.5ML Pen; Inject 10 mg into the skin once a week.  Dispense: 2 mL; Refill: 0 - Refill ondansetron (ZOFRAN ODT) 4 MG disintegrating tablet; Take 1 tablet (4 mg total) by mouth every 8 (eight) hours as needed for nausea or vomiting.  Dispense: 20 tablet; Refill: 0  2. Binge eating disorder [name] is taking Vyvanse 40 mg daily.   [name] is taking her medications as directed. She is struggling with emotional eating and using food for comfort to the extent that it is negatively impacting her health. She has been working on behavior modification techniques to help reduce her emotional eating.   - Refill Lisdexamfetamine Dimesylate (VYVANSE) 40 MG CHEW; Chew 40 mg by mouth daily.  Dispense: 30 tablet; Refill: 0 - Refill Lisdexamfetamine Dimesylate (VYVANSE) 40 MG CHEW; Chew 40 mg by mouth daily at 12  noon.  Dispense: 30 tablet; Refill: 0  I have consulted the [place] Controlled Substances Registry for this patient, and feel the risk/benefit ratio today is favorable for proceeding with this prescription for a controlled substance. The patient understands monitoring parameters and red flags.   3. Obesity, current BMI 47.3  Course: Cynthia [name] is currently in the action stage of change. As such, her goal is to continue with weight loss efforts.   Nutrition goals: She has agreed to keeping a food journal and adhering to recommended goals of 1200 calories and 95 grams of protein.   Exercise goals:  As is.  Behavioral modification strategies: increasing lean protein intake, decreasing simple carbohydrates, and increasing vegetables.  Cynthia [name] has agreed to follow-up with our clinic in 4 weeks. She was informed of the importance of frequent follow-up visits to maximize her success with intensive lifestyle modifications for her multiple health conditions.   Cynthia [name] was informed we would discuss her lab results at her next visit unless there is a critical issue that needs to be addressed sooner. Cynthia [name] agreed to keep her next visit at the agreed upon time to discuss these results.  Objective:   Blood pressure 116/82, pulse (!) 110, temperature 98.5 F (36.9 C), temperature source Oral, height 5\' 2"  (1.575 m), weight 258 lb (117 kg), SpO2 96 %. Body mass index is 47.19 kg/m.  General: Cooperative, alert, well developed, in no acute distress. HEENT: Conjunctivae and lids unremarkable. Cardiovascular: Regular rhythm.  Lungs: Normal work of breathing. Neurologic: No focal deficits.  Lab Results  Component Value Date   CREATININE 0.73 12/07/2020   BUN 17 12/07/2020   NA 138 12/07/2020   K 4.4 12/07/2020   CL 102 12/07/2020   CO2 18 (L) 12/07/2020   Lab Results  Component Value Date   ALT 10 12/07/2020   AST 13 12/07/2020   ALKPHOS 86 12/07/2020   BILITOT 0.2 12/07/2020   Lab Results  Component  Value Date   HGBA1C 5.5 12/07/2020   HGBA1C 5.3 06/29/2020   HGBA1C 5.4 06/29/2019   HGBA1C 5.4 10/30/2018   HGBA1C 5.5 01/16/2018   Lab Results  Component Value Date   INSULIN 10.3 12/07/2020   INSULIN 13.7 06/29/2020   INSULIN 12.3 01/06/2020   INSULIN 17.7 06/29/2019   INSULIN 29.3 (H) 10/30/2018   Lab Results  Component Value Date   TSH 1.610 12/07/2020   Lab Results  Component Value Date   CHOL 166 12/07/2020   HDL 55 12/07/2020   LDLCALC 99 12/07/2020   TRIG 59 12/07/2020   CHOLHDL 3.0 12/07/2020   Lab Results  Component Value Date   VD25OH 23.7 (L) 12/07/2020   VD25OH 29.3 (L) 06/29/2020   VD25OH 27.6 (L) 01/06/2020   Lab Results  Component Value Date   WBC 9.9 12/07/2020   HGB 13.6 12/07/2020   HCT 42.2 12/07/2020   MCV 89 12/07/2020   PLT 278 12/07/2020   Lab Results  Component Value Date   IRON 60 12/07/2020   TIBC 301 12/07/2020   FERRITIN 94 12/07/2020   Attestation Statements:   Reviewed by clinician on day of visit: allergies, medications, problem list, medical history, surgical history, family history, social history, and previous encounter notes.  I, Insurance claims handler, CMA, am acting as transcriptionist for Helane Rima, DO  I have reviewed the above documentation for accuracy and completeness, and I agree with the above. -  Helane Rima, DO, MS, FAAFP, DABOM - Family and Bariatric Medicine.

## 2021-08-25 ENCOUNTER — Other Ambulatory Visit: Payer: Self-pay

## 2021-08-25 ENCOUNTER — Ambulatory Visit (INDEPENDENT_AMBULATORY_CARE_PROVIDER_SITE_OTHER): Payer: BC Managed Care – PPO | Admitting: Psychologist

## 2021-08-25 DIAGNOSIS — F411 Generalized anxiety disorder: Secondary | ICD-10-CM
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
## 2021-08-25 NOTE — Plan of Care (Signed)

## 2021-08-25 NOTE — Progress Notes (Signed)
                Saloma Cadena, PsyD 

## 2021-08-30 DIAGNOSIS — R12 Heartburn: Secondary | ICD-10-CM | POA: Diagnosis not present

## 2021-08-30 DIAGNOSIS — E559 Vitamin D deficiency, unspecified: Secondary | ICD-10-CM | POA: Diagnosis not present

## 2021-08-30 DIAGNOSIS — G4700 Insomnia, unspecified: Secondary | ICD-10-CM | POA: Diagnosis not present

## 2021-08-30 DIAGNOSIS — F410 Panic disorder [episodic paroxysmal anxiety] without agoraphobia: Secondary | ICD-10-CM | POA: Diagnosis not present

## 2021-09-05 NOTE — Progress Notes (Signed)
°  Office: [phone]  /  Fax: [phone]    Date: September 19, 2021   Appointment Start Time: 8:04am Duration: 30 minutes Provider: Lawerance Cruel, Psy.D. Type of Session: Individual Therapy  Location of Patient: Home (private location)  Location of Provider: Provider's Home (private office) Type of Contact: Telepsychological Visit via MyChart Video Visit  Session Content: Cynthia [name] is a 36 y.o. female presenting for a follow-up appointment to address the previously established treatment goal of increasing coping skills.Today's appointment was a telepsychological visit due to COVID-19. Cynthia [name] provided verbal consent for today's telepsychological appointment and she is aware she is responsible for securing confidentiality on her end of the session. Prior to proceeding with today's appointment, Cynthia [name]'s physical location at the time of this appointment was obtained as well a phone number she could be reached at in the event of technical difficulties. Cynthia [name] and this provider participated in today's telepsychological service.   This provider conducted a brief check-in. Cynthia [name] shared about recent updates, noting she was prescribed Xanax by her PCP. She indicated she is doing well with her eating habits, adding she is taking Mounjaro and engaging in some meal prep. Additionally, Cynthia [name] discussed her appointments with her primary therapist. Reflected on progress to date and briefly discussed assertive communication. Cynthia [name] was receptive to today's appointment as evidenced by openness to sharing, responsiveness to feedback, and willingness to continue engaging in learned skills.  Mental Status Examination:  Appearance: neat Behavior: appropriate to circumstances Mood: neutral Affect: mood congruent Speech: WNL Eye Contact: appropriate Psychomotor Activity: WNL Gait: unable to assess Thought Process: linear, logical, and goal directed and no evidence or endorsement of suicidal, homicidal, and self-harm  ideation, plan and intent  Thought Content/Perception: no hallucinations, delusions, bizarre thinking or behavior endorsed or observed Orientation: AAOx4 Memory/Concentration: memory, attention, language, and fund of knowledge intact  Insight: good Judgment: good  Interventions:  Conducted a brief chart review Provided empathic reflections and validation Employed supportive psychotherapy interventions to facilitate reduced distress and to improve coping skills with identified stressors Reviewed learned skills  DSM-5 Diagnosis(es):  F50.89 Other Specified Feeding or Eating Disorder, Emotional Eating Behaviors, F90.9 Unspecified Attention-Deficit/Hyperactivity Disorder , and F41.9 Unspecified Anxiety Disorder  Treatment Goal & Progress: During the initial appointment with this provider, the following treatment goal was established: increase coping skills. Cynthia [name] demonstrated progress in her goal as evidenced by increased awareness of hunger patterns and increased awareness of triggers for emotional eating behaviors. Cynthia [name] also continues to demonstrate willingness to engage in learned skill(s).  Plan: As previously planned, today was Cynthia [name]'s last appointment with this provider. She acknowledged understanding that she may request a follow-up appointment with this provider in the future as long as she is still established with the clinic. She will continue working with her primary therapist. No further follow-up planned by this provider.

## 2021-09-06 ENCOUNTER — Other Ambulatory Visit (HOSPITAL_COMMUNITY): Payer: Self-pay

## 2021-09-14 ENCOUNTER — Ambulatory Visit (INDEPENDENT_AMBULATORY_CARE_PROVIDER_SITE_OTHER): Payer: BC Managed Care – PPO | Admitting: Family Medicine

## 2021-09-14 ENCOUNTER — Encounter (INDEPENDENT_AMBULATORY_CARE_PROVIDER_SITE_OTHER): Payer: Self-pay | Admitting: Family Medicine

## 2021-09-14 ENCOUNTER — Other Ambulatory Visit: Payer: Self-pay

## 2021-09-14 VITALS — BP 116/78 | HR 102 | Temp 98.2°F | Ht 62.0 in | Wt 255.0 lb

## 2021-09-14 DIAGNOSIS — M2550 Pain in unspecified joint: Secondary | ICD-10-CM

## 2021-09-14 DIAGNOSIS — R5381 Other malaise: Secondary | ICD-10-CM | POA: Diagnosis not present

## 2021-09-14 DIAGNOSIS — E669 Obesity, unspecified: Secondary | ICD-10-CM

## 2021-09-14 DIAGNOSIS — R7301 Impaired fasting glucose: Secondary | ICD-10-CM

## 2021-09-14 DIAGNOSIS — E8881 Metabolic syndrome: Secondary | ICD-10-CM

## 2021-09-14 DIAGNOSIS — E559 Vitamin D deficiency, unspecified: Secondary | ICD-10-CM | POA: Diagnosis not present

## 2021-09-14 DIAGNOSIS — R5383 Other fatigue: Secondary | ICD-10-CM | POA: Diagnosis not present

## 2021-09-14 DIAGNOSIS — Z6841 Body Mass Index (BMI) 40.0 and over, adult: Secondary | ICD-10-CM

## 2021-09-14 MED ORDER — TIRZEPATIDE 7.5 MG/0.5ML ~~LOC~~ SOAJ: 7.5000 mg | SUBCUTANEOUS | 0 refills | Status: DC

## 2021-09-15 LAB — COMPREHENSIVE METABOLIC PANEL
ALT: 12 IU/L (ref 0–32)
AST: 13 IU/L (ref 0–40)
Albumin/Globulin Ratio: 1.7 (ref 1.2–2.2)
Albumin: 4.1 g/dL (ref 3.8–4.8)
Alkaline Phosphatase: 83 IU/L (ref 44–121)
BUN/Creatinine Ratio: 16 (ref 9–23)
BUN: 12 mg/dL (ref 6–20)
Bilirubin Total: 0.2 mg/dL (ref 0.0–1.2)
CO2: 21 mmol/L (ref 20–29)
Calcium: 8.8 mg/dL (ref 8.7–10.2)
Chloride: 103 mmol/L (ref 96–106)
Creatinine, Ser: 0.73 mg/dL (ref 0.57–1.00)
Globulin, Total: 2.4 g/dL (ref 1.5–4.5)
Glucose: 91 mg/dL (ref 70–99)
Potassium: 4.1 mmol/L (ref 3.5–5.2)
Sodium: 140 mmol/L (ref 134–144)
Total Protein: 6.5 g/dL (ref 6.0–8.5)
eGFR: 110 mL/min/{1.73_m2} (ref >59)

## 2021-09-15 LAB — CBC WITH DIFFERENTIAL/PLATELET
Basophils Absolute: 0 10*3/uL (ref 0.0–0.2)
Basos: 1 %
EOS (ABSOLUTE): 0.1 10*3/uL (ref 0.0–0.4)
Eos: 1 %
Hemoglobin: 14.1 g/dL (ref 11.1–15.9)
Immature Grans (Abs): 0 10*3/uL (ref 0.0–0.1)
Immature Granulocytes: 0 %
Lymphocytes Absolute: 1.4 10*3/uL (ref 0.7–3.1)
Lymphs: 19 %
MCH: 29.2 pg (ref 26.6–33.0)
MCHC: 32.6 g/dL (ref 31.5–35.7)
MCV: 89 fL (ref 79–97)
Monocytes Absolute: 0.5 10*3/uL (ref 0.1–0.9)
Monocytes: 7 %
Neutrophils Absolute: 5.4 10*3/uL (ref 1.4–7.0)
Neutrophils: 72 %
Platelets: 268 10*3/uL (ref 150–450)
RBC: 4.83 x10E6/uL (ref 3.77–5.28)
RDW: 13 % (ref 11.7–15.4)
WBC: 7.5 10*3/uL (ref 3.4–10.8)

## 2021-09-15 LAB — LIPID PANEL WITH LDL/HDL RATIO
Cholesterol, Total: 153 mg/dL (ref 100–199)
HDL: 56 mg/dL (ref >39)
LDL Chol Calc (NIH): 85 mg/dL (ref 0–99)
LDL/HDL Ratio: 1.5 ratio (ref 0.0–3.2)
Triglycerides: 57 mg/dL (ref 0–149)
VLDL Cholesterol Cal: 12 mg/dL (ref 5–40)

## 2021-09-15 LAB — ANEMIA PANEL
Ferritin: 94 ng/mL (ref 15–150)
Folate, Hemolysate: 355 ng/mL
Folate, RBC: 822 ng/mL (ref >498)
Hematocrit: 43.2 % (ref 34.0–46.6)
Iron Saturation: 19 % (ref 15–55)
Iron: 56 ug/dL (ref 27–159)
Retic Ct Pct: 1.5 % (ref 0.6–2.6)
Total Iron Binding Capacity: 301 ug/dL (ref 250–450)
UIBC: 245 ug/dL (ref 131–425)
Vitamin B-12: 308 pg/mL (ref 232–1245)

## 2021-09-15 LAB — C-REACTIVE PROTEIN: CRP: 25 mg/L — ABNORMAL HIGH (ref 0–10)

## 2021-09-15 LAB — VITAMIN D 25 HYDROXY (VIT D DEFICIENCY, FRACTURES): Vit D, 25-Hydroxy: 37.2 ng/mL (ref 30.0–100.0)

## 2021-09-15 LAB — HEMOGLOBIN A1C
Est. average glucose Bld gHb Est-mCnc: 108 mg/dL
Hgb A1c MFr Bld: 5.4 % (ref 4.8–5.6)

## 2021-09-15 LAB — SEDIMENTATION RATE: Sed Rate: 16 mm/hr (ref 0–32)

## 2021-09-18 ENCOUNTER — Ambulatory Visit (INDEPENDENT_AMBULATORY_CARE_PROVIDER_SITE_OTHER): Payer: BC Managed Care – PPO | Admitting: Psychologist

## 2021-09-18 DIAGNOSIS — F411 Generalized anxiety disorder: Secondary | ICD-10-CM

## 2021-09-18 NOTE — Progress Notes (Signed)
[facility] Counselor/Therapist Progress Note  Patient ID: Cynthia [name], MRN: [id number],    Date: 09/18/2021  Time Spent: 8:05 am to 8:45 am; total time: 40 minutes   This session was held via video webex teletherapy due to the coronavirus risk at this time. The patient consented to video teletherapy and was located at her home during this session. She is aware it is the responsibility of the patient to secure confidentiality on her end of the session. The provider was in a private home office for the duration of this session. Limits of confidentiality were discussed with the patient.   Treatment Type: Individual Therapy  Reported Symptoms: Anxiety  Mental Status Exam: Appearance:  Well Groomed     Behavior: Appropriate  Motor: Normal  Speech/Language:  Clear and Coherent  Affect: Appropriate  Mood: normal  Thought process: normal  Thought content:   WNL  Sensory/Perceptual disturbances:   WNL  Orientation: oriented to person, place, and time/date  Attention: Good  Concentration: Good  Memory: WNL  Fund of knowledge:  Good  Insight:   Fair  Judgment:  Fair  Impulse Control: Good   Risk Assessment: Danger to Self:  No Self-injurious Behavior: No Danger to Others: No Duty to Warn:no Physical Aggression / Violence:No  Access to Firearms a concern: No  Gang Involvement:No   Subjective: Beginning the session, the patient described herself as doing better. After reviewing the treatment plan, she reflected on events since the intake. From there, she stated that she wanted coping strategies. After discussing, processing, and practicing coping strategies, the patient described herself as better. She was agreeable to homework and following up. She denied suicidal and homicidal ideation.    Interventions:  Worked on developing a therapeutic relationship with the patient using active listening and reflective statements. Provided emotional support using empathy and  validation. Reviewed the treatment plan with the patient. Reviewed events since the intake. Validated expressed thoughts and emotions. Identified goals for the session. Provided psychoeducation about mindfulness, mindful moments, mindful videos, defusion, guided imagery, and a wandering mind. Practiced and processed mindfulness, defusion, and guided imagery. Praised patient for experiencing less distress. Assisted in problem solving. Provided psychoeducation about practicing three times daily for 2-3 minutes with mindfulness. Assigned homework. Assessed for suicidal and homicidal ideation.   Homework: Implement mindfulness, defusion, and guided imagery   Next Session: Review homework and emotional support  Diagnosis: F41.1 generalized anxiety disorder  Plan:   Client Abilities: Friendly and easy to develop rapport  Client Preferences: ACT and CBT  Client statement of Needs: Coping skills  Treatment Level: Outpatient  Goals Reduce overall frequency, intensity, and duration of anxiety Stabilize anxiety level wile increasing ability to function Enhance ability to effectively cope with full variety of stressors Learn and implement coping skills that result in a reduction of anxiety   Objectives target date for all objectives is 08/25/2022 Verbalize an understanding of the cognitive, physiological, and behavioral components of anxiety Learning and implement calming skills to reduce overall anxiety Verbalize an understanding of the role that cognitive biases play in excessive irrational worry and persistent anxiety symptoms Identify, challenge, and replace based fearful talk Learn and implement problem solving strategies Identify and engage in pleasant activities Learning and implement personal and interpersonal skills to reduce anxiety and improve interpersonal relationships Learn to accept limitations in life and commit to tolerating, rather than avoiding, unpleasant emotions while  accomplishing meaningful goals Identify major life conflicts from the past and present that form the  basis for present anxiety Maintain involvement in work, family, and social activities Reestablish a consistent sleep-wake cycle Cooperate with a medical evaluation  Interventions Engage the patient in behavioral activation Use instruction, modeling, and role-playing to build the client's general social, communication, and/or conflict resolution skills Use Acceptance and Commitment Therapy to help client accept uncomfortable realities in order to accomplish value-consistent goals Reinforce the client's insight into the role of his/her past emotional pain and present anxiety  Support the client in following through with work, family, and social activities Teach and implement sleep hygiene practices  Refer the patient to a physician for a psychotropic medication consultation Monito the clint's psychotropic medication compliance Discuss how anxiety typically involves excessive worry, various bodily expressions of tension, and avoidance of what is threatening that interact to maintain the problem  Teach the patient relaxation skills Assign the patient homework Discuss examples demonstrating that unrealistic worry overestimates the probability of threats and underestimates patient's ability  Assist the patient in analyzing his or her worries Help patient understand that avoidance is reinforcing   The patient and clinician reviewed the treatment plan on 09/18/2021. The patient approved of the treatment plan.   Conception Chancy, PsyD

## 2021-09-18 NOTE — Progress Notes (Signed)
                Denna Fryberger, PsyD 

## 2021-09-18 NOTE — Progress Notes (Signed)
Chief Complaint:   OBESITY Farren is here to discuss her progress with her obesity treatment plan along with follow-up of her obesity related diagnoses. See Medical Weight Management Flowsheet for complete bioelectrical impedance results.  Today's visit was #: 68 Starting weight: 264 lbs Starting date: 05/01/2017 Weight change since last visit: 3 lbs Total lbs lost to date: 9 lbs Total weight loss percentage to date: -3.41%  Nutrition Plan: Keeping a food journal and adhering to recommended goals of 1200 calories and 95 grams of protein daily for 75% of the time. Activity: Walking for 30 minutes 7 times per week. Anti-obesity medications: Mounjaro 10 mg subcutaneously weekly. Reported side effects: None.  Interim History: Koralynn says she has been having some increased anxiety and was prescribed Xanax by her PCP.  Her boss is supportive, she says.  She has had an increase in her GERD symptoms and has been taking omeprazole for 2 weeks now.  She is currently fasting for Malena Edman - 2 protein drinks, shrimp - 99 grams of protein.  Assessment/Plan:   1. Impaired fasting glucose Janan is taking Mounjaro 10 mg subcutaneously weekly.  Plan:  Refill Mounjaro 7.5 mg subcutaneously weekly.  - Refill tirzepatide (MOUNJARO) 7.5 MG/0.5ML Pen; Inject 7.5 mg into the skin once a week.  Dispense: 2 mL; Refill: 0  2. Multiple joint pain Will check labs today, as per below.  - CBC with Differential/Platelet - Sed Rate (ESR) - C-reactive protein  3. Vitamin D deficiency Not at goal. She is taking vitamin D 50,000 IU weekly.  Plan: Continue to take prescription Vitamin D '@50' ,000 IU every week as prescribed.  We will check her vitamin D level today.  Lab Results  Component Value Date   VD25OH 23.7 (L) 12/07/2020   VD25OH 29.3 (L) 06/29/2020   - VITAMIN D 25 Hydroxy (Vit-D Deficiency, Fractures)  4. Insulin resistance Improving, but not optimized. Goal is HgbA1c < 5.7, fasting insulin  closer to 5.  Medication: Mounjaro 10 mg subcutaneously weekly.    Plan:  She will continue to focus on protein-rich, low simple carbohydrate foods. We reviewed the importance of hydration, regular exercise for stress reduction, and restorative sleep.   Lab Results  Component Value Date   INSULIN 10.3 12/07/2020   INSULIN 13.7 06/29/2020   INSULIN 12.3 01/06/2020   INSULIN 17.7 06/29/2019   INSULIN 29.3 (H) 10/30/2018   - Comprehensive metabolic panel - Hemoglobin A1c  5. Malaise and fatigue Will check CBC, lipid panel, and anemia panel today.  - CBC with Differential/Platelet - Lipid Panel With LDL/HDL Ratio - Anemia panel  6. Obesity, current BMI 46.7  Course: Jeannett is currently in the action stage of change. As such, her goal is to continue with weight loss efforts.   Nutrition goals: She has agreed to keeping a food journal and adhering to recommended goals of 1200 calories and 95 grams of protein.   Exercise goals:  As is.  Behavioral modification strategies: increasing lean protein intake, decreasing simple carbohydrates, increasing vegetables, increasing water intake, and decreasing liquid calories.  Jaxson has agreed to follow-up with our clinic in 4 weeks. She was informed of the importance of frequent follow-up visits to maximize her success with intensive lifestyle modifications for her multiple health conditions.   Willow was informed we would discuss her lab results at her next visit unless there is a critical issue that needs to be addressed sooner. Alanis agreed to keep her next visit at the agreed upon  time to discuss these results.  Objective:   Blood pressure 116/78, pulse (!) 102, temperature 98.2 F (36.8 C), temperature source Oral, height '5\' 2"'  (1.575 m), weight 255 lb (115.7 kg), SpO2 97 %. Body mass index is 46.64 kg/m.  General: Cooperative, alert, well developed, in no acute distress. HEENT: Conjunctivae and lids unremarkable. Cardiovascular:  Regular rhythm.  Lungs: Normal work of breathing. Neurologic: No focal deficits.   Lab Results  Component Value Date   HGBA1C 5.5 12/07/2020   HGBA1C 5.3 06/29/2020   HGBA1C 5.4 06/29/2019   HGBA1C 5.4 10/30/2018   Lab Results  Component Value Date   INSULIN 10.3 12/07/2020   INSULIN 13.7 06/29/2020   INSULIN 12.3 01/06/2020   INSULIN 17.7 06/29/2019   INSULIN 29.3 (H) 10/30/2018   Lab Results  Component Value Date   TSH 1.610 12/07/2020   Lab Results  Component Value Date   VD25OH 23.7 (L) 12/07/2020   VD25OH 29.3 (L) 06/29/2020   Attestation Statements:   Reviewed by clinician on day of visit: allergies, medications, problem list, medical history, surgical history, family history, social history, and previous encounter notes.  I, Water quality scientist, CMA, am acting as transcriptionist for Briscoe Deutscher, DO  I have reviewed the above documentation for accuracy and completeness, and I agree with the above. -  Briscoe Deutscher, DO, MS, FAAFP, DABOM - Family and Bariatric Medicine.

## 2021-09-19 ENCOUNTER — Telehealth (INDEPENDENT_AMBULATORY_CARE_PROVIDER_SITE_OTHER): Payer: BC Managed Care – PPO | Admitting: Psychology

## 2021-09-19 DIAGNOSIS — F3289 Other specified depressive episodes: Secondary | ICD-10-CM

## 2021-09-19 DIAGNOSIS — F419 Anxiety disorder, unspecified: Secondary | ICD-10-CM | POA: Diagnosis not present

## 2021-09-19 DIAGNOSIS — F909 Attention-deficit hyperactivity disorder, unspecified type: Secondary | ICD-10-CM

## 2021-10-11 ENCOUNTER — Encounter (INDEPENDENT_AMBULATORY_CARE_PROVIDER_SITE_OTHER): Payer: Self-pay | Admitting: Adult Health

## 2021-10-11 ENCOUNTER — Ambulatory Visit (INDEPENDENT_AMBULATORY_CARE_PROVIDER_SITE_OTHER): Payer: BC Managed Care – PPO | Admitting: Adult Health

## 2021-10-11 ENCOUNTER — Ambulatory Visit (INDEPENDENT_AMBULATORY_CARE_PROVIDER_SITE_OTHER): Payer: BC Managed Care – PPO | Admitting: Family Medicine

## 2021-10-11 ENCOUNTER — Other Ambulatory Visit: Payer: Self-pay

## 2021-10-11 VITALS — BP 113/69 | HR 107 | Temp 98.6°F | Ht 62.0 in | Wt 255.0 lb

## 2021-10-11 DIAGNOSIS — F5081 Binge eating disorder: Secondary | ICD-10-CM | POA: Diagnosis not present

## 2021-10-11 DIAGNOSIS — Z9189 Other specified personal risk factors, not elsewhere classified: Secondary | ICD-10-CM

## 2021-10-11 DIAGNOSIS — E669 Obesity, unspecified: Secondary | ICD-10-CM

## 2021-10-11 DIAGNOSIS — R000 Tachycardia, unspecified: Secondary | ICD-10-CM | POA: Insufficient documentation

## 2021-10-11 DIAGNOSIS — Z6841 Body Mass Index (BMI) 40.0 and over, adult: Secondary | ICD-10-CM

## 2021-10-11 DIAGNOSIS — R7301 Impaired fasting glucose: Secondary | ICD-10-CM | POA: Diagnosis not present

## 2021-10-11 DIAGNOSIS — F411 Generalized anxiety disorder: Secondary | ICD-10-CM

## 2021-10-11 MED ORDER — FLUOXETINE HCL 40 MG PO CAPS: 40.0000 mg | ORAL_CAPSULE | Freq: Every day | ORAL | 0 refills | Status: DC

## 2021-10-11 MED ORDER — TIRZEPATIDE 7.5 MG/0.5ML ~~LOC~~ SOAJ: 7.5000 mg | SUBCUTANEOUS | 0 refills | Status: DC

## 2021-10-12 ENCOUNTER — Ambulatory Visit (INDEPENDENT_AMBULATORY_CARE_PROVIDER_SITE_OTHER): Payer: BC Managed Care – PPO | Admitting: Psychologist

## 2021-10-12 ENCOUNTER — Telehealth (INDEPENDENT_AMBULATORY_CARE_PROVIDER_SITE_OTHER): Payer: Self-pay

## 2021-10-12 DIAGNOSIS — F411 Generalized anxiety disorder: Secondary | ICD-10-CM

## 2021-10-12 NOTE — Progress Notes (Signed)
[facility] Counselor/Therapist Progress Note ? ?Patient ID: Cynthia [name], MRN: [id number],   ? ?Date: 10/12/2021 ? ?Time Spent: 9:43 am to 10:00 am; total time: 17 minutes ? ? This session was held via video webex teletherapy due to the coronavirus risk at this time. The patient consented to video teletherapy and was located at her home during this session. She is aware it is the responsibility of the patient to secure confidentiality on her end of the session. The provider was in a private home office for the duration of this session. Limits of confidentiality were discussed with the patient.  ? ?Treatment Type: Individual Therapy ? ?Reported Symptoms: Anxiety ? ?Mental Status Exam: ?Appearance:  Well Groomed     ?Behavior: Appropriate  ?Motor: Normal  ?Speech/Language:  Clear and Coherent  ?Affect: Appropriate  ?Mood: normal  ?Thought process: normal  ?Thought content:   WNL  ?Sensory/Perceptual disturbances:   WNL  ?Orientation: oriented to person, place, and time/date  ?Attention: Good  ?Concentration: Good  ?Memory: WNL  ?Fund of knowledge:  Good  ?Insight:   Fair  ?Judgment:  Fair  ?Impulse Control: Good  ? ?Risk Assessment: ?Danger to Self:  No ?Self-injurious Behavior: No ?Danger to Others: No ?Duty to Warn:no ?Physical Aggression / Violence:No  ?Access to Firearms a concern: No  ?Gang Involvement:No  ? ?Subjective: Beginning the session, the patient stated that her work is in a state of transition which has caused more stress. She processed thoughts and emotions. She reflected on coping strategies that have helped her. She also reflected briefly on how to implement self-care into her life. She also explored how to implement more water into her daily schedule as well as eating more protein. She was agreeable to homework and following up. She denied suicidal and homicidal ideation.   ? ?Interventions:  Worked on developing a therapeutic relationship with the patient using active listening  and reflective statements. Provided emotional support using empathy and validation. Used summary statements. Normalized and validated expressed thoughts and emotions. Used socratic questions to assist the patient gain insight into self. Praised patient for doing better. Briefly explored ways to implement self-care. Processed ways to implement more water and protein into day to day function. Assisted in problem solving. Provided empathic statements. Assigned homework. Assessed for suicidal and homicidal ideation.  ? ?Homework: Increase water and protein use ? ? ?Next Session: Review homework. Discuss how to implement self-care. Emotional support.  ? ?Diagnosis: F41.1 generalized anxiety disorder ? ?Plan:  ? ?Client Abilities: Friendly and easy to develop rapport ? ?Client Preferences: ACT and CBT ? ?Client statement of Needs: Coping skills ? ?Treatment Level: Outpatient ? ?Goals ?Reduce overall frequency, intensity, and duration of anxiety ?Stabilize anxiety level wile increasing ability to function ?Enhance ability to effectively cope with full variety of stressors ?Learn and implement coping skills that result in a reduction of anxiety  ? ?Objectives target date for all objectives is 08/25/2022 ?Verbalize an understanding of the cognitive, physiological, and behavioral components of anxiety ?Learning and implement calming skills to reduce overall anxiety ?Verbalize an understanding of the role that cognitive biases play in excessive irrational worry and persistent anxiety symptoms ?Identify, challenge, and replace based fearful talk ?Learn and implement problem solving strategies ?Identify and engage in pleasant activities ?Learning and implement personal and interpersonal skills to reduce anxiety and improve interpersonal relationships ?Learn to accept limitations in life and commit to tolerating, rather than avoiding, unpleasant emotions while accomplishing meaningful goals ?Identify major life conflicts  from the  past and present that form the basis for present anxiety ?Maintain involvement in work, family, and social activities ?Reestablish a consistent sleep-wake cycle ?Cooperate with a medical evaluation  ?Interventions ?Engage the patient in behavioral activation ?Use instruction, modeling, and role-playing to build the client's general social, communication, and/or conflict resolution skills ?Use Acceptance and Commitment Therapy to help client accept uncomfortable realities in order to accomplish value-consistent goals ?Reinforce the client's insight into the role of his/her past emotional pain and present anxiety  ?Support the client in following through with work, family, and social activities ?Teach and implement sleep hygiene practices  ?Refer the patient to a physician for a psychotropic medication consultation ?Monito the clint's psychotropic medication compliance ?Discuss how anxiety typically involves excessive worry, various bodily expressions of tension, and avoidance of what is threatening that interact to maintain the problem  ?Teach the patient relaxation skills ?Assign the patient homework ?Discuss examples demonstrating that unrealistic worry overestimates the probability of threats and underestimates patient's ability  ?Assist the patient in analyzing his or her worries ?Help patient understand that avoidance is reinforcing  ? ?The patient and clinician reviewed the treatment plan on 09/18/2021. The patient approved of the treatment plan.  ? ?Hilbert Corrigan, PsyD ? ? ? ?

## 2021-10-12 NOTE — Progress Notes (Signed)
? ? ? ?Chief Complaint:  ? ?OBESITY ?Cynthia [name] is here to discuss her progress with her obesity treatment plan along with follow-up of her obesity related diagnoses. Cynthia [name] is on keeping a food journal and adhering to recommended goals of 1200 calories and 95 grams of protein and states she is following her eating plan approximately 75% of the time. Cynthia [name] states she is walking for 30 minutes 7 times per week. ? ?Today's visit was #: 72 ?Starting weight: 264 lbs ?Starting date: 05/01/2017 ?Today's weight: 255 lbs ?Today's date: 10/11/2021 ?Total lbs lost to date: 9 lbs ?Total lbs lost since last in-office visit: 0 ? ?Interim History:  ?Cynthia [name] will have a new health care plan on October 21, 2021 - she believes that the new coverage will be limited, especially with prescription coverage. ? ?Of note:  On/off ADHD Rx since age 42/currently 36 years old. ? ?Subjective:  ? ?1. Impaired fasting glucose ?Started Mounjaro 2.5 mg on 02/26/2022.  ?She increased Mounjaro 7.5 mg on 10/07/2020. ?She has never used the [employer] 10mg  Rx- she reports having multiple Mounjaro Rx's at varying dosages due to concerns with supply chain issues. ?She denies mass in neck, dysphagia, dyspepsia, persistent hoarseness, abd pain, or N/V/Constipation. ? ?2. Increased heart rate ?HR today- 107. ?Epic review HR >100 on 12 out of last 14 checks.   ?Highest BMP 151.     ?No acute cardiac symptoms. ?Daily caffeine intake- 1 x Diet Pepsi. ? ?Family history - father - hypertension, hyperlipidemia. ?Mother - Obesity, MS, hyperlipidemia. ? ?Per Apple watch, resting HR 51-77.  Active HR 107-164. ? ?3. Binge eating disorder ?She takes Vyvanse 40 mg chew - 1/2 tablet upon walking, 1/2 tablet at lunch.   ?If she misses noon dose - will experience RLS symptoms.   ?PDMP reviewed - no aberrancies noted. ? ?4. GAD (generalized anxiety disorder) ?Fluoxetine 40 mg daily - anxiety and depression. ?She reports stable mood, denies SI/HI. ?Recently established with new therapist.   ? ?5. At risk for deficient intake of food ?Cynthia [name] is at risk for deficient food intake due to taking Mounjaro for IFG. ? ?Assessment/Plan:  ? ?1. Impaired fasting glucose ?Need to be on Mounjaro 7.5 mg 4 doses before increasing. ?Refill Mounjaro 7.5 mg once weekly. ? ?- Refill tirzepatide (MOUNJARO) 7.5 MG/0.5ML Pen; Inject 7.5 mg into the skin once a week.  Dispense: 2 mL; Refill: 0 ? ?2. Increased heart rate ?Limit caffeine. ?Monitor HR. ? ?3. Binge eating disorder ?Refill request sent to Dr. Maralyn Sago. ? ?4. GAD (generalized anxiety disorder) ?Refill fluoxetine 40 mg daily. ? ?- Refill FLUoxetine (PROZAC) 40 MG capsule; Take 1 capsule (40 mg total) by mouth daily.  Dispense: 90 capsule; Refill: 0 ? ?5. At risk for deficient intake of food ?Cynthia [name] was given approximately 15 minutes of deficient intake of food prevention counseling today. Apolonia is at risk for eating too few calories based on current food recall. She was encouraged to focus on meeting caloric and protein goals according to her recommended meal plan.  ? ?6. Obesity, current BMI 46.7 ? ?Cynthia [name] is currently in the action stage of change. As such, her goal is to continue with weight loss efforts. She has agreed to keeping a food journal and adhering to recommended goals of 1200 calories and 95 grams of protein.  ? ?Exercise goals:  As is. ? ?Behavioral modification strategies: increasing lean protein intake, decreasing simple carbohydrates, meal planning and cooking strategies, keeping healthy foods in the home, and planning for  success. ? ?Cynthia [name] has agreed to follow-up with our clinic in 4 weeks. She was informed of the importance of frequent follow-up visits to maximize her success with intensive lifestyle modifications for her multiple health conditions.  ? ?Objective:  ? ?Blood pressure 113/69, pulse (!) 107, temperature 98.6 ?F (37 ?C), height 5\' 2"  (1.575 m), weight 255 lb (115.7 kg), SpO2 97 %. ?Body mass index is 46.64 kg/m?. ? ?General:  Cooperative, alert, well developed, in no acute distress. ?HEENT: Conjunctivae and lids unremarkable. ?Cardiovascular: Regular rhythm.  ?Lungs: Normal work of breathing. ?Neurologic: No focal deficits.  ? ?Lab Results  ?Component Value Date  ? CREATININE 0.73 09/14/2021  ? BUN 12 09/14/2021  ? NA 140 09/14/2021  ? K 4.1 09/14/2021  ? CL 103 09/14/2021  ? CO2 21 09/14/2021  ? ?Lab Results  ?Component Value Date  ? ALT 12 09/14/2021  ? AST 13 09/14/2021  ? ALKPHOS 83 09/14/2021  ? BILITOT 0.2 09/14/2021  ? ?Lab Results  ?Component Value Date  ? HGBA1C 5.4 09/14/2021  ? HGBA1C 5.5 12/07/2020  ? HGBA1C 5.3 06/29/2020  ? HGBA1C 5.4 06/29/2019  ? HGBA1C 5.4 10/30/2018  ? ?Lab Results  ?Component Value Date  ? INSULIN 10.3 12/07/2020  ? INSULIN 13.7 06/29/2020  ? INSULIN 12.3 01/06/2020  ? INSULIN 17.7 06/29/2019  ? INSULIN 29.3 (H) 10/30/2018  ? ?Lab Results  ?Component Value Date  ? TSH 1.610 12/07/2020  ? ?Lab Results  ?Component Value Date  ? CHOL 153 09/14/2021  ? HDL 56 09/14/2021  ? LDLCALC 85 09/14/2021  ? TRIG 57 09/14/2021  ? CHOLHDL 3.0 12/07/2020  ? ?Lab Results  ?Component Value Date  ? VD25OH 37.2 09/14/2021  ? VD25OH 23.7 (L) 12/07/2020  ? VD25OH 29.3 (L) 06/29/2020  ? ?Lab Results  ?Component Value Date  ? WBC 7.5 09/14/2021  ? HGB 14.1 09/14/2021  ? HCT 43.2 09/14/2021  ? MCV 89 09/14/2021  ? PLT 268 09/14/2021  ? ?Lab Results  ?Component Value Date  ? IRON 56 09/14/2021  ? TIBC 301 09/14/2021  ? FERRITIN 94 09/14/2021  ? ?Attestation Statements:  ? ?Reviewed by clinician on day of visit: allergies, medications, problem list, medical history, surgical history, family history, social history, and previous encounter notes. ? ?I, 09/16/2021, CMA, am acting as Insurance claims handler for Energy manager, NP. ? ?I have reviewed the above documentation for accuracy and completeness, and I agree with the above. -  Jamarea Selner d. Aniylah Avans, NP-C ?

## 2021-10-16 ENCOUNTER — Encounter (INDEPENDENT_AMBULATORY_CARE_PROVIDER_SITE_OTHER): Payer: Self-pay | Admitting: Family Medicine

## 2021-10-16 DIAGNOSIS — R7301 Impaired fasting glucose: Secondary | ICD-10-CM

## 2021-10-17 MED ORDER — TIRZEPATIDE 10 MG/0.5ML ~~LOC~~ SOAJ: 10.0000 mg | SUBCUTANEOUS | 0 refills | Status: DC

## 2021-10-18 ENCOUNTER — Encounter (INDEPENDENT_AMBULATORY_CARE_PROVIDER_SITE_OTHER): Payer: Self-pay

## 2021-11-02 ENCOUNTER — Encounter (INDEPENDENT_AMBULATORY_CARE_PROVIDER_SITE_OTHER): Payer: Self-pay | Admitting: Family Medicine

## 2021-11-02 ENCOUNTER — Telehealth (INDEPENDENT_AMBULATORY_CARE_PROVIDER_SITE_OTHER): Payer: BC Managed Care – PPO | Admitting: Family Medicine

## 2021-11-02 DIAGNOSIS — Z6841 Body Mass Index (BMI) 40.0 and over, adult: Secondary | ICD-10-CM

## 2021-11-02 DIAGNOSIS — E669 Obesity, unspecified: Secondary | ICD-10-CM

## 2021-11-02 DIAGNOSIS — F411 Generalized anxiety disorder: Secondary | ICD-10-CM | POA: Diagnosis not present

## 2021-11-02 DIAGNOSIS — R7301 Impaired fasting glucose: Secondary | ICD-10-CM | POA: Diagnosis not present

## 2021-11-02 DIAGNOSIS — F5081 Binge eating disorder: Secondary | ICD-10-CM | POA: Diagnosis not present

## 2021-11-02 MED ORDER — FLUOXETINE HCL 40 MG PO CAPS: 40.0000 mg | ORAL_CAPSULE | Freq: Every day | ORAL | 0 refills | Status: DC

## 2021-11-02 MED ORDER — VYVANSE 40 MG PO CHEW: 40.0000 mg | CHEWABLE_TABLET | Freq: Every day | ORAL | 0 refills | Status: DC

## 2021-11-02 MED ORDER — TIRZEPATIDE 10 MG/0.5ML ~~LOC~~ SOAJ: 10.0000 mg | SUBCUTANEOUS | 0 refills | Status: DC

## 2021-11-02 NOTE — Progress Notes (Signed)
TeleHealth Visit:  Due to the COVID-19 pandemic, this visit was completed with telemedicine (audio/video) technology to reduce patient and provider exposure as well as to preserve personal protective equipment.   Cynthia [name] has verbally consented to this TeleHealth visit. The patient is located at home, the provider is located at home. The participants in this visit include the listed provider and patient. The visit was conducted today via MyChart video.  OBESITY Cynthia [name] is here to discuss her progress with her obesity treatment plan along with follow-up of her obesity related diagnoses.   Today's visit was #: 70 Starting weight: 264 lbs Starting date: 05/01/2017 Today's date: 11/02/2021 Weight at home: 252 lbs (down 3 lbs)  Interim History: Cynthia [name] reports that she has been experiencing an increase in stress and her anxiety.  Nutrition Plan: Keeping a food journal and adhering to recommended goals of 1200 calories and 95 grams of protein daily for 75% of the time.  Anti-obesity medications: Mounjaro 7.5 mg subcutaneously weekly. Reported side effects: None. Activity: Walking for 30 minutes 7 times per week.  Assessment/Plan:   Diagnoses and all orders for this visit:  Binge eating disorder Improving, but not optimized.  The goals for treatment of BED are to reduce eating binges and to achieve healthy eating habits. Because binge eating can correlate with negative emotions, treatment may also address any other mental-health issues, such as depression.  People who binge eat feel as if they don't have control over how much they eat and have feelings of guilt or self-loathing after a binge eating episode.  The FDA has approved Vyvanse as a treatment option for binge eating disorder. Vyvanse targets the brain's reward center by increasing the levels of dopamine and norepinephrine, the chemicals of the brain responsible for feelings of pleasure.  Mindful eating is the recommended nutritional  approach to treating BED.   -     Lisdexamfetamine Dimesylate (VYVANSE) 40 MG CHEW; Chew 40 mg by mouth daily. -     Lisdexamfetamine Dimesylate (VYVANSE) 40 MG CHEW; Chew 40 mg by mouth daily at 12 noon.  GAD (generalized anxiety disorder) Not at goal. Cynthia [name] is interested in increasing Prozac. I recommend that we have her see Dr. Rene Kocher through [facility] to evaluation her medications. Discussed cues and consequences, how thoughts affect eating, model of thoughts, feelings, and behaviors, and strategies for change by focusing on the cue. Discussed cognitive distortions, coping thoughts, and how to change your thoughts.  -     FLUoxetine (PROZAC) 40 MG capsule; Take 1 capsule (40 mg total) by mouth daily.  Impaired fasting glucose Improving, but not optimized. Goal is HgbA1c < 5.7, fasting insulin closer to 5.  Medication: Mounjaro as below  Plan:  She will continue to focus on protein-rich, low simple carbohydrate foods. We reviewed the importance of hydration, regular exercise for stress reduction, and restorative sleep.   Lab Results  Component Value Date   HGBA1C 5.4 09/14/2021   Lab Results  Component Value Date   INSULIN 10.3 12/07/2020   INSULIN 13.7 06/29/2020   INSULIN 12.3 01/06/2020   INSULIN 17.7 06/29/2019   INSULIN 29.3 (H) 10/30/2018    -     tirzepatide (MOUNJARO) 10 MG/0.5ML Pen; Inject 10 mg into the skin once a week.  Obesity, current BMI 46.7 Cynthia [name] is currently in the action stage of change. As such, her goal is to continue with weight loss efforts. She has agreed to keeping a food journal and adhering to recommended goals of  1200 calories and 95 grams of protein daily.   Exercise goals: As is.  Behavioral modification strategies: increasing lean protein intake, no skipping meals, and emotional eating strategies.  Cynthia [name] has agreed to follow-up with our clinic in 4 weeks. She was informed of the importance of frequent follow-up visits to maximize her success with  intensive lifestyle modifications for her multiple health conditions.  Objective:   VITALS: Per patient if applicable, see vitals. GENERAL: Alert and in no acute distress. CARDIOPULMONARY: No increased WOB. Speaking in clear sentences.  PSYCH: Pleasant and cooperative. Speech normal rate and rhythm. Affect is appropriate. Insight and judgement are appropriate. Attention is focused, linear, and appropriate.  NEURO: Oriented as arrived to appointment on time with no prompting.   Lab Results  Component Value Date   CREATININE 0.73 09/14/2021   BUN 12 09/14/2021   NA 140 09/14/2021   K 4.1 09/14/2021   CL 103 09/14/2021   CO2 21 09/14/2021   Lab Results  Component Value Date   ALT 12 09/14/2021   AST 13 09/14/2021   ALKPHOS 83 09/14/2021   BILITOT 0.2 09/14/2021   Lab Results  Component Value Date   HGBA1C 5.4 09/14/2021   HGBA1C 5.5 12/07/2020   HGBA1C 5.3 06/29/2020   HGBA1C 5.4 06/29/2019   HGBA1C 5.4 10/30/2018   Lab Results  Component Value Date   INSULIN 10.3 12/07/2020   INSULIN 13.7 06/29/2020   INSULIN 12.3 01/06/2020   INSULIN 17.7 06/29/2019   INSULIN 29.3 (H) 10/30/2018   Lab Results  Component Value Date   TSH 1.610 12/07/2020   Lab Results  Component Value Date   CHOL 153 09/14/2021   HDL 56 09/14/2021   LDLCALC 85 09/14/2021   TRIG 57 09/14/2021   CHOLHDL 3.0 12/07/2020   Lab Results  Component Value Date   WBC 7.5 09/14/2021   HGB 14.1 09/14/2021   HCT 43.2 09/14/2021   MCV 89 09/14/2021   PLT 268 09/14/2021   Lab Results  Component Value Date   IRON 56 09/14/2021   TIBC 301 09/14/2021   FERRITIN 94 09/14/2021   Attestation Statements:   Reviewed by clinician on day of visit: allergies, medications, problem list, medical history, surgical history, family history, social history, and previous encounter notes.

## 2021-11-03 ENCOUNTER — Ambulatory Visit (INDEPENDENT_AMBULATORY_CARE_PROVIDER_SITE_OTHER): Payer: BC Managed Care – PPO | Admitting: Psychologist

## 2021-11-03 DIAGNOSIS — F411 Generalized anxiety disorder: Secondary | ICD-10-CM | POA: Diagnosis not present

## 2021-11-03 NOTE — Progress Notes (Signed)
[facility] Counselor/Therapist Progress Note ? ?Patient ID: Cynthia [name], MRN: [id number],   ? ?Date: 11/03/2021 ? ?Time Spent: 8:02 am to 8:43 am; total time: 41 minutes ? ? This session was held via video webex teletherapy due to the coronavirus risk at this time. The patient consented to video teletherapy and was located at her home during this session. She is aware it is the responsibility of the patient to secure confidentiality on her end of the session. The provider was in a private home office for the duration of this session. Limits of confidentiality were discussed with the patient.  ? ?Treatment Type: Individual Therapy ? ?Reported Symptoms: Anxiety related to work and pressure from boss ? ?Mental Status Exam: ?Appearance:  Well Groomed     ?Behavior: Appropriate  ?Motor: Normal  ?Speech/Language:  Clear and Coherent  ?Affect: Appropriate  ?Mood: normal  ?Thought process: normal  ?Thought content:   WNL  ?Sensory/Perceptual disturbances:   WNL  ?Orientation: oriented to person, place, and time/date  ?Attention: Good  ?Concentration: Good  ?Memory: WNL  ?Fund of knowledge:  Good  ?Insight:   Fair  ?Judgment:  Fair  ?Impulse Control: Good  ? ?Risk Assessment: ?Danger to Self:  No ?Self-injurious Behavior: No ?Danger to Others: No ?Duty to Warn:no ?Physical Aggression / Violence:No  ?Access to Firearms a concern: No  ?Gang Involvement:No  ? ?Subjective: Beginning the session, the patient indicated that she has been doing okay. She acknowledged that some of the strategies have helped to an extent, however she is not seeing the full improvement she would like. Continuing to talk, she reflected on stress she is experiencing at work. In particular, she processed how her boss has made hurtful remarks towards the patient. Patient processed different ways to respond. She also explored ways to establish a clear break from work when she is at home. She was agreeable to homework and following up. She  denied suicidal and homicidal ideation.   ? ?Interventions:  Worked on developing a therapeutic relationship with the patient using active listening and reflective statements. Provided emotional support using empathy and validation. Reflected on events since the last session. Validated expressed concerns. Processed what coping strategies have assisted the patient. Explored the interpersonal dynamic patient is experiencing with her current boss. Used socratic questions to assist the patient gain insight into self. Challenged some of the thoughts expressed. Explored ways to respond to conflict with boss. Processed ways to establish a break between work and home. Assisted in problem solving. Provided psychoeducation about the research supporting turning off one's phone. Provided psychoeducation about nature's impact on mental health. Provided empathic statements. Assigned homework. Assessed for suicidal and homicidal ideation.  ? ?Homework: Implement behavioral steps to take a break from work when home; consider turning phone off; assess how long can sustain dynamic with boss ? ? ?Next Session: Review homework. Self-care and emotional support.  ? ?Diagnosis: F41.1 generalized anxiety disorder ? ?Plan:  ? ?Goals ?Reduce overall frequency, intensity, and duration of anxiety ?Stabilize anxiety level wile increasing ability to function ?Enhance ability to effectively cope with full variety of stressors ?Learn and implement coping skills that result in a reduction of anxiety  ? ?Objectives target date for all objectives is 08/25/2022 ?Verbalize an understanding of the cognitive, physiological, and behavioral components of anxiety ?Learning and implement calming skills to reduce overall anxiety ?Verbalize an understanding of the role that cognitive biases play in excessive irrational worry and persistent anxiety symptoms ?Identify, challenge, and replace  based fearful talk ?Learn and implement problem solving  strategies ?Identify and engage in pleasant activities ?Learning and implement personal and interpersonal skills to reduce anxiety and improve interpersonal relationships ?Learn to accept limitations in life and commit to tolerating, rather than avoiding, unpleasant emotions while accomplishing meaningful goals ?Identify major life conflicts from the past and present that form the basis for present anxiety ?Maintain involvement in work, family, and social activities ?Reestablish a consistent sleep-wake cycle ?Cooperate with a medical evaluation  ?Interventions ?Engage the patient in behavioral activation ?Use instruction, modeling, and role-playing to build the client's general social, communication, and/or conflict resolution skills ?Use Acceptance and Commitment Therapy to help client accept uncomfortable realities in order to accomplish value-consistent goals ?Reinforce the client's insight into the role of his/her past emotional pain and present anxiety  ?Support the client in following through with work, family, and social activities ?Teach and implement sleep hygiene practices  ?Refer the patient to a physician for a psychotropic medication consultation ?Monito the clint's psychotropic medication compliance ?Discuss how anxiety typically involves excessive worry, various bodily expressions of tension, and avoidance of what is threatening that interact to maintain the problem  ?Teach the patient relaxation skills ?Assign the patient homework ?Discuss examples demonstrating that unrealistic worry overestimates the probability of threats and underestimates patient's ability  ?Assist the patient in analyzing his or her worries ?Help patient understand that avoidance is reinforcing  ? ?The patient and clinician reviewed the treatment plan on 09/18/2021. The patient approved of the treatment plan.  ? ?Hilbert Corrigan, PsyD ? ?

## 2021-11-06 ENCOUNTER — Encounter (INDEPENDENT_AMBULATORY_CARE_PROVIDER_SITE_OTHER): Payer: Self-pay

## 2021-12-05 DIAGNOSIS — R413 Other amnesia: Secondary | ICD-10-CM | POA: Diagnosis not present

## 2021-12-05 DIAGNOSIS — F411 Generalized anxiety disorder: Secondary | ICD-10-CM | POA: Diagnosis not present

## 2021-12-05 DIAGNOSIS — R7309 Other abnormal glucose: Secondary | ICD-10-CM | POA: Diagnosis not present

## 2021-12-05 DIAGNOSIS — F5081 Binge eating disorder: Secondary | ICD-10-CM | POA: Diagnosis not present

## 2021-12-05 DIAGNOSIS — Z9189 Other specified personal risk factors, not elsewhere classified: Secondary | ICD-10-CM | POA: Diagnosis not present

## 2021-12-07 ENCOUNTER — Ambulatory Visit (INDEPENDENT_AMBULATORY_CARE_PROVIDER_SITE_OTHER): Payer: BC Managed Care – PPO | Admitting: Psychologist

## 2021-12-07 DIAGNOSIS — F411 Generalized anxiety disorder: Secondary | ICD-10-CM

## 2021-12-07 NOTE — Progress Notes (Signed)
[facility] Counselor/Therapist Progress Note  Patient ID: Cynthia [name], MRN: [id number],    Date: 12/07/2021  Time Spent: 8:05 am to 8:48 am; total time: 43 minutes   This session was held via video webex teletherapy due to the coronavirus risk at this time. The patient consented to video teletherapy and was located at her home during this session. She is aware it is the responsibility of the patient to secure confidentiality on her end of the session. The provider was in a private home office for the duration of this session. Limits of confidentiality were discussed with the patient.   Treatment Type: Individual Therapy  Reported Symptoms: Anxiety related to work  Mental Status Exam: Appearance:  Well Groomed     Behavior: Appropriate  Motor: Normal  Speech/Language:  Clear and Coherent  Affect: Appropriate  Mood: normal  Thought process: normal  Thought content:   WNL  Sensory/Perceptual disturbances:   WNL  Orientation: oriented to person, place, and time/date  Attention: Good  Concentration: Good  Memory: WNL  Fund of knowledge:  Good  Insight:   Fair  Judgment:  Fair  Impulse Control: Good   Risk Assessment: Danger to Self:  No Self-injurious Behavior: No Danger to Others: No Duty to Warn:no Physical Aggression / Violence:No  Access to Firearms a concern: No  Gang Involvement:No   Subjective: Beginning the session, the patient indicated that she has struggled in the last couple of weeks due to work related stress. Continuing to talk, she acknowledged not being the best at implementing skills previously discussed. She spent time processing better ways to implement skills discussed. Continuing to talk, she reflected on ways to implement breaks into her schedule to practice mindfulness and be present in nature while getting out of the office.  She was agreeable to homework and following up. She denied suicidal and homicidal ideation.    Interventions:   Worked on developing a therapeutic relationship with the patient using active listening and reflective statements. Provided emotional support using empathy and validation. Used summary statements. Normalized and validated expressed thoughts and emotions. Explored whether or not the patient had been implementing previously discussed tools. Assisted in problem solving. Provided psychoeducation about the worry box. Provided psychoeducation about being in nature and its impact on mental health. Challenged some of the thoughts expressed. Used a Air cabin crew to assist the patient gain insight into self. Validated expressed thoughts and emotions. Provided psychoeducation about the Growth Equation. Assisted in problem solving. Processed thoughts and emotions. Provided empathic statements. Assigned homework. Assessed for suicidal and homicidal ideation.   Homework: Implement behavioral steps to take breaks at work; implement tools at bedtime  Next Session: Review homework. Self-care and emotional support.   Diagnosis: F41.1 generalized anxiety disorder  Plan:   Goals Reduce overall frequency, intensity, and duration of anxiety Stabilize anxiety level wile increasing ability to function Enhance ability to effectively cope with full variety of stressors Learn and implement coping skills that result in a reduction of anxiety   Objectives target date for all objectives is 08/25/2022 Verbalize an understanding of the cognitive, physiological, and behavioral components of anxiety Learning and implement calming skills to reduce overall anxiety Verbalize an understanding of the role that cognitive biases play in excessive irrational worry and persistent anxiety symptoms Identify, challenge, and replace based fearful talk Learn and implement problem solving strategies Identify and engage in pleasant activities Learning and implement personal and interpersonal skills to reduce anxiety and improve interpersonal  relationships Learn to  accept limitations in life and commit to tolerating, rather than avoiding, unpleasant emotions while accomplishing meaningful goals Identify major life conflicts from the past and present that form the basis for present anxiety Maintain involvement in work, family, and social activities Reestablish a consistent sleep-wake cycle Cooperate with a medical evaluation  Interventions Engage the patient in behavioral activation Use instruction, modeling, and role-playing to build the client's general social, communication, and/or conflict resolution skills Use Acceptance and Commitment Therapy to help client accept uncomfortable realities in order to accomplish value-consistent goals Reinforce the client's insight into the role of his/her past emotional pain and present anxiety  Support the client in following through with work, family, and social activities Teach and implement sleep hygiene practices  Refer the patient to a physician for a psychotropic medication consultation Monito the clint's psychotropic medication compliance Discuss how anxiety typically involves excessive worry, various bodily expressions of tension, and avoidance of what is threatening that interact to maintain the problem  Teach the patient relaxation skills Assign the patient homework Discuss examples demonstrating that unrealistic worry overestimates the probability of threats and underestimates patient's ability  Assist the patient in analyzing his or her worries Help patient understand that avoidance is reinforcing   The patient and clinician reviewed the treatment plan on 09/18/2021. The patient approved of the treatment plan.   Hilbert Corrigan, PsyD

## 2021-12-15 ENCOUNTER — Ambulatory Visit: Payer: BC Managed Care – PPO | Admitting: Psychologist

## 2022-01-02 NOTE — Telephone Encounter (Signed)
error 

## 2022-01-03 ENCOUNTER — Ambulatory Visit (INDEPENDENT_AMBULATORY_CARE_PROVIDER_SITE_OTHER): Payer: BC Managed Care – PPO | Admitting: Psychologist

## 2022-01-03 DIAGNOSIS — F411 Generalized anxiety disorder: Secondary | ICD-10-CM | POA: Diagnosis not present

## 2022-01-03 NOTE — Progress Notes (Signed)
[facility] Counselor/Therapist Progress Note  Patient ID: Cynthia [name], MRN: [id number],    Date: 01/03/2022  Time Spent: 01:02 pm to 01:45 pm; total time: 43 minutes   This session was held via video webex teletherapy due to the coronavirus risk at this time. The patient consented to video teletherapy and was located at her home during this session. She is aware it is the responsibility of the patient to secure confidentiality on her end of the session. The provider was in a private home office for the duration of this session. Limits of confidentiality were discussed with the patient.   Treatment Type: Individual Therapy  Reported Symptoms: Conflict at work  Mental Status Exam: Appearance:  Well Groomed     Behavior: Appropriate  Motor: Normal  Speech/Language:  Clear and Coherent  Affect: Appropriate  Mood: normal  Thought process: normal  Thought content:   WNL  Sensory/Perceptual disturbances:   WNL  Orientation: oriented to person, place, and time/date  Attention: Good  Concentration: Good  Memory: WNL  Fund of knowledge:  Good  Insight:   Fair  Judgment:  Fair  Impulse Control: Good   Risk Assessment: Danger to Self:  No Self-injurious Behavior: No Danger to Others: No Duty to Warn:no Physical Aggression / Violence:No  Access to Firearms a concern: No  Gang Involvement:No   Subjective: Beginning the session, the patient described herself as doing poorly. Elaborating, she spent the session reflecting on the constant conflict she is experiencing with her boss. She processed different options available to her moving forward regarding whether or not to work towards reconciliation or looking for a new job. She processed thoughts and emotions. She asked to follow up. She denied suicidal and homicidal ideation.    Interventions:  Worked on developing a therapeutic relationship with the patient using active listening and reflective statements. Provided  emotional support using empathy and validation. Reflected on events since the last session. Normalized and validated expressed thoughts and emotions. Explored the factors that are contributing to conflict. Challenged some of the thoughts expressed. Used socratic questions to assist the patient gain insight into self. Used a Air cabin crew to assist the patient. Processed the idea of whether mediation is possible. Assisted the patient in doing a decisional analysis. Facilitated insight into when to approach boss about the idea of a mediation. Identified warning signs to not approach boss. Provided empathic statements. Assigned homework. Assessed for suicidal and homicidal ideation.   Homework: Approach boss about possible mediation.   Next Session: Review homework and emotional support.   Diagnosis: F41.1 generalized anxiety disorder  Plan:   Goals Reduce overall frequency, intensity, and duration of anxiety Stabilize anxiety level wile increasing ability to function Enhance ability to effectively cope with full variety of stressors Learn and implement coping skills that result in a reduction of anxiety   Objectives target date for all objectives is 08/25/2022 Verbalize an understanding of the cognitive, physiological, and behavioral components of anxiety Learning and implement calming skills to reduce overall anxiety Verbalize an understanding of the role that cognitive biases play in excessive irrational worry and persistent anxiety symptoms Identify, challenge, and replace based fearful talk Learn and implement problem solving strategies Identify and engage in pleasant activities Learning and implement personal and interpersonal skills to reduce anxiety and improve interpersonal relationships Learn to accept limitations in life and commit to tolerating, rather than avoiding, unpleasant emotions while accomplishing meaningful goals Identify major life conflicts from the past and present that form  the basis for present anxiety Maintain involvement in work, family, and social activities Reestablish a consistent sleep-wake cycle Cooperate with a medical evaluation  Interventions Engage the patient in behavioral activation Use instruction, modeling, and role-playing to build the client's general social, communication, and/or conflict resolution skills Use Acceptance and Commitment Therapy to help client accept uncomfortable realities in order to accomplish value-consistent goals Reinforce the client's insight into the role of his/her past emotional pain and present anxiety  Support the client in following through with work, family, and social activities Teach and implement sleep hygiene practices  Refer the patient to a physician for a psychotropic medication consultation Monito the clint's psychotropic medication compliance Discuss how anxiety typically involves excessive worry, various bodily expressions of tension, and avoidance of what is threatening that interact to maintain the problem  Teach the patient relaxation skills Assign the patient homework Discuss examples demonstrating that unrealistic worry overestimates the probability of threats and underestimates patient's ability  Assist the patient in analyzing his or her worries Help patient understand that avoidance is reinforcing   The patient and clinician reviewed the treatment plan on 09/18/2021. The patient approved of the treatment plan.   Hilbert Corrigan, PsyD

## 2022-01-09 DIAGNOSIS — Z9189 Other specified personal risk factors, not elsewhere classified: Secondary | ICD-10-CM | POA: Diagnosis not present

## 2022-01-09 DIAGNOSIS — F439 Reaction to severe stress, unspecified: Secondary | ICD-10-CM | POA: Diagnosis not present

## 2022-01-09 DIAGNOSIS — R7301 Impaired fasting glucose: Secondary | ICD-10-CM | POA: Diagnosis not present

## 2022-01-09 DIAGNOSIS — G8929 Other chronic pain: Secondary | ICD-10-CM | POA: Diagnosis not present

## 2022-01-09 DIAGNOSIS — F909 Attention-deficit hyperactivity disorder, unspecified type: Secondary | ICD-10-CM | POA: Diagnosis not present

## 2022-01-29 ENCOUNTER — Ambulatory Visit (INDEPENDENT_AMBULATORY_CARE_PROVIDER_SITE_OTHER): Payer: BC Managed Care – PPO | Admitting: Psychologist

## 2022-01-29 DIAGNOSIS — F411 Generalized anxiety disorder: Secondary | ICD-10-CM | POA: Diagnosis not present

## 2022-01-29 NOTE — Progress Notes (Signed)
[facility] Counselor/Therapist Progress Note  Patient ID: Cynthia [name], MRN: [id number],    Date: 01/29/2022  Time Spent: 08:08 am to 08:46 am; total time: 38 minutes   This session was held via video webex teletherapy due to the coronavirus risk at this time. The patient consented to video teletherapy and was located at her home during this session. She is aware it is the responsibility of the patient to secure confidentiality on her end of the session. The provider was in a private home office for the duration of this session. Limits of confidentiality were discussed with the patient.   Treatment Type: Individual Therapy  Reported Symptoms: Finding self  Mental Status Exam: Appearance:  Well Groomed     Behavior: Appropriate  Motor: Normal  Speech/Language:  Clear and Coherent  Affect: Appropriate  Mood: normal  Thought process: normal  Thought content:   WNL  Sensory/Perceptual disturbances:   WNL  Orientation: oriented to person, place, and time/date  Attention: Good  Concentration: Good  Memory: WNL  Fund of knowledge:  Good  Insight:   Fair  Judgment:  Fair  Impulse Control: Good   Risk Assessment: Danger to Self:  No Self-injurious Behavior: No Danger to Others: No Duty to Warn:no Physical Aggression / Violence:No  Access to Firearms a concern: No  Gang Involvement:No   Subjective: Beginning the session, the patient described herself as doing better indicating that she and her boss are having more open communication with one another, which has helped. She reflected on how she is beginning to stand up on  herself. Continuing to talk, she described herself as entering a phase of redefining/rediscovering who she is and what she wants to do. Several themes were identified with this theme. She talked about needing to socialize more with others outside her family of origin and explored how to accomplish this. Talking, she identified art and in particular  painting as important to her. She spent time exploring how to explore this community to see if she can get her needs met. She processed thoughts and feelings. She asked to follow up. She denied suicidal and homicidal ideation.    Interventions:  Worked on developing a therapeutic relationship with the patient using active listening and reflective statements. Provided emotional support using empathy and validation. Reflected on events since the last session. Used summary and reflective statements. Praised the patient for doing well and processed how the meeting went with her boss. Normalized and validated expressed thoughts and emotions. Processed how patient felt about standing up for self. Used metaphors to assist the patient. Explored what this phase of self-discovery looks like for the patient. Used socratic questions to assist the patient. Identified several different themes and processed thoughts with those themes. Explored how patient could explore getting socialization needs met with painting. Assisted in problem solving. Used MI to roll with the resistance. Provided psychoeducation about values and introduced the idea of exploring values. Provided empathic statements. Assigned homework. Assessed for suicidal and homicidal ideation.   Homework: Complete handout on values  Next Session: Review homework and emotional support.   Diagnosis: F41.1 generalized anxiety disorder  Plan:   Goals Reduce overall frequency, intensity, and duration of anxiety Stabilize anxiety level wile increasing ability to function Enhance ability to effectively cope with full variety of stressors Learn and implement coping skills that result in a reduction of anxiety   Objectives target date for all objectives is 08/25/2022 Verbalize an understanding of the cognitive, physiological, and behavioral  components of anxiety Learning and implement calming skills to reduce overall anxiety Verbalize an understanding of the  role that cognitive biases play in excessive irrational worry and persistent anxiety symptoms Identify, challenge, and replace based fearful talk Learn and implement problem solving strategies Identify and engage in pleasant activities Learning and implement personal and interpersonal skills to reduce anxiety and improve interpersonal relationships Learn to accept limitations in life and commit to tolerating, rather than avoiding, unpleasant emotions while accomplishing meaningful goals Identify major life conflicts from the past and present that form the basis for present anxiety Maintain involvement in work, family, and social activities Reestablish a consistent sleep-wake cycle Cooperate with a medical evaluation  Interventions Engage the patient in behavioral activation Use instruction, modeling, and role-playing to build the client's general social, communication, and/or conflict resolution skills Use Acceptance and Commitment Therapy to help client accept uncomfortable realities in order to accomplish value-consistent goals Reinforce the client's insight into the role of his/her past emotional pain and present anxiety  Support the client in following through with work, family, and social activities Teach and implement sleep hygiene practices  Refer the patient to a physician for a psychotropic medication consultation Monito the clint's psychotropic medication compliance Discuss how anxiety typically involves excessive worry, various bodily expressions of tension, and avoidance of what is threatening that interact to maintain the problem  Teach the patient relaxation skills Assign the patient homework Discuss examples demonstrating that unrealistic worry overestimates the probability of threats and underestimates patient's ability  Assist the patient in analyzing his or her worries Help patient understand that avoidance is reinforcing   The patient and clinician reviewed the treatment  plan on 09/18/2021. The patient approved of the treatment plan.   Conception Chancy, PsyD

## 2022-02-21 DIAGNOSIS — E65 Localized adiposity: Secondary | ICD-10-CM | POA: Diagnosis not present

## 2022-02-21 DIAGNOSIS — F411 Generalized anxiety disorder: Secondary | ICD-10-CM | POA: Diagnosis not present

## 2022-02-21 DIAGNOSIS — F900 Attention-deficit hyperactivity disorder, predominantly inattentive type: Secondary | ICD-10-CM | POA: Diagnosis not present

## 2022-02-23 ENCOUNTER — Ambulatory Visit: Payer: Self-pay | Admitting: Psychologist

## 2022-02-26 ENCOUNTER — Ambulatory Visit (INDEPENDENT_AMBULATORY_CARE_PROVIDER_SITE_OTHER): Payer: Self-pay | Admitting: Psychologist

## 2022-02-26 DIAGNOSIS — F411 Generalized anxiety disorder: Secondary | ICD-10-CM

## 2022-02-26 NOTE — Progress Notes (Signed)
[facility] Counselor/Therapist Progress Note  Patient ID: Cynthia [name], MRN: [id number],    Date: 02/26/2022  Time Spent: 08:03 am to 08:43 am; total time: 40 minutes   This session was held via video webex teletherapy due to the coronavirus risk at this time. The patient consented to video teletherapy and was located at her home during this session. She is aware it is the responsibility of the patient to secure confidentiality on her end of the session. The provider was in a private home office for the duration of this session. Limits of confidentiality were discussed with the patient.   Treatment Type: Individual Therapy  Reported Symptoms: Low self-esteem  Mental Status Exam: Appearance:  Well Groomed     Behavior: Appropriate  Motor: Normal  Speech/Language:  Clear and Coherent  Affect: Appropriate  Mood: normal  Thought process: normal  Thought content:   WNL  Sensory/Perceptual disturbances:   WNL  Orientation: oriented to person, place, and time/date  Attention: Good  Concentration: Good  Memory: WNL  Fund of knowledge:  Good  Insight:   Fair  Judgment:  Fair  Impulse Control: Good   Risk Assessment: Danger to Self:  No Self-injurious Behavior: No Danger to Others: No Duty to Warn:no Physical Aggression / Violence:No  Access to Firearms a concern: No  Gang Involvement:No   Subjective: Beginning the session, the patient described herself as fair following the decision to leave her job. She reflected on the decision making process to leave her job and what it means for her moving forward in life. She processed thoughts and emotions. She asked to follow up. She denied suicidal and homicidal ideation.    Interventions:  Worked on developing a therapeutic relationship with the patient using active listening and reflective statements. Provided emotional support using empathy and validation. Reflected on events since the last session. Processed the  decision to leave her hob. Normalized and validated expressed thoughts and emotions. Explored what steps patient needs to take to build up self-esteem. Used socratic questions to assist the patient. Reflected on whether or not there is any chance of reconciliation. Explored what this meant for next steps of counseling. Provided empathic statements. Assigned homework. Assessed for suicidal and homicidal ideation.   Homework: Reflect on what she currently needs from counseling moving forward  Next Session: Review homework and emotional support.   Diagnosis: F41.1 generalized anxiety disorder  Plan:   Goals Reduce overall frequency, intensity, and duration of anxiety Stabilize anxiety level wile increasing ability to function Enhance ability to effectively cope with full variety of stressors Learn and implement coping skills that result in a reduction of anxiety   Objectives target date for all objectives is 08/25/2022 Verbalize an understanding of the cognitive, physiological, and behavioral components of anxiety Learning and implement calming skills to reduce overall anxiety Verbalize an understanding of the role that cognitive biases play in excessive irrational worry and persistent anxiety symptoms Identify, challenge, and replace based fearful talk Learn and implement problem solving strategies Identify and engage in pleasant activities Learning and implement personal and interpersonal skills to reduce anxiety and improve interpersonal relationships Learn to accept limitations in life and commit to tolerating, rather than avoiding, unpleasant emotions while accomplishing meaningful goals Identify major life conflicts from the past and present that form the basis for present anxiety Maintain involvement in work, family, and social activities Reestablish a consistent sleep-wake cycle Cooperate with a medical evaluation  Interventions Engage the patient in behavioral activation Use  instruction, modeling, and role-playing to build the client's general social, communication, and/or conflict resolution skills Use Acceptance and Commitment Therapy to help client accept uncomfortable realities in order to accomplish value-consistent goals Reinforce the client's insight into the role of his/her past emotional pain and present anxiety  Support the client in following through with work, family, and social activities Teach and implement sleep hygiene practices  Refer the patient to a physician for a psychotropic medication consultation Monito the clint's psychotropic medication compliance Discuss how anxiety typically involves excessive worry, various bodily expressions of tension, and avoidance of what is threatening that interact to maintain the problem  Teach the patient relaxation skills Assign the patient homework Discuss examples demonstrating that unrealistic worry overestimates the probability of threats and underestimates patient's ability  Assist the patient in analyzing his or her worries Help patient understand that avoidance is reinforcing   The patient and clinician reviewed the treatment plan on 09/18/2021. The patient approved of the treatment plan.   Hilbert Corrigan, PsyD

## 2022-02-28 ENCOUNTER — Encounter (INDEPENDENT_AMBULATORY_CARE_PROVIDER_SITE_OTHER): Payer: Self-pay

## 2022-03-21 DIAGNOSIS — E559 Vitamin D deficiency, unspecified: Secondary | ICD-10-CM | POA: Diagnosis not present

## 2022-03-21 DIAGNOSIS — F900 Attention-deficit hyperactivity disorder, predominantly inattentive type: Secondary | ICD-10-CM | POA: Diagnosis not present

## 2022-03-21 DIAGNOSIS — R202 Paresthesia of skin: Secondary | ICD-10-CM | POA: Diagnosis not present

## 2022-03-21 DIAGNOSIS — M2550 Pain in unspecified joint: Secondary | ICD-10-CM | POA: Diagnosis not present

## 2022-03-21 DIAGNOSIS — E8881 Metabolic syndrome: Secondary | ICD-10-CM | POA: Diagnosis not present

## 2022-04-03 ENCOUNTER — Ambulatory Visit (INDEPENDENT_AMBULATORY_CARE_PROVIDER_SITE_OTHER): Payer: BC Managed Care – PPO | Admitting: Psychologist

## 2022-04-03 DIAGNOSIS — F411 Generalized anxiety disorder: Secondary | ICD-10-CM

## 2022-04-03 NOTE — Progress Notes (Signed)
[facility] Counselor/Therapist Progress Note  Patient ID: Cynthia [name], MRN: [id number],    Date: 04/03/2022  Time Spent: 08:02 am to 08:41 am; total time: 39 minutes   This session was held via video webex teletherapy due to the coronavirus risk at this time. The patient consented to video teletherapy and was located at her home during this session. She is aware it is the responsibility of the patient to secure confidentiality on her end of the session. The provider was in a private home office for the duration of this session. Limits of confidentiality were discussed with the patient.   Treatment Type: Individual Therapy  Reported Symptoms: Low self-esteem  Mental Status Exam: Appearance:  Well Groomed     Behavior: Appropriate  Motor: Normal  Speech/Language:  Clear and Coherent  Affect: Appropriate  Mood: normal  Thought process: normal  Thought content:   WNL  Sensory/Perceptual disturbances:   WNL  Orientation: oriented to person, place, and time/date  Attention: Good  Concentration: Good  Memory: WNL  Fund of knowledge:  Good  Insight:   Fair  Judgment:  Fair  Impulse Control: Good   Risk Assessment: Danger to Self:  No Self-injurious Behavior: No Danger to Others: No Duty to Warn:no Physical Aggression / Violence:No  Access to Firearms a concern: No  Gang Involvement:No   Subjective: Beginning the session, the patient described herself as okay. Patient spent the session reflecting and processing family dynamics. She explored the need to be present for her family and to put their needs before herself. She asked to follow up. She denied suicidal and homicidal ideation.    Interventions:  Worked on developing a therapeutic relationship with the patient using active listening and reflective statements. Provided emotional support using empathy and validation. Reflected on events since the last session. Praised the patient for doing okay. Used socratic  questions to assist the patient gain insight into self. Challenged some of the thoughts expressed by the patient. Explored family dynamics. Processed thoughts and emotions. Explored the idea of putting her needs before others. Provided empathic statements. Assigned homework. Assessed for suicidal and homicidal ideation.   Homework: Reflect on putting her needs before others.   Next Session: Review homework and emotional support.   Diagnosis: F41.1 generalized anxiety disorder  Plan:   Goals Reduce overall frequency, intensity, and duration of anxiety Stabilize anxiety level wile increasing ability to function Enhance ability to effectively cope with full variety of stressors Learn and implement coping skills that result in a reduction of anxiety   Objectives target date for all objectives is 08/25/2022 Verbalize an understanding of the cognitive, physiological, and behavioral components of anxiety Learning and implement calming skills to reduce overall anxiety Verbalize an understanding of the role that cognitive biases play in excessive irrational worry and persistent anxiety symptoms Identify, challenge, and replace based fearful talk Learn and implement problem solving strategies Identify and engage in pleasant activities Learning and implement personal and interpersonal skills to reduce anxiety and improve interpersonal relationships Learn to accept limitations in life and commit to tolerating, rather than avoiding, unpleasant emotions while accomplishing meaningful goals Identify major life conflicts from the past and present that form the basis for present anxiety Maintain involvement in work, family, and social activities Reestablish a consistent sleep-wake cycle Cooperate with a medical evaluation  Interventions Engage the patient in behavioral activation Use instruction, modeling, and role-playing to build the client's general social, communication, and/or conflict resolution  skills Use Acceptance and Commitment  Therapy to help client accept uncomfortable realities in order to accomplish value-consistent goals Reinforce the client's insight into the role of his/her past emotional pain and present anxiety  Support the client in following through with work, family, and social activities Teach and implement sleep hygiene practices  Refer the patient to a physician for a psychotropic medication consultation Monito the clint's psychotropic medication compliance Discuss how anxiety typically involves excessive worry, various bodily expressions of tension, and avoidance of what is threatening that interact to maintain the problem  Teach the patient relaxation skills Assign the patient homework Discuss examples demonstrating that unrealistic worry overestimates the probability of threats and underestimates patient's ability  Assist the patient in analyzing his or her worries Help patient understand that avoidance is reinforcing   The patient and clinician reviewed the treatment plan on 09/18/2021. The patient approved of the treatment plan.   Hilbert Corrigan, PsyD

## 2022-04-09 ENCOUNTER — Ambulatory Visit (INDEPENDENT_AMBULATORY_CARE_PROVIDER_SITE_OTHER): Payer: BC Managed Care – PPO | Admitting: Psychologist

## 2022-04-09 DIAGNOSIS — F411 Generalized anxiety disorder: Secondary | ICD-10-CM

## 2022-04-09 NOTE — Progress Notes (Signed)
[facility] Counselor/Therapist Progress Note  Patient ID: Cynthia [name], MRN: [id number],    Date: 04/09/2022  Time Spent: 03:05 pm to 03:43 pm; total time: 38 minutes   This session was held via video webex teletherapy due to the coronavirus risk at this time. The patient consented to video teletherapy and was located at her home during this session. She is aware it is the responsibility of the patient to secure confidentiality on her end of the session. The provider was in a private home office for the duration of this session. Limits of confidentiality were discussed with the patient.   Treatment Type: Individual Therapy  Reported Symptoms: Low self-esteem and lacking direction for therapy  Mental Status Exam: Appearance:  Well Groomed     Behavior: Appropriate  Motor: Normal  Speech/Language:  Clear and Coherent  Affect: Appropriate  Mood: normal  Thought process: normal  Thought content:   WNL  Sensory/Perceptual disturbances:   WNL  Orientation: oriented to person, place, and time/date  Attention: Good  Concentration: Good  Memory: WNL  Fund of knowledge:  Good  Insight:   Fair  Judgment:  Fair  Impulse Control: Good   Risk Assessment: Danger to Self:  No Self-injurious Behavior: No Danger to Others: No Duty to Warn:no Physical Aggression / Violence:No  Access to Firearms a concern: No  Gang Involvement:No   Subjective: Beginning the session, the patient described herself as okay and indicated that she is working on filling out job applications. From there, she indicated that she had not completed her homework assignment regarding goals for therapy. Patient spent part of the session, reflecting on whether or not she needs to socialize more, needs accountability in her life, to focus on self, and difficulty with vulnerability. Despite several different directions, patient voiced not being sure where to go in therapy. She was agreeable with the plan  discussed. She denied suicidal and homicidal ideation.    Interventions:  Worked on developing a therapeutic relationship with the patient using active listening and reflective statements. Provided emotional support using empathy and validation. Used summary and reflective statements. Praised patient for completing job applications. Reviewed events since the last session. Attempted to explore different goals moving forward for therapy. Identified several different themes. Used MI to roll with the resistance. Used socratic questions to assist the patient gain insight into self. Challenged some of the thoughts expressed by the patient. Pointed out discrepancies expressed. Discussed with patient that until she has clarity in where she wants to go for therapy she would not be able to schedule a session with this therapist. Processed thoughts and emotions. Provided empathic statements. Assessed for suicidal and homicidal ideation.   Homework: Identify goals for therapy  Next Session: NA  Diagnosis: F41.1 generalized anxiety disorder  Plan:   Goals Reduce overall frequency, intensity, and duration of anxiety Stabilize anxiety level wile increasing ability to function Enhance ability to effectively cope with full variety of stressors Learn and implement coping skills that result in a reduction of anxiety   Objectives target date for all objectives is 08/25/2022 Verbalize an understanding of the cognitive, physiological, and behavioral components of anxiety Learning and implement calming skills to reduce overall anxiety Verbalize an understanding of the role that cognitive biases play in excessive irrational worry and persistent anxiety symptoms Identify, challenge, and replace based fearful talk Learn and implement problem solving strategies Identify and engage in pleasant activities Learning and implement personal and interpersonal skills to reduce anxiety and improve  interpersonal  relationships Learn to accept limitations in life and commit to tolerating, rather than avoiding, unpleasant emotions while accomplishing meaningful goals Identify major life conflicts from the past and present that form the basis for present anxiety Maintain involvement in work, family, and social activities Reestablish a consistent sleep-wake cycle Cooperate with a medical evaluation  Interventions Engage the patient in behavioral activation Use instruction, modeling, and role-playing to build the client's general social, communication, and/or conflict resolution skills Use Acceptance and Commitment Therapy to help client accept uncomfortable realities in order to accomplish value-consistent goals Reinforce the client's insight into the role of his/her past emotional pain and present anxiety  Support the client in following through with work, family, and social activities Teach and implement sleep hygiene practices  Refer the patient to a physician for a psychotropic medication consultation Monito the clint's psychotropic medication compliance Discuss how anxiety typically involves excessive worry, various bodily expressions of tension, and avoidance of what is threatening that interact to maintain the problem  Teach the patient relaxation skills Assign the patient homework Discuss examples demonstrating that unrealistic worry overestimates the probability of threats and underestimates patient's ability  Assist the patient in analyzing his or her worries Help patient understand that avoidance is reinforcing   The patient and clinician reviewed the treatment plan on 09/18/2021. The patient approved of the treatment plan.   Conception Chancy, PsyD

## 2022-04-23 DIAGNOSIS — E559 Vitamin D deficiency, unspecified: Secondary | ICD-10-CM | POA: Diagnosis not present

## 2022-04-23 DIAGNOSIS — E88819 Insulin resistance, unspecified: Secondary | ICD-10-CM | POA: Diagnosis not present

## 2022-04-23 DIAGNOSIS — M25561 Pain in right knee: Secondary | ICD-10-CM | POA: Diagnosis not present

## 2022-04-23 DIAGNOSIS — E538 Deficiency of other specified B group vitamins: Secondary | ICD-10-CM | POA: Diagnosis not present

## 2022-05-28 DIAGNOSIS — E538 Deficiency of other specified B group vitamins: Secondary | ICD-10-CM | POA: Diagnosis not present

## 2022-05-28 DIAGNOSIS — R5383 Other fatigue: Secondary | ICD-10-CM | POA: Diagnosis not present

## 2022-05-28 DIAGNOSIS — Z6841 Body Mass Index (BMI) 40.0 and over, adult: Secondary | ICD-10-CM | POA: Diagnosis not present

## 2022-05-28 DIAGNOSIS — D519 Vitamin B12 deficiency anemia, unspecified: Secondary | ICD-10-CM | POA: Diagnosis not present

## 2022-05-30 DIAGNOSIS — E88819 Insulin resistance, unspecified: Secondary | ICD-10-CM | POA: Diagnosis not present

## 2022-05-30 DIAGNOSIS — F900 Attention-deficit hyperactivity disorder, predominantly inattentive type: Secondary | ICD-10-CM | POA: Diagnosis not present

## 2022-05-30 DIAGNOSIS — E559 Vitamin D deficiency, unspecified: Secondary | ICD-10-CM | POA: Diagnosis not present

## 2022-05-30 DIAGNOSIS — E538 Deficiency of other specified B group vitamins: Secondary | ICD-10-CM | POA: Diagnosis not present

## 2022-06-28 DIAGNOSIS — E88819 Insulin resistance, unspecified: Secondary | ICD-10-CM | POA: Diagnosis not present

## 2022-06-28 DIAGNOSIS — E538 Deficiency of other specified B group vitamins: Secondary | ICD-10-CM | POA: Diagnosis not present

## 2022-06-28 DIAGNOSIS — E559 Vitamin D deficiency, unspecified: Secondary | ICD-10-CM | POA: Diagnosis not present

## 2022-06-28 DIAGNOSIS — M25561 Pain in right knee: Secondary | ICD-10-CM | POA: Diagnosis not present

## 2022-07-10 DIAGNOSIS — Z6841 Body Mass Index (BMI) 40.0 and over, adult: Secondary | ICD-10-CM | POA: Diagnosis not present

## 2022-07-10 DIAGNOSIS — R1011 Right upper quadrant pain: Secondary | ICD-10-CM | POA: Diagnosis not present

## 2022-07-10 DIAGNOSIS — R7401 Elevation of levels of liver transaminase levels: Secondary | ICD-10-CM | POA: Diagnosis not present

## 2022-07-11 ENCOUNTER — Other Ambulatory Visit: Payer: Self-pay | Admitting: Family Medicine

## 2022-07-11 DIAGNOSIS — R7401 Elevation of levels of liver transaminase levels: Secondary | ICD-10-CM

## 2022-07-11 DIAGNOSIS — R1011 Right upper quadrant pain: Secondary | ICD-10-CM

## 2022-07-20 ENCOUNTER — Ambulatory Visit
Admission: RE | Admit: 2022-07-20 | Discharge: 2022-07-20 | Disposition: A | Discharge status: Home / Self Care | Payer: BC Managed Care – PPO | Source: Ambulatory Visit | Attending: Family Medicine | Admitting: Family Medicine

## 2022-07-20 DIAGNOSIS — R1011 Right upper quadrant pain: Secondary | ICD-10-CM

## 2022-07-20 DIAGNOSIS — R7401 Elevation of levels of liver transaminase levels: Secondary | ICD-10-CM

## 2022-07-20 DIAGNOSIS — Z9049 Acquired absence of other specified parts of digestive tract: Secondary | ICD-10-CM | POA: Diagnosis not present

## 2022-07-27 DIAGNOSIS — G4700 Insomnia, unspecified: Secondary | ICD-10-CM | POA: Diagnosis not present

## 2022-07-27 DIAGNOSIS — J4530 Mild persistent asthma, uncomplicated: Secondary | ICD-10-CM | POA: Diagnosis not present

## 2022-07-27 DIAGNOSIS — E559 Vitamin D deficiency, unspecified: Secondary | ICD-10-CM | POA: Diagnosis not present

## 2022-08-14 DIAGNOSIS — F908 Attention-deficit hyperactivity disorder, other type: Secondary | ICD-10-CM | POA: Diagnosis not present

## 2022-08-14 DIAGNOSIS — K760 Fatty (change of) liver, not elsewhere classified: Secondary | ICD-10-CM | POA: Diagnosis not present

## 2022-08-14 DIAGNOSIS — D518 Other vitamin B12 deficiency anemias: Secondary | ICD-10-CM | POA: Diagnosis not present

## 2022-08-14 DIAGNOSIS — R140 Abdominal distension (gaseous): Secondary | ICD-10-CM | POA: Diagnosis not present

## 2022-09-04 DIAGNOSIS — R790 Abnormal level of blood mineral: Secondary | ICD-10-CM | POA: Diagnosis not present

## 2022-09-04 DIAGNOSIS — E559 Vitamin D deficiency, unspecified: Secondary | ICD-10-CM | POA: Diagnosis not present

## 2022-09-04 DIAGNOSIS — M546 Pain in thoracic spine: Secondary | ICD-10-CM | POA: Diagnosis not present

## 2022-09-04 DIAGNOSIS — G8929 Other chronic pain: Secondary | ICD-10-CM | POA: Diagnosis not present

## 2022-09-04 DIAGNOSIS — E538 Deficiency of other specified B group vitamins: Secondary | ICD-10-CM | POA: Diagnosis not present

## 2022-09-04 DIAGNOSIS — E1169 Type 2 diabetes mellitus with other specified complication: Secondary | ICD-10-CM | POA: Diagnosis not present

## 2022-09-13 ENCOUNTER — Other Ambulatory Visit: Payer: Self-pay

## 2022-09-13 ENCOUNTER — Encounter: Payer: Self-pay | Admitting: Family Medicine

## 2022-09-13 ENCOUNTER — Ambulatory Visit: Payer: BC Managed Care – PPO | Admitting: Family Medicine

## 2022-09-13 VITALS — BP 100/89 | Temp 106.0°F | Resp 12 | Ht 62.0 in | Wt 240.8 lb

## 2022-09-13 DIAGNOSIS — J302 Other seasonal allergic rhinitis: Secondary | ICD-10-CM

## 2022-09-13 DIAGNOSIS — J0100 Acute maxillary sinusitis, unspecified: Secondary | ICD-10-CM | POA: Insufficient documentation

## 2022-09-13 DIAGNOSIS — J3089 Other allergic rhinitis: Secondary | ICD-10-CM

## 2022-09-13 DIAGNOSIS — J4540 Moderate persistent asthma, uncomplicated: Secondary | ICD-10-CM | POA: Diagnosis not present

## 2022-09-13 MED ORDER — PREDNISONE 10 MG PO TABS: ORAL_TABLET | ORAL | 0 refills | Status: DC

## 2022-09-13 MED ORDER — DOXYCYCLINE MONOHYDRATE 100 MG PO CAPS: 100.0000 mg | ORAL_CAPSULE | Freq: Two times a day (BID) | ORAL | 0 refills | Status: AC

## 2022-09-13 NOTE — Patient Instructions (Addendum)
Acute sinusitis Begin prednisone 10 mg tablets. Take 2 tablets twice a day for 3 days, then take 2 tablets once a day for 1 day, then take 1 tablet on the 5th day, then stop  If no improvement in your symptoms by Sunday, begin doxycycline 100 mg twice a day for 10 days Restart Qnasal 2 sprays in each nostril once a day. Consider saline nasal rinses as needed for nasal symptoms. Use this before any medicated nasal sprays for best result Continue Mucinex [phone] mg twice a day and increase fluid intake to thin out mucus  Asthma Continue montelukast 10 mg once a day to prevent cough or wheeze Continue albuterol 2 puffs once every 4 hours as needed for cough or wheeze Continue albuterol 2 puffs 5 to 15 minutes before activity to decrease cough or wheeze  Allergic rhinitis Continue allergen avoidance measures directed toward grass pollen, weed pollen, ragweed pollen, tree pollen, and mold as listed below Continue Xyzal 5 mg once a day as needed for runny nose or itch Restart Qnasl 2 sprays in each nostril once a day as needed for a stuffy nose For nasal congestion, continue Mucinex 1200 mg twice a day and increase fluid intake to thin mucus Begin saline nasal rinses as needed for nasal symptoms. Use this before any medicated nasal sprays for best result  Call the clinic if this treatment plan is not working well for you.  Follow up in 2 months or sooner if needed.  Reducing Pollen Exposure The American [employer] suggests the following steps to reduce your exposure to pollen during allergy seasons. Do not hang sheets or clothing out to dry; pollen may collect on these items. Do not mow lawns or spend time around freshly cut grass; mowing stirs up pollen. Keep windows closed at night.  Keep car windows closed while driving. Minimize morning activities outdoors, a time when pollen counts are usually at their highest. Stay indoors as much as possible when pollen  counts or humidity is high and on windy days when pollen tends to remain in the air longer. Use air conditioning when possible.  Many air conditioners have filters that trap the pollen spores. Use a HEPA room air filter to remove pollen form the indoor air you breathe.  Control of Mold Allergen Mold and fungi can grow on a variety of surfaces provided certain temperature and moisture conditions exist.  Outdoor molds grow on plants, decaying vegetation and soil.  The major outdoor mold, Alternaria and Cladosporium, are found in very high numbers during hot and dry conditions.  Generally, a late Summer - Fall peak is seen for common outdoor fungal spores.  Rain will temporarily lower outdoor mold spore count, but counts rise rapidly when the rainy period ends.  The most important indoor molds are Aspergillus and Penicillium.  Dark, humid and poorly ventilated basements are ideal sites for mold growth.  The next most common sites of mold growth are the bathroom and the kitchen.  Outdoor [employer] Use air conditioning and keep windows closed Avoid exposure to decaying vegetation. Avoid leaf raking. Avoid grain handling. Consider wearing a face mask if working in moldy areas.  Indoor Mold Control Maintain humidity below 50%. Clean washable surfaces with 5% bleach solution. Remove sources e.g. Contaminated carpets.

## 2022-09-13 NOTE — Progress Notes (Signed)
Cynthia [name] [id number] Dept: [phone]  FOLLOW UP NOTE  Patient ID: Cynthia [name], female    DOB: [birth year]  Age: 37 y.o. MRN: [id number] Date of Office Visit: 09/13/2022  Assessment  Chief Complaint: Nasal Congestion and Cough (Pt c/o itchy water eyes congestion in chest ears feel stuffy x 4 days feeling fatigue.)  HPI Cynthia [name] is a 37 year old female who presents to the clinic for acute evaluation of nasal congestion and cough.  She was last seen in this clinic on 05/26/2021 by Gareth Morgan, FNP, for evaluation of asthma and allergic rhinitis.  At today's visit, she reports that she began to experience symptoms including nasal congestion, clear rhinorrhea, sneezing, headache, sinus pressure, and postnasal drainage that began about Sunday.  She denies fever, sweats, chills, or sick contacts.  She reports that previous to Sunday she had been feeling well.  She continues montelukast 10 mg once a day, Xyzal as needed, and saline nasal rinses as needed.  She is not currently using a nasal steroid spray.  Her last environmental allergy testing was on 05/06/2018 and was positive to grass pollen, weed pollen, ragweed pollen, tree pollen, and mold.  Asthma is reported as moderately well-controlled with occasional shortness of breath and chest tightness that began today as well as dry cough that began today.  She denies wheeze with activity or rest.  She has not used her albuterol since her last visit to this clinic and continues montelukast 10 mg once a day.  She does report that her primary care provider has been refilling her montelukast prescription.  She reports that she has previously had pneumonia in 2013 as well as 2016 and has previously had several sinus infections each year.  She reports a significant decrease in infections over the last several years, however, reports a propensity toward sinus infection requiring antibiotic in previous years.  Her current medications  are listed in the chart.     Drug Allergies:  Allergies  Allergen Reactions   Aquacel [Carboxymethylcellulose] Itching, Rash and Other (See Comments)    Caused blisters   Penicillins     Unknown reaction Has patient had a PCN reaction causing immediate rash, facial/tongue/throat swelling, SOB or lightheadedness with hypotension: Unknown Has patient had a PCN reaction causing severe rash involving mucus membranes or skin necrosis: Unknown Has patient had a PCN reaction that required hospitalization: No Has patient had a PCN reaction occurring within the last 10 years: No If all of the above answers are "NO", then may proceed with Cephalosporin use.  Other reaction(s): unknown as a baby   Adhesive [Tape] Rash    Prefers to use paper tape   Latex Rash   Oxycodone Itching and Rash    Physical Exam: BP 100/89   Temp (!) 106 F (41.1 C)   Resp 12   Ht 5' 2"$  (1.575 m)   Wt 240 lb 12.8 oz (109.2 kg)   SpO2 98%   BMI 44.04 kg/m    Physical Exam Vitals reviewed.  Constitutional:      Appearance: Normal appearance.  HENT:     Head: Normocephalic and atraumatic.     Right Ear: Tympanic membrane normal.     Left Ear: Tympanic membrane normal.     Mouth/Throat:     Pharynx: Oropharynx is clear.  Eyes:     Conjunctiva/sclera: Conjunctivae normal.  Cardiovascular:     Rate and Rhythm: Normal rate and regular rhythm.     Heart  sounds: Normal heart sounds. No murmur heard. Pulmonary:     Effort: Pulmonary effort is normal.     Breath sounds: Normal breath sounds.     Comments: Lungs clear to auscultation Musculoskeletal:        General: Normal range of motion.     Cervical back: Normal range of motion and neck supple.  Skin:    General: Skin is warm and dry.  Neurological:     Mental Status: She is alert and oriented to person, place, and time.  Psychiatric:        Mood and Affect: Mood normal.        Behavior: Behavior normal.        Thought Content: Thought content  normal.        Judgment: Judgment normal.     Diagnostics: FVC 3.16, FEV1 2.53.  Predicted FVC 3.47, predicted FEV1 2.89.  Spirometry indicates normal ventilatory function.  Assessment and Plan: 1. Moderate persistent asthma, uncomplicated   2. Seasonal and perennial allergic rhinitis   3. Acute non-recurrent maxillary sinusitis     Meds ordered this encounter  Medications   predniSONE (DELTASONE) 10 MG tablet    Sig: Begin prednisone 10 mg tablets. Take 2 tablets twice a day for 3 days, then take 2 tablets once a day for 1 day, then take 1 tablet on the 5th day, then stop    Dispense:  15 tablet    Refill:  0   doxycycline (MONODOX) 100 MG capsule    Sig: Take 1 capsule (100 mg total) by mouth 2 (two) times daily for 10 days.    Dispense:  20 capsule    Refill:  0    Patient Instructions  Acute sinusitis Begin prednisone 10 mg tablets. Take 2 tablets twice a day for 3 days, then take 2 tablets once a day for 1 day, then take 1 tablet on the 5th day, then stop  If no improvement in your symptoms by Sunday, begin doxycycline 100 mg twice a day for 10 days Restart Qnasal 2 sprays in each nostril once a day. Consider saline nasal rinses as needed for nasal symptoms. Use this before any medicated nasal sprays for best result Continue Mucinex [phone] mg twice a day and increase fluid intake to thin out mucus  Asthma Continue montelukast 10 mg once a day to prevent cough or wheeze Continue albuterol 2 puffs once every 4 hours as needed for cough or wheeze Continue albuterol 2 puffs 5 to 15 minutes before activity to decrease cough or wheeze  Allergic rhinitis Continue allergen avoidance measures directed toward grass pollen, weed pollen, ragweed pollen, tree pollen, and mold as listed below Continue Xyzal 5 mg once a day as needed for runny nose or itch Restart Qnasl 2 sprays in each nostril once a day as needed for a stuffy nose For nasal congestion, continue Mucinex 1200 mg  twice a day and increase fluid intake to thin mucus Begin saline nasal rinses as needed for nasal symptoms. Use this before any medicated nasal sprays for best result  Call the clinic if this treatment plan is not working well for you.  Follow up in 2 months or sooner if needed.   Return in about 2 months (around 11/12/2022), or if symptoms worsen or fail to improve.    Thank you for the opportunity to care for this patient.  Please do not hesitate to contact me with questions.  Gareth Morgan, FNP [facility]

## 2022-09-26 DIAGNOSIS — N921 Excessive and frequent menstruation with irregular cycle: Secondary | ICD-10-CM | POA: Diagnosis not present

## 2022-09-26 DIAGNOSIS — Z6841 Body Mass Index (BMI) 40.0 and over, adult: Secondary | ICD-10-CM | POA: Diagnosis not present

## 2022-09-26 DIAGNOSIS — E538 Deficiency of other specified B group vitamins: Secondary | ICD-10-CM | POA: Diagnosis not present

## 2022-10-03 DIAGNOSIS — E538 Deficiency of other specified B group vitamins: Secondary | ICD-10-CM | POA: Diagnosis not present

## 2022-10-03 DIAGNOSIS — E559 Vitamin D deficiency, unspecified: Secondary | ICD-10-CM | POA: Diagnosis not present

## 2022-10-03 DIAGNOSIS — F5081 Binge eating disorder: Secondary | ICD-10-CM | POA: Diagnosis not present

## 2022-11-13 ENCOUNTER — Other Ambulatory Visit: Payer: Self-pay

## 2022-11-13 ENCOUNTER — Other Ambulatory Visit (HOSPITAL_COMMUNITY): Payer: Self-pay

## 2022-11-13 DIAGNOSIS — E559 Vitamin D deficiency, unspecified: Secondary | ICD-10-CM | POA: Diagnosis not present

## 2022-11-13 DIAGNOSIS — F5081 Binge eating disorder: Secondary | ICD-10-CM | POA: Diagnosis not present

## 2022-11-13 MED ORDER — ZEPBOUND 10 MG/0.5ML ~~LOC~~ SOAJ: 10.0000 mg | SUBCUTANEOUS | 0 refills | Status: DC

## 2022-11-13 MED ORDER — ZEPBOUND 12.5 MG/0.5ML ~~LOC~~ SOAJ
12.5000 mg | SUBCUTANEOUS | 0 refills | Status: DC
  Filled 2022-11-13: qty 2, 28d supply, fill #0

## 2022-11-14 NOTE — Progress Notes (Signed)
[address] [place] [address] Dept: [phone]  FOLLOW UP NOTE  Patient ID: Cynthia [name], female    DOB: [birth year]  Age: 37 y.o. MRN: [id number] Date of Office Visit: 11/15/2022  Assessment  Chief Complaint: Asthma (2 mth f/u - Okay//) and Seasonal and Perennial Allergic Rhinitis (2 mth f/u - Not good)  HPI Cynthia [name] is a 37 year old female who presents to the clinic for follow-up visit.  She was last seen in this clinic on 09/13/2022 by Thermon Leyland, FNP, for evaluation of asthma, allergic rhinitis, and acute sinusitis requiring prednisone and an antibiotic for resolution.  At today's visit, she reports her asthma has been moderately well-controlled with occasional deep cough as the main symptom.  She denies shortness of breath or wheeze.  She continues montelukast 10 mg once a day and rarely needs to use albuterol for relief of asthma symptoms.   Allergic rhinitis is reported as not well-controlled with symptoms including clear rhinorrhea, copious postnasal drainage with frequent throat clearing, and occasional nasal congestion and sneezing especially on windy days.  She does report occasional intermittent hoarseness which is worse during pollen season.  She continues Xyzol daily, Mucinex occasionally, and saline rinses daily.  She has found relief with Qnasl, however, her insurance has not been covering that medication.  Her last environmental allergy testing was on 05/06/2018 and was positive to grass pollen, weed pollen, ragweed pollen, tree pollen, and mold. She is interested in allergen immunotherapy, however, timing is an issue for her. Briefly discussed receiving allergen immunotherapy in an approved medical facility upon mutual agreement with our clinic. She is also interested in sublingual therapy at this time and believes this will be a good fit with her current schedule.   Allergic conjunctivitis is reported as poorly controlled with symptoms including red and itchy  eyes with clear drainage. She reports these symptoms are worse when she has been outside.  She reports experiencing some redness on her face, usually after being outside after lunchtime which lasts for about one hour before complete resolution. She reports this has been occurring for the last several years. She is wondering if this is related to food or environmental allergies. She reports that she is not currently avoiding any foods and the facial redness occurs regardless of the foods she consumed that day. She denies concomitant cardiopulmonary or gastrointestinal symptoms with the facial redness. She denies itch, hives, or raised areas on her face. She continues a daily antihistamine with no improvement in her symptoms. She may be interested in a referral to dermatology in the future.   Her current medications are listed in the chart.   Drug Allergies:  Allergies  Allergen Reactions   Aquacel [Carboxymethylcellulose] Itching, Rash and Other (See Comments)    Caused blisters   Penicillins     Unknown reaction Has patient had a PCN reaction causing immediate rash, facial/tongue/throat swelling, SOB or lightheadedness with hypotension: Unknown Has patient had a PCN reaction causing severe rash involving mucus membranes or skin necrosis: Unknown Has patient had a PCN reaction that required hospitalization: No Has patient had a PCN reaction occurring within the last 10 years: No If all of the above answers are "NO", then may proceed with Cephalosporin use.  Other reaction(s): unknown as a baby   Adhesive [Tape] Rash    Prefers to use paper tape   Latex Rash   Oxycodone Itching and Rash    Physical Exam: BP 110/74 (BP Location: Right Arm, Patient  Position: Sitting, Cuff Size: Large)   Pulse (!) 112   Temp 97.9 F (36.6 C) (Temporal)   Resp 20   Ht 5\' 2"  (1.575 m)   Wt 241 lb 8 oz (109.5 kg)   SpO2 97%   BMI 44.17 kg/m    Physical Exam Vitals reviewed.  Constitutional:       Appearance: Normal appearance.  HENT:     Head: Normocephalic and atraumatic.     Right Ear: Tympanic membrane normal.     Left Ear: Tympanic membrane normal.     Nose:     Comments: Bilateral nares edematous and pale with clear thin nasal drainage noted. Pharynx slightly erythematous with no exudate. Ears normal. Eyes normal. Eyes:     Conjunctiva/sclera: Conjunctivae normal.  Cardiovascular:     Rate and Rhythm: Normal rate and regular rhythm.     Heart sounds: Normal heart sounds. No murmur heard. Pulmonary:     Effort: Pulmonary effort is normal.     Breath sounds: Normal breath sounds.     Comments: Lungs clear to auscultation Musculoskeletal:        General: Normal range of motion.     Cervical back: Normal range of motion and neck supple.  Skin:    General: Skin is warm and dry.  Neurological:     Mental Status: She is alert and oriented to person, place, and time.  Psychiatric:        Mood and Affect: Mood normal.        Behavior: Behavior normal.        Thought Content: Thought content normal.        Judgment: Judgment normal.     Diagnostics: FVC 3.28 which is 94% of predicted value, FEV1 2.76 which is 95% of predicted value.  Spirometry indicates normal ventilatory function.  Assessment and Plan: 1. Mild intermittent asthma without complication   2. Seasonal and perennial allergic rhinitis   3. Seasonal allergic conjunctivitis     Meds ordered this encounter  Medications   Short Ragweed Pollen Ext (RAGWITEK) 12 AMB A 1-U SUBL    Sig: Place 1 tablet under the tongue daily. Bring this prescription to your appointment and you will take the first SL tablet in the clinic.    Dispense:  30 tablet    Refill:  5   Beclomethasone Dipropionate (QNASL) 80 MCG/ACT AERS    Sig: Place 2 Pump into the nose daily as needed.    Dispense:  10.6 g    Refill:  5    Patient Instructions  Asthma Continue montelukast 10 mg once a day to prevent cough or wheeze Continue  albuterol 2 puffs once every 4 hours as needed for cough or wheeze Continue albuterol 2 puffs 5 to 15 minutes before activity to decrease cough or wheeze  Allergic rhinitis Continue allergen avoidance measures directed toward grass pollen, weed pollen, ragweed pollen, tree pollen, and mold as listed below Continue Xyzal 5 mg once a day as needed for runny nose or itch. Remember to rotate to a different antihistamine about every 3 months. Some examples of over the counter antihistamines include Zyrtec (cetirizine), Xyzal (levocetirizine), Allegra (fexofenadine), and Claritin (loratidine).  Restart Qnasl 2 sprays in each nostril once a day as needed for a stuffy nose For nasal congestion, continue Mucinex 1200 mg twice a day and increase fluid intake to thin mucus Continue saline nasal rinses as needed for nasal symptoms. Use this before any medicated nasal sprays for best  result Consider Ragwitek sublingual allergen immunotherapy. Will submit to the pharmacy to explore cost  Allergic conjunctivitis Some over the counter eye drops include Pataday one drop in each eye once a day as needed for red, itchy eyes OR Zaditor one drop in each eye twice a day as needed for red itchy eyes. Avoid eye drops that say red eye relief as they may contain medications that dry out your eyes.   Call the clinic if this treatment plan is not working well for you.  Follow up in 3 months or sooner if needed.   Return in about 3 months (around 02/14/2023), or if symptoms worsen or fail to improve.    Thank you for the opportunity to care for this patient.  Please do not hesitate to contact me with questions.  Thermon Leyland, FNP [facility]

## 2022-11-14 NOTE — Patient Instructions (Incomplete)
Asthma Continue montelukast 10 mg once a day to prevent cough or wheeze Continue albuterol 2 puffs once every 4 hours as needed for cough or wheeze Continue albuterol 2 puffs 5 to 15 minutes before activity to decrease cough or wheeze  Allergic rhinitis Continue allergen avoidance measures directed toward grass pollen, weed pollen, ragweed pollen, tree pollen, and mold as listed below Continue Xyzal 5 mg once a day as needed for runny nose or itch. Remember to rotate to a different antihistamine about every 3 months. Some examples of over the counter antihistamines include Zyrtec (cetirizine), Xyzal (levocetirizine), Allegra (fexofenadine), and Claritin (loratidine).  Restart Qnasl 2 sprays in each nostril once a day as needed for a stuffy nose For nasal congestion, continue Mucinex 1200 mg twice a day and increase fluid intake to thin mucus Continue saline nasal rinses as needed for nasal symptoms. Use this before any medicated nasal sprays for best result Consider Ragwitek sublingual allergen immunotherapy. Will submit to the pharmacy to explore cost  Allergic conjunctivitis Some over the counter eye drops include Pataday one drop in each eye once a day as needed for red, itchy eyes OR Zaditor one drop in each eye twice a day as needed for red itchy eyes. Avoid eye drops that say red eye relief as they may contain medications that dry out your eyes.   Call the clinic if this treatment plan is not working well for you.  Follow up in 3 months or sooner if needed.  Reducing Pollen Exposure The American Academy of Allergy, Asthma and Immunology suggests the following steps to reduce your exposure to pollen during allergy seasons. Do not hang sheets or clothing out to dry; pollen may collect on these items. Do not mow lawns or spend time around freshly cut grass; mowing stirs up pollen. Keep windows closed at night.  Keep car windows closed while driving. Minimize morning activities outdoors,  a time when pollen counts are usually at their highest. Stay indoors as much as possible when pollen counts or humidity is high and on windy days when pollen tends to remain in the air longer. Use air conditioning when possible.  Many air conditioners have filters that trap the pollen spores. Use a HEPA room air filter to remove pollen form the indoor air you breathe.  Control of Mold Allergen Mold and fungi can grow on a variety of surfaces provided certain temperature and moisture conditions exist.  Outdoor molds grow on plants, decaying vegetation and soil.  The major outdoor mold, Alternaria and Cladosporium, are found in very high numbers during hot and dry conditions.  Generally, a late Summer - Fall peak is seen for common outdoor fungal spores.  Rain will temporarily lower outdoor mold spore count, but counts rise rapidly when the rainy period ends.  The most important indoor molds are Aspergillus and Penicillium.  Dark, humid and poorly ventilated basements are ideal sites for mold growth.  The next most common sites of mold growth are the bathroom and the kitchen.  Outdoor [employer] Use air conditioning and keep windows closed Avoid exposure to decaying vegetation. Avoid leaf raking. Avoid grain handling. Consider wearing a face mask if working in moldy areas.  Indoor Mold Control Maintain humidity below 50%. Clean washable surfaces with 5% bleach solution. Remove sources e.g. Contaminated carpets.

## 2022-11-15 ENCOUNTER — Other Ambulatory Visit: Payer: Self-pay

## 2022-11-15 ENCOUNTER — Ambulatory Visit: Payer: BC Managed Care – PPO | Admitting: Family Medicine

## 2022-11-15 ENCOUNTER — Encounter: Payer: Self-pay | Admitting: Family Medicine

## 2022-11-15 VITALS — BP 110/74 | HR 112 | Temp 97.9°F | Resp 20 | Ht 62.0 in | Wt 241.5 lb

## 2022-11-15 DIAGNOSIS — J302 Other seasonal allergic rhinitis: Secondary | ICD-10-CM

## 2022-11-15 DIAGNOSIS — J3089 Other allergic rhinitis: Secondary | ICD-10-CM | POA: Diagnosis not present

## 2022-11-15 DIAGNOSIS — J4520 Mild intermittent asthma, uncomplicated: Secondary | ICD-10-CM

## 2022-11-15 DIAGNOSIS — H1013 Acute atopic conjunctivitis, bilateral: Secondary | ICD-10-CM

## 2022-11-15 DIAGNOSIS — H1010 Acute atopic conjunctivitis, unspecified eye: Secondary | ICD-10-CM

## 2022-11-16 ENCOUNTER — Encounter: Payer: Self-pay | Admitting: Family Medicine

## 2022-11-16 MED ORDER — QNASL 80 MCG/ACT NA AERS: 2.0000 | INHALATION_SPRAY | Freq: Every day | NASAL | 5 refills | Status: DC | PRN

## 2022-11-16 MED ORDER — RAGWITEK 12 AMB A 1-U SL SUBL: 1.0000 | SUBLINGUAL_TABLET | Freq: Every day | SUBLINGUAL | 5 refills | Status: DC

## 2022-11-19 ENCOUNTER — Other Ambulatory Visit (HOSPITAL_COMMUNITY): Payer: Self-pay

## 2022-11-19 ENCOUNTER — Telehealth: Payer: Self-pay

## 2022-11-19 NOTE — Telephone Encounter (Signed)
PA request received via fax for Qnasl 80MCG/ACT aerosol  PA has been submitted to [employer] via [employer] and is pending additional questions/determination  Key: [id number]

## 2022-11-21 ENCOUNTER — Other Ambulatory Visit (HOSPITAL_COMMUNITY): Payer: Self-pay

## 2022-11-21 NOTE — Telephone Encounter (Signed)
PA not needed at this time, pharmacy processed for a co-pay of $104.22

## 2022-11-28 DIAGNOSIS — F900 Attention-deficit hyperactivity disorder, predominantly inattentive type: Secondary | ICD-10-CM | POA: Diagnosis not present

## 2022-11-28 DIAGNOSIS — Z9189 Other specified personal risk factors, not elsewhere classified: Secondary | ICD-10-CM | POA: Diagnosis not present

## 2022-11-28 DIAGNOSIS — M62838 Other muscle spasm: Secondary | ICD-10-CM | POA: Diagnosis not present

## 2022-11-28 DIAGNOSIS — F411 Generalized anxiety disorder: Secondary | ICD-10-CM | POA: Diagnosis not present

## 2022-11-29 ENCOUNTER — Other Ambulatory Visit (INDEPENDENT_AMBULATORY_CARE_PROVIDER_SITE_OTHER): Payer: Self-pay | Admitting: Adult Health

## 2022-11-29 DIAGNOSIS — F411 Generalized anxiety disorder: Secondary | ICD-10-CM

## 2022-12-08 ENCOUNTER — Other Ambulatory Visit (HOSPITAL_BASED_OUTPATIENT_CLINIC_OR_DEPARTMENT_OTHER): Payer: Self-pay

## 2022-12-08 MED ORDER — TIRZEPATIDE-WEIGHT MANAGEMENT 15 MG/0.5ML ~~LOC~~ SOAJ
15.0000 mg | SUBCUTANEOUS | 0 refills | Status: DC
  Filled 2022-12-08: qty 2, 28d supply, fill #0

## 2022-12-10 ENCOUNTER — Encounter: Payer: Self-pay | Admitting: Family Medicine

## 2022-12-10 NOTE — Telephone Encounter (Signed)
Cynthia [name]!!! Please make an appointment for your first dose. Plan to stay for 30 minute observation. Please order epipen per immunotherapy protocol. Thank you

## 2022-12-11 MED ORDER — EPINEPHRINE 0.3 MG/0.3ML IJ SOAJ: 0.3000 mg | INTRAMUSCULAR | 2 refills | Status: AC | PRN

## 2022-12-14 ENCOUNTER — Encounter: Payer: Self-pay | Admitting: Family Medicine

## 2022-12-14 ENCOUNTER — Ambulatory Visit: Payer: BC Managed Care – PPO | Admitting: Family Medicine

## 2022-12-14 ENCOUNTER — Other Ambulatory Visit: Payer: Self-pay

## 2022-12-14 VITALS — BP 108/70 | HR 103 | Temp 97.9°F | Resp 16 | Wt 239.0 lb

## 2022-12-14 DIAGNOSIS — H1010 Acute atopic conjunctivitis, unspecified eye: Secondary | ICD-10-CM

## 2022-12-14 DIAGNOSIS — J4520 Mild intermittent asthma, uncomplicated: Secondary | ICD-10-CM

## 2022-12-14 DIAGNOSIS — H1013 Acute atopic conjunctivitis, bilateral: Secondary | ICD-10-CM

## 2022-12-14 DIAGNOSIS — J3089 Other allergic rhinitis: Secondary | ICD-10-CM

## 2022-12-14 DIAGNOSIS — J302 Other seasonal allergic rhinitis: Secondary | ICD-10-CM | POA: Diagnosis not present

## 2022-12-14 NOTE — Progress Notes (Signed)
[address] [place] [address] Dept: [phone]  FOLLOW UP NOTE  Patient ID: Cynthia [name], female    DOB: December 22, 1985  Age: 37 y.o. MRN: [id number] Date of Office Visit: 12/14/2022  Assessment  Chief Complaint: Other (Medication start/observation)  HPI Cynthia [name] is a 37 year old female who presents to the clinic for observation with first dose of Ragwitek.  She was last seen in this clinic on 11/15/2022 for evaluation of asthma, allergic rhinitis, and allergic conjunctivitis.  At today's visit, she reports that she is feeling well with no symptoms including cardiopulmonary, gastrointestinal, or integumentary.  She continues an antihistamines once a day.   Drug Allergies:  Allergies  Allergen Reactions   Aquacel [Carboxymethylcellulose] Itching, Rash and Other (See Comments)    Caused blisters   Penicillins     Unknown reaction Has patient had a PCN reaction causing immediate rash, facial/tongue/throat swelling, SOB or lightheadedness with hypotension: Unknown Has patient had a PCN reaction causing severe rash involving mucus membranes or skin necrosis: Unknown Has patient had a PCN reaction that required hospitalization: No Has patient had a PCN reaction occurring within the last 10 years: No If all of the above answers are "NO", then may proceed with Cephalosporin use.  Other reaction(s): unknown as a baby   Adhesive [Tape] Rash    Prefers to use paper tape   Latex Rash   Oxycodone Itching and Rash    Physical Exam: BP 108/70   Pulse (!) 103   Temp 97.9 F (36.6 C) (Temporal)   Resp 16   Wt 239 lb (108.4 kg)   SpO2 100%   BMI 43.71 kg/m    Physical Exam Vitals reviewed.  Constitutional:      Appearance: Normal appearance.  HENT:     Head: Normocephalic and atraumatic.     Right Ear: Tympanic membrane normal.     Left Ear: Tympanic membrane normal.     Nose:     Comments: Bilateral nares normal.  Pharynx normal.  Ears normal.  Eyes normal.     Mouth/Throat:     Pharynx: Oropharynx is clear.  Eyes:     Conjunctiva/sclera: Conjunctivae normal.  Cardiovascular:     Rate and Rhythm: Normal rate and regular rhythm.     Heart sounds: Normal heart sounds. No murmur heard. Pulmonary:     Effort: Pulmonary effort is normal.     Breath sounds: Normal breath sounds.     Comments: Lungs clear to auscultation Musculoskeletal:        General: Normal range of motion.     Cervical back: Normal range of motion and neck supple.  Skin:    General: Skin is warm and dry.  Neurological:     Mental Status: She is alert and oriented to person, place, and time.  Psychiatric:        Mood and Affect: Mood normal.        Behavior: Behavior normal.        Thought Content: Thought content normal.        Judgment: Judgment normal.     Assessment and Plan: 1. Mild intermittent asthma without complication   2. Seasonal and perennial allergic rhinitis   3. Seasonal allergic conjunctivitis     No orders of the defined types were placed in this encounter.   Patient Instructions  Asthma Continue montelukast 10 mg once a day to prevent cough or wheeze Continue albuterol 2 puffs once every 4 hours as needed for  cough or wheeze Continue albuterol 2 puffs 5 to 15 minutes before activity to decrease cough or wheeze  Allergic rhinitis Continue Ragwitek 1 sublingual tablet once a day and have access to an epinephrine auto injector set per protocol Continue allergen avoidance measures directed toward grass pollen, weed pollen, ragweed pollen, tree pollen, and mold as listed below Continue Xyzal 5 mg once a day as needed for runny nose or itch. Remember to rotate to a different antihistamine about every 3 months. Some examples of over the counter antihistamines include Zyrtec (cetirizine), Xyzal (levocetirizine), Allegra (fexofenadine), and Claritin (loratidine).  Restart Qnasl 2 sprays in each nostril once a day as needed for a stuffy nose For nasal  congestion, continue Mucinex 1200 mg twice a day and increase fluid intake to thin mucus Continue saline nasal rinses as needed for nasal symptoms. Use this before any medicated nasal sprays for best result  Allergic conjunctivitis Some over the counter eye drops include Pataday one drop in each eye once a day as needed for red, itchy eyes OR Zaditor one drop in each eye twice a day as needed for red itchy eyes. Avoid eye drops that say red eye relief as they may contain medications that dry out your eyes.   Call the clinic if this treatment plan is not working well for you.  Follow up in 3 months or sooner if needed.   Return in about 3 months (around 03/16/2023), or if symptoms worsen or fail to improve.    Thank you for the opportunity to care for this patient.  Please do not hesitate to contact me with questions.  Thermon Leyland, FNP [facility]

## 2022-12-14 NOTE — Progress Notes (Signed)
,  kk

## 2022-12-14 NOTE — Patient Instructions (Signed)
Asthma Continue montelukast 10 mg once a day to prevent cough or wheeze Continue albuterol 2 puffs once every 4 hours as needed for cough or wheeze Continue albuterol 2 puffs 5 to 15 minutes before activity to decrease cough or wheeze  Allergic rhinitis Continue Ragwitek 1 sublingual tablet once a day and have access to an epinephrine auto injector set per protocol Continue allergen avoidance measures directed toward grass pollen, weed pollen, ragweed pollen, tree pollen, and mold as listed below Continue Xyzal 5 mg once a day as needed for runny nose or itch. Remember to rotate to a different antihistamine about every 3 months. Some examples of over the counter antihistamines include Zyrtec (cetirizine), Xyzal (levocetirizine), Allegra (fexofenadine), and Claritin (loratidine).  Restart Qnasl 2 sprays in each nostril once a day as needed for a stuffy nose For nasal congestion, continue Mucinex 1200 mg twice a day and increase fluid intake to thin mucus Continue saline nasal rinses as needed for nasal symptoms. Use this before any medicated nasal sprays for best result  Allergic conjunctivitis Some over the counter eye drops include Pataday one drop in each eye once a day as needed for red, itchy eyes OR Zaditor one drop in each eye twice a day as needed for red itchy eyes. Avoid eye drops that say red eye relief as they may contain medications that dry out your eyes.   Call the clinic if this treatment plan is not working well for you.  Follow up in 3 months or sooner if needed.  Reducing Pollen Exposure The American Academy of Allergy, Asthma and Immunology suggests the following steps to reduce your exposure to pollen during allergy seasons. Do not hang sheets or clothing out to dry; pollen may collect on these items. Do not mow lawns or spend time around freshly cut grass; mowing stirs up pollen. Keep windows closed at night.  Keep car windows closed while driving. Minimize morning  activities outdoors, a time when pollen counts are usually at their highest. Stay indoors as much as possible when pollen counts or humidity is high and on windy days when pollen tends to remain in the air longer. Use air conditioning when possible.  Many air conditioners have filters that trap the pollen spores. Use a HEPA room air filter to remove pollen form the indoor air you breathe.  Control of Mold Allergen Mold and fungi can grow on a variety of surfaces provided certain temperature and moisture conditions exist.  Outdoor molds grow on plants, decaying vegetation and soil.  The major outdoor mold, Alternaria and Cladosporium, are found in very high numbers during hot and dry conditions.  Generally, a late Summer - Fall peak is seen for common outdoor fungal spores.  Rain will temporarily lower outdoor mold spore count, but counts rise rapidly when the rainy period ends.  The most important indoor molds are Aspergillus and Penicillium.  Dark, humid and poorly ventilated basements are ideal sites for mold growth.  The next most common sites of mold growth are the bathroom and the kitchen.  Outdoor [employer] Use air conditioning and keep windows closed Avoid exposure to decaying vegetation. Avoid leaf raking. Avoid grain handling. Consider wearing a face mask if working in moldy areas.  Indoor Mold Control Maintain humidity below 50%. Clean washable surfaces with 5% bleach solution. Remove sources e.g. Contaminated carpets.

## 2022-12-19 ENCOUNTER — Other Ambulatory Visit (HOSPITAL_BASED_OUTPATIENT_CLINIC_OR_DEPARTMENT_OTHER): Payer: Self-pay

## 2023-01-17 DIAGNOSIS — D519 Vitamin B12 deficiency anemia, unspecified: Secondary | ICD-10-CM | POA: Diagnosis not present

## 2023-01-17 DIAGNOSIS — G8929 Other chronic pain: Secondary | ICD-10-CM | POA: Diagnosis not present

## 2023-01-17 DIAGNOSIS — E559 Vitamin D deficiency, unspecified: Secondary | ICD-10-CM | POA: Diagnosis not present

## 2023-01-23 ENCOUNTER — Other Ambulatory Visit: Payer: Self-pay | Admitting: Family Medicine

## 2023-01-23 ENCOUNTER — Encounter: Payer: Self-pay | Admitting: Family Medicine

## 2023-01-23 DIAGNOSIS — M545 Low back pain, unspecified: Secondary | ICD-10-CM | POA: Diagnosis not present

## 2023-01-23 DIAGNOSIS — N200 Calculus of kidney: Secondary | ICD-10-CM | POA: Diagnosis not present

## 2023-01-23 DIAGNOSIS — R195 Other fecal abnormalities: Secondary | ICD-10-CM | POA: Diagnosis not present

## 2023-01-23 DIAGNOSIS — R319 Hematuria, unspecified: Secondary | ICD-10-CM | POA: Diagnosis not present

## 2023-01-23 DIAGNOSIS — R140 Abdominal distension (gaseous): Secondary | ICD-10-CM | POA: Diagnosis not present

## 2023-02-06 DIAGNOSIS — D519 Vitamin B12 deficiency anemia, unspecified: Secondary | ICD-10-CM | POA: Diagnosis not present

## 2023-02-06 DIAGNOSIS — E559 Vitamin D deficiency, unspecified: Secondary | ICD-10-CM | POA: Diagnosis not present

## 2023-02-07 ENCOUNTER — Other Ambulatory Visit (HOSPITAL_COMMUNITY): Payer: Self-pay

## 2023-02-07 ENCOUNTER — Telehealth: Payer: Self-pay

## 2023-02-07 NOTE — Telephone Encounter (Signed)
*  Allergy/Asthma  PA request received for Qnasl 80MCG/ACT aerosol  PA submitted to [employer] via [employer] and is pending additional questions/determination  Key: [id number]

## 2023-02-13 ENCOUNTER — Encounter: Payer: Self-pay | Admitting: Urology

## 2023-02-13 ENCOUNTER — Ambulatory Visit: Payer: BC Managed Care – PPO | Admitting: Urology

## 2023-02-13 VITALS — BP 134/88 | HR 114 | Ht 62.0 in | Wt 242.0 lb

## 2023-02-13 DIAGNOSIS — M5489 Other dorsalgia: Secondary | ICD-10-CM | POA: Diagnosis not present

## 2023-02-13 DIAGNOSIS — N200 Calculus of kidney: Secondary | ICD-10-CM

## 2023-02-13 LAB — URINALYSIS, ROUTINE W REFLEX MICROSCOPIC
Bilirubin, UA: NEGATIVE
Glucose, UA: NEGATIVE
Ketones, UA: NEGATIVE
Leukocytes,UA: NEGATIVE
Nitrite, UA: NEGATIVE
Protein,UA: NEGATIVE
RBC, UA: NEGATIVE
Specific Gravity, UA: 1.025 (ref 1.005–1.030)
Urobilinogen, Ur: 0.2 mg/dL (ref 0.2–1.0)
pH, UA: 6.5 (ref 5.0–7.5)

## 2023-02-13 NOTE — Progress Notes (Signed)
Assessment: 1. Nephrolithiasis   2. Back pain without sciatica     Plan: I personally reviewed the patient's chart including provider notes, lab and imaging results. I personally reviewed the CT study from 01/23/2023 with results as noted below.  There is a 2 mm calculus in the right mid kidney without evidence of obstruction.  The left sided stone is not easily visualized. I do not think that the stones are causing any of her symptoms as there is no evidence of obstruction. Recommend evaluation for musculoskeletal etiology Stone prevention information provided Return to office prn  Chief Complaint:  Chief Complaint  Patient presents with   Nephrolithiasis    History of Present Illness:  Cynthia [name] is a 37 y.o. female who is seen in consultation from Jarrett Soho, [place] for evaluation of nephrolithiasis.  She has a history of bilateral flank pain x 1 month.  She reports this initially began on the left side.  She has since developed right-sided pain just lower her bra line.  She reports the pain as sharp and colicky.  It is made worse with movement and bending.  It improves with remaining still.  She currently has pain in the left lower quadrant.  No fevers or chills.  She has had some urgency and dysuria.  A urinalysis performed at her PCP apparently showed evidence of hematuria.  CT abdomen and pelvis without contrast from 01/23/2023 performed at [facility] showed a punctate 2 mm midpole right renal calculus and a probable punctate 1-2 mm lower pole left renal calculus, no ureteral calculi or evidence of obstruction.   Past Medical History:  Past Medical History:  Diagnosis Date   ADHD (attention deficit hyperactivity disorder)    Allergic rhinitis    Anxiety    Asthma    Back injury    COMPETITIVE INJURY IN HIGH SCHOOL   Back pain    Depression    Fatty liver    Fibromyalgia    Gallbladder problem    GERD (gastroesophageal reflux disease)    IBS (irritable bowel  syndrome)    Insulin resistance    Joint pain    Migraines    OA (osteoarthritis) of knee    Obesity    Pneumonia    PONV (postoperative nausea and vomiting)    Seasonal allergies    Vitamin D deficiency     Past Surgical History:  Past Surgical History:  Procedure Laterality Date   CHOLECYSTECTOMY     DG DILATION URETERS     KNEE ARTHROSCOPY Left    KNEE ARTHROSCOPY WITH LATERAL MENISECTOMY Right 04/11/2018   Procedure: RIGHT KNEE ARTHROSCOPY WITH PARTIAL MENISECTOMY CHONDROPLASTY;  Surgeon: Eugenia Mcalpine, MD;  Location: [facility];  Service: Orthopedics;  Laterality: Right;   MOUTH SURGERY     SHOULDER SURGERY  2002   RIGHT   WISDOM TOOTH EXTRACTION     WRIST ARTHROSCOPY     Right    Allergies:  Allergies  Allergen Reactions   Aquacel [Carboxymethylcellulose] Itching, Rash and Other (See Comments)    Caused blisters   Penicillins     Unknown reaction Has patient had a PCN reaction causing immediate rash, facial/tongue/throat swelling, SOB or lightheadedness with hypotension: Unknown Has patient had a PCN reaction causing severe rash involving mucus membranes or skin necrosis: Unknown Has patient had a PCN reaction that required hospitalization: No Has patient had a PCN reaction occurring within the last 10 years: No If all of the above answers are "NO", then  may proceed with Cephalosporin use.  Other reaction(s): unknown as a baby   Adhesive [Tape] Rash    Prefers to use paper tape   Latex Rash   Oxycodone Itching and Rash    Family History:  Family History  Problem Relation Age of Onset   Multiple sclerosis Mother    Diabetes Mother    Hyperlipidemia Mother    Thyroid disease Mother    Depression Mother    Anxiety disorder Mother    Obesity Mother    Allergic rhinitis Mother    Hypertension Father    Diabetes Father    Hyperlipidemia Father    Obesity Father    Allergic rhinitis Father    Diabetes Maternal Grandfather    Cancer Maternal Grandfather         prostate   Heart disease Maternal Grandfather    Heart disease Paternal Grandfather    Allergic rhinitis Paternal Aunt    Allergic rhinitis Maternal Grandmother    Asthma Neg Hx    Eczema Neg Hx    Urticaria Neg Hx     Social History:  Social History   Tobacco Use   Smoking status: Never   Smokeless tobacco: Never  Vaping Use   Vaping status: Never Used  Substance Use Topics   Alcohol use: Yes    Comment: socially 2-3 drinks per month   Drug use: No    Review of symptoms:  Constitutional:  Negative for unexplained weight loss, night sweats, fever, chills ENT:  Negative for nose bleeds, sinus pain, painful swallowing CV:  Negative for chest pain, shortness of breath, exercise intolerance, palpitations, loss of consciousness Resp:  Negative for cough, wheezing, shortness of breath GI:  Negative for nausea, vomiting, diarrhea, bloody stools GU:  Positives noted in HPI; otherwise negative for gross hematuria, dysuria, urinary incontinence Neuro:  Negative for seizures, poor balance, limb weakness, slurred speech Psych:  Negative for lack of energy, depression, anxiety Endocrine:  Negative for polydipsia, polyuria, symptoms of hypoglycemia (dizziness, hunger, sweating) Hematologic:  Negative for anemia, purpura, petechia, prolonged or excessive bleeding, use of anticoagulants  Allergic:  Negative for difficulty breathing or choking as a result of exposure to anything; no shellfish allergy; no allergic response (rash/itch) to materials, foods  Physical exam: BP 134/88   Pulse (!) 114   Ht 5\' 2"  (1.575 m)   Wt 242 lb (109.8 kg)   BMI 44.26 kg/m  GENERAL APPEARANCE:  Well appearing, well developed, well nourished, NAD HEENT: Atraumatic, Normocephalic, oropharynx clear. NECK: Supple without lymphadenopathy or thyromegaly. LUNGS: Clear to auscultation bilaterally. HEART: Regular Rate and Rhythm without murmurs, gallops, or rubs. ABDOMEN: Soft, non-tender, No  Masses. EXTREMITIES: Moves all extremities well.  Without clubbing, cyanosis, or edema. NEUROLOGIC:  Alert and oriented x 3, normal gait, CN II-XII grossly intact.  MENTAL STATUS:  Appropriate. BACK:  Tender to palpation in paraspinal area on right thoracic area.  No CVAT SKIN:  Warm, dry and intact.    Results: U/A:  negative

## 2023-02-14 ENCOUNTER — Ambulatory Visit: Payer: BC Managed Care – PPO | Admitting: Family Medicine

## 2023-02-21 ENCOUNTER — Other Ambulatory Visit (HOSPITAL_COMMUNITY): Payer: Self-pay

## 2023-02-25 DIAGNOSIS — M797 Fibromyalgia: Secondary | ICD-10-CM | POA: Diagnosis not present

## 2023-02-25 DIAGNOSIS — R319 Hematuria, unspecified: Secondary | ICD-10-CM | POA: Diagnosis not present

## 2023-02-25 DIAGNOSIS — M545 Low back pain, unspecified: Secondary | ICD-10-CM | POA: Diagnosis not present

## 2023-02-27 ENCOUNTER — Other Ambulatory Visit (HOSPITAL_COMMUNITY): Payer: Self-pay

## 2023-02-27 DIAGNOSIS — E559 Vitamin D deficiency, unspecified: Secondary | ICD-10-CM | POA: Diagnosis not present

## 2023-02-27 DIAGNOSIS — F900 Attention-deficit hyperactivity disorder, predominantly inattentive type: Secondary | ICD-10-CM | POA: Diagnosis not present

## 2023-02-27 DIAGNOSIS — E538 Deficiency of other specified B group vitamins: Secondary | ICD-10-CM | POA: Diagnosis not present

## 2023-03-04 ENCOUNTER — Other Ambulatory Visit (HOSPITAL_COMMUNITY): Payer: Self-pay

## 2023-03-04 NOTE — Telephone Encounter (Signed)
Member not found under plan in [facility]

## 2023-03-14 ENCOUNTER — Other Ambulatory Visit (HOSPITAL_COMMUNITY): Payer: Self-pay

## 2023-03-18 ENCOUNTER — Ambulatory Visit: Payer: BC Managed Care – PPO | Admitting: Family Medicine

## 2023-03-21 DIAGNOSIS — F411 Generalized anxiety disorder: Secondary | ICD-10-CM | POA: Diagnosis not present

## 2023-03-21 DIAGNOSIS — F900 Attention-deficit hyperactivity disorder, predominantly inattentive type: Secondary | ICD-10-CM | POA: Diagnosis not present

## 2023-03-21 DIAGNOSIS — M797 Fibromyalgia: Secondary | ICD-10-CM | POA: Diagnosis not present

## 2023-04-10 DIAGNOSIS — F900 Attention-deficit hyperactivity disorder, predominantly inattentive type: Secondary | ICD-10-CM | POA: Diagnosis not present

## 2023-04-10 DIAGNOSIS — M797 Fibromyalgia: Secondary | ICD-10-CM | POA: Diagnosis not present

## 2023-04-10 DIAGNOSIS — F418 Other specified anxiety disorders: Secondary | ICD-10-CM | POA: Diagnosis not present

## 2023-04-18 ENCOUNTER — Other Ambulatory Visit: Payer: Self-pay

## 2023-04-18 ENCOUNTER — Encounter: Payer: Self-pay | Admitting: Family Medicine

## 2023-04-18 ENCOUNTER — Ambulatory Visit: Payer: BC Managed Care – PPO | Admitting: Family Medicine

## 2023-04-18 VITALS — BP 110/60 | HR 101 | Temp 98.1°F | Resp 12 | Wt 241.4 lb

## 2023-04-18 DIAGNOSIS — H1013 Acute atopic conjunctivitis, bilateral: Secondary | ICD-10-CM | POA: Diagnosis not present

## 2023-04-18 DIAGNOSIS — J302 Other seasonal allergic rhinitis: Secondary | ICD-10-CM

## 2023-04-18 DIAGNOSIS — J4520 Mild intermittent asthma, uncomplicated: Secondary | ICD-10-CM | POA: Diagnosis not present

## 2023-04-18 DIAGNOSIS — J3089 Other allergic rhinitis: Secondary | ICD-10-CM

## 2023-04-18 DIAGNOSIS — H1010 Acute atopic conjunctivitis, unspecified eye: Secondary | ICD-10-CM

## 2023-04-18 MED ORDER — MONTELUKAST SODIUM 10 MG PO TABS: ORAL_TABLET | ORAL | 1 refills | Status: AC

## 2023-04-18 MED ORDER — RAGWITEK 12 AMB A 1-U SL SUBL: 1.0000 | SUBLINGUAL_TABLET | Freq: Every day | SUBLINGUAL | 5 refills | Status: DC

## 2023-04-18 MED ORDER — ALBUTEROL SULFATE HFA 108 (90 BASE) MCG/ACT IN AERS: 2.0000 | INHALATION_SPRAY | RESPIRATORY_TRACT | 3 refills | Status: AC | PRN

## 2023-04-18 NOTE — Patient Instructions (Addendum)
Asthma Continue montelukast 10 mg once a day to prevent cough or wheeze Continue albuterol 2 puffs once every 4 hours as needed for cough or wheeze Continue albuterol 2 puffs 5 to 15 minutes before activity to decrease cough or wheeze  Allergic rhinitis Continue Ragwitek 1 sublingual tablet once a day and have access to an epinephrine auto injector set per protocol. Take this medication until the first frost-usually in about November  Continue allergen avoidance measures directed toward grass pollen, weed pollen, ragweed pollen, tree pollen, and mold as listed below Continue Xyzal 5 mg once a day as needed for runny nose or itch. Remember to rotate to a different antihistamine about every 3 months. Some examples of over the counter antihistamines include Zyrtec (cetirizine), Xyzal (levocetirizine), Allegra (fexofenadine), and Claritin (loratidine).  Continue Qnasl 2 sprays in each nostril once a day as needed for a stuffy nose For nasal congestion, continue Mucinex 1200 mg twice a day and increase fluid intake to thin mucus Continue saline nasal rinses as needed for nasal symptoms. Use this before any medicated nasal sprays for best result  Allergic conjunctivitis Some over the counter eye drops include Pataday one drop in each eye once a day as needed for red, itchy eyes OR Zaditor one drop in each eye twice a day as needed for red itchy eyes. Avoid eye drops that say red eye relief as they may contain medications that dry out your eyes.   Call the clinic if this treatment plan is not working well for you.  Follow up in 6 months or sooner if needed.  Reducing Pollen Exposure The American Academy of Allergy, Asthma and Immunology suggests the following steps to reduce your exposure to pollen during allergy seasons. Do not hang sheets or clothing out to dry; pollen may collect on these items. Do not mow lawns or spend time around freshly cut grass; mowing stirs up pollen. Keep windows closed  at night.  Keep car windows closed while driving. Minimize morning activities outdoors, a time when pollen counts are usually at their highest. Stay indoors as much as possible when pollen counts or humidity is high and on windy days when pollen tends to remain in the air longer. Use air conditioning when possible.  Many air conditioners have filters that trap the pollen spores. Use a HEPA room air filter to remove pollen form the indoor air you breathe.  Control of Mold Allergen Mold and fungi can grow on a variety of surfaces provided certain temperature and moisture conditions exist.  Outdoor molds grow on plants, decaying vegetation and soil.  The major outdoor mold, Alternaria and Cladosporium, are found in very high numbers during hot and dry conditions.  Generally, a late Summer - Fall peak is seen for common outdoor fungal spores.  Rain will temporarily lower outdoor mold spore count, but counts rise rapidly when the rainy period ends.  The most important indoor molds are Aspergillus and Penicillium.  Dark, humid and poorly ventilated basements are ideal sites for mold growth.  The next most common sites of mold growth are the bathroom and the kitchen.  Outdoor [employer] Use air conditioning and keep windows closed Avoid exposure to decaying vegetation. Avoid leaf raking. Avoid grain handling. Consider wearing a face mask if working in moldy areas.  Indoor Mold Control Maintain humidity below 50%. Clean washable surfaces with 5% bleach solution. Remove sources e.g. Contaminated carpets.

## 2023-04-18 NOTE — Progress Notes (Signed)
[address] [place] [address] Dept: [phone]  FOLLOW UP NOTE  Patient ID: Cynthia [name], female    DOB: 05-07-1986  Age: 37 y.o. MRN: [id number] Date of Office Visit: 04/18/2023  Assessment  Chief Complaint: Follow-up (Getting hoarse lately but could be from work requirements.)  HPI Cynthia [name] is a 37 year old female who presents to the clinic for follow-up visit.  She was last seen in this clinic on 12/14/2022 by Thermon Leyland, FNP, for evaluation of asthma, allergic rhinitis, and allergic conjunctivitis.    At today's visit, she reports her asthma has been moderately well-controlled with symptoms including occasional dry cough and heavy breathing for the last 2 weeks.  She denies shortness of breath or wheeze with activity or rest.  She does report some recent changes at work which may be contributing to breathing issues.  She reports some recent hoarseness and notes that her job has required her to talk more frequently.  She continues montelukast 10 mg once a day and rarely uses albuterol for relief of asthma symptoms.  Allergic rhinitis is reported as moderately well-controlled with occasional nasal congestion as the main symptom.  She currently denies rhinorrhea, sneezing, or postnasal drainage.  She continues taking an antihistamine once a day, alternating between Xyzal and Allegra.  She rarely uses Qnasl and occasionally uses saline nasal rinses with relief of symptoms. Her last environmental allergy testing was on 05/06/2018 and was positive to grass pollen, weed pollen, ragweed pollen, tree pollen, and mold.  She began Ragwitek sublingual oral immunotherapy on 12/14/2022.  She continues Ragwitek about 5 days a week.  She reports that her symptoms of allergic rhinitis have decreased while continuing on Ragwitek.  She will continue beginning in about July and continuing until the first frost or the end of November.  Epinephrine autoinjector set is up-to-date.  Allergic  conjunctivitis is reported as moderately well-controlled with occasional red and itchy eyes for which she is not currently using any medical intervention.  Her current medications are listed in the chart.  Drug Allergies:  Allergies  Allergen Reactions   Aquacel [Carboxymethylcellulose] Itching, Rash and Other (See Comments)    Caused blisters   Penicillins     Unknown reaction Has patient had a PCN reaction causing immediate rash, facial/tongue/throat swelling, SOB or lightheadedness with hypotension: Unknown Has patient had a PCN reaction causing severe rash involving mucus membranes or skin necrosis: Unknown Has patient had a PCN reaction that required hospitalization: No Has patient had a PCN reaction occurring within the last 10 years: No If all of the above answers are "NO", then may proceed with Cephalosporin use.  Other reaction(s): unknown as a baby   Adhesive [Tape] Rash    Prefers to use paper tape   Latex Rash   Oxycodone Itching and Rash    Physical Exam: BP 110/60   Pulse (!) 101   Temp 98.1 F (36.7 C) (Temporal)   Resp 12   Wt 241 lb 6.4 oz (109.5 kg)   SpO2 97%   BMI 44.15 kg/m    Physical Exam Vitals reviewed.  Constitutional:      Appearance: Normal appearance.  HENT:     Head: Normocephalic and atraumatic.     Right Ear: Tympanic membrane normal.     Left Ear: Tympanic membrane normal.     Nose:     Comments: Bilateral nares slightly erythematous with thin clear nasal drainage noted.  Pharynx normal.  Ears normal.  Eyes normal.  Mouth/Throat:     Pharynx: Oropharynx is clear.  Eyes:     Conjunctiva/sclera: Conjunctivae normal.  Cardiovascular:     Rate and Rhythm: Normal rate and regular rhythm.     Heart sounds: Normal heart sounds. No murmur heard. Pulmonary:     Effort: Pulmonary effort is normal.     Breath sounds: Normal breath sounds.     Comments: Lungs clear to auscultation Musculoskeletal:        General: Normal range of motion.      Cervical back: Normal range of motion and neck supple.  Skin:    General: Skin is warm and dry.  Neurological:     Mental Status: She is alert and oriented to person, place, and time.  Psychiatric:        Mood and Affect: Mood normal.        Behavior: Behavior normal.        Thought Content: Thought content normal.        Judgment: Judgment normal.     Diagnostics: FVC 3.21 which is 92% of predicted value, FEV1 2.61 which is 90% of predicted value.  Spirometry indicates normal ventilatory function.  Assessment and Plan: 1. Mild intermittent asthma without complication   2. Seasonal and perennial allergic rhinitis   3. Seasonal allergic conjunctivitis     Meds ordered this encounter  Medications   albuterol (VENTOLIN HFA) 108 (90 Base) MCG/ACT inhaler    Sig: Inhale 2 puffs into the lungs every 4 (four) hours as needed for wheezing or shortness of breath.    Dispense:  1 each    Refill:  3   montelukast (SINGULAIR) 10 MG tablet    Sig: TAKE 1 TABLET(10 MG) BY MOUTH AT BEDTIME. NEED APPOINTMENT FOR ADDITIONAL REFILLS.    Dispense:  90 tablet    Refill:  1   Short Ragweed Pollen Ext (RAGWITEK) 12 AMB A 1-U SUBL    Sig: Place 1 tablet under the tongue daily. Bring this prescription to your appointment and you will take the first SL tablet in the clinic.    Dispense:  30 tablet    Refill:  5    Patient Instructions  Asthma Continue montelukast 10 mg once a day to prevent cough or wheeze Continue albuterol 2 puffs once every 4 hours as needed for cough or wheeze Continue albuterol 2 puffs 5 to 15 minutes before activity to decrease cough or wheeze  Allergic rhinitis Continue Ragwitek 1 sublingual tablet once a day and have access to an epinephrine auto injector set per protocol. Take this medication until the first frost-usually in about November  Continue allergen avoidance measures directed toward grass pollen, weed pollen, ragweed pollen, tree pollen, and mold as  listed below Continue Xyzal 5 mg once a day as needed for runny nose or itch. Remember to rotate to a different antihistamine about every 3 months. Some examples of over the counter antihistamines include Zyrtec (cetirizine), Xyzal (levocetirizine), Allegra (fexofenadine), and Claritin (loratidine).  Continue Qnasl 2 sprays in each nostril once a day as needed for a stuffy nose For nasal congestion, continue Mucinex 1200 mg twice a day and increase fluid intake to thin mucus Continue saline nasal rinses as needed for nasal symptoms. Use this before any medicated nasal sprays for best result  Allergic conjunctivitis Some over the counter eye drops include Pataday one drop in each eye once a day as needed for red, itchy eyes OR Zaditor one drop in each eye twice a  day as needed for red itchy eyes. Avoid eye drops that say red eye relief as they may contain medications that dry out your eyes.   Call the clinic if this treatment plan is not working well for you.  Follow up in 6 months or sooner if needed.   Return in about 6 months (around 10/16/2023), or if symptoms worsen or fail to improve.    Thank you for the opportunity to care for this patient.  Please do not hesitate to contact me with questions.  Thermon Leyland, FNP [facility]

## 2023-05-15 DIAGNOSIS — F900 Attention-deficit hyperactivity disorder, predominantly inattentive type: Secondary | ICD-10-CM | POA: Diagnosis not present

## 2023-05-15 DIAGNOSIS — F411 Generalized anxiety disorder: Secondary | ICD-10-CM | POA: Diagnosis not present

## 2023-05-15 DIAGNOSIS — M797 Fibromyalgia: Secondary | ICD-10-CM | POA: Diagnosis not present

## 2023-07-10 DIAGNOSIS — E559 Vitamin D deficiency, unspecified: Secondary | ICD-10-CM | POA: Diagnosis not present

## 2023-07-10 DIAGNOSIS — E538 Deficiency of other specified B group vitamins: Secondary | ICD-10-CM | POA: Diagnosis not present

## 2023-07-10 DIAGNOSIS — F909 Attention-deficit hyperactivity disorder, unspecified type: Secondary | ICD-10-CM | POA: Diagnosis not present

## 2023-07-10 DIAGNOSIS — M797 Fibromyalgia: Secondary | ICD-10-CM | POA: Diagnosis not present

## 2023-07-11 DIAGNOSIS — G4700 Insomnia, unspecified: Secondary | ICD-10-CM | POA: Diagnosis not present

## 2023-07-11 DIAGNOSIS — F909 Attention-deficit hyperactivity disorder, unspecified type: Secondary | ICD-10-CM | POA: Diagnosis not present

## 2023-07-11 DIAGNOSIS — M545 Low back pain, unspecified: Secondary | ICD-10-CM | POA: Diagnosis not present

## 2023-07-11 DIAGNOSIS — M797 Fibromyalgia: Secondary | ICD-10-CM | POA: Diagnosis not present

## 2023-07-11 DIAGNOSIS — E538 Deficiency of other specified B group vitamins: Secondary | ICD-10-CM | POA: Diagnosis not present

## 2023-08-06 DIAGNOSIS — M47896 Other spondylosis, lumbar region: Secondary | ICD-10-CM | POA: Diagnosis not present

## 2023-08-06 DIAGNOSIS — M51369 Other intervertebral disc degeneration, lumbar region without mention of lumbar back pain or lower extremity pain: Secondary | ICD-10-CM | POA: Diagnosis not present

## 2023-08-06 DIAGNOSIS — M47816 Spondylosis without myelopathy or radiculopathy, lumbar region: Secondary | ICD-10-CM | POA: Insufficient documentation

## 2023-08-06 DIAGNOSIS — R296 Repeated falls: Secondary | ICD-10-CM | POA: Diagnosis not present

## 2023-08-06 DIAGNOSIS — M5030 Other cervical disc degeneration, unspecified cervical region: Secondary | ICD-10-CM | POA: Diagnosis not present

## 2023-08-15 DIAGNOSIS — F909 Attention-deficit hyperactivity disorder, unspecified type: Secondary | ICD-10-CM | POA: Diagnosis not present

## 2023-08-15 DIAGNOSIS — M797 Fibromyalgia: Secondary | ICD-10-CM | POA: Diagnosis not present

## 2023-08-22 DIAGNOSIS — M47816 Spondylosis without myelopathy or radiculopathy, lumbar region: Secondary | ICD-10-CM | POA: Diagnosis not present

## 2023-09-05 DIAGNOSIS — R110 Nausea: Secondary | ICD-10-CM | POA: Diagnosis not present

## 2023-09-05 DIAGNOSIS — F900 Attention-deficit hyperactivity disorder, predominantly inattentive type: Secondary | ICD-10-CM | POA: Diagnosis not present

## 2023-09-05 DIAGNOSIS — F419 Anxiety disorder, unspecified: Secondary | ICD-10-CM | POA: Diagnosis not present

## 2023-09-13 DIAGNOSIS — M2560 Stiffness of unspecified joint, not elsewhere classified: Secondary | ICD-10-CM | POA: Insufficient documentation

## 2023-10-01 DIAGNOSIS — M2560 Stiffness of unspecified joint, not elsewhere classified: Secondary | ICD-10-CM | POA: Diagnosis not present

## 2023-10-01 DIAGNOSIS — M545 Low back pain, unspecified: Secondary | ICD-10-CM | POA: Diagnosis not present

## 2023-10-09 ENCOUNTER — Other Ambulatory Visit (HOSPITAL_BASED_OUTPATIENT_CLINIC_OR_DEPARTMENT_OTHER): Payer: Self-pay

## 2023-10-09 MED ORDER — ZEPBOUND 5 MG/0.5ML ~~LOC~~ SOAJ
5.0000 mg | SUBCUTANEOUS | 0 refills | Status: DC
  Filled 2023-10-09 – 2023-10-28 (×2): qty 2, 28d supply, fill #0
  Filled 2023-11-01: qty 6, 84d supply, fill #0

## 2023-10-19 ENCOUNTER — Other Ambulatory Visit (HOSPITAL_BASED_OUTPATIENT_CLINIC_OR_DEPARTMENT_OTHER): Payer: Self-pay

## 2023-10-21 ENCOUNTER — Encounter: Payer: Self-pay | Admitting: Family Medicine

## 2023-10-21 MED ORDER — LEVOCETIRIZINE DIHYDROCHLORIDE 5 MG PO TABS: ORAL_TABLET | ORAL | 1 refills | Status: DC

## 2023-10-21 MED ORDER — FEXOFENADINE HCL 180 MG PO TABS: 180.0000 mg | ORAL_TABLET | Freq: Every day | ORAL | 1 refills | Status: DC

## 2023-10-21 MED ORDER — QNASL 80 MCG/ACT NA AERS: 2.0000 | INHALATION_SPRAY | Freq: Every day | NASAL | 5 refills | Status: DC | PRN

## 2023-10-21 NOTE — Telephone Encounter (Signed)
 Can you please order both antihistamines for 90 days as well as Qnasl. Thank you

## 2023-10-22 ENCOUNTER — Other Ambulatory Visit (HOSPITAL_BASED_OUTPATIENT_CLINIC_OR_DEPARTMENT_OTHER): Payer: Self-pay

## 2023-10-22 MED ORDER — SEMAGLUTIDE-WEIGHT MANAGEMENT 1 MG/0.5ML ~~LOC~~ SOAJ
1.0000 mg | SUBCUTANEOUS | 0 refills | Status: DC
Start: 2023-10-21 — End: 2024-06-11
  Filled 2023-10-22 (×2): qty 2, 28d supply, fill #0

## 2023-10-22 NOTE — Progress Notes (Unsigned)
   Rubin Payor, PhD, LAT, ATC acting as a scribe for [name], MD.  Cynthia [name] is a 38 y.o. female who presents to [facility] today for for evaluation of her hypermobility.   Pertinent review of systems: ***  Relevant historical information: ***   Exam:  There were no vitals taken for this visit. General: Well Developed, well nourished, and in no acute distress.   MSK: ***    Lab and Radiology Results No results found for this or any previous visit (from the past 72 hours). No results found.     Assessment and Plan: 38 y.o. female with ***   PDMP not reviewed this encounter. No orders of the defined types were placed in this encounter.  No orders of the defined types were placed in this encounter.    Discussed warning signs or symptoms. Please see discharge instructions. Patient expresses understanding.   ***

## 2023-10-23 ENCOUNTER — Encounter: Payer: Self-pay | Admitting: Family Medicine

## 2023-10-23 ENCOUNTER — Ambulatory Visit: Admitting: Family Medicine

## 2023-10-23 VITALS — BP 110/78 | HR 85 | Ht 62.0 in | Wt 258.0 lb

## 2023-10-23 DIAGNOSIS — M2550 Pain in unspecified joint: Secondary | ICD-10-CM | POA: Diagnosis not present

## 2023-10-23 DIAGNOSIS — R002 Palpitations: Secondary | ICD-10-CM | POA: Diagnosis not present

## 2023-10-23 DIAGNOSIS — Q796 Ehlers-Danlos syndrome, unspecified: Secondary | ICD-10-CM

## 2023-10-23 LAB — SEDIMENTATION RATE: Sed Rate: 17 mm/h (ref 0–20)

## 2023-10-23 NOTE — Patient Instructions (Addendum)
 Thank you for coming in today.   Please get labs today before you leave   [facility] will reach out to you about scheduling the echocardiogram.

## 2023-10-25 ENCOUNTER — Other Ambulatory Visit: Payer: Self-pay | Admitting: Family Medicine

## 2023-10-25 LAB — CYCLIC CITRUL PEPTIDE ANTIBODY, IGG: Cyclic Citrullin Peptide Ab: <16 U

## 2023-10-25 LAB — ANA: Anti Nuclear Antibody (ANA): NEGATIVE

## 2023-10-25 LAB — HLA-B27 ANTIGEN: HLA-B27 Antigen: NEGATIVE

## 2023-10-25 LAB — RHEUMATOID FACTOR: Rheumatoid fact SerPl-aCnc: <10 [IU]/mL (ref <14)

## 2023-10-28 ENCOUNTER — Encounter: Payer: Self-pay | Admitting: Family Medicine

## 2023-10-28 ENCOUNTER — Other Ambulatory Visit (HOSPITAL_BASED_OUTPATIENT_CLINIC_OR_DEPARTMENT_OTHER): Payer: Self-pay

## 2023-10-28 DIAGNOSIS — M545 Low back pain, unspecified: Secondary | ICD-10-CM | POA: Diagnosis not present

## 2023-10-28 NOTE — Progress Notes (Signed)
Rheumatologic workup was negative.

## 2023-10-29 ENCOUNTER — Other Ambulatory Visit (HOSPITAL_BASED_OUTPATIENT_CLINIC_OR_DEPARTMENT_OTHER): Payer: Self-pay

## 2023-10-29 ENCOUNTER — Encounter: Admitting: Obstetrics and Gynecology

## 2023-10-29 MED ORDER — ZEPBOUND 5 MG/0.5ML ~~LOC~~ SOLN
5.0000 mg | SUBCUTANEOUS | 0 refills | Status: DC
Start: 2023-10-29 — End: 2024-06-11

## 2023-10-29 NOTE — Progress Notes (Deleted)
 GYNECOLOGY  VISIT   HPI: 38 y.o.   Single  Caucasian female   G0P0 with No LMP recorded. (Menstrual status: Oral contraceptives).   here for: NEW GYN/ consult for anxiety   GYNECOLOGIC HISTORY: No LMP recorded. (Menstrual status: Oral contraceptives). Contraception:  OCP Menopausal hormone therapy:  n/a Last 2 paps:  08/04/15 neg History of abnormal Pap or positive HPV:  {YES NO:22349} Mammogram:  n/a        OB History     Gravida  0   Para      Term      Preterm      AB      Living         SAB      IAB      Ectopic      Multiple      Live Births                 Patient Active Problem List   Diagnosis Date Noted   Mild intermittent asthma without complication 04/18/2023   Nephrolithiasis 02/13/2023   Acute non-recurrent maxillary sinusitis 09/13/2022   Increased heart rate 10/11/2021   Binge eating disorder 10/11/2021   Impaired fasting glucose 10/11/2021   GAD (generalized anxiety disorder) 10/11/2021   Attention deficit disorder (ADD) without hyperactivity 09/17/2019   Primary osteoarthritis of both knees 08/25/2019   Anxiety 06/04/2019   DDD (degenerative disc disease), cervical 06/03/2018   Degeneration of lumbar intervertebral disc 06/03/2018   Seasonal and perennial allergic rhinitis 05/05/2018   Seasonal allergic conjunctivitis 05/05/2018   Moderate persistent asthma, uncomplicated 05/05/2018   S/P arthroscopy of knee 04/11/2018   Prediabetes 06/11/2017   Vitamin D deficiency 06/11/2017   Class 3 severe obesity with serious comorbidity and body mass index (BMI) of 45.0 to 49.9 in adult (HCC) 01/09/2012   Asthma     Past Medical History:  Diagnosis Date   ADHD (attention deficit hyperactivity disorder)    Allergic rhinitis    Anxiety    Asthma    Back injury    COMPETITIVE INJURY IN HIGH SCHOOL   Back pain    Depression    Fatty liver    Fibromyalgia    Gallbladder problem    GERD (gastroesophageal reflux disease)    IBS  (irritable bowel syndrome)    Insulin resistance    Joint pain    Migraines    OA (osteoarthritis) of knee    Obesity    Pneumonia    PONV (postoperative nausea and vomiting)    Seasonal allergies    Vitamin D deficiency     Past Surgical History:  Procedure Laterality Date   CHOLECYSTECTOMY     DG DILATION URETERS     KNEE ARTHROSCOPY Left    KNEE ARTHROSCOPY WITH LATERAL MENISECTOMY Right 04/11/2018   Procedure: RIGHT KNEE ARTHROSCOPY WITH PARTIAL MENISECTOMY CHONDROPLASTY;  Surgeon: Eugenia Mcalpine, MD;  Location: [facility];  Service: Orthopedics;  Laterality: Right;   MOUTH SURGERY     SHOULDER SURGERY  2002   RIGHT   WISDOM TOOTH EXTRACTION     WRIST ARTHROSCOPY     Right    Current Outpatient Medications  Medication Sig Dispense Refill   albuterol (VENTOLIN HFA) 108 (90 Base) MCG/ACT inhaler Inhale 2 puffs into the lungs every 4 (four) hours as needed for wheezing or shortness of breath. 1 each 3   Beclomethasone Dipropionate (QNASL) 80 MCG/ACT AERS Place 2 Pump into the nose daily as needed.  10.6 g 5   COVID-19 mRNA bivalent vaccine, Pfizer, (PFIZER COVID-19 VAC BIVALENT) injection Inject into the muscle. 0.3 mL 0   cyanocobalamin (VITAMIN B12) 1000 MCG tablet Take by mouth.     EPINEPHrine 0.3 mg/0.3 mL IJ SOAJ injection Inject 0.3 mg into the muscle as needed for anaphylaxis. 1 each 2   fexofenadine (ALLEGRA) 180 MG tablet Take 1 tablet (180 mg total) by mouth daily. 90 tablet 1   FLUoxetine (PROZAC) 40 MG capsule Take 1 capsule (40 mg total) by mouth daily. 90 capsule 0   gabapentin (NEURONTIN) 300 MG capsule TAKE 1 CAPSULE BY MOUTH EVERY MORNING, 1 CAPSULE IN THE EVENING, AND 2 CAPSULES AT BEDTIME//Please call and make overdue appt for further refills 120 capsule 0   hyoscyamine (LEVSIN SL) 0.125 MG SL tablet PLACE 1 TABLET UNDER THE TONGUE PRIOR TO EACH MEAL AND AT BEDTIME AS NEEDED     levocetirizine (XYZAL) 5 MG tablet TAKE 1 TABLET(5 MG) BY MOUTH EVERY EVENING 90  tablet 1   Lisdexamfetamine Dimesylate (VYVANSE) 40 MG CHEW Chew 40 mg by mouth daily. 30 tablet 0   Lisdexamfetamine Dimesylate (VYVANSE) 40 MG CHEW Chew 40 mg by mouth daily at 12 noon. 30 tablet 0   methocarbamol (ROBAXIN) 500 MG tablet Take 1 tablet (500 mg total) by mouth 2 (two) times daily as needed for muscle spasms. 60 tablet 0   montelukast (SINGULAIR) 10 MG tablet TAKE 1 TABLET(10 MG) BY MOUTH AT BEDTIME. NEED APPOINTMENT FOR ADDITIONAL REFILLS. 90 tablet 1   norethindrone-ethinyl estradiol (LOESTRIN) 1-20 MG-MCG tablet Take 1 tablet by mouth daily.     omeprazole (PRILOSEC) 20 MG capsule 1 capsule 30 minutes before morning meal     ondansetron (ZOFRAN ODT) 4 MG disintegrating tablet Take 1 tablet (4 mg total) by mouth every 8 (eight) hours as needed for nausea or vomiting. 20 tablet 0   Semaglutide-Weight Management 1 MG/0.5ML SOAJ Inject 1 mg into the skin once a week. 6 mL 0   Short Ragweed Pollen Ext (RAGWITEK) 12 AMB A 1-U SUBL Place 1 tablet under the tongue daily. Bring this prescription to your appointment and you will take the first SL tablet in the clinic. 30 tablet 5   tirzepatide (ZEPBOUND) 10 MG/0.5ML Pen Inject 10 mg into the skin once a week. 2 mL 0   tirzepatide (ZEPBOUND) 12.5 MG/0.5ML Pen Inject 12.5 mg into the skin every 7 (seven) days. 2 mL 0   tirzepatide (ZEPBOUND) 15 MG/0.5ML Pen Inject 15 mg into the skin once a week. 2 mL 0   tirzepatide (ZEPBOUND) 5 MG/0.5ML injection vial Inject 5 mg into the skin once a week. 6 mL 0   tirzepatide (ZEPBOUND) 5 MG/0.5ML Pen Inject 5 mg into the skin once a week. 6 mL 0   traZODone (DESYREL) 100 MG tablet Take 100 mg by mouth at bedtime.     Vitamin D, Ergocalciferol, (DRISDOL) 1.25 MG (50000 UNIT) CAPS capsule Take 1 capsule (50,000 Units total) by mouth every 7 (seven) days. 4 capsule 0   No current facility-administered medications for this visit.     ALLERGIES: Aquacel [carboxymethylcellulose], Penicillins, Adhesive  [tape], Latex, and Oxycodone  Family History  Problem Relation Age of Onset   Multiple sclerosis Mother    Diabetes Mother    Hyperlipidemia Mother    Thyroid disease Mother    Depression Mother    Anxiety disorder Mother    Obesity Mother    Allergic rhinitis Mother  Hypertension Father    Diabetes Father    Hyperlipidemia Father    Obesity Father    Allergic rhinitis Father    Diabetes Maternal Grandfather    Cancer Maternal Grandfather        prostate   Heart disease Maternal Grandfather    Heart disease Paternal Grandfather    Allergic rhinitis Paternal Aunt    Allergic rhinitis Maternal Grandmother    Asthma Neg Hx    Eczema Neg Hx    Urticaria Neg Hx     Social History   Socioeconomic History   Marital status: Single    Spouse name: Not on file   Number of children: 0   Years of education: some college   Highest education level: Not on file  Occupational History   Occupation: not working d/t knees  Tobacco Use   Smoking status: Never   Smokeless tobacco: Never  Vaping Use   Vaping status: Never Used  Substance and Sexual Activity   Alcohol use: Yes    Comment: socially 2-3 drinks per month   Drug use: No   Sexual activity: Not Currently    Birth control/protection: Pill  Other Topics Concern   Not on file  Social History Narrative   Lives with parents   Caffeine use: 2-3 times per week   Right handed   Social Drivers of Corporate investment banker Strain: Not on file  Food Insecurity: No Food Insecurity (05/28/2022)   Received from [facility], [facility]   Hunger Vital Sign    Worried About Running Out of Food in the Last Year: Never true    Ran Out of Food in the Last Year: Never true  Transportation Needs: Not on file  Physical Activity: Not on file  Stress: Not on file  Social Connections: Unknown (05/15/2022)   Received from [facility], [facility]   Social Network    Social Network: Not on file  Intimate Partner Violence:  Unknown (05/15/2022)   Received from [facility], [facility]   HITS    Physically Hurt: Not on file    Insult or Talk Down To: Not on file    Threaten Physical Harm: Not on file    Scream or Curse: Not on file    Review of Systems  PHYSICAL EXAMINATION:   There were no vitals taken for this visit.    General appearance: alert, cooperative and appears stated age Head: Normocephalic, without obvious abnormality, atraumatic Neck: no adenopathy, supple, symmetrical, trachea midline and thyroid normal to inspection and palpation Lungs: clear to auscultation bilaterally Breasts: normal appearance, no masses or tenderness, No nipple retraction or dimpling, No nipple discharge or bleeding, No axillary or supraclavicular adenopathy Heart: regular rate and rhythm Abdomen: soft, non-tender, no masses,  no organomegaly Extremities: extremities normal, atraumatic, no cyanosis or edema Skin: Skin color, texture, turgor normal. No rashes or lesions Lymph nodes: Cervical, supraclavicular, and axillary nodes normal. No abnormal inguinal nodes palpated Neurologic: Grossly normal  Pelvic: External genitalia:  no lesions              Urethra:  normal appearing urethra with no masses, tenderness or lesions              Bartholins and Skenes: normal                 Vagina: normal appearing vagina with normal color and discharge, no lesions  Cervix: no lesions                Bimanual Exam:  Uterus:  normal size, contour, position, consistency, mobility, non-tender              Adnexa: no mass, fullness, tenderness              Rectal exam: {yes no:314532}.  Confirms.              Anus:  normal sphincter tone, no lesions  Chaperone was present for exam:  {BSCHAPERONE:31226::"Lakeitha Basques F, CMA"}  ASSESSMENT:    PLAN:    {LABS (Optional):23779}  ***  total time was spent for this patient encounter, including preparation, face-to-face counseling with the patient, coordination of  care, and documentation of the encounter.

## 2023-10-29 NOTE — Progress Notes (Unsigned)
 Cynthia [name] D.[facility] [address] [place] [address] Phone: [phone]   Assessment and Plan:     There are no diagnoses linked to this encounter.  *** - Patient has received relief with OMT in the past.  Elects for repeat OMT today.  Tolerated well per note below. - Decision today to treat with OMT was based on Physical Exam   After verbal consent patient was treated with HVLA (high velocity low amplitude), ME (muscle energy), FPR (flex positional release), ST (soft tissue), PC/PD (Pelvic Compression/ Pelvic Decompression) techniques in cervical, rib, thoracic, lumbar, and pelvic areas. Patient tolerated the procedure well with improvement in symptoms.  Patient educated on potential side effects of soreness and recommended to rest, hydrate, and use Tylenol as needed for pain control.   Pertinent previous records reviewed include ***    Follow Up: ***     Subjective:   I, Cynthia [name], am serving as a Neurosurgeon for Doctor Richardean Sale  Chief Complaint: OMT  HPI:   10/30/2023 Patient is a 38 year old female with EDS. Patient states   Relevant Historical Information: ***  Additional pertinent review of systems negative.  Current Outpatient Medications  Medication Sig Dispense Refill   albuterol (VENTOLIN HFA) 108 (90 Base) MCG/ACT inhaler Inhale 2 puffs into the lungs every 4 (four) hours as needed for wheezing or shortness of breath. 1 each 3   Beclomethasone Dipropionate (QNASL) 80 MCG/ACT AERS Place 2 Pump into the nose daily as needed. 10.6 g 5   COVID-19 mRNA bivalent vaccine, Pfizer, (PFIZER COVID-19 VAC BIVALENT) injection Inject into the muscle. 0.3 mL 0   cyanocobalamin (VITAMIN B12) 1000 MCG tablet Take by mouth.     EPINEPHrine 0.3 mg/0.3 mL IJ SOAJ injection Inject 0.3 mg into the muscle as needed for anaphylaxis. 1 each 2   fexofenadine (ALLEGRA) 180 MG tablet Take 1 tablet (180 mg total) by mouth daily. 90 tablet 1    FLUoxetine (PROZAC) 40 MG capsule Take 1 capsule (40 mg total) by mouth daily. 90 capsule 0   gabapentin (NEURONTIN) 300 MG capsule TAKE 1 CAPSULE BY MOUTH EVERY MORNING, 1 CAPSULE IN THE EVENING, AND 2 CAPSULES AT BEDTIME//Please call and make overdue appt for further refills 120 capsule 0   hyoscyamine (LEVSIN SL) 0.125 MG SL tablet PLACE 1 TABLET UNDER THE TONGUE PRIOR TO EACH MEAL AND AT BEDTIME AS NEEDED     levocetirizine (XYZAL) 5 MG tablet TAKE 1 TABLET(5 MG) BY MOUTH EVERY EVENING 90 tablet 1   Lisdexamfetamine Dimesylate (VYVANSE) 40 MG CHEW Chew 40 mg by mouth daily. 30 tablet 0   Lisdexamfetamine Dimesylate (VYVANSE) 40 MG CHEW Chew 40 mg by mouth daily at 12 noon. 30 tablet 0   methocarbamol (ROBAXIN) 500 MG tablet Take 1 tablet (500 mg total) by mouth 2 (two) times daily as needed for muscle spasms. 60 tablet 0   montelukast (SINGULAIR) 10 MG tablet TAKE 1 TABLET(10 MG) BY MOUTH AT BEDTIME. NEED APPOINTMENT FOR ADDITIONAL REFILLS. 90 tablet 1   norethindrone-ethinyl estradiol (LOESTRIN) 1-20 MG-MCG tablet Take 1 tablet by mouth daily.     omeprazole (PRILOSEC) 20 MG capsule 1 capsule 30 minutes before morning meal     ondansetron (ZOFRAN ODT) 4 MG disintegrating tablet Take 1 tablet (4 mg total) by mouth every 8 (eight) hours as needed for nausea or vomiting. 20 tablet 0   Semaglutide-Weight Management 1 MG/0.5ML SOAJ Inject 1 mg into the skin once  a week. 6 mL 0   Short Ragweed Pollen Ext (RAGWITEK) 12 AMB A 1-U SUBL Place 1 tablet under the tongue daily. Bring this prescription to your appointment and you will take the first SL tablet in the clinic. 30 tablet 5   tirzepatide (ZEPBOUND) 10 MG/0.5ML Pen Inject 10 mg into the skin once a week. 2 mL 0   tirzepatide (ZEPBOUND) 12.5 MG/0.5ML Pen Inject 12.5 mg into the skin every 7 (seven) days. 2 mL 0   tirzepatide (ZEPBOUND) 15 MG/0.5ML Pen Inject 15 mg into the skin once a week. 2 mL 0   tirzepatide (ZEPBOUND) 5 MG/0.5ML Pen Inject 5 mg  into the skin once a week. 6 mL 0   traZODone (DESYREL) 100 MG tablet Take 100 mg by mouth at bedtime.     Vitamin D, Ergocalciferol, (DRISDOL) 1.25 MG (50000 UNIT) CAPS capsule Take 1 capsule (50,000 Units total) by mouth every 7 (seven) days. 4 capsule 0   No current facility-administered medications for this visit.      Objective:     There were no vitals filed for this visit.    There is no height or weight on file to calculate BMI.    Physical Exam:     General: Well-appearing, cooperative, sitting comfortably in no acute distress.   OMT Physical Exam:  ASIS Compression Test: Positive Right Cervical: TTP paraspinal, *** Rib: Bilateral elevated first rib with TTP Thoracic: TTP paraspinal,*** Lumbar: TTP paraspinal,*** Pelvis: Right anterior innominate  Electronically signed by:  Cynthia [name] D.[facility] 7:38 AM 10/29/23

## 2023-10-30 ENCOUNTER — Ambulatory Visit: Admitting: Sports Medicine

## 2023-10-30 VITALS — BP 120/84 | HR 114 | Ht 62.0 in | Wt 258.0 lb

## 2023-10-30 DIAGNOSIS — G8929 Other chronic pain: Secondary | ICD-10-CM

## 2023-10-30 DIAGNOSIS — M546 Pain in thoracic spine: Secondary | ICD-10-CM | POA: Diagnosis not present

## 2023-10-30 DIAGNOSIS — M9902 Segmental and somatic dysfunction of thoracic region: Secondary | ICD-10-CM

## 2023-10-30 DIAGNOSIS — M542 Cervicalgia: Secondary | ICD-10-CM

## 2023-10-30 DIAGNOSIS — Q796 Ehlers-Danlos syndrome, unspecified: Secondary | ICD-10-CM | POA: Diagnosis not present

## 2023-10-30 DIAGNOSIS — M545 Low back pain, unspecified: Secondary | ICD-10-CM | POA: Diagnosis not present

## 2023-10-30 DIAGNOSIS — M2550 Pain in unspecified joint: Secondary | ICD-10-CM

## 2023-10-30 DIAGNOSIS — M9908 Segmental and somatic dysfunction of rib cage: Secondary | ICD-10-CM

## 2023-10-30 DIAGNOSIS — M9903 Segmental and somatic dysfunction of lumbar region: Secondary | ICD-10-CM

## 2023-10-30 DIAGNOSIS — M9901 Segmental and somatic dysfunction of cervical region: Secondary | ICD-10-CM

## 2023-10-30 DIAGNOSIS — M9905 Segmental and somatic dysfunction of pelvic region: Secondary | ICD-10-CM

## 2023-10-30 NOTE — Progress Notes (Unsigned)
 GYNECOLOGY  VISIT   HPI: 38 y.o.   Single  Caucasian female   G0P0000 with Patient's last menstrual period was 10/16/2023 (exact date).   here for: NEW GYN/Possible ovarian cyst - Patient states she has had slight bloating and cramping in pelvic area, would like to discuss and possibly find out if she has any kind of ovarian cyst. Patient would also like to discuss getting mammogram. Patient just diagnosed with ehlers danlos syndrome last week, states her doctor has not specified which type yet. Patient is aware that she is overdue for annual exam would like to discuss that as well.   Has back pain following a fall.  Is waiting for MRI.  She is wanting to rule out ovarian cyst as a cause for her back pain.   Has some right upper abdominal discomfort.  Had cholecystectomy in past.   Hx renal stones.   Is taking Wegovy.    Taking birth control pills through her PCP. They control her heavy periods. Usually her cycles are well controlled.  She started the last pack late and her period was painful.   Not sexually active for many years.   Asking about her fertility for the future.   Former patient of this office. 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 Has a dog.   Takes care of her mother who has MS. She lives with her parents.   GYNECOLOGIC HISTORY: Patient's last menstrual period was 10/16/2023 (exact date). Contraception:  OCP Menopausal hormone therapy:  n/a Last 2 paps:  08/04/15 neg History of abnormal Pap or positive HPV:  no Mammogram:  n/a        OB History     Gravida  0   Para  0   Term  0   Preterm  0   AB  0   Living  0      SAB  0   IAB  0   Ectopic  0   Multiple  0   Live Births  0              Patient Active Problem List   Diagnosis Date Noted   Mild intermittent asthma without complication 04/18/2023   Nephrolithiasis 02/13/2023   Acute non-recurrent maxillary sinusitis 09/13/2022   Increased heart rate 10/11/2021   Binge  eating disorder 10/11/2021   Impaired fasting glucose 10/11/2021   GAD (generalized anxiety disorder) 10/11/2021   Attention deficit disorder (ADD) without hyperactivity 09/17/2019   Primary osteoarthritis of both knees 08/25/2019   Anxiety 06/04/2019   DDD (degenerative disc disease), cervical 06/03/2018   Degeneration of lumbar intervertebral disc 06/03/2018   Seasonal and perennial allergic rhinitis 05/05/2018   Seasonal allergic conjunctivitis 05/05/2018   Moderate persistent asthma, uncomplicated 05/05/2018   S/P arthroscopy of knee 04/11/2018   Prediabetes 06/11/2017   Vitamin D deficiency 06/11/2017   Class 3 severe obesity with serious comorbidity and body mass index (BMI) of 45.0 to 49.9 in adult [facility]) 01/09/2012   Asthma     Past Medical History:  Diagnosis Date   ADHD (attention deficit hyperactivity disorder)    Allergic rhinitis    Anxiety    Asthma    Back injury    COMPETITIVE INJURY IN HIGH SCHOOL   Back pain    Depression    Ehlers-Danlos syndrome    Fatty liver    Fibromyalgia    Gallbladder problem    GERD (gastroesophageal reflux disease)    IBS (irritable bowel syndrome)  Insulin resistance    Joint pain    Kidney stones    Migraines    OA (osteoarthritis) of knee    Obesity    Pneumonia    PONV (postoperative nausea and vomiting)    Seasonal allergies    Vitamin D deficiency     Past Surgical History:  Procedure Laterality Date   CHOLECYSTECTOMY     DG DILATION URETERS     KNEE ARTHROSCOPY Left    KNEE ARTHROSCOPY WITH LATERAL MENISECTOMY Right 04/11/2018   Procedure: RIGHT KNEE ARTHROSCOPY WITH PARTIAL MENISECTOMY CHONDROPLASTY;  Surgeon: Eugenia Mcalpine, MD;  Location: [facility];  Service: Orthopedics;  Laterality: Right;   MOUTH SURGERY     SHOULDER SURGERY  2002   RIGHT   WISDOM TOOTH EXTRACTION     WRIST ARTHROSCOPY     Right    Current Outpatient Medications  Medication Sig Dispense Refill   albuterol (VENTOLIN HFA) 108 (90  Base) MCG/ACT inhaler Inhale 2 puffs into the lungs every 4 (four) hours as needed for wheezing or shortness of breath. 1 each 3   Beclomethasone Dipropionate (QNASL) 80 MCG/ACT AERS Place 2 Pump into the nose daily as needed. 10.6 g 5   BLISOVI FE 1/20 1-20 MG-MCG tablet Take 1 tablet by mouth daily.     COVID-19 mRNA bivalent vaccine, Pfizer, (PFIZER COVID-19 VAC BIVALENT) injection Inject into the muscle. 0.3 mL 0   cyanocobalamin (VITAMIN B12) 1000 MCG tablet Take by mouth.     cyclobenzaprine (FLEXERIL) 10 MG tablet TAKE 1 TABLET BY MOUTH THREE TIMES DAILY FOR 15 DAYS AS NEEDED FOR MUSCLE SPASMS     EPINEPHrine 0.3 mg/0.3 mL IJ SOAJ injection Inject 0.3 mg into the muscle as needed for anaphylaxis. 1 each 2   fexofenadine (ALLEGRA) 180 MG tablet Take 1 tablet (180 mg total) by mouth daily. 90 tablet 1   FLUoxetine (PROZAC) 40 MG capsule Take 1 capsule (40 mg total) by mouth daily. 90 capsule 0   gabapentin (NEURONTIN) 300 MG capsule TAKE 1 CAPSULE BY MOUTH EVERY MORNING, 1 CAPSULE IN THE EVENING, AND 2 CAPSULES AT BEDTIME//Please call and make overdue appt for further refills 120 capsule 0   HYDROcodone-acetaminophen (NORCO/VICODIN) 5-325 MG tablet Take 1 tablet by mouth every 6 (six) hours as needed.     hyoscyamine (LEVSIN SL) 0.125 MG SL tablet PLACE 1 TABLET UNDER THE TONGUE PRIOR TO EACH MEAL AND AT BEDTIME AS NEEDED     levocetirizine (XYZAL) 5 MG tablet TAKE 1 TABLET(5 MG) BY MOUTH EVERY EVENING 90 tablet 1   Lisdexamfetamine Dimesylate (VYVANSE) 40 MG CHEW Chew 40 mg by mouth daily. 30 tablet 0   Lisdexamfetamine Dimesylate (VYVANSE) 40 MG CHEW Chew 40 mg by mouth daily at 12 noon. 30 tablet 0   methocarbamol (ROBAXIN) 500 MG tablet Take 1 tablet (500 mg total) by mouth 2 (two) times daily as needed for muscle spasms. 60 tablet 0   montelukast (SINGULAIR) 10 MG tablet TAKE 1 TABLET(10 MG) BY MOUTH AT BEDTIME. NEED APPOINTMENT FOR ADDITIONAL REFILLS. 90 tablet 1   norethindrone-ethinyl  estradiol (LOESTRIN) 1-20 MG-MCG tablet Take 1 tablet by mouth daily.     omeprazole (PRILOSEC) 20 MG capsule 1 capsule 30 minutes before morning meal     ondansetron (ZOFRAN ODT) 4 MG disintegrating tablet Take 1 tablet (4 mg total) by mouth every 8 (eight) hours as needed for nausea or vomiting. 20 tablet 0   Semaglutide-Weight Management 1 MG/0.5ML SOAJ Inject 1 mg into  the skin once a week. 6 mL 0   Short Ragweed Pollen Ext (RAGWITEK) 12 AMB A 1-U SUBL Place 1 tablet under the tongue daily. Bring this prescription to your appointment and you will take the first SL tablet in the clinic. 30 tablet 5   SYRINGE-NEEDLE, DISP, 3 ML (EASYPOINT NEEDLE/SYRINGE) 25G X 5/8" 3 ML MISC Use one needle/syringe to inject Vitamin B12 into the muscle once every 7 days. *Disp. whatever insurance will cover*     tirzepatide (ZEPBOUND) 10 MG/0.5ML Pen Inject 10 mg into the skin once a week. 2 mL 0   tirzepatide (ZEPBOUND) 12.5 MG/0.5ML Pen Inject 12.5 mg into the skin every 7 (seven) days. 2 mL 0   tirzepatide (ZEPBOUND) 15 MG/0.5ML Pen Inject 15 mg into the skin once a week. 2 mL 0   tirzepatide (ZEPBOUND) 5 MG/0.5ML injection vial Inject 5 mg into the skin once a week. 6 mL 0   tirzepatide (ZEPBOUND) 5 MG/0.5ML Pen Inject 5 mg into the skin once a week. 6 mL 0   traMADol (ULTRAM) 50 MG tablet TAKE 1 TABLET BY MOUTH THREE TIMES DAILY FOR 5 DAYS AS NEEDED     traZODone (DESYREL) 100 MG tablet Take 100 mg by mouth at bedtime.     Vitamin D, Ergocalciferol, (DRISDOL) 1.25 MG (50000 UNIT) CAPS capsule Take 1 capsule (50,000 Units total) by mouth every 7 (seven) days. 4 capsule 0   No current facility-administered medications for this visit.     ALLERGIES: Aquacel [carboxymethylcellulose], Penicillins, Adhesive [tape], Latex, and Oxycodone  Family History  Problem Relation Age of Onset   Multiple sclerosis Mother    Diabetes Mother    Hyperlipidemia Mother    Thyroid disease Mother    Depression Mother     Anxiety disorder Mother    Obesity Mother    Allergic rhinitis Mother    Hypertension Father    Diabetes Father    Hyperlipidemia Father    Obesity Father    Allergic rhinitis Father    Diabetes Maternal Grandfather    Cancer Maternal Grandfather        prostate   Heart disease Maternal Grandfather    Heart disease Paternal Grandfather    Allergic rhinitis Paternal Aunt    Allergic rhinitis Maternal Grandmother    Asthma Neg Hx    Eczema Neg Hx    Urticaria Neg Hx     Social History   Socioeconomic History   Marital status: Single    Spouse name: Not on file   Number of children: 0   Years of education: some college   Highest education level: Not on file  Occupational History   Occupation: not working d/t knees  Tobacco Use   Smoking status: Never   Smokeless tobacco: Never  Vaping Use   Vaping status: Never Used  Substance and Sexual Activity   Alcohol use: Yes    Comment: socially 2-3 drinks per month   Drug use: No   Sexual activity: Not Currently    Birth control/protection: Pill  Other Topics Concern   Not on file  Social History Narrative   Lives with parents   Caffeine use: 2-3 times per week   Right handed   Social Drivers of Health   Financial Resource Strain: Not on file  Food Insecurity: No Food Insecurity (05/28/2022)   Received from [facility], [facility]   Hunger Vital Sign    Worried About Running Out of Food in the Last Year: Never  true    Ran Out of Food in the Last Year: Never true  Transportation Needs: Not on file  Physical Activity: Not on file  Stress: Not on file  Social Connections: Unknown (05/15/2022)   Received from [facility], [facility]   Social Network    Social Network: Not on file  Intimate Partner Violence: Unknown (05/15/2022)   Received from [facility], [facility]   HITS    Physically Hurt: Not on file    Insult or Talk Down To: Not on file    Threaten Physical Harm: Not on file    Scream or  Curse: Not on file    Review of Systems  See HPI.   PHYSICAL EXAMINATION:   BP 114/82 (BP Location: Left Arm, Patient Position: Sitting, Cuff Size: Large)   Pulse (!) 122   Ht 5' 1.25" (1.556 m)   Wt 258 lb (117 kg)   LMP 10/16/2023 (Exact Date)   SpO2 96%   BMI 48.35 kg/m     General appearance: alert, cooperative and appears stated age Lungs: clear to auscultation bilaterally Heart: regular rate and rhythm Abdomen: soft, non-tender, no masses,  no organomegaly No abnormal inguinal nodes palpated Neurologic: Grossly normal  Pelvic: External genitalia:  no lesions              Urethra:  normal appearing urethra with no masses, tenderness or lesions              Bartholins and Skenes: normal                 Vagina: normal appearing vagina with normal color and discharge, no lesions              Cervix: not well seen.  Apical vaginal wall cyst.  Left vaginal wall apical polyp.  Speculum exam is tender.                  Bimanual Exam:  Uterus:  normal size, contour, position, consistency, mobility, non-tender              Adnexa: no mass, fullness, tenderness              Rectal exam: Yes.  .  Confirms.              Anus:  normal sphincter tone, no lesions  Chaperone was present for exam:  Warren Lacy, CMA  ASSESSMENT:  Back pain and pelvic pain.  Vaginal cyst vs. Nabothian cyst.  Vaginal wall polyp. Cervical cancer screening.  Hx renal stones.  On combined oral contraceptive pills. Health education/counseling.  Ehler Danlos.   Hx migraines.   PLAN:  Pap and HR HPV testing.  Urinalysis and reflex culture.  We discussed fertility evaluation.  No work up started as patient is on oral contraceptives.   Urinalysis and reflex culture. Return for pelvic ultrasound.   If pelvic US does not define her anatomy well, I would recommend a pelvic MRI. Screening mammogram at age 89 yo.  Questions invited and answered. Return also for annual exam.   45 min  total time was spent  for this patient encounter, including preparation, face-to-face counseling with the patient, coordination of care, and documentation of the encounter.

## 2023-10-30 NOTE — Patient Instructions (Signed)
 If you have not heard back from cardio by next week reach out to our office  Continue follow up with Dr. Denyse Amass  4 week follow up

## 2023-10-31 ENCOUNTER — Other Ambulatory Visit (HOSPITAL_COMMUNITY)
Admission: RE | Admit: 2023-10-31 | Discharge: 2023-10-31 | Disposition: A | Discharge status: Home / Self Care | Source: Ambulatory Visit | Attending: Obstetrics and Gynecology | Admitting: Obstetrics and Gynecology

## 2023-10-31 ENCOUNTER — Ambulatory Visit: Admitting: Obstetrics and Gynecology

## 2023-10-31 ENCOUNTER — Encounter: Payer: Self-pay | Admitting: Obstetrics and Gynecology

## 2023-10-31 VITALS — BP 114/82 | HR 122 | Ht 61.25 in | Wt 258.0 lb

## 2023-10-31 DIAGNOSIS — Z124 Encounter for screening for malignant neoplasm of cervix: Secondary | ICD-10-CM | POA: Insufficient documentation

## 2023-10-31 DIAGNOSIS — R102 Pelvic and perineal pain: Secondary | ICD-10-CM

## 2023-10-31 DIAGNOSIS — Z719 Counseling, unspecified: Secondary | ICD-10-CM | POA: Diagnosis not present

## 2023-10-31 DIAGNOSIS — N898 Other specified noninflammatory disorders of vagina: Secondary | ICD-10-CM

## 2023-10-31 LAB — URINALYSIS, COMPLETE W/RFL CULTURE
Bacteria, UA: NONE SEEN /HPF
Bilirubin Urine: NEGATIVE
Glucose, UA: NEGATIVE
Hgb urine dipstick: NEGATIVE
Hyaline Cast: NONE SEEN /LPF
Ketones, ur: NEGATIVE
Leukocyte Esterase: NEGATIVE
Nitrites, Initial: NEGATIVE
Protein, ur: NEGATIVE
RBC / HPF: NONE SEEN /HPF (ref 0–2)
Specific Gravity, Urine: 1.01 (ref 1.001–1.035)
WBC, UA: NONE SEEN /HPF (ref 0–5)
pH: 6 (ref 5.0–8.0)

## 2023-10-31 LAB — NO CULTURE INDICATED

## 2023-10-31 NOTE — Patient Instructions (Signed)
 You have a cyst noted with your vaginal exam today.  This could be a vaginal wall cyst, called a Gartner duct cyst, or it could be a nabothian cyst of your cervix.    There was also a polyp at the top left of your vagina.

## 2023-11-01 ENCOUNTER — Other Ambulatory Visit (HOSPITAL_BASED_OUTPATIENT_CLINIC_OR_DEPARTMENT_OTHER): Payer: Self-pay

## 2023-11-05 LAB — CYTOLOGY - PAP
Adequacy: ABSENT
Comment: NEGATIVE
Diagnosis: NEGATIVE
High risk HPV: NEGATIVE

## 2023-11-05 NOTE — Progress Notes (Signed)
 GYNECOLOGY  VISIT   HPI: 38 y.o.   Single  Caucasian female   G0P0000 with Patient's last menstrual period was 10/16/2023 (exact date).   here for: GYN scan due to pelvic pain and vaginal cyst.Patient states slight spotting and discomfort since pelvic exam.  Bleeding is now stopped.    Migraines.  Related to allergies and vertigo.   No aura.   GYNECOLOGIC HISTORY: Patient's last menstrual period was 10/16/2023 (exact date). Contraception: COCs Menopausal hormone therapy: n/a Last 2 paps: 08/04/2015-WNL, 10/31/2023- pap normal and neg HR HPV. History of abnormal Pap or positive HPV: no Mammogram: n/a        OB History     Gravida  0   Para  0   Term  0   Preterm  0   AB  0   Living  0      SAB  0   IAB  0   Ectopic  0   Multiple  0   Live Births  0              Patient Active Problem List   Diagnosis Date Noted   Mild intermittent asthma without complication 04/18/2023   Nephrolithiasis 02/13/2023   Acute non-recurrent maxillary sinusitis 09/13/2022   Increased heart rate 10/11/2021   Binge eating disorder 10/11/2021   Impaired fasting glucose 10/11/2021   GAD (generalized anxiety disorder) 10/11/2021   Attention deficit disorder (ADD) without hyperactivity 09/17/2019   Primary osteoarthritis of both knees 08/25/2019   Anxiety 06/04/2019   DDD (degenerative disc disease), cervical 06/03/2018   Degeneration of lumbar intervertebral disc 06/03/2018   Seasonal and perennial allergic rhinitis 05/05/2018   Seasonal allergic conjunctivitis 05/05/2018   Moderate persistent asthma, uncomplicated 05/05/2018   S/P arthroscopy of knee 04/11/2018   Prediabetes 06/11/2017   Vitamin D  deficiency 06/11/2017   Class 3 severe obesity with serious comorbidity and body mass index (BMI) of 45.0 to 49.9 in adult [facility]) 01/09/2012   Asthma     Past Medical History:  Diagnosis Date   ADHD (attention deficit hyperactivity disorder)    Allergic rhinitis    Anxiety     Asthma    Back injury    COMPETITIVE INJURY IN HIGH SCHOOL   Back pain    Depression    Ehlers-Danlos syndrome    Fatty liver    Fibromyalgia    Gallbladder problem    GERD (gastroesophageal reflux disease)    IBS (irritable bowel syndrome)    Insulin  resistance    Joint pain    Kidney stones    Migraines    no aura   OA (osteoarthritis) of knee    Obesity    Pneumonia    PONV (postoperative nausea and vomiting)    Seasonal allergies    Vitamin D  deficiency     Past Surgical History:  Procedure Laterality Date   CHOLECYSTECTOMY     DG DILATION URETERS     KNEE ARTHROSCOPY Left    KNEE ARTHROSCOPY WITH LATERAL MENISECTOMY Right 04/11/2018   Procedure: RIGHT KNEE ARTHROSCOPY WITH PARTIAL MENISECTOMY CHONDROPLASTY;  Surgeon: Genevie Kerns, MD;  Location: [facility];  Service: Orthopedics;  Laterality: Right;   MOUTH SURGERY     SHOULDER SURGERY  2002   RIGHT   WISDOM TOOTH EXTRACTION     WRIST ARTHROSCOPY     Right    Current Outpatient Medications  Medication Sig Dispense Refill   albuterol  (VENTOLIN  HFA) 108 (90 Base) MCG/ACT inhaler Inhale  2 puffs into the lungs every 4 (four) hours as needed for wheezing or shortness of breath. 1 each 3   Beclomethasone Dipropionate  (QNASL ) 80 MCG/ACT AERS Place 2 Pump into the nose daily as needed. 10.6 g 5   BLISOVI FE 1/20 1-20 MG-MCG tablet Take 1 tablet by mouth daily.     COVID-19 mRNA bivalent vaccine, Pfizer, (PFIZER COVID-19 VAC BIVALENT) injection Inject into the muscle. 0.3 mL 0   cyanocobalamin  (VITAMIN B12) 1000 MCG tablet Take by mouth.     cyclobenzaprine (FLEXERIL) 10 MG tablet TAKE 1 TABLET BY MOUTH THREE TIMES DAILY FOR 15 DAYS AS NEEDED FOR MUSCLE SPASMS     EPINEPHrine  0.3 mg/0.3 mL IJ SOAJ injection Inject 0.3 mg into the muscle as needed for anaphylaxis. 1 each 2   fexofenadine  (ALLEGRA ) 180 MG tablet Take 1 tablet (180 mg total) by mouth daily. 90 tablet 1   FLUoxetine  (PROZAC ) 40 MG capsule Take 1 capsule (40  mg total) by mouth daily. 90 capsule 0   gabapentin  (NEURONTIN ) 300 MG capsule TAKE 1 CAPSULE BY MOUTH EVERY MORNING, 1 CAPSULE IN THE EVENING, AND 2 CAPSULES AT BEDTIME//Please call and make overdue appt for further refills 120 capsule 0   HYDROcodone -acetaminophen  (NORCO/VICODIN) 5-325 MG tablet Take 1 tablet by mouth every 6 (six) hours as needed.     hyoscyamine (LEVSIN SL) 0.125 MG SL tablet PLACE 1 TABLET UNDER THE TONGUE PRIOR TO EACH MEAL AND AT BEDTIME AS NEEDED     levocetirizine (XYZAL ) 5 MG tablet TAKE 1 TABLET(5 MG) BY MOUTH EVERY EVENING 90 tablet 1   Lisdexamfetamine Dimesylate  (VYVANSE ) 40 MG CHEW Chew 40 mg by mouth daily. 30 tablet 0   Lisdexamfetamine Dimesylate  (VYVANSE ) 40 MG CHEW Chew 40 mg by mouth daily at 12 noon. 30 tablet 0   methocarbamol  (ROBAXIN ) 500 MG tablet Take 1 tablet (500 mg total) by mouth 2 (two) times daily as needed for muscle spasms. 60 tablet 0   montelukast  (SINGULAIR ) 10 MG tablet TAKE 1 TABLET(10 MG) BY MOUTH AT BEDTIME. NEED APPOINTMENT FOR ADDITIONAL REFILLS. 90 tablet 1   norethindrone -ethinyl estradiol (LOESTRIN) 1-20 MG-MCG tablet Take 1 tablet by mouth daily.     omeprazole (PRILOSEC) 20 MG capsule 1 capsule 30 minutes before morning meal     ondansetron  (ZOFRAN  ODT) 4 MG disintegrating tablet Take 1 tablet (4 mg total) by mouth every 8 (eight) hours as needed for nausea or vomiting. 20 tablet 0   Semaglutide -Weight Management 1 MG/0.5ML SOAJ Inject 1 mg into the skin once a week. 6 mL 0   Short Ragweed Pollen Ext (RAGWITEK) 12 AMB A 1-U SUBL Place 1 tablet under the tongue daily. Bring this prescription to your appointment and you will take the first SL tablet in the clinic. 30 tablet 5   SYRINGE-NEEDLE, DISP, 3 ML (EASYPOINT NEEDLE/SYRINGE) 25G X 5/8" 3 ML MISC Use one needle/syringe to inject Vitamin B12 into the muscle once every 7 days. *Disp. whatever insurance will cover*     tirzepatide  (ZEPBOUND ) 10 MG/0.5ML Pen Inject 10 mg into the skin  once a week. 2 mL 0   tirzepatide  (ZEPBOUND ) 12.5 MG/0.5ML Pen Inject 12.5 mg into the skin every 7 (seven) days. 2 mL 0   tirzepatide  (ZEPBOUND ) 15 MG/0.5ML Pen Inject 15 mg into the skin once a week. 2 mL 0   tirzepatide  (ZEPBOUND ) 5 MG/0.5ML injection vial Inject 5 mg into the skin once a week. 6 mL 0   tirzepatide  (ZEPBOUND ) 5 MG/0.5ML  Pen Inject 5 mg into the skin once a week. 6 mL 0   traMADol (ULTRAM) 50 MG tablet TAKE 1 TABLET BY MOUTH THREE TIMES DAILY FOR 5 DAYS AS NEEDED     traZODone (DESYREL) 100 MG tablet Take 100 mg by mouth at bedtime.     Vitamin D , Ergocalciferol , (DRISDOL ) 1.25 MG (50000 UNIT) CAPS capsule Take 1 capsule (50,000 Units total) by mouth every 7 (seven) days. 4 capsule 0   No current facility-administered medications for this visit.     ALLERGIES: Aquacel [carboxymethylcellulose], Penicillins, Adhesive [tape], Latex, and Oxycodone   Family History  Problem Relation Age of Onset   Multiple sclerosis Mother    Diabetes Mother    Hyperlipidemia Mother    Thyroid disease Mother    Depression Mother    Anxiety disorder Mother    Obesity Mother    Allergic rhinitis Mother    Hypertension Father    Diabetes Father    Hyperlipidemia Father    Obesity Father    Allergic rhinitis Father    Diabetes Maternal Grandfather    Cancer Maternal Grandfather        prostate   Heart disease Maternal Grandfather    Heart disease Paternal Grandfather    Allergic rhinitis Paternal Aunt    Allergic rhinitis Maternal Grandmother    Asthma Neg Hx    Eczema Neg Hx    Urticaria Neg Hx     Social History   Socioeconomic History   Marital status: Single    Spouse name: Not on file   Number of children: 0   Years of education: some college   Highest education level: Not on file  Occupational History   Occupation: not working d/t knees  Tobacco Use   Smoking status: Never   Smokeless tobacco: Never  Vaping Use   Vaping status: Never Used  Substance and Sexual  Activity   Alcohol use: Yes    Comment: socially 2-3 drinks per month   Drug use: No   Sexual activity: Not Currently    Birth control/protection: Pill  Other Topics Concern   Not on file  Social History Narrative   Lives with parents   Caffeine use: 2-3 times per week   Right handed   Social Drivers of Corporate investment banker Strain: Not on file  Food Insecurity: No Food Insecurity (05/28/2022)   Received from [facility], [facility]   Hunger Vital Sign    Worried About Running Out of Food in the Last Year: Never true    Ran Out of Food in the Last Year: Never true  Transportation Needs: Not on file  Physical Activity: Not on file  Stress: Not on file  Social Connections: Unknown (05/15/2022)   Received from [facility], [facility]   Social Network    Social Network: Not on file  Intimate Partner Violence: Unknown (05/15/2022)   Received from [employer], [facility]   HITS    Physically Hurt: Not on file    Insult or Talk Down To: Not on file    Threaten Physical Harm: Not on file    Scream or Curse: Not on file    Review of Systems  PHYSICAL EXAMINATION:   Pulse (!) 114   Ht 5' 1.25" (1.556 m)   Wt 258 lb (117 kg)   LMP 10/16/2023 (Exact Date)   SpO2 96%   BMI 48.35 kg/m     General appearance: alert, cooperative and appears stated age  Pelvic US  Uterus 8.11 x 3.21 x 2.78 cm.  No fibroids.  EMS 4.42 mm.  Left ovary 1.86 x 1.15 x 0.85 cm.  Right ovary 1.60 x 1.07 x 0.86 cm.  No adnexal masses.  No free fluid.  Mass to the right of the cervix measuring 2.9 x 2.1 x 2.8 cm.  Avascular with internal echoes.  ASSESSMENT:  Pelvic cyst.  Uncertain origin.    PLAN:  Pelvic US  and images reviewed.  Pelvic MRI with contrast to define further.  May need a follow up physical exam and follow up pelvic US  in 6 months depending on results of the MRI.   25 min  total time was spent for this patient encounter, including preparation, face-to-face  counseling with the patient, coordination of care, and documentation of the encounter.  At the end of the visit, the patient is asking about a breast exam today regarding a skin change.  She will schedule this appointment and follow up of MRI results for the same day.

## 2023-11-07 DIAGNOSIS — M545 Low back pain, unspecified: Secondary | ICD-10-CM | POA: Diagnosis not present

## 2023-11-12 ENCOUNTER — Telehealth: Payer: Self-pay | Admitting: Obstetrics and Gynecology

## 2023-11-12 ENCOUNTER — Encounter: Payer: Self-pay | Admitting: Obstetrics and Gynecology

## 2023-11-12 ENCOUNTER — Ambulatory Visit: Admitting: Obstetrics and Gynecology

## 2023-11-12 ENCOUNTER — Ambulatory Visit (INDEPENDENT_AMBULATORY_CARE_PROVIDER_SITE_OTHER)

## 2023-11-12 VITALS — HR 114 | Ht 61.25 in | Wt 258.0 lb

## 2023-11-12 DIAGNOSIS — N9489 Other specified conditions associated with female genital organs and menstrual cycle: Secondary | ICD-10-CM

## 2023-11-12 DIAGNOSIS — R102 Pelvic and perineal pain: Secondary | ICD-10-CM | POA: Diagnosis not present

## 2023-11-12 DIAGNOSIS — N898 Other specified noninflammatory disorders of vagina: Secondary | ICD-10-CM

## 2023-11-12 DIAGNOSIS — M2560 Stiffness of unspecified joint, not elsewhere classified: Secondary | ICD-10-CM | POA: Diagnosis not present

## 2023-11-12 DIAGNOSIS — M545 Low back pain, unspecified: Secondary | ICD-10-CM | POA: Diagnosis not present

## 2023-11-12 NOTE — Telephone Encounter (Signed)
 Spoke with patient, reviewed imaging locations, MRI order placed for [facility].  Patient will call to schedule, number provided. Reviewed authorization process, patient aware our office will notify if any additional information is needed. Patient aware to contact office if she needs any additional assistance. Patient verbalizes understanding and is agreeable.

## 2023-11-12 NOTE — Telephone Encounter (Signed)
 Please schedule pelvic MRI with contrast.  My patient has a cyst to the right of the vagina.  It is not clear if it is cervical or vaginal in nature.

## 2023-11-14 NOTE — Telephone Encounter (Signed)
 Pt scheduled for pelvic MRI @ [facility] on 11/15/2023. Encounter closed.

## 2023-11-14 NOTE — Addendum Note (Signed)
 Addended by: Konstantinos Cordoba N on: 11/14/2023 04:19 PM   Modules accepted: Orders

## 2023-11-14 NOTE — Telephone Encounter (Signed)
 [facility] request new order for MRI pelvis w/wo contrast, ok per Dr. Colvin Dec, new order placed. See imaging referral for updates.

## 2023-11-14 NOTE — Addendum Note (Signed)
 Addended by: Roen Macgowan N on: 11/14/2023 04:17 PM   Modules accepted: Orders

## 2023-11-15 ENCOUNTER — Ambulatory Visit (HOSPITAL_BASED_OUTPATIENT_CLINIC_OR_DEPARTMENT_OTHER)

## 2023-11-21 ENCOUNTER — Ambulatory Visit (HOSPITAL_BASED_OUTPATIENT_CLINIC_OR_DEPARTMENT_OTHER)
Admission: RE | Admit: 2023-11-21 | Discharge: 2023-11-21 | Disposition: A | Discharge status: Home / Self Care | Source: Ambulatory Visit | Attending: Obstetrics and Gynecology | Admitting: Obstetrics and Gynecology

## 2023-11-21 DIAGNOSIS — N9489 Other specified conditions associated with female genital organs and menstrual cycle: Secondary | ICD-10-CM | POA: Diagnosis present

## 2023-11-21 MED ORDER — GADOBUTROL 1 MMOL/ML IV SOLN
10.0000 mL | Freq: Once | INTRAVENOUS | Status: AC | PRN
Start: 2023-11-21 — End: 2023-11-21
  Administered 2023-11-21: 10 mL via INTRAVENOUS
  Filled 2023-11-21: qty 10

## 2023-11-26 NOTE — Progress Notes (Addendum)
 [name] D.[name] [address] [place] [address] Phone: [phone]   Assessment and Plan:     1. Chronic pain of both shoulders 2. Bilateral hip pain 3. EDS (Ehlers-Danlos syndrome) 4. Polyarthralgia 5. Neck pain 6. Chronic bilateral thoracic back pain 7. Chronic bilateral low back pain without sciatica 8. Somatic dysfunction of cervical region 9. Somatic dysfunction of lumbar region 10. Somatic dysfunction of pelvic region 11. Somatic dysfunction of rib region 12. Somatic dysfunction of thoracic region  -Chronic with exacerbation, subsequent visit - History of hypermobility, fibromyalgia, chronic musculoskeletal pain in multiple areas, under current workup for possible EDS - Continue chronic pain medications including muscle relaxers, gabapentin  - Continue home exercises and physical therapy.  Add new HEP and physical therapy targeting shoulders and hips to increase stability and decrease chronic pain - Patient has received relief with OMT in the past.  Elects for repeat OMT today.  Tolerated well per note below. - Decision today to treat with OMT was based on Physical Exam  After verbal consent patient was treated with HVLA (high velocity low amplitude), ME (muscle energy), FPR (flex positional release), ST (soft tissue), PC/PD (Pelvic Compression/ Pelvic Decompression) techniques in cervical, rib, thoracic, lumbar, and pelvic areas. Patient tolerated the procedure well with improvement in symptoms.  Patient educated on potential side effects of soreness and recommended to rest, hydrate, and use Tylenol  as needed for pain control.   15 additional minutes spent for educating Therapeutic Home Exercise Program.  This included exercises focusing on stretching, strengthening, with focus on eccentric aspects.   Long term goals include an improvement in range of motion, strength, endurance as well as avoiding reinjury. Patient's frequency  would include in 1-2 times a day, 3-5 times a week for a duration of 6-12 weeks. Proper technique shown and discussed handout in great detail with ATC.  All questions were discussed and answered.    Pertinent previous records reviewed include none  Follow Up: 3 to 4 weeks for reevaluation.  Could consider repeat OMT   Subjective:   I, Cynthia [name], am serving as a Neurosurgeon for Doctor Ulysees Gander  Chief Complaint: OMT   HPI:    10/30/2023 Patient is a 38 year old female with EDS. Patient states recent diagnosis EDS. Fall right before christmas off of 6 ft ladder. Hx of back, knee, shoulder injuries. Whole back pain as of late. She feels crooked at the hips right hip especially. Back ,neck and shoulders are tight. She is in PT and they do dry needle and that has really helped.   11/27/2023 Patient states she is ready for tune up    Relevant Historical Information: Hypermobility, morbid obesity,  Additional pertinent review of systems negative.   Current Outpatient Medications:    albuterol  (VENTOLIN  HFA) 108 (90 Base) MCG/ACT inhaler, Inhale 2 puffs into the lungs every 4 (four) hours as needed for wheezing or shortness of breath., Disp: 1 each, Rfl: 3   Beclomethasone Dipropionate  (QNASL ) 80 MCG/ACT AERS, Place 2 Pump into the nose daily as needed., Disp: 10.6 g, Rfl: 5   BLISOVI FE 1/20 1-20 MG-MCG tablet, Take 1 tablet by mouth daily., Disp: , Rfl:    COVID-19 mRNA bivalent vaccine, Pfizer, (PFIZER COVID-19 VAC BIVALENT) injection, Inject into the muscle., Disp: 0.3 mL, Rfl: 0   cyanocobalamin  (VITAMIN B12) 1000 MCG tablet, Take by mouth., Disp: , Rfl:    cyclobenzaprine (FLEXERIL) 10 MG tablet, TAKE 1 TABLET BY MOUTH  THREE TIMES DAILY FOR 15 DAYS AS NEEDED FOR MUSCLE SPASMS, Disp: , Rfl:    EPINEPHrine  0.3 mg/0.3 mL IJ SOAJ injection, Inject 0.3 mg into the muscle as needed for anaphylaxis., Disp: 1 each, Rfl: 2   fexofenadine  (ALLEGRA ) 180 MG tablet, Take 1 tablet (180 mg total)  by mouth daily., Disp: 90 tablet, Rfl: 1   FLUoxetine  (PROZAC ) 40 MG capsule, Take 1 capsule (40 mg total) by mouth daily., Disp: 90 capsule, Rfl: 0   gabapentin  (NEURONTIN ) 300 MG capsule, TAKE 1 CAPSULE BY MOUTH EVERY MORNING, 1 CAPSULE IN THE EVENING, AND 2 CAPSULES AT BEDTIME//Please call and make overdue appt for further refills, Disp: 120 capsule, Rfl: 0   HYDROcodone -acetaminophen  (NORCO/VICODIN) 5-325 MG tablet, Take 1 tablet by mouth every 6 (six) hours as needed., Disp: , Rfl:    hyoscyamine (LEVSIN SL) 0.125 MG SL tablet, PLACE 1 TABLET UNDER THE TONGUE PRIOR TO EACH MEAL AND AT BEDTIME AS NEEDED, Disp: , Rfl:    levocetirizine (XYZAL ) 5 MG tablet, TAKE 1 TABLET(5 MG) BY MOUTH EVERY EVENING, Disp: 90 tablet, Rfl: 1   Lisdexamfetamine Dimesylate  (VYVANSE ) 40 MG CHEW, Chew 40 mg by mouth daily., Disp: 30 tablet, Rfl: 0   Lisdexamfetamine Dimesylate  (VYVANSE ) 40 MG CHEW, Chew 40 mg by mouth daily at 12 noon., Disp: 30 tablet, Rfl: 0   methocarbamol  (ROBAXIN ) 500 MG tablet, Take 1 tablet (500 mg total) by mouth 2 (two) times daily as needed for muscle spasms., Disp: 60 tablet, Rfl: 0   montelukast  (SINGULAIR ) 10 MG tablet, TAKE 1 TABLET(10 MG) BY MOUTH AT BEDTIME. NEED APPOINTMENT FOR ADDITIONAL REFILLS., Disp: 90 tablet, Rfl: 1   norethindrone -ethinyl estradiol (LOESTRIN) 1-20 MG-MCG tablet, Take 1 tablet by mouth daily., Disp: , Rfl:    omeprazole (PRILOSEC) 20 MG capsule, 1 capsule 30 minutes before morning meal, Disp: , Rfl:    ondansetron  (ZOFRAN  ODT) 4 MG disintegrating tablet, Take 1 tablet (4 mg total) by mouth every 8 (eight) hours as needed for nausea or vomiting., Disp: 20 tablet, Rfl: 0   Semaglutide -Weight Management 1 MG/0.5ML SOAJ, Inject 1 mg into the skin once a week., Disp: 6 mL, Rfl: 0   Short Ragweed Pollen Ext (RAGWITEK) 12 AMB A 1-U SUBL, Place 1 tablet under the tongue daily. Bring this prescription to your appointment and you will take the first SL tablet in the clinic.,  Disp: 30 tablet, Rfl: 5   SYRINGE-NEEDLE, DISP, 3 ML (EASYPOINT NEEDLE/SYRINGE) 25G X 5/8" 3 ML MISC, Use one needle/syringe to inject Vitamin B12 into the muscle once every 7 days. *Disp. whatever insurance will cover*, Disp: , Rfl:    tirzepatide  (ZEPBOUND ) 10 MG/0.5ML Pen, Inject 10 mg into the skin once a week., Disp: 2 mL, Rfl: 0   tirzepatide  (ZEPBOUND ) 12.5 MG/0.5ML Pen, Inject 12.5 mg into the skin every 7 (seven) days., Disp: 2 mL, Rfl: 0   tirzepatide  (ZEPBOUND ) 15 MG/0.5ML Pen, Inject 15 mg into the skin once a week., Disp: 2 mL, Rfl: 0   tirzepatide  (ZEPBOUND ) 5 MG/0.5ML injection vial, Inject 5 mg into the skin once a week., Disp: 6 mL, Rfl: 0   tirzepatide  (ZEPBOUND ) 5 MG/0.5ML Pen, Inject 5 mg into the skin once a week., Disp: 6 mL, Rfl: 0   traMADol (ULTRAM) 50 MG tablet, TAKE 1 TABLET BY MOUTH THREE TIMES DAILY FOR 5 DAYS AS NEEDED, Disp: , Rfl:    traZODone (DESYREL) 100 MG tablet, Take 100 mg by mouth at bedtime., Disp: , Rfl:  Vitamin D , Ergocalciferol , (DRISDOL ) 1.25 MG (50000 UNIT) CAPS capsule, Take 1 capsule (50,000 Units total) by mouth every 7 (seven) days., Disp: 4 capsule, Rfl: 0   Objective:     Vitals:   11/27/23 1302  Pulse: (!) 135  SpO2: 95%  Weight: 258 lb (117 kg)  Height: 5\' 1"  (1.549 m)      Body mass index is 48.75 kg/m.    Physical Exam:    General: Well-appearing, cooperative, sitting comfortably in no acute distress.   OMT Physical Exam:  ASIS Compression Test: Positive Right Cervical: TTP paraspinal, C3 RRSL, C6 RLSR Rib: Bilateral elevated first rib with TTP Thoracic: TTP paraspinal, T4 RRSR,  T6 RLSL Lumbar: TTP paraspinal, L1-3 RRSL Pelvis: Right anterior innominate    Electronically signed by:  Marshall Skeeter D.[name] 1:36 PM 11/27/23

## 2023-11-27 ENCOUNTER — Ambulatory Visit (HOSPITAL_COMMUNITY): Attending: Cardiology

## 2023-11-27 ENCOUNTER — Ambulatory Visit: Admitting: Sports Medicine

## 2023-11-27 VITALS — HR 135 | Ht 61.0 in | Wt 258.0 lb

## 2023-11-27 DIAGNOSIS — M2550 Pain in unspecified joint: Secondary | ICD-10-CM

## 2023-11-27 DIAGNOSIS — M9905 Segmental and somatic dysfunction of pelvic region: Secondary | ICD-10-CM

## 2023-11-27 DIAGNOSIS — M9908 Segmental and somatic dysfunction of rib cage: Secondary | ICD-10-CM

## 2023-11-27 DIAGNOSIS — Q796 Ehlers-Danlos syndrome, unspecified: Secondary | ICD-10-CM

## 2023-11-27 DIAGNOSIS — M9901 Segmental and somatic dysfunction of cervical region: Secondary | ICD-10-CM

## 2023-11-27 DIAGNOSIS — M25552 Pain in left hip: Secondary | ICD-10-CM | POA: Diagnosis not present

## 2023-11-27 DIAGNOSIS — G8929 Other chronic pain: Secondary | ICD-10-CM

## 2023-11-27 DIAGNOSIS — M25512 Pain in left shoulder: Secondary | ICD-10-CM | POA: Diagnosis not present

## 2023-11-27 DIAGNOSIS — M9903 Segmental and somatic dysfunction of lumbar region: Secondary | ICD-10-CM

## 2023-11-27 DIAGNOSIS — M546 Pain in thoracic spine: Secondary | ICD-10-CM

## 2023-11-27 DIAGNOSIS — M25551 Pain in right hip: Secondary | ICD-10-CM | POA: Diagnosis not present

## 2023-11-27 DIAGNOSIS — M9902 Segmental and somatic dysfunction of thoracic region: Secondary | ICD-10-CM

## 2023-11-27 DIAGNOSIS — M25511 Pain in right shoulder: Secondary | ICD-10-CM | POA: Diagnosis not present

## 2023-11-27 DIAGNOSIS — M542 Cervicalgia: Secondary | ICD-10-CM

## 2023-11-27 NOTE — Patient Instructions (Addendum)
 Shoulder and hip HEP  [facility] [place] 3-4 week follow up

## 2023-11-29 ENCOUNTER — Encounter: Payer: Self-pay | Admitting: Obstetrics and Gynecology

## 2023-12-02 DIAGNOSIS — M5416 Radiculopathy, lumbar region: Secondary | ICD-10-CM | POA: Insufficient documentation

## 2023-12-02 DIAGNOSIS — M25551 Pain in right hip: Secondary | ICD-10-CM | POA: Insufficient documentation

## 2023-12-03 NOTE — Progress Notes (Unsigned)
 GYNECOLOGY  VISIT   HPI: 38 y.o.   Single  Caucasian female   G0P0000 with No LMP recorded. (Menstrual status: Oral contraceptives).   here for: Breast check and to discuss MRI results     Right breast with a skin change, indentation, for a year.  No masses noted.  No prior mammogram.   She has a dermatologist.   She does report significant sweating.   At her office visit on 10/31/23, she was noted to have a cyst in the vagina thought to be a Gartner's Duct cyst or a large Nabothian cyst.  Pelvic US  done 11/12/23 showed a 2.9 x 2.1 x 2.8 cm mass to the right of the cervix, avascular with internal echoes.  MRI done 11/21/23: Study Result  Narrative & Impression  CLINICAL DATA:  Cyst in the vicinity of the vagina, characterize   EXAM: MRI PELVIS WITHOUT AND WITH CONTRAST   TECHNIQUE: Multiplanar multisequence MR imaging of the pelvis was performed both before and after administration of intravenous contrast.   CONTRAST:  10mL GADAVIST  GADOBUTROL  1 MMOL/ML IV SOLN   COMPARISON:  Pelvic ultrasound, 11/12/2023   FINDINGS: Urinary Tract:  No abnormality visualized.   Bowel:  Unremarkable visualized pelvic bowel loops.   Vascular/Lymphatic: No pathologically enlarged lymph nodes. No significant vascular abnormality seen.   Reproductive: At the right aspect of the upper vagina and cervix centered near the cervical os, there is an intrinsically T1 hyperintense hemorrhagic or proteinaceous cyst measuring 3.2 x 2.5 cm (series 13, image 45, series 9, image 1). No associated solid component or contrast enhancement. Otherwise normal uterus. Small ovarian follicles, benign, requiring no further follow-up or characterization.   Other:  None.   Musculoskeletal: No suspicious bone lesions identified.   IMPRESSION: At the right aspect of the upper vagina and cervix centered near the cervical os, there is an intrinsically T1 hyperintense hemorrhagic or proteinaceous cyst  measuring 3.2 x 2.5 cm. No associated solid component or contrast enhancement. This may reflect either an unusually large cervical nabothian cyst or a Gartner's duct cyst. Both of these entities are benign and require no specific further follow-up or characterization.     Electronically Signed   By: Fredricka Jenny M.D.   On: 11/27/2023 06:51      GYNECOLOGIC HISTORY: No LMP recorded. (Menstrual status: Oral contraceptives). Contraception:  OCP Menopausal hormone therapy:  n/a Last 2 paps:  10/31/23 neg HR HPV neg, 08/04/15 neg  History of abnormal Pap or positive HPV:  no Mammogram:  n/a        OB History     Gravida  0   Para  0   Term  0   Preterm  0   AB  0   Living  0      SAB  0   IAB  0   Ectopic  0   Multiple  0   Live Births  0              Patient Active Problem List   Diagnosis Date Noted   Mild intermittent asthma without complication 04/18/2023   Nephrolithiasis 02/13/2023   Acute non-recurrent maxillary sinusitis 09/13/2022   Increased heart rate 10/11/2021   Binge eating disorder 10/11/2021   Impaired fasting glucose 10/11/2021   GAD (generalized anxiety disorder) 10/11/2021   Attention deficit disorder (ADD) without hyperactivity 09/17/2019   Primary osteoarthritis of both knees 08/25/2019   Anxiety 06/04/2019   DDD (degenerative disc disease), cervical 06/03/2018  Degeneration of lumbar intervertebral disc 06/03/2018   Seasonal and perennial allergic rhinitis 05/05/2018   Seasonal allergic conjunctivitis 05/05/2018   Moderate persistent asthma, uncomplicated 05/05/2018   S/P arthroscopy of knee 04/11/2018   Prediabetes 06/11/2017   Vitamin D  deficiency 06/11/2017   Class 3 severe obesity with serious comorbidity and body mass index (BMI) of 45.0 to 49.9 in adult 01/09/2012   Asthma     Past Medical History:  Diagnosis Date   ADHD (attention deficit hyperactivity disorder)    Allergic rhinitis    Anxiety    Asthma    Back  injury    COMPETITIVE INJURY IN HIGH SCHOOL   Back pain    Depression    Ehlers-Danlos syndrome    Fatty liver    Fibromyalgia    Gallbladder problem    GERD (gastroesophageal reflux disease)    IBS (irritable bowel syndrome)    Insulin  resistance    Joint pain    Kidney stones    Migraines    no aura   OA (osteoarthritis) of knee    Obesity    Pneumonia    PONV (postoperative nausea and vomiting)    Seasonal allergies    Vitamin D  deficiency     Past Surgical History:  Procedure Laterality Date   CHOLECYSTECTOMY     DG DILATION URETERS     KNEE ARTHROSCOPY Left    KNEE ARTHROSCOPY WITH LATERAL MENISECTOMY Right 04/11/2018   Procedure: RIGHT KNEE ARTHROSCOPY WITH PARTIAL MENISECTOMY CHONDROPLASTY;  Surgeon: Genevie Kerns, MD;  Location: [facility];  Service: Orthopedics;  Laterality: Right;   MOUTH SURGERY     SHOULDER SURGERY  2002   RIGHT   WISDOM TOOTH EXTRACTION     WRIST ARTHROSCOPY     Right    Current Outpatient Medications  Medication Sig Dispense Refill   albuterol  (VENTOLIN  HFA) 108 (90 Base) MCG/ACT inhaler Inhale 2 puffs into the lungs every 4 (four) hours as needed for wheezing or shortness of breath. 1 each 3   Beclomethasone Dipropionate  (QNASL ) 80 MCG/ACT AERS Place 2 Pump into the nose daily as needed. 10.6 g 5   BLISOVI FE 1/20 1-20 MG-MCG tablet Take 1 tablet by mouth daily.     COVID-19 mRNA bivalent vaccine, Pfizer, (PFIZER COVID-19 VAC BIVALENT) injection Inject into the muscle. 0.3 mL 0   cyanocobalamin  (VITAMIN B12) 1000 MCG tablet Take by mouth.     cyclobenzaprine (FLEXERIL) 10 MG tablet TAKE 1 TABLET BY MOUTH THREE TIMES DAILY FOR 15 DAYS AS NEEDED FOR MUSCLE SPASMS     EPINEPHrine  0.3 mg/0.3 mL IJ SOAJ injection Inject 0.3 mg into the muscle as needed for anaphylaxis. 1 each 2   fexofenadine  (ALLEGRA ) 180 MG tablet Take 1 tablet (180 mg total) by mouth daily. 90 tablet 1   FLUoxetine  (PROZAC ) 40 MG capsule Take 1 capsule (40 mg total) by mouth  daily. 90 capsule 0   gabapentin  (NEURONTIN ) 300 MG capsule TAKE 1 CAPSULE BY MOUTH EVERY MORNING, 1 CAPSULE IN THE EVENING, AND 2 CAPSULES AT BEDTIME//Please call and make overdue appt for further refills 120 capsule 0   HYDROcodone -acetaminophen  (NORCO/VICODIN) 5-325 MG tablet Take 1 tablet by mouth every 6 (six) hours as needed.     hyoscyamine (LEVSIN SL) 0.125 MG SL tablet PLACE 1 TABLET UNDER THE TONGUE PRIOR TO EACH MEAL AND AT BEDTIME AS NEEDED     levocetirizine (XYZAL ) 5 MG tablet TAKE 1 TABLET(5 MG) BY MOUTH EVERY EVENING 90 tablet 1  Lisdexamfetamine Dimesylate  (VYVANSE ) 40 MG CHEW Chew 40 mg by mouth daily. 30 tablet 0   Lisdexamfetamine Dimesylate  (VYVANSE ) 40 MG CHEW Chew 40 mg by mouth daily at 12 noon. 30 tablet 0   methocarbamol  (ROBAXIN ) 500 MG tablet Take 1 tablet (500 mg total) by mouth 2 (two) times daily as needed for muscle spasms. 60 tablet 0   montelukast  (SINGULAIR ) 10 MG tablet TAKE 1 TABLET(10 MG) BY MOUTH AT BEDTIME. NEED APPOINTMENT FOR ADDITIONAL REFILLS. 90 tablet 1   norethindrone -ethinyl estradiol (LOESTRIN) 1-20 MG-MCG tablet Take 1 tablet by mouth daily.     nystatin (MYCOSTATIN/NYSTOP) powder Apply 1 Application topically 3 (three) times daily. Apply to affected area for up to 7 days 30 g 2   omeprazole (PRILOSEC) 20 MG capsule 1 capsule 30 minutes before morning meal     ondansetron  (ZOFRAN  ODT) 4 MG disintegrating tablet Take 1 tablet (4 mg total) by mouth every 8 (eight) hours as needed for nausea or vomiting. 20 tablet 0   prednisoLONE acetate (PRED FORTE) 1 % ophthalmic suspension INSTILL ONE DROP INTO BOTH EYES TWICE DAILY FOR 1 MONTH     Semaglutide -Weight Management 1 MG/0.5ML SOAJ Inject 1 mg into the skin once a week. 6 mL 0   Short Ragweed Pollen Ext (RAGWITEK) 12 AMB A 1-U SUBL Place 1 tablet under the tongue daily. Bring this prescription to your appointment and you will take the first SL tablet in the clinic. 30 tablet 5   SYRINGE-NEEDLE, DISP, 3  ML (EASYPOINT NEEDLE/SYRINGE) 25G X 5/8" 3 ML MISC Use one needle/syringe to inject Vitamin B12 into the muscle once every 7 days. *Disp. whatever insurance will cover*     tirzepatide  (ZEPBOUND ) 10 MG/0.5ML Pen Inject 10 mg into the skin once a week. 2 mL 0   tirzepatide  (ZEPBOUND ) 12.5 MG/0.5ML Pen Inject 12.5 mg into the skin every 7 (seven) days. 2 mL 0   tirzepatide  (ZEPBOUND ) 15 MG/0.5ML Pen Inject 15 mg into the skin once a week. 2 mL 0   tirzepatide  (ZEPBOUND ) 5 MG/0.5ML injection vial Inject 5 mg into the skin once a week. 6 mL 0   tirzepatide  (ZEPBOUND ) 5 MG/0.5ML Pen Inject 5 mg into the skin once a week. 6 mL 0   traMADol (ULTRAM) 50 MG tablet TAKE 1 TABLET BY MOUTH THREE TIMES DAILY FOR 5 DAYS AS NEEDED     traZODone (DESYREL) 100 MG tablet Take 100 mg by mouth at bedtime.     Vitamin D , Ergocalciferol , (DRISDOL ) 1.25 MG (50000 UNIT) CAPS capsule Take 1 capsule (50,000 Units total) by mouth every 7 (seven) days. 4 capsule 0   No current facility-administered medications for this visit.     ALLERGIES: Aquacel [carboxymethylcellulose], Penicillins, Adhesive [tape], Latex, and Oxycodone   Family History  Problem Relation Age of Onset   Multiple sclerosis Mother    Diabetes Mother    Hyperlipidemia Mother    Thyroid disease Mother    Depression Mother    Anxiety disorder Mother    Obesity Mother    Allergic rhinitis Mother    Hypertension Father    Diabetes Father    Hyperlipidemia Father    Obesity Father    Allergic rhinitis Father    Diabetes Maternal Grandfather    Cancer Maternal Grandfather        prostate   Heart disease Maternal Grandfather    Heart disease Paternal Grandfather    Allergic rhinitis Paternal Aunt    Allergic rhinitis Maternal Grandmother  Asthma Neg Hx    Eczema Neg Hx    Urticaria Neg Hx     Social History   Socioeconomic History   Marital status: Single    Spouse name: Not on file   Number of children: 0   Years of education: some  college   Highest education level: Not on file  Occupational History   Occupation: not working d/t knees  Tobacco Use   Smoking status: Never   Smokeless tobacco: Never  Vaping Use   Vaping status: Never Used  Substance and Sexual Activity   Alcohol use: Yes    Comment: socially 2-3 drinks per month   Drug use: No   Sexual activity: Not Currently    Birth control/protection: Pill  Other Topics Concern   Not on file  Social History Narrative   Lives with parents   Caffeine use: 2-3 times per week   Right handed   Social Drivers of Corporate investment banker Strain: Not on file  Food Insecurity: No Food Insecurity (05/28/2022)   Received from [facility], [facility]   Hunger Vital Sign    Worried About Running Out of Food in the Last Year: Never true    Ran Out of Food in the Last Year: Never true  Transportation Needs: Not on file  Physical Activity: Not on file  Stress: Not on file  Social Connections: Unknown (05/15/2022)   Received from [facility], [facility]   Social Network    Social Network: Not on file  Intimate Partner Violence: Unknown (05/15/2022)   Received from [facility], [facility]   HITS    Physically Hurt: Not on file    Insult or Talk Down To: Not on file    Threaten Physical Harm: Not on file    Scream or Curse: Not on file    Review of Systems  All other systems reviewed and are negative.   PHYSICAL EXAMINATION:   BP 118/84 (BP Location: Left Arm, Patient Position: Sitting)   Pulse (!) 110   SpO2 95%     General appearance: alert, cooperative and appears stated age   Breasts: right - normal appearance, no masses or tenderness, No nipple retraction or dimpling, No nipple discharge or bleeding, No axillary adenopathy.  7 mm raised dry skin change, scaly.  Left - normal appearance, no masses or tenderness, No nipple retraction or dimpling, No nipple discharge or bleeding, No axillary adenopathy. Erythema under left breast.   Pelvic: External genitalia:  no lesions              Urethra:  normal appearing urethra with no masses, tenderness or lesions              Bartholins and Skenes: normal                 Vagina: normal appearing vagina with normal color and discharge, no lesions.  No polyp noted.               Cervix: no lesions.                  Bimanual Exam:  Uterus:  normal size, contour, position, consistency, mobility, non-tender              Adnexa: no mass, fullness, tenderness       Chaperone was present for exam:  Jada M, CMA  ASSESSMENT:  Seborrheaic keratosis.  Candida of flexural skin.  Left under breast area.  Nabothian  cyst suspected.   Sweating.   PLAN:  She will see dermatology in follow up to her breast skin change and her sweating.  Rx for Nystatin powder.  Reassurance regarding the cyst noted vaginally.  I favor that this is a large Nabothian cyst.  3D pelvic model used to explain the cyst location.  I do not recommend any procedures to drain or remove the cyst.  Return for annual exam appointment and prn.   30 min  total time was spent for this patient encounter, including preparation, face-to-face counseling with the patient, coordination of care, and documentation of the encounter.

## 2023-12-04 ENCOUNTER — Ambulatory Visit: Admitting: Obstetrics and Gynecology

## 2023-12-04 ENCOUNTER — Encounter: Payer: Self-pay | Admitting: Obstetrics and Gynecology

## 2023-12-04 VITALS — BP 118/84 | HR 110

## 2023-12-04 DIAGNOSIS — N9489 Other specified conditions associated with female genital organs and menstrual cycle: Secondary | ICD-10-CM | POA: Diagnosis not present

## 2023-12-04 DIAGNOSIS — B372 Candidiasis of skin and nail: Secondary | ICD-10-CM | POA: Diagnosis not present

## 2023-12-04 MED ORDER — NYSTATIN 100000 UNIT/GM EX POWD: 1.0000 | Freq: Three times a day (TID) | CUTANEOUS | 2 refills | Status: DC

## 2023-12-06 ENCOUNTER — Other Ambulatory Visit (HOSPITAL_BASED_OUTPATIENT_CLINIC_OR_DEPARTMENT_OTHER): Payer: Self-pay

## 2023-12-06 MED ORDER — ZEPBOUND 7.5 MG/0.5ML ~~LOC~~ SOAJ
7.5000 mg | SUBCUTANEOUS | 0 refills | Status: DC
  Filled 2023-12-06: qty 6, 84d supply, fill #0

## 2023-12-07 ENCOUNTER — Other Ambulatory Visit (HOSPITAL_BASED_OUTPATIENT_CLINIC_OR_DEPARTMENT_OTHER): Payer: Self-pay

## 2023-12-09 ENCOUNTER — Ambulatory Visit: Admitting: Family Medicine

## 2023-12-10 ENCOUNTER — Ambulatory Visit: Admitting: Podiatry

## 2023-12-17 ENCOUNTER — Encounter: Payer: Self-pay | Admitting: Dermatology

## 2023-12-17 ENCOUNTER — Ambulatory Visit (INDEPENDENT_AMBULATORY_CARE_PROVIDER_SITE_OTHER): Admitting: Dermatology

## 2023-12-17 VITALS — BP 127/92 | HR 136

## 2023-12-17 DIAGNOSIS — Z1283 Encounter for screening for malignant neoplasm of skin: Secondary | ICD-10-CM | POA: Diagnosis not present

## 2023-12-17 DIAGNOSIS — L814 Other melanin hyperpigmentation: Secondary | ICD-10-CM | POA: Diagnosis not present

## 2023-12-17 DIAGNOSIS — L821 Other seborrheic keratosis: Secondary | ICD-10-CM

## 2023-12-17 DIAGNOSIS — W908XXA Exposure to other nonionizing radiation, initial encounter: Secondary | ICD-10-CM

## 2023-12-17 DIAGNOSIS — L74519 Primary focal hyperhidrosis, unspecified: Secondary | ICD-10-CM

## 2023-12-17 DIAGNOSIS — L578 Other skin changes due to chronic exposure to nonionizing radiation: Secondary | ICD-10-CM

## 2023-12-17 DIAGNOSIS — D229 Melanocytic nevi, unspecified: Secondary | ICD-10-CM

## 2023-12-17 DIAGNOSIS — D1801 Hemangioma of skin and subcutaneous tissue: Secondary | ICD-10-CM

## 2023-12-17 MED ORDER — GLYCOPYRROLATE 1 MG PO TABS: 1.0000 mg | ORAL_TABLET | Freq: Three times a day (TID) | ORAL | 1 refills | Status: DC

## 2023-12-17 NOTE — Progress Notes (Signed)
   New Patient Visit   Subjective  Cynthia [name] is a 38 y.o. female who presents for the following: Skin Cancer Screening and Full Body Skin Exam. No hx of skin cancer. Family hx of MM.  The patient presents for Total-Body Skin Exam (TBSE) for skin cancer screening and mole check. The patient has spots, moles and lesions to be evaluated, some may be new or changing.  The following portions of the chart were reviewed this encounter and updated as appropriate: medications, allergies, medical history  Review of Systems:  No other skin or systemic complaints except as noted in HPI or Assessment and Plan.  Objective  Well appearing patient in no apparent distress; mood and affect are within normal limits.  A full examination was performed including scalp, head, eyes, ears, nose, lips, neck, chest, axillae, abdomen, back, buttocks, bilateral upper extremities, bilateral lower extremities, hands, feet, fingers, toes, fingernails, and toenails. All findings within normal limits unless otherwise noted below.   Relevant physical exam findings are noted in the Assessment and Plan.    Assessment & Plan   SKIN CANCER SCREENING PERFORMED TODAY.  ACTINIC DAMAGE - Chronic condition, secondary to cumulative UV/sun exposure - diffuse scaly erythematous macules with underlying dyspigmentation - Recommend daily broad spectrum sunscreen SPF 30+ to sun-exposed areas, reapply every 2 hours as needed.  - Staying in the shade or wearing long sleeves, sun glasses (UVA+UVB protection) and wide brim hats (4-inch brim around the entire circumference of the hat) are also recommended for sun protection.  - Call for new or changing lesions.  LENTIGINES, SEBORRHEIC KERATOSES, HEMANGIOMAS - Benign normal skin lesions - Benign-appearing - Call for any changes  MELANOCYTIC NEVI - Tan-brown and/or pink-flesh-colored symmetric macules and papules - Benign appearing on exam today - Observation - Call clinic for  new or changing moles - Recommend daily use of broad spectrum spf 30+ sunscreen to sun-exposed areas.   Primary Hyperhidrosis The patient was seen today for follow-up regarding management of primary hyperhidrosis. Despite consistent use, the patient reports minimal improvement with topical aluminum chloride (Drysol), indicating treatment failure. After discussing alternative options, we reviewed the risks, benefits, and potential side effects of oral anticholinergic therapy. The patient expressed understanding and agreed to proceed with a trial of glycopyrrolate. Counseling included potential side effects such as dry mouth, urinary retention, constipation, and blurred vision. The importance of starting with a low dose and monitoring for tolerance and efficacy was emphasized. The patient will follow up as needed or sooner if adverse effects occur.  Return in about 2 years (around 12/16/2025) for [facility] also an appointment for Hyperhydrosis with Cornelius Dill or Dr. Myrtie Atkinson .  I, Haig Levan, Surg Tech III, am acting as scribe for Deneise Finlay, MD.   Documentation: I have reviewed the above documentation for accuracy and completeness, and I agree with the above.  Deneise Finlay, MD

## 2023-12-17 NOTE — Patient Instructions (Signed)

## 2023-12-19 ENCOUNTER — Ambulatory Visit (INDEPENDENT_AMBULATORY_CARE_PROVIDER_SITE_OTHER): Admitting: Podiatry

## 2023-12-19 ENCOUNTER — Encounter: Payer: Self-pay | Admitting: Podiatry

## 2023-12-19 DIAGNOSIS — L600 Ingrowing nail: Secondary | ICD-10-CM | POA: Diagnosis not present

## 2023-12-19 NOTE — Progress Notes (Signed)
 Subjective:  Patient ID: Cynthia [name], female    DOB: 01-31-1986,  MRN: [id number]  Chief Complaint  Patient presents with   Ingrown Toenail    RM#12 Left big toe inner area and right pinky toe has concerns of ingrown nails.    Discussed the use of AI scribe software for clinical note transcription with the patient, who gave verbal consent to proceed.  History of Present Illness Cynthia [name] is a 38 year old female who presents with concerns about ingrown toenails and potential nail fungus.  Irritation is present on the inside of both big toes, more pronounced on the left, without redness or drainage. There is a problem with the pinky toe on the right foot where a part of the nail or an extra growth becomes long and causes significant pain. She has not received professional treatment for these issues.  She experiences hyperhidrosis with extremely sweaty feet and has been prescribed glycopyrrolate 1 mg, but has not started this medication. She has tried various topical treatments and foot powders.  She has dry skin and bothersome cuticles, especially on her pinky toes, and recently started using OPI cuticle oil. She avoids pointed-toe shoes, visits reputable nail salons, and has difficulty finding suitable shoes due to a wide toe box and narrow heel. She avoids wearing heels due to past ankle and knee injuries.      Objective:    Physical Exam General: AAO x3, NAD  Dermatological: Mild incurvation present to the hallux toenails laterally with tenderness palpation on the distal portion the nail borders.  The nail is somewhat hypertrophic as some yellow, brown discoloration along the nail distally.  Along the lateral aspect the right fifth toe is a small corn consistent with a listers corn.  No open lesions.  No drainage or pus or any signs of infection today to the feet.  Vascular: Dorsalis Pedis artery and Posterior Tibial artery pedal pulses are 2/4 bilateral with immedate  capillary fill time.  There is no pain with calf compression, swelling, warmth, erythema.   Neruologic: Grossly intact via light touch bilateral.   Musculoskeletal: She has tenderness along the ingrown toenails distally on the hallux toenails as well as the right fifth toe and the skin lesion.  There is no other areas of discomfort identified at this time.  Adductovarus is present on the fifth toes.  Gait: Unassisted, Nonantalgic.     No images are attached to the encounter.    Results    Assessment:   1. Ingrown toenail      Plan:  Patient was evaluated and treated and all questions answered.  Assessment and Plan Assessment & Plan Ingrown toenails with possible fungal infection Mild ingrown toenails on both halluces, more pronounced on the left, with possible fungal infection. No signs of infection. Discussed nail avulsion if infection or pain occurs. -We discussed conservative as well as surgical intervention remove the ingrown portion of the toenails.  She wants to proceed with conservative treatment. - Send nail clippings for culture to check for fungal or bacterial infection. - Advise regular trimming of toenails - Recommend using tea tree oil for antifungal effects and nail hydration. - Advise soaking feet in warm water with vinegar or Epsom salt.  Lister's corn on toe/ingrown toenail Lister's corn on the fifth toe due to pressure from toe positioning and weight distribution. Discussed possible excision, noting potential recurrence. - Trim and clean the affected area. - Recommend using pads and cushions to relieve pressure.  Dispensed today    Return if symptoms worsen or fail to improve.   Charity Conch DPM

## 2023-12-19 NOTE — Patient Instructions (Signed)
 Ingrown Toenail  An ingrown toenail occurs when the corner or sides of a toenail grow into the surrounding skin. This causes discomfort and pain. The big toe is most commonly affected, but any of the toes can be affected. If an ingrown toenail is not treated, it can become infected. What are the causes? This condition may be caused by: Wearing shoes that are too small or tight. An injury, such as stubbing your toe or having your toe stepped on. Improper cutting or care of your toenails. Having nail or foot abnormalities that were present from birth (congenital abnormalities), such as having a nail that is too big for your toe. What increases the risk? The following factors may make you more likely to develop ingrown toenails: Age. Nails tend to get thicker with age, so ingrown nails are more common among older people. Cutting your toenails incorrectly, such as cutting them very short or cutting them unevenly. An ingrown toenail is more likely to get infected if you have: Diabetes. Blood flow (circulation) problems. What are the signs or symptoms? Symptoms of an ingrown toenail may include: Pain, soreness, or tenderness. Redness. Swelling. Hardening of the skin that surrounds the toenail. Signs that an ingrown toenail may be infected include: Fluid or pus. Symptoms that get worse. How is this diagnosed? Ingrown toenails may be diagnosed based on: Your symptoms and medical history. A physical exam. Labs or tests. If you have fluid or blood coming from your toenail, a sample may be collected to test for the specific type of bacteria that is causing the infection. How is this treated? Treatment depends on the severity of your symptoms. You may be able to care for your toenail at home. If you have an infection, you may be prescribed antibiotic medicines. If you have fluid or pus draining from your toenail, your health care provider may drain it. If you have trouble walking, you may be  given crutches to use. If you have a severe or infected ingrown toenail, you may need a procedure to remove part or all of the nail. Follow these instructions at home: Foot care  Check your wound every day for signs of infection, or as often as told by your health care provider. Check for: More redness, swelling, or pain. More fluid or blood. Warmth. Pus or a bad smell. Do not pick at your toenail or try to remove it yourself. Soak your foot in warm, soapy water. Do this for 20 minutes, 3 times a day, or as often as told by your health care provider. This helps to keep your toe clean and your skin soft. Wear shoes that fit well and are not too tight. Your health care provider may recommend that you wear open-toed shoes while you heal. Trim your toenails regularly and carefully. Cut your toenails straight across to prevent injury to the skin at the corners of the toenail. Do not cut your nails in a curved shape. Keep your feet clean and dry to help prevent infection. General instructions Take over-the-counter and prescription medicines only as told by your health care provider. If you were prescribed an antibiotic, take it as told by your health care provider. Do not stop taking the antibiotic even if you start to feel better. If your health care provider told you to use crutches to help you move around, use them as instructed. Return to your normal activities as told by your health care provider. Ask your health care provider what activities are safe for you.  Keep all follow-up visits. This is important. Contact a health care provider if: You have more redness, swelling, pain, or other symptoms that do not improve with treatment. You have fluid, blood, or pus coming from your toenail. You have a red streak on your skin that starts at your foot and spreads up your leg. You have a fever. Summary An ingrown toenail occurs when the corner or sides of a toenail grow into the surrounding skin.  This causes discomfort and pain. The big toe is most commonly affected, but any of the toes can be affected. If an ingrown toenail is not treated, it can become infected. Fluid or pus draining from your toenail is a sign of infection. Your health care provider may need to drain it. You may be given antibiotics to treat the infection. Trimming your toenails regularly and properly can help you prevent an ingrown toenail. This information is not intended to replace advice given to you by your health care provider. Make sure you discuss any questions you have with your health care provider. Document Revised: 11/08/2020 Document Reviewed: 11/08/2020 Elsevier Patient Education  2024 [employer].

## 2023-12-20 ENCOUNTER — Ambulatory Visit (HOSPITAL_COMMUNITY)
Admission: RE | Admit: 2023-12-20 | Discharge: 2023-12-20 | Disposition: A | Discharge status: Home / Self Care | Source: Ambulatory Visit | Attending: Vascular Surgery | Admitting: Vascular Surgery

## 2023-12-20 DIAGNOSIS — Q796 Ehlers-Danlos syndrome, unspecified: Secondary | ICD-10-CM

## 2023-12-20 DIAGNOSIS — R002 Palpitations: Secondary | ICD-10-CM

## 2023-12-20 LAB — ECHOCARDIOGRAM COMPLETE
AR max vel: 1.57 cm2
AV Area VTI: 1.59 cm2
AV Area mean vel: 1.52 cm2
AV Mean grad: 2 mmHg
AV Peak grad: 3.7 mmHg
Ao pk vel: 0.96 m/s
Area-P 1/2: 5.13 cm2
S' Lateral: 2.83 cm

## 2023-12-21 ENCOUNTER — Ambulatory Visit: Payer: Self-pay | Admitting: Family Medicine

## 2023-12-21 DIAGNOSIS — Q796 Ehlers-Danlos syndrome, unspecified: Secondary | ICD-10-CM

## 2023-12-21 DIAGNOSIS — R002 Palpitations: Secondary | ICD-10-CM

## 2023-12-21 NOTE — Progress Notes (Signed)
 Echocardiogram looks normal.  There is no evidence of mitral valve prolapse or aorta aneurysm.  This is reassuring.

## 2023-12-23 NOTE — Progress Notes (Signed)
 Ben Jadie Allington D.[name] [address] [place] [address] Phone: [phone]   Assessment and Plan:     1. Neck pain 2. Chronic bilateral thoracic back pain 3. Chronic bilateral low back pain without sciatica 4. Somatic dysfunction of cervical region 5. Somatic dysfunction of thoracic region 6. Somatic dysfunction of lumbar region 7. Somatic dysfunction of pelvic region 8. Somatic dysfunction of rib region  -Chronic with exacerbation, subsequent visit - Continued multiple areas of musculoskeletal pain including bilateral hips, lower back, neck.  Patient with history of hypermobility, fibromyalgia, chronic musculoskeletal pains in multiple areas, and currently under workup for possible EDS - We discussed the patient's echocardiogram was unremarkable and reassuring.  Patient plans on discussing neck steps with Dr. Alease Hunter - Continue chronic pain medications including muscle relaxers and gabapentin  - Continue home exercises and physical therapy - Patient was interested in myself and Dr. Alease Hunter supporting long-term versus permanent disability.  I informed her that we do not do long-term or permanent disability at this office and that patient would need disability physician for these recommendations - Patient has received relief with OMT in the past.  Elects for repeat OMT today.  Tolerated well per note below. - Decision today to treat with OMT was based on Physical Exam   After verbal consent patient was treated with HVLA (high velocity low amplitude), ME (muscle energy), FPR (flex positional release), ST (soft tissue), PC/PD (Pelvic Compression/ Pelvic Decompression) techniques in cervical, rib, thoracic, lumbar, and pelvic areas. Patient tolerated the procedure well with improvement in symptoms.  Patient educated on potential side effects of soreness and recommended to rest, hydrate, and use Tylenol  as needed for pain control.   Pertinent previous records  reviewed include echocardiogram report 12/20/2023  Follow Up: 4 weeks for reevaluation.  Could consider repeat OMT if patient finds treatments beneficial   Subjective:   I, Moenique Parris, am serving as a Neurosurgeon for Doctor Ulysees Gander  Chief Complaint: OMT   HPI:    10/30/2023 Patient is a 38 year old female with EDS. Patient states recent diagnosis EDS. Fall right before christmas off of 6 ft ladder. Hx of back, knee, shoulder injuries. Whole back pain as of late. She feels crooked at the hips right hip especially. Back ,neck and shoulders are tight. She is in PT and they do dry needle and that has really helped.    11/27/2023 Patient states she is ready for tune up   12/24/2023 Patient states didn't get as much relied as the first visit. She is in flare now    Relevant Historical Information: Hypermobility, morbid obesity,  Additional pertinent review of systems negative.  Current Outpatient Medications  Medication Sig Dispense Refill   albuterol  (VENTOLIN  HFA) 108 (90 Base) MCG/ACT inhaler Inhale 2 puffs into the lungs every 4 (four) hours as needed for wheezing or shortness of breath. 1 each 3   Beclomethasone Dipropionate  (QNASL ) 80 MCG/ACT AERS Place 2 Pump into the nose daily as needed. 10.6 g 5   BLISOVI FE 1/20 1-20 MG-MCG tablet Take 1 tablet by mouth daily.     COVID-19 mRNA bivalent vaccine, Pfizer, (PFIZER COVID-19 VAC BIVALENT) injection Inject into the muscle. 0.3 mL 0   cyanocobalamin  (VITAMIN B12) 1000 MCG tablet Take by mouth.     cyclobenzaprine (FLEXERIL) 10 MG tablet TAKE 1 TABLET BY MOUTH THREE TIMES DAILY FOR 15 DAYS AS NEEDED FOR MUSCLE SPASMS     EPINEPHrine  0.3 mg/0.3 mL IJ SOAJ  injection Inject 0.3 mg into the muscle as needed for anaphylaxis. 1 each 2   fexofenadine  (ALLEGRA ) 180 MG tablet Take 1 tablet (180 mg total) by mouth daily. 90 tablet 1   FLUoxetine  (PROZAC ) 40 MG capsule Take 1 capsule (40 mg total) by mouth daily. 90 capsule 0   gabapentin   (NEURONTIN ) 300 MG capsule TAKE 1 CAPSULE BY MOUTH EVERY MORNING, 1 CAPSULE IN THE EVENING, AND 2 CAPSULES AT BEDTIME//Please call and make overdue appt for further refills 120 capsule 0   glycopyrrolate  (ROBINUL ) 1 MG tablet Take 1 tablet (1 mg total) by mouth 3 (three) times daily. 120 tablet 1   HYDROcodone -acetaminophen  (NORCO/VICODIN) 5-325 MG tablet Take 1 tablet by mouth every 6 (six) hours as needed.     hyoscyamine (LEVSIN SL) 0.125 MG SL tablet PLACE 1 TABLET UNDER THE TONGUE PRIOR TO EACH MEAL AND AT BEDTIME AS NEEDED     levocetirizine (XYZAL ) 5 MG tablet TAKE 1 TABLET(5 MG) BY MOUTH EVERY EVENING 90 tablet 1   Lisdexamfetamine Dimesylate  (VYVANSE ) 40 MG CHEW Chew 40 mg by mouth daily. 30 tablet 0   Lisdexamfetamine Dimesylate  (VYVANSE ) 40 MG CHEW Chew 40 mg by mouth daily at 12 noon. 30 tablet 0   methocarbamol  (ROBAXIN ) 500 MG tablet Take 1 tablet (500 mg total) by mouth 2 (two) times daily as needed for muscle spasms. 60 tablet 0   montelukast  (SINGULAIR ) 10 MG tablet TAKE 1 TABLET(10 MG) BY MOUTH AT BEDTIME. NEED APPOINTMENT FOR ADDITIONAL REFILLS. 90 tablet 1   norethindrone -ethinyl estradiol (LOESTRIN) 1-20 MG-MCG tablet Take 1 tablet by mouth daily.     nystatin  (MYCOSTATIN /NYSTOP ) powder Apply 1 Application topically 3 (three) times daily. Apply to affected area for up to 7 days 30 g 2   omeprazole (PRILOSEC) 20 MG capsule 1 capsule 30 minutes before morning meal     ondansetron  (ZOFRAN  ODT) 4 MG disintegrating tablet Take 1 tablet (4 mg total) by mouth every 8 (eight) hours as needed for nausea or vomiting. 20 tablet 0   prednisoLONE acetate (PRED FORTE) 1 % ophthalmic suspension INSTILL ONE DROP INTO BOTH EYES TWICE DAILY FOR 1 MONTH     Semaglutide -Weight Management 1 MG/0.5ML SOAJ Inject 1 mg into the skin once a week. 6 mL 0   Short Ragweed Pollen Ext (RAGWITEK) 12 AMB A 1-U SUBL Place 1 tablet under the tongue daily. Bring this prescription to your appointment and you will  take the first SL tablet in the clinic. 30 tablet 5   SYRINGE-NEEDLE, DISP, 3 ML (EASYPOINT NEEDLE/SYRINGE) 25G X 5/8" 3 ML MISC Use one needle/syringe to inject Vitamin B12 into the muscle once every 7 days. *Disp. whatever insurance will cover*     tirzepatide  (ZEPBOUND ) 10 MG/0.5ML Pen Inject 10 mg into the skin once a week. 2 mL 0   tirzepatide  (ZEPBOUND ) 12.5 MG/0.5ML Pen Inject 12.5 mg into the skin every 7 (seven) days. 2 mL 0   tirzepatide  (ZEPBOUND ) 15 MG/0.5ML Pen Inject 15 mg into the skin once a week. 2 mL 0   tirzepatide  (ZEPBOUND ) 5 MG/0.5ML injection vial Inject 5 mg into the skin once a week. 6 mL 0   tirzepatide  (ZEPBOUND ) 5 MG/0.5ML Pen Inject 5 mg into the skin once a week. 6 mL 0   tirzepatide  (ZEPBOUND ) 7.5 MG/0.5ML Pen Inject 7.5 mg into the skin once a week. 6 mL 0   traMADol (ULTRAM) 50 MG tablet TAKE 1 TABLET BY MOUTH THREE TIMES DAILY FOR 5 DAYS AS  NEEDED     traZODone (DESYREL) 100 MG tablet Take 100 mg by mouth at bedtime.     Vitamin D , Ergocalciferol , (DRISDOL ) 1.25 MG (50000 UNIT) CAPS capsule Take 1 capsule (50,000 Units total) by mouth every 7 (seven) days. 4 capsule 0   No current facility-administered medications for this visit.      Objective:     Vitals:   12/24/23 1558  Pulse: (!) 101  SpO2: 97%  Weight: 251 lb (113.9 kg)  Height: 5\' 1"  (1.549 m)      Body mass index is 47.43 kg/m.    Physical Exam:     General: Well-appearing, cooperative, sitting comfortably in no acute distress.   OMT Physical Exam:  ASIS Compression Test: Positive Right Cervical: TTP paraspinal, C3 RRSR Rib: Bilateral elevated first rib with TTP Thoracic: TTP paraspinal, T6-8 RRSL Lumbar: TTP paraspinal, L1-3 RRSL Pelvis: Right anterior innominate  Electronically signed by:  Marshall Skeeter D.[name] 4:20 PM 12/24/23

## 2023-12-24 ENCOUNTER — Ambulatory Visit (INDEPENDENT_AMBULATORY_CARE_PROVIDER_SITE_OTHER): Admitting: Sports Medicine

## 2023-12-24 VITALS — HR 101 | Ht 61.0 in | Wt 251.0 lb

## 2023-12-24 DIAGNOSIS — M545 Low back pain, unspecified: Secondary | ICD-10-CM

## 2023-12-24 DIAGNOSIS — M9905 Segmental and somatic dysfunction of pelvic region: Secondary | ICD-10-CM

## 2023-12-24 DIAGNOSIS — M542 Cervicalgia: Secondary | ICD-10-CM | POA: Diagnosis not present

## 2023-12-24 DIAGNOSIS — M9901 Segmental and somatic dysfunction of cervical region: Secondary | ICD-10-CM

## 2023-12-24 DIAGNOSIS — M9903 Segmental and somatic dysfunction of lumbar region: Secondary | ICD-10-CM

## 2023-12-24 DIAGNOSIS — G8929 Other chronic pain: Secondary | ICD-10-CM

## 2023-12-24 DIAGNOSIS — M546 Pain in thoracic spine: Secondary | ICD-10-CM | POA: Diagnosis not present

## 2023-12-24 DIAGNOSIS — M9902 Segmental and somatic dysfunction of thoracic region: Secondary | ICD-10-CM

## 2023-12-24 DIAGNOSIS — M9908 Segmental and somatic dysfunction of rib cage: Secondary | ICD-10-CM

## 2023-12-24 NOTE — Telephone Encounter (Signed)
 Forwarding to Dr. Denyse Amass to review and advise.

## 2023-12-25 ENCOUNTER — Ambulatory Visit: Attending: Family Medicine

## 2023-12-25 DIAGNOSIS — R002 Palpitations: Secondary | ICD-10-CM

## 2023-12-25 DIAGNOSIS — Q796 Ehlers-Danlos syndrome, unspecified: Secondary | ICD-10-CM

## 2023-12-25 NOTE — Progress Notes (Unsigned)
 EP to read.

## 2024-01-05 DIAGNOSIS — M47814 Spondylosis without myelopathy or radiculopathy, thoracic region: Secondary | ICD-10-CM | POA: Insufficient documentation

## 2024-01-10 ENCOUNTER — Other Ambulatory Visit (HOSPITAL_BASED_OUTPATIENT_CLINIC_OR_DEPARTMENT_OTHER): Payer: Self-pay

## 2024-01-10 MED ORDER — ZEPBOUND 10 MG/0.5ML ~~LOC~~ SOAJ
10.0000 mg | SUBCUTANEOUS | 0 refills | Status: DC
  Filled 2024-01-10: qty 6, 84d supply, fill #0

## 2024-01-14 ENCOUNTER — Other Ambulatory Visit (HOSPITAL_BASED_OUTPATIENT_CLINIC_OR_DEPARTMENT_OTHER): Payer: Self-pay

## 2024-01-14 NOTE — Progress Notes (Signed)
 Ben Talulah Schirmer D.[facility] [address] [place] [address] Phone: [phone]   Assessment and Plan:     1. Neck pain 2. Chronic bilateral thoracic back pain 3. Chronic bilateral low back pain without sciatica 4. Somatic dysfunction of cervical region 5. Somatic dysfunction of thoracic region 6. Somatic dysfunction of lumbar region 7. Somatic dysfunction of pelvic region 8. Somatic dysfunction of rib region  -Chronic with exacerbation, subsequent visit - Continued multiple areas of musculoskeletal pain, primarily in upper back with current flare over the past 1 to 2 weeks.  Overall improvement in neck, lower back and bilateral hip pain with conservative therapy. - Continue chronic pain medications including muscle relaxers and gabapentin  - Continue home exercises and physical therapy with goal of strengthening and stabilization - Patient was found to have severe facet arthrosis in T4-T8 which could contribute to chronic upper back pain.  She is pursuing medial branch block with radiofrequency ablation procedure with orthopedic surgery. - Patient has received relief with OMT in the past.  Elects for repeat OMT today.  Tolerated well per note below. - Decision today to treat with OMT was based on Physical Exam   After verbal consent patient was treated with HVLA (high velocity low amplitude), ME (muscle energy), FPR (flex positional release), ST (soft tissue), PC/PD (Pelvic Compression/ Pelvic Decompression) techniques in cervical, rib, thoracic, lumbar, and pelvic areas. Patient tolerated the procedure well with improvement in symptoms.  Patient educated on potential side effects of soreness and recommended to rest, hydrate, and use Tylenol  as needed for pain control.   Pertinent previous records reviewed include orthopedic surgery note 01/16/2024  Follow Up: 3 to 4 weeks for reevaluation.  Could consider repeat OMT   Subjective:   I, Moenique Parris, am  serving as a Neurosurgeon for Doctor Morene Mace  Chief Complaint: OMT   HPI:    10/30/2023 Patient is a 38 year old female with EDS. Patient states recent diagnosis EDS. Fall right before christmas off of 6 ft ladder. Hx of back, knee, shoulder injuries. Whole back pain as of late. She feels crooked at the hips right hip especially. Back ,neck and shoulders are tight. She is in PT and they do dry needle and that has really helped.    11/27/2023 Patient states she is ready for tune up    12/24/2023 Patient states didn't get as much relied as the first visit. She is in flare now   01/21/2024 Patient states she is okay. Back is flared she is only comfortable laying on her side    Relevant Historical Information: Hypermobility, morbid obesity,    Additional pertinent review of systems negative.  Current Outpatient Medications  Medication Sig Dispense Refill   albuterol  (VENTOLIN  HFA) 108 (90 Base) MCG/ACT inhaler Inhale 2 puffs into the lungs every 4 (four) hours as needed for wheezing or shortness of breath. 1 each 3   Beclomethasone Dipropionate  (QNASL ) 80 MCG/ACT AERS Place 2 Pump into the nose daily as needed. 10.6 g 5   BLISOVI FE 1/20 1-20 MG-MCG tablet Take 1 tablet by mouth daily.     COVID-19 mRNA bivalent vaccine, Pfizer, (PFIZER COVID-19 VAC BIVALENT) injection Inject into the muscle. 0.3 mL 0   cyanocobalamin  (VITAMIN B12) 1000 MCG tablet Take by mouth.     cyclobenzaprine (FLEXERIL) 10 MG tablet TAKE 1 TABLET BY MOUTH THREE TIMES DAILY FOR 15 DAYS AS NEEDED FOR MUSCLE SPASMS     EPINEPHrine  0.3 mg/0.3 mL IJ  SOAJ injection Inject 0.3 mg into the muscle as needed for anaphylaxis. 1 each 2   fexofenadine  (ALLEGRA ) 180 MG tablet Take 1 tablet (180 mg total) by mouth daily. 90 tablet 1   FLUoxetine  (PROZAC ) 40 MG capsule Take 1 capsule (40 mg total) by mouth daily. 90 capsule 0   gabapentin  (NEURONTIN ) 300 MG capsule TAKE 1 CAPSULE BY MOUTH EVERY MORNING, 1 CAPSULE IN THE EVENING, AND 2  CAPSULES AT BEDTIME//Please call and make overdue appt for further refills 120 capsule 0   glycopyrrolate  (ROBINUL ) 1 MG tablet Take 1 tablet (1 mg total) by mouth 3 (three) times daily. 120 tablet 1   HYDROcodone -acetaminophen  (NORCO/VICODIN) 5-325 MG tablet Take 1 tablet by mouth every 6 (six) hours as needed.     hyoscyamine (LEVSIN SL) 0.125 MG SL tablet PLACE 1 TABLET UNDER THE TONGUE PRIOR TO EACH MEAL AND AT BEDTIME AS NEEDED     levocetirizine (XYZAL ) 5 MG tablet TAKE 1 TABLET(5 MG) BY MOUTH EVERY EVENING 90 tablet 1   Lisdexamfetamine Dimesylate  (VYVANSE ) 40 MG CHEW Chew 40 mg by mouth daily. 30 tablet 0   Lisdexamfetamine Dimesylate  (VYVANSE ) 40 MG CHEW Chew 40 mg by mouth daily at 12 noon. 30 tablet 0   methocarbamol  (ROBAXIN ) 500 MG tablet Take 1 tablet (500 mg total) by mouth 2 (two) times daily as needed for muscle spasms. 60 tablet 0   montelukast  (SINGULAIR ) 10 MG tablet TAKE 1 TABLET(10 MG) BY MOUTH AT BEDTIME. NEED APPOINTMENT FOR ADDITIONAL REFILLS. 90 tablet 1   norethindrone -ethinyl estradiol (LOESTRIN) 1-20 MG-MCG tablet Take 1 tablet by mouth daily.     nystatin  (MYCOSTATIN /NYSTOP ) powder Apply 1 Application topically 3 (three) times daily. Apply to affected area for up to 7 days 30 g 2   omeprazole (PRILOSEC) 20 MG capsule 1 capsule 30 minutes before morning meal     ondansetron  (ZOFRAN  ODT) 4 MG disintegrating tablet Take 1 tablet (4 mg total) by mouth every 8 (eight) hours as needed for nausea or vomiting. 20 tablet 0   prednisoLONE acetate (PRED FORTE) 1 % ophthalmic suspension INSTILL ONE DROP INTO BOTH EYES TWICE DAILY FOR 1 MONTH     Semaglutide -Weight Management 1 MG/0.5ML SOAJ Inject 1 mg into the skin once a week. 6 mL 0   Short Ragweed Pollen Ext (RAGWITEK) 12 AMB A 1-U SUBL Place 1 tablet under the tongue daily. Bring this prescription to your appointment and you will take the first SL tablet in the clinic. 30 tablet 5   SYRINGE-NEEDLE, DISP, 3 ML (EASYPOINT  NEEDLE/SYRINGE) 25G X 5/8 3 ML MISC Use one needle/syringe to inject Vitamin B12 into the muscle once every 7 days. *Disp. whatever insurance will cover*     tirzepatide  (ZEPBOUND ) 10 MG/0.5ML Pen Inject 10 mg into the skin once a week. (Patient not taking: Reported on 01/16/2024) 2 mL 0   tirzepatide  (ZEPBOUND ) 10 MG/0.5ML Pen Inject 10 mg into the skin every 7 (seven) days. (Patient not taking: Reported on 01/16/2024) 6 mL 0   tirzepatide  (ZEPBOUND ) 12.5 MG/0.5ML Pen Inject 12.5 mg into the skin every 7 (seven) days. (Patient not taking: Reported on 01/16/2024) 2 mL 0   tirzepatide  (ZEPBOUND ) 15 MG/0.5ML Pen Inject 15 mg into the skin once a week. (Patient not taking: Reported on 01/16/2024) 2 mL 0   tirzepatide  (ZEPBOUND ) 5 MG/0.5ML injection vial Inject 5 mg into the skin once a week. (Patient not taking: Reported on 01/16/2024) 6 mL 0   tirzepatide  (ZEPBOUND ) 5 MG/0.5ML  Pen Inject 5 mg into the skin once a week. (Patient not taking: Reported on 01/16/2024) 6 mL 0   tirzepatide  (ZEPBOUND ) 7.5 MG/0.5ML Pen Inject 7.5 mg into the skin once a week. 6 mL 0   traMADol (ULTRAM) 50 MG tablet TAKE 1 TABLET BY MOUTH THREE TIMES DAILY FOR 5 DAYS AS NEEDED     traZODone (DESYREL) 100 MG tablet Take 100 mg by mouth at bedtime.     Vitamin D , Ergocalciferol , (DRISDOL ) 1.25 MG (50000 UNIT) CAPS capsule Take 1 capsule (50,000 Units total) by mouth every 7 (seven) days. 4 capsule 0   No current facility-administered medications for this visit.      Objective:     Vitals:   01/21/24 1324  Pulse: (!) 123  SpO2: 98%  Weight: 252 lb (114.3 kg)  Height: 5' 2 (1.575 m)      Body mass index is 46.09 kg/m.    Physical Exam:     General: Well-appearing, cooperative, sitting comfortably in no acute distress.   OMT Physical Exam:  ASIS Compression Test: Positive Right Cervical: TTP paraspinal, C3-5 RLSL Rib: Bilateral elevated first rib with TTP Thoracic: TTP paraspinal, T4-6 RRSL Lumbar: TTP paraspinal,  L1-3 RRSL Pelvis: Right anterior innominate  Electronically signed by:  Odis Mace D.[facility] 1:57 PM 01/21/24

## 2024-01-16 ENCOUNTER — Ambulatory Visit (INDEPENDENT_AMBULATORY_CARE_PROVIDER_SITE_OTHER): Admitting: Obstetrics and Gynecology

## 2024-01-16 ENCOUNTER — Encounter: Payer: Self-pay | Admitting: Obstetrics and Gynecology

## 2024-01-16 VITALS — BP 125/90 | HR 130 | Ht 62.0 in | Wt 252.0 lb

## 2024-01-16 DIAGNOSIS — Q524 Other congenital malformations of vagina: Secondary | ICD-10-CM | POA: Diagnosis not present

## 2024-01-16 DIAGNOSIS — M25511 Pain in right shoulder: Secondary | ICD-10-CM | POA: Insufficient documentation

## 2024-01-16 DIAGNOSIS — Z1331 Encounter for screening for depression: Secondary | ICD-10-CM | POA: Diagnosis not present

## 2024-01-16 NOTE — Progress Notes (Signed)
   GYNECOLOGY OFFICE VISIT NOTE  History:  Cynthia [name] is a 38 y.o. G0P0000 here today for 2nd opinion on the cyst found incidentally on exam by Dr. Nikki.  It doesn't cause her pain or pressure symptoms.   She does note a history of painful tampon removal. They would get caught with removal. Also notes pain with intercourse.   She always wanted children - isn't sure it will happen as she is 43 and currently single.    She had an MRI for the cyst after her US . I reviewed her US  which showed proteinaceous fluid filled cyst near the cervix. I reviewed her MRI images which shows a cyst just lateral to the cervix. The measurements from both imaging are largely concordant - it measures about 3-3.5 cm.   She has heavy periods but manages them. She was recently diagnosed with Ehlers-Danlos.   The following portions of the patient's history were reviewed and updated as appropriate: allergies, current medications, past family history, past medical history, past social history, past surgical history and problem list.   Health Maintenance:   Normal pap and negative HRHPV:    Diagnosis  Date Value Ref Range Status  10/31/2023   Final   - Negative for intraepithelial lesion or malignancy (NILM)   Review of Systems:  Pertinent items noted in HPI and remainder of comprehensive ROS otherwise negative.  Physical Exam:  BP (!) 125/90   Pulse (!) 130   Ht 5' 2 (1.575 m)   Wt 252 lb (114.3 kg)   LMP 01/06/2024   BMI 46.09 kg/m  CONSTITUTIONAL: Well-developed, well-nourished female in no acute distress.  HEENT:  Normocephalic, atraumatic. External right and left ear normal. No scleral icterus.  NECK: Normal range of motion, supple, no masses noted on observation SKIN: No rash noted. Not diaphoretic. No erythema. No pallor. MUSCULOSKELETAL: Normal range of motion. No edema noted. NEUROLOGIC: Alert and oriented to person, place, and time. Normal muscle tone coordination. No cranial nerve deficit  noted. PSYCHIATRIC: Normal mood and affect. Normal behavior. Normal judgment and thought content.  PELVIC: Normal EFG. Septate hymen with thin stalk. Cervix is normal. To the right of the cervix, the Gartner's cyst is able to be visualized and consistent with imaging size. The cervix is distinctly separate from the cyst.   Labs and Imaging No results found for this or any previous visit (from the past week).   Assessment and Plan:  1. Septate hymen (Primary) Discussed option for removal. Stalk is thin so would be possible to do in the office. Reviewed what process would improve. She will consider and make an appointment if needed.   2. Gartner's duct cyst Most likely diagnosis is Gartner's duct cyst as cervix was seen visually separately in person and by MRI imaging.  Recommended no intervention at this time - benign and otherwise asymptomatic.  Reviewed risk of drainage is primarily that of recurrence. Discussed risk of removal would be injury to important adjacent structures I.e. blood vessels, ureters and bladder.   Routine preventative health maintenance measures emphasized. Please refer to After Visit Summary for other counseling recommendations.   Return if symptoms worsen or fail to improve.  Vina Solian, MD, FACOG Obstetrician & Gynecologist, [facility] for [facility], [facility]

## 2024-01-17 ENCOUNTER — Telehealth: Payer: Self-pay | Admitting: *Deleted

## 2024-01-17 NOTE — Telephone Encounter (Signed)
 Left patient a message to call and schedule procedure visit with Dr. Cleatus on 07/28 or 03/04/24.

## 2024-01-21 ENCOUNTER — Ambulatory Visit: Admitting: Sports Medicine

## 2024-01-21 VITALS — HR 123 | Ht 62.0 in | Wt 252.0 lb

## 2024-01-21 DIAGNOSIS — M9901 Segmental and somatic dysfunction of cervical region: Secondary | ICD-10-CM

## 2024-01-21 DIAGNOSIS — M546 Pain in thoracic spine: Secondary | ICD-10-CM | POA: Diagnosis not present

## 2024-01-21 DIAGNOSIS — M9908 Segmental and somatic dysfunction of rib cage: Secondary | ICD-10-CM

## 2024-01-21 DIAGNOSIS — G8929 Other chronic pain: Secondary | ICD-10-CM

## 2024-01-21 DIAGNOSIS — M9905 Segmental and somatic dysfunction of pelvic region: Secondary | ICD-10-CM

## 2024-01-21 DIAGNOSIS — M545 Low back pain, unspecified: Secondary | ICD-10-CM | POA: Diagnosis not present

## 2024-01-21 DIAGNOSIS — M542 Cervicalgia: Secondary | ICD-10-CM | POA: Diagnosis not present

## 2024-01-21 DIAGNOSIS — M9903 Segmental and somatic dysfunction of lumbar region: Secondary | ICD-10-CM

## 2024-01-21 DIAGNOSIS — M9902 Segmental and somatic dysfunction of thoracic region: Secondary | ICD-10-CM

## 2024-01-28 ENCOUNTER — Encounter: Payer: Self-pay | Admitting: Physician Assistant

## 2024-01-28 ENCOUNTER — Ambulatory Visit (INDEPENDENT_AMBULATORY_CARE_PROVIDER_SITE_OTHER): Admitting: Physician Assistant

## 2024-01-28 VITALS — BP 113/77 | HR 100

## 2024-01-28 DIAGNOSIS — L74519 Primary focal hyperhidrosis, unspecified: Secondary | ICD-10-CM | POA: Diagnosis not present

## 2024-01-28 MED ORDER — GLYCOPYRROLATE 1 MG PO TABS: 1.0000 mg | ORAL_TABLET | Freq: Three times a day (TID) | ORAL | 3 refills | Status: DC

## 2024-01-28 NOTE — Patient Instructions (Signed)

## 2024-01-28 NOTE — Progress Notes (Signed)
   Follow-Up Visit   Subjective  Cynthia [name] is a 38 y.o. female who presents for the following:  Hyperhidrosis.  Pt was given Robinul  3 mg on 12/17/23 and reports this to have greatly helped her Hyperhidrosis. Some dry mouth in the beginning. Definitely has changed her life for the better.    The following portions of the chart were reviewed this encounter and updated as appropriate: medications, allergies, medical history  Review of Systems:  No other skin or systemic complaints except as noted in HPI or Assessment and Plan.  Objective  Well appearing patient in no apparent distress; mood and affect are within normal limits.  A focused examination was performed of the following areas: Patient declined examination today.   Relevant exam findings are noted in the Assessment and Plan.    Assessment & Plan   HYPERHIDROSIS - WELL CONTROLLED.   Treatment Plan: Continue robinul  1-3 mg daily.   PRIMARY FOCAL HYPERHIDROSIS   Related Medications glycopyrrolate  (ROBINUL ) 1 MG tablet Take 1 tablet (1 mg total) by mouth 3 (three) times daily.  Return in about 6 months (around 07/30/2024) for hyperhidrosis.  I, Darice Smock, CMA, am acting as scribe for [employer], PA-C.   Documentation: I have reviewed the above documentation for accuracy and completeness, and I agree with the above.  Cynthia Ende K, PA-C

## 2024-02-04 NOTE — Progress Notes (Deleted)
 Ben Jackson D.[facility] [address] [place] [address] Phone: [phone]   Assessment and Plan:     There are no diagnoses linked to this encounter.  *** - Patient has received relief with OMT in the past.  Elects for repeat OMT today.  Tolerated well per note below. - Decision today to treat with OMT was based on Physical Exam   After verbal consent patient was treated with HVLA (high velocity low amplitude), ME (muscle energy), FPR (flex positional release), ST (soft tissue), PC/PD (Pelvic Compression/ Pelvic Decompression) techniques in cervical, rib, thoracic, lumbar, and pelvic areas. Patient tolerated the procedure well with improvement in symptoms.  Patient educated on potential side effects of soreness and recommended to rest, hydrate, and use Tylenol  as needed for pain control.   Pertinent previous records reviewed include ***    Follow Up: ***     Subjective:   I, Mitsuo Budnick, am serving as a Neurosurgeon for Doctor Morene Mace  Chief Complaint: OMT   HPI:    10/30/2023 Patient is a 38 year old female with EDS. Patient states recent diagnosis EDS. Fall right before christmas off of 6 ft ladder. Hx of back, knee, shoulder injuries. Whole back pain as of late. She feels crooked at the hips right hip especially. Back ,neck and shoulders are tight. She is in PT and they do dry needle and that has really helped.    11/27/2023 Patient states she is ready for tune up    12/24/2023 Patient states didn't get as much relied as the first visit. She is in flare now    01/21/2024 Patient states she is okay. Back is flared she is only comfortable laying on her side    02/05/2024 Patient states   Relevant Historical Information: Hypermobility, morbid obesity,  Additional pertinent review of systems negative.  Current Outpatient Medications  Medication Sig Dispense Refill   albuterol  (VENTOLIN  HFA) 108 (90 Base) MCG/ACT inhaler Inhale 2 puffs  into the lungs every 4 (four) hours as needed for wheezing or shortness of breath. 1 each 3   Beclomethasone Dipropionate  (QNASL ) 80 MCG/ACT AERS Place 2 Pump into the nose daily as needed. 10.6 g 5   BLISOVI FE 1/20 1-20 MG-MCG tablet Take 1 tablet by mouth daily.     COVID-19 mRNA bivalent vaccine, Pfizer, (PFIZER COVID-19 VAC BIVALENT) injection Inject into the muscle. 0.3 mL 0   cyanocobalamin  (VITAMIN B12) 1000 MCG tablet Take by mouth.     cyclobenzaprine (FLEXERIL) 10 MG tablet TAKE 1 TABLET BY MOUTH THREE TIMES DAILY FOR 15 DAYS AS NEEDED FOR MUSCLE SPASMS     EPINEPHrine  0.3 mg/0.3 mL IJ SOAJ injection Inject 0.3 mg into the muscle as needed for anaphylaxis. 1 each 2   fexofenadine  (ALLEGRA ) 180 MG tablet Take 1 tablet (180 mg total) by mouth daily. 90 tablet 1   FLUoxetine  (PROZAC ) 40 MG capsule Take 1 capsule (40 mg total) by mouth daily. 90 capsule 0   gabapentin  (NEURONTIN ) 300 MG capsule TAKE 1 CAPSULE BY MOUTH EVERY MORNING, 1 CAPSULE IN THE EVENING, AND 2 CAPSULES AT BEDTIME//Please call and make overdue appt for further refills 120 capsule 0   glycopyrrolate  (ROBINUL ) 1 MG tablet Take 1 tablet (1 mg total) by mouth 3 (three) times daily. 120 tablet 1   glycopyrrolate  (ROBINUL ) 1 MG tablet Take 1 tablet (1 mg total) by mouth 3 (three) times daily. 270 tablet 3   HYDROcodone -acetaminophen  (NORCO/VICODIN) 5-325 MG tablet  Take 1 tablet by mouth every 6 (six) hours as needed.     hyoscyamine (LEVSIN SL) 0.125 MG SL tablet PLACE 1 TABLET UNDER THE TONGUE PRIOR TO EACH MEAL AND AT BEDTIME AS NEEDED     levocetirizine (XYZAL ) 5 MG tablet TAKE 1 TABLET(5 MG) BY MOUTH EVERY EVENING 90 tablet 1   Lisdexamfetamine Dimesylate  (VYVANSE ) 40 MG CHEW Chew 40 mg by mouth daily. 30 tablet 0   Lisdexamfetamine Dimesylate  (VYVANSE ) 40 MG CHEW Chew 40 mg by mouth daily at 12 noon. 30 tablet 0   methocarbamol  (ROBAXIN ) 500 MG tablet Take 1 tablet (500 mg total) by mouth 2 (two) times daily as needed for  muscle spasms. 60 tablet 0   montelukast  (SINGULAIR ) 10 MG tablet TAKE 1 TABLET(10 MG) BY MOUTH AT BEDTIME. NEED APPOINTMENT FOR ADDITIONAL REFILLS. 90 tablet 1   norethindrone -ethinyl estradiol (LOESTRIN) 1-20 MG-MCG tablet Take 1 tablet by mouth daily.     nystatin  (MYCOSTATIN /NYSTOP ) powder Apply 1 Application topically 3 (three) times daily. Apply to affected area for up to 7 days 30 g 2   omeprazole (PRILOSEC) 20 MG capsule 1 capsule 30 minutes before morning meal     ondansetron  (ZOFRAN  ODT) 4 MG disintegrating tablet Take 1 tablet (4 mg total) by mouth every 8 (eight) hours as needed for nausea or vomiting. 20 tablet 0   prednisoLONE acetate (PRED FORTE) 1 % ophthalmic suspension INSTILL ONE DROP INTO BOTH EYES TWICE DAILY FOR 1 MONTH     Semaglutide -Weight Management 1 MG/0.5ML SOAJ Inject 1 mg into the skin once a week. 6 mL 0   Short Ragweed Pollen Ext (RAGWITEK) 12 AMB A 1-U SUBL Place 1 tablet under the tongue daily. Bring this prescription to your appointment and you will take the first SL tablet in the clinic. 30 tablet 5   SYRINGE-NEEDLE, DISP, 3 ML (EASYPOINT NEEDLE/SYRINGE) 25G X 5/8 3 ML MISC Use one needle/syringe to inject Vitamin B12 into the muscle once every 7 days. *Disp. whatever insurance will cover*     tirzepatide  (ZEPBOUND ) 10 MG/0.5ML Pen Inject 10 mg into the skin once a week. (Patient not taking: Reported on 01/16/2024) 2 mL 0   tirzepatide  (ZEPBOUND ) 10 MG/0.5ML Pen Inject 10 mg into the skin every 7 (seven) days. (Patient not taking: Reported on 01/16/2024) 6 mL 0   tirzepatide  (ZEPBOUND ) 12.5 MG/0.5ML Pen Inject 12.5 mg into the skin every 7 (seven) days. (Patient not taking: Reported on 01/16/2024) 2 mL 0   tirzepatide  (ZEPBOUND ) 15 MG/0.5ML Pen Inject 15 mg into the skin once a week. (Patient not taking: Reported on 01/16/2024) 2 mL 0   tirzepatide  (ZEPBOUND ) 5 MG/0.5ML injection vial Inject 5 mg into the skin once a week. (Patient not taking: Reported on 01/16/2024) 6  mL 0   tirzepatide  (ZEPBOUND ) 5 MG/0.5ML Pen Inject 5 mg into the skin once a week. (Patient not taking: Reported on 01/16/2024) 6 mL 0   tirzepatide  (ZEPBOUND ) 7.5 MG/0.5ML Pen Inject 7.5 mg into the skin once a week. 6 mL 0   traMADol (ULTRAM) 50 MG tablet TAKE 1 TABLET BY MOUTH THREE TIMES DAILY FOR 5 DAYS AS NEEDED     traZODone (DESYREL) 100 MG tablet Take 100 mg by mouth at bedtime.     Vitamin D , Ergocalciferol , (DRISDOL ) 1.25 MG (50000 UNIT) CAPS capsule Take 1 capsule (50,000 Units total) by mouth every 7 (seven) days. 4 capsule 0   No current facility-administered medications for this visit.  Objective:     There were no vitals filed for this visit.    There is no height or weight on file to calculate BMI.    Physical Exam:     General: Well-appearing, cooperative, sitting comfortably in no acute distress.   OMT Physical Exam:  ASIS Compression Test: Positive Right Cervical: TTP paraspinal, *** Rib: Bilateral elevated first rib with TTP Thoracic: TTP paraspinal,*** Lumbar: TTP paraspinal,*** Pelvis: Right anterior innominate  Electronically signed by:  Odis Mace D.[facility] 7:45 AM 02/04/24

## 2024-02-05 ENCOUNTER — Ambulatory Visit: Admitting: Sports Medicine

## 2024-02-11 NOTE — Progress Notes (Unsigned)
 Ben Jackson D.[facility] [address] [place] [address] Phone: [phone]   Assessment and Plan:     There are no diagnoses linked to this encounter.  *** - Patient has received relief with OMT in the past.  Elects for repeat OMT today.  Tolerated well per note below. - Decision today to treat with OMT was based on Physical Exam   After verbal consent patient was treated with HVLA (high velocity low amplitude), ME (muscle energy), FPR (flex positional release), ST (soft tissue), PC/PD (Pelvic Compression/ Pelvic Decompression) techniques in cervical, rib, thoracic, lumbar, and pelvic areas. Patient tolerated the procedure well with improvement in symptoms.  Patient educated on potential side effects of soreness and recommended to rest, hydrate, and use Tylenol  as needed for pain control.   Pertinent previous records reviewed include ***    Follow Up: ***     Subjective:   I, Omri Bertran, am serving as a Neurosurgeon for Doctor Morene Mace  Chief Complaint: OMT   HPI:    10/30/2023 Patient is a 38 year old female with EDS. Patient states recent diagnosis EDS. Fall right before christmas off of 6 ft ladder. Hx of back, knee, shoulder injuries. Whole back pain as of late. She feels crooked at the hips right hip especially. Back ,neck and shoulders are tight. She is in PT and they do dry needle and that has really helped.    11/27/2023 Patient states she is ready for tune up    12/24/2023 Patient states didn't get as much relied as the first visit. She is in flare now    01/21/2024 Patient states she is okay. Back is flared she is only comfortable laying on her side    02/12/2024 Patient states   Relevant Historical Information: Hypermobility, morbid obesity,  Additional pertinent review of systems negative.  Current Outpatient Medications  Medication Sig Dispense Refill   albuterol  (VENTOLIN  HFA) 108 (90 Base) MCG/ACT inhaler Inhale 2 puffs  into the lungs every 4 (four) hours as needed for wheezing or shortness of breath. 1 each 3   Beclomethasone Dipropionate  (QNASL ) 80 MCG/ACT AERS Place 2 Pump into the nose daily as needed. 10.6 g 5   BLISOVI FE 1/20 1-20 MG-MCG tablet Take 1 tablet by mouth daily.     COVID-19 mRNA bivalent vaccine, Pfizer, (PFIZER COVID-19 VAC BIVALENT) injection Inject into the muscle. 0.3 mL 0   cyanocobalamin  (VITAMIN B12) 1000 MCG tablet Take by mouth.     cyclobenzaprine (FLEXERIL) 10 MG tablet TAKE 1 TABLET BY MOUTH THREE TIMES DAILY FOR 15 DAYS AS NEEDED FOR MUSCLE SPASMS     EPINEPHrine  0.3 mg/0.3 mL IJ SOAJ injection Inject 0.3 mg into the muscle as needed for anaphylaxis. 1 each 2   fexofenadine  (ALLEGRA ) 180 MG tablet Take 1 tablet (180 mg total) by mouth daily. 90 tablet 1   FLUoxetine  (PROZAC ) 40 MG capsule Take 1 capsule (40 mg total) by mouth daily. 90 capsule 0   gabapentin  (NEURONTIN ) 300 MG capsule TAKE 1 CAPSULE BY MOUTH EVERY MORNING, 1 CAPSULE IN THE EVENING, AND 2 CAPSULES AT BEDTIME//Please call and make overdue appt for further refills 120 capsule 0   glycopyrrolate  (ROBINUL ) 1 MG tablet Take 1 tablet (1 mg total) by mouth 3 (three) times daily. 120 tablet 1   glycopyrrolate  (ROBINUL ) 1 MG tablet Take 1 tablet (1 mg total) by mouth 3 (three) times daily. 270 tablet 3   HYDROcodone -acetaminophen  (NORCO/VICODIN) 5-325 MG tablet  Take 1 tablet by mouth every 6 (six) hours as needed.     hyoscyamine (LEVSIN SL) 0.125 MG SL tablet PLACE 1 TABLET UNDER THE TONGUE PRIOR TO EACH MEAL AND AT BEDTIME AS NEEDED     levocetirizine (XYZAL ) 5 MG tablet TAKE 1 TABLET(5 MG) BY MOUTH EVERY EVENING 90 tablet 1   Lisdexamfetamine Dimesylate  (VYVANSE ) 40 MG CHEW Chew 40 mg by mouth daily. 30 tablet 0   Lisdexamfetamine Dimesylate  (VYVANSE ) 40 MG CHEW Chew 40 mg by mouth daily at 12 noon. 30 tablet 0   methocarbamol  (ROBAXIN ) 500 MG tablet Take 1 tablet (500 mg total) by mouth 2 (two) times daily as needed for  muscle spasms. 60 tablet 0   montelukast  (SINGULAIR ) 10 MG tablet TAKE 1 TABLET(10 MG) BY MOUTH AT BEDTIME. NEED APPOINTMENT FOR ADDITIONAL REFILLS. 90 tablet 1   norethindrone -ethinyl estradiol (LOESTRIN) 1-20 MG-MCG tablet Take 1 tablet by mouth daily.     nystatin  (MYCOSTATIN /NYSTOP ) powder Apply 1 Application topically 3 (three) times daily. Apply to affected area for up to 7 days 30 g 2   omeprazole (PRILOSEC) 20 MG capsule 1 capsule 30 minutes before morning meal     ondansetron  (ZOFRAN  ODT) 4 MG disintegrating tablet Take 1 tablet (4 mg total) by mouth every 8 (eight) hours as needed for nausea or vomiting. 20 tablet 0   prednisoLONE acetate (PRED FORTE) 1 % ophthalmic suspension INSTILL ONE DROP INTO BOTH EYES TWICE DAILY FOR 1 MONTH     Semaglutide -Weight Management 1 MG/0.5ML SOAJ Inject 1 mg into the skin once a week. 6 mL 0   Short Ragweed Pollen Ext (RAGWITEK) 12 AMB A 1-U SUBL Place 1 tablet under the tongue daily. Bring this prescription to your appointment and you will take the first SL tablet in the clinic. 30 tablet 5   SYRINGE-NEEDLE, DISP, 3 ML (EASYPOINT NEEDLE/SYRINGE) 25G X 5/8 3 ML MISC Use one needle/syringe to inject Vitamin B12 into the muscle once every 7 days. *Disp. whatever insurance will cover*     tirzepatide  (ZEPBOUND ) 10 MG/0.5ML Pen Inject 10 mg into the skin once a week. (Patient not taking: Reported on 01/16/2024) 2 mL 0   tirzepatide  (ZEPBOUND ) 10 MG/0.5ML Pen Inject 10 mg into the skin every 7 (seven) days. (Patient not taking: Reported on 01/16/2024) 6 mL 0   tirzepatide  (ZEPBOUND ) 12.5 MG/0.5ML Pen Inject 12.5 mg into the skin every 7 (seven) days. (Patient not taking: Reported on 01/16/2024) 2 mL 0   tirzepatide  (ZEPBOUND ) 15 MG/0.5ML Pen Inject 15 mg into the skin once a week. (Patient not taking: Reported on 01/16/2024) 2 mL 0   tirzepatide  (ZEPBOUND ) 5 MG/0.5ML injection vial Inject 5 mg into the skin once a week. (Patient not taking: Reported on 01/16/2024) 6  mL 0   tirzepatide  (ZEPBOUND ) 5 MG/0.5ML Pen Inject 5 mg into the skin once a week. (Patient not taking: Reported on 01/16/2024) 6 mL 0   tirzepatide  (ZEPBOUND ) 7.5 MG/0.5ML Pen Inject 7.5 mg into the skin once a week. 6 mL 0   traMADol (ULTRAM) 50 MG tablet TAKE 1 TABLET BY MOUTH THREE TIMES DAILY FOR 5 DAYS AS NEEDED     traZODone (DESYREL) 100 MG tablet Take 100 mg by mouth at bedtime.     Vitamin D , Ergocalciferol , (DRISDOL ) 1.25 MG (50000 UNIT) CAPS capsule Take 1 capsule (50,000 Units total) by mouth every 7 (seven) days. 4 capsule 0   No current facility-administered medications for this visit.  Objective:     There were no vitals filed for this visit.    There is no height or weight on file to calculate BMI.    Physical Exam:     General: Well-appearing, cooperative, sitting comfortably in no acute distress.   OMT Physical Exam:  ASIS Compression Test: Positive Right Cervical: TTP paraspinal, *** Rib: Bilateral elevated first rib with TTP Thoracic: TTP paraspinal,*** Lumbar: TTP paraspinal,*** Pelvis: Right anterior innominate  Electronically signed by:  Odis Mace D[facility] 7:43 AM 02/11/24

## 2024-02-12 ENCOUNTER — Ambulatory Visit (INDEPENDENT_AMBULATORY_CARE_PROVIDER_SITE_OTHER): Admitting: Sports Medicine

## 2024-02-12 VITALS — HR 117 | Ht 62.0 in | Wt 252.0 lb

## 2024-02-12 DIAGNOSIS — M9903 Segmental and somatic dysfunction of lumbar region: Secondary | ICD-10-CM | POA: Diagnosis not present

## 2024-02-12 DIAGNOSIS — M545 Low back pain, unspecified: Secondary | ICD-10-CM | POA: Diagnosis not present

## 2024-02-12 DIAGNOSIS — M542 Cervicalgia: Secondary | ICD-10-CM | POA: Diagnosis not present

## 2024-02-12 DIAGNOSIS — G8929 Other chronic pain: Secondary | ICD-10-CM

## 2024-02-12 DIAGNOSIS — M9905 Segmental and somatic dysfunction of pelvic region: Secondary | ICD-10-CM

## 2024-02-12 DIAGNOSIS — M9901 Segmental and somatic dysfunction of cervical region: Secondary | ICD-10-CM | POA: Diagnosis not present

## 2024-02-12 DIAGNOSIS — M546 Pain in thoracic spine: Secondary | ICD-10-CM

## 2024-02-12 DIAGNOSIS — M9902 Segmental and somatic dysfunction of thoracic region: Secondary | ICD-10-CM | POA: Diagnosis not present

## 2024-02-12 DIAGNOSIS — M9908 Segmental and somatic dysfunction of rib cage: Secondary | ICD-10-CM

## 2024-02-17 ENCOUNTER — Telehealth: Payer: Self-pay

## 2024-02-17 ENCOUNTER — Other Ambulatory Visit: Payer: Self-pay | Admitting: Family Medicine

## 2024-02-17 ENCOUNTER — Encounter: Payer: Self-pay | Admitting: Family Medicine

## 2024-02-17 MED ORDER — PREDNISONE 5 MG (48) PO TBPK: ORAL_TABLET | ORAL | 0 refills | Status: AC

## 2024-02-17 MED ORDER — FAMOTIDINE 20 MG PO TABS: 20.0000 mg | ORAL_TABLET | Freq: Two times a day (BID) | ORAL | 1 refills | Status: DC

## 2024-02-17 NOTE — Telephone Encounter (Signed)
 Pt called reporting what sounds like an allergic reaction to the adhesive from the heart monitor. Started the heart monitor Thurs AM and had to take it off Friday PM. She notes round burn-like marks w/ a rash around the area. The area is very itchy and has spread up onto her neck. She notes redness seems worse today. Has been treating w/ hydrocortisone cream. She notes a similar reaction years ago from the adhesive follow knee surgery

## 2024-02-17 NOTE — Telephone Encounter (Signed)
 I called Cynthia [name] and looked at the pictures from the her rash  Looks like a irritant dermatitis.  See MyChart message for treatment.  Will use H1 and H2 blockers and take prednisone  if not improving.

## 2024-02-20 ENCOUNTER — Encounter: Payer: Self-pay | Admitting: Allergy & Immunology

## 2024-02-20 ENCOUNTER — Ambulatory Visit: Admitting: Allergy & Immunology

## 2024-02-20 ENCOUNTER — Other Ambulatory Visit: Payer: Self-pay

## 2024-02-20 VITALS — BP 110/80 | HR 113 | Temp 98.7°F | Resp 17 | Ht 61.0 in | Wt 251.4 lb

## 2024-02-20 DIAGNOSIS — J4520 Mild intermittent asthma, uncomplicated: Secondary | ICD-10-CM

## 2024-02-20 DIAGNOSIS — J302 Other seasonal allergic rhinitis: Secondary | ICD-10-CM

## 2024-02-20 DIAGNOSIS — D8940 Mast cell activation, unspecified: Secondary | ICD-10-CM | POA: Diagnosis not present

## 2024-02-20 DIAGNOSIS — J3089 Other allergic rhinitis: Secondary | ICD-10-CM | POA: Diagnosis not present

## 2024-02-20 NOTE — Progress Notes (Signed)
 FOLLOW UP  Date of Service/Encounter:  02/20/24   Assessment:   Mast cell activation  Mild intermittent asthma without complication   Seasonal and perennial allergic rhinitis (grasses, weeds, trees, ragweed, indoor/outdoor molds)  Rash and contact dermatitis   Plan/Recommendations:   Asthma - Lung testing looks great today. - Continue montelukast  10 mg once a day to prevent cough or wheeze - Continue albuterol  2 puffs once every 4 hours as needed for cough or wheeze - Continue albuterol  2 puffs 5 to 15 minutes before activity to decrease cough or wheeze  Seasonal and perennial allergic rhinitis (grasses, weeds, trees, ragweed, indoor/outdoor molds)  -Continue Ragwitek 1 sublingual tablet once a day. - Continue Xyzal  5 mg once daily.  - Continue with Singulair  10mg  daily.  - Continue Qnasl  2 sprays in each nostril once a day as needed for a stuffy nose  Allergic conjunctivitis - Continue with Pataday  one drop in each eye once a day as needed for red, itchy eyes OR Zaditor one drop in each eye twice a day as needed for red itchy eyes. - Avoid eye drops that say red eye relief as they may contain medications that dry out your eyes.   POTS - Continue with the current workup and management.   Rash with contact dermatitis - We will plan for patch testing to NAC-80  Concern for MCAS - We will get some labs to rule out mast cell activation syndrome.  - These tests are not 100% and oftentimes are normal.  - We are going to get a standing order for a serum tryptase to get when you have a flare. - I would recommend doing Xyzal  5mg  and Singulair  10mg  once daily. - We may add some more meidcations depending on your lab results.   Return in about 4 weeks (around 03/19/2024). You can have the follow up appointment with Dr. Iva or a Nurse Practicioner (our Nurse Practitioners are excellent and always have Physician oversight!).   Subjective:   Cynthia [name] is a 38 y.o.  female presenting today for follow up of  Chief Complaint  Patient presents with   Asthma    Doing okay   Rash    Was told it was Mass cell activation syndrome.    Allergic Reaction    Was supposed to wear a heart monitor. Put it on Thursday and get some irritation. Then had to take it off Friday due to irritation. Alternating between allerga and xyzal .   Allergic Rhinitis     Doing well. Some nasal dryness.    Cynthia [name] has a history of the following: Patient Active Problem List   Diagnosis Date Noted   Mild intermittent asthma without complication 04/18/2023   Nephrolithiasis 02/13/2023   Acute non-recurrent maxillary sinusitis 09/13/2022   Increased heart rate 10/11/2021   Binge eating disorder 10/11/2021   Impaired fasting glucose 10/11/2021   GAD (generalized anxiety disorder) 10/11/2021   Attention deficit disorder (ADD) without hyperactivity 09/17/2019   Primary osteoarthritis of both knees 08/25/2019   Anxiety 06/04/2019   DDD (degenerative disc disease), cervical 06/03/2018   Degeneration of lumbar intervertebral disc 06/03/2018   Seasonal and perennial allergic rhinitis 05/05/2018   Seasonal allergic conjunctivitis 05/05/2018   Moderate persistent asthma, uncomplicated 05/05/2018   S/P arthroscopy of knee 04/11/2018   Prediabetes 06/11/2017   Vitamin D  deficiency 06/11/2017   Class 3 severe obesity with serious comorbidity and body mass index (BMI) of 45.0 to 49.9 in adult 01/09/2012  Asthma     History obtained from: chart review and patient.  Discussed the use of AI scribe software for clinical note transcription with the patient and/or guardian, who gave verbal consent to proceed.  Cynthia [name] is a 38 y.o. female presenting for a follow up visit.  The patient had a zio patch monitor placed on the chest last week. Within an hour or two she felt skin irritation. At that time, her skin became red. Cortisone cream around the monitor seemed to help, but her  symptoms worsened under the monitor. Over the next day or two she had redness spread outward and up to her neck. Then, she removed the ziopatch. Her skin continued to develop mild swelling along with the redness and itchiness. Since she also had an aquacell bandage on the knee after a surgery that caused a reaction, her PCP recommended to get tested for causes. She notes that vinyl adhesives, tapes, and latex have given her irritative skin reactions for many years.  About once a month, the patient develops a facial rash, around the nose and bilateral cheeks, that has warranted a workup for lupus, which was negative. She says that this rash is intermittent and she has not been able to relate a clear cause of contact or diet. The patient says that this redness spreads to the neck and upper chest at times.  Cynthia [name] is being worked up to confirm EDS after lifelong subluxations and orthopedic injuries. She was clinically diagnosed a few months ago, but has not received genetic testing to identify subtyping.  She notes that she regularly has an elevated heart rate as well as palpitations and positional light headness. Cynthia [name] had a cardiac workup with normal echo, but was not able to tolerate the zio patch as noted above.  Otherwise, there have been no changes to her past medical history, surgical history, family history, or social history.  Review of systems otherwise negative other than that mentioned in the HPI.   Objective:   Blood pressure 110/80, pulse (!) 113, temperature 98.7 F (37.1 C), resp. rate 17, height 5' 1 (1.549 m), weight 251 lb 6.4 oz (114 kg), SpO2 98%. Body mass index is 47.5 kg/m.   Physical Exam Constitutional:      Appearance: Normal appearance.  HENT:     Head: Normocephalic.     Right Ear: Tympanic membrane normal.     Left Ear: Tympanic membrane normal.     Nose: Nose normal.     Mouth/Throat:     Mouth: Mucous membranes are moist.  Cardiovascular:     Rate and  Rhythm: Normal rate and regular rhythm.  Pulmonary:     Effort: Pulmonary effort is normal.     Breath sounds: Normal breath sounds.  Abdominal:     Palpations: Abdomen is soft.  Musculoskeletal:        General: Normal range of motion.     Cervical back: Normal range of motion.  Skin:    General: Skin is warm.     Comments: 4 cm circular patch of erythema just left of midline to the chest with irregular areas of redness superiorly  Neurological:     General: No focal deficit present.     Mental Status: She is alert and oriented to person, place, and time.     Gait: Gait normal.  Psychiatric:        Mood and Affect: Mood normal.        Behavior: Behavior normal.  Diagnostic studies: none  Spirometry: results normal (FEV1: 2.63 L/91.6%, FVC: 3.24 L/93.6%, FEV1/FVC: 81%).    Spirometry consistent with normal pattern.  Allergy  Studies: Deferred, will arrange patch testing.  Cynthia [name], MS4 [facility]     I performed a history and physical examination of the patient and discussed her management with the Medical Student. I reviewed the Nurse Practitioner's note and agree with the documented findings and plan of care. The note in its entirety was edited by myself, including the physical exam, assessment, and plan.   Briefly, Cynthia [name] is a 38 year old female with Ehlers-Danlos syndrome who presents with a rash and concerns about mast cell activation syndrome.  She has a history of Ehlers-Danlos syndrome, diagnosed in April or May after over a decade of exploration. She developed a bumpy rash after wearing a cardiac monitor, which spread up her neck and was associated with significant itching. Initial treatment with hydrocortisone cream and EmuAid provided relief. She doubled her doses of Allegra  or Zyrtec, resulting in significant fatigue. She has not yet started the prescribed steroids.  She experiences tachycardia with heart rates reaching 110 to 130 bpm  with minimal activity, despite a resting heart rate of 64 bpm. POTS has been considered for some time. She is managing her weight with medications like Saxenda , Zepbound , and Mounjaro , but insurance issues have caused interruptions.  She reports being told she has severe arthrosis in five levels of her thoracic spine, and her neck and low back have not been scanned but are areas that cause her significant discomfort. Her orthopedic issues stem from a history of gymnastics and competitive cheerleading from ages [age] to 75. She is exploring disability due to the impact of these issues on her life.  She has a history of IBS, particularly IBSD, which developed after gallbladder removal. She previously used hyoscyamine but has switched to glycopyrrolate , which has helped her symptoms. She has not seen a GI specialist or had a colonoscopy or endoscopy.  She has hyperhidrosis and has been prescribed glycopyrrolate , which has allowed her to stop using hyoscyamine. She experienced a severe allergic reaction to Aquacel during knee surgery six years ago, resulting in a burn-like reaction.  Her family history includes a mother with MS and possible Ehlers-Danlos syndrome.   Today she is specifically concerned with a diagnosis of MCAS. A diagnosis of mast cell activation syndrome has been proposed to diagnose patients with a variety of symptoms and laboratory studies possibly suggestive of mast cell activation without meeting full criteria for diagnosing systemic mastocytosis. This diagnosis remains somewhat controversial with the proposed diagnostic criteria continuing to evolve, but at present it may be reasonable to consider this diagnosis if the serum tryptase is found to be significantly elevated when the patient experiences a flare of symptoms, and then falls back to the pt's baseline when symptoms recede to baseline. Included in the diagnostic criteria at present is also the existence of certain clinical  symptoms which are significantly relieved with anti-mast cell mediator therapy (i.e. antihistamines - H1 H2 blockers, montelukast , etc.) Thus, if mast cell involvement remains in the differential diagnosis, recommend serial serum tryptase blood testing and correlation of tryptase values with patient symptoms.   We are going to test other mediators of mast cell activation, including plasma and urinary metabolites (prostaglandins PGD2, PGF2alpha, etc.). These have been also proposed to be elevated with MCAS, however there are issues with these tests that potentially confound the interpretation of the results and  make these tests less reliable. Histamine levels are confounded by dietary ingestion of histamine-containing foods and histamine is not necessarily a reliable assay of histamine release if a histamine-free diet has not been carefully followed beforehand.  In addition, the regular use of oral antihistamines might result in alterations of the levels.  Cessation of antihistamines before collecting these has been proposed, but oftentimes patients cannot tolerate being off of antihistamines due to their symptoms.  In addition, there are other sources of prostaglandin metabolites.The prostaglandins PGD2 and PGF2alpha - metabolites from PGH2-synthase/COX-1/2 activation and PGD2 synthase - can be synthesized by hematopoietic cells (such as histiocytes, Langerhans cells, dendritic cells, Th2 lymphocytes, in addition to mast cells) and also non-hematopoietic cells (such as neurons and oligodendrocytes in the central nervous system (CNS), epithelial cells of the choroid plexus, Leydig cells in female genital organs, and endocardial and myocardial cells, amongst others). Therefore the detection of PGD2 or its metabolites is somewhat problematic in the evaluation of mast cell activation as numerous cell types can generate PGD2, in addition to mast cells. In fact, the detection of elevated PGD2 in the setting of a normal  tryptase can implicate a non-mast cell source of PGD2, and thus does not necessarily add additional clarity when compared to the more mast cell-specific protein tryptase.   We will order a standing order in order to check the tryptase serially within 4-6 hours of the onset of the patient's own flaring of symptoms.  This will help to correlate any raises of tryptase with symptoms, triggers, and timing.  In the meantime, we are going to attempt to manage symptoms with a combination of histamine blockers and other medications to help alleviatee the constellation of symptoms.      Total of 60 minutes, greater than 50% of which was spent in discussion of treatment and management options.      Cynthia Shaggy, MD  [facility] 

## 2024-02-20 NOTE — Patient Instructions (Addendum)
 Asthma - Lung testing looks great today. - Continue montelukast  10 mg once a day to prevent cough or wheeze - Continue albuterol  2 puffs once every 4 hours as needed for cough or wheeze - Continue albuterol  2 puffs 5 to 15 minutes before activity to decrease cough or wheeze  Seasonal and perennial allergic rhinitis (grasses, weeds, trees, ragweed, indoor/outdoor molds)  -Continue Ragwitek 1 sublingual tablet once a day. - Continue Xyzal  5 mg once daily.  - Continue with Singulair  10mg  daily.  - Continue Qnasl  2 sprays in each nostril once a day as needed for a stuffy nose  Allergic conjunctivitis - Continue with Pataday  one drop in each eye once a day as needed for red, itchy eyes OR Zaditor one drop in each eye twice a day as needed for red itchy eyes. - Avoid eye drops that say red eye relief as they may contain medications that dry out your eyes.   POTS - Continue with the current workup and management.   Rash with contact dermatitis - We will plan for patch testing to NAC-80  Concern for MCAS - We will get some labs to rule out mast cell activation syndrome.  - These tests are not 100% and oftentimes are normal.  - We are going to get a standing order for a serum tryptase to get when you have a flare. - I would recommend doing Xyzal  5mg  and Singulair  10mg  once daily. - We may add some more meidcations depending on your lab results.   Return in about 4 weeks (around 03/19/2024). You can have the follow up appointment with Dr. Iva or a Nurse Practicioner (our Nurse Practitioners are excellent and always have Physician oversight!).    Please inform us  of any Emergency Department visits, hospitalizations, or changes in symptoms. Call us  before going to the ED for breathing or allergy  symptoms since we might be able to fit you in for a sick visit. Feel free to contact us  anytime with any questions, problems, or concerns.  It was a pleasure to see you again today!  Websites  that have reliable patient information: 1. American Academy of Asthma, Allergy , and Immunology: www.aaaai.org 2. Food Allergy  Research and Education (FARE): foodallergy.org 3. Mothers of Asthmatics: http://www.asthmacommunitynetwork.org 4. [employer] of Allergy , Asthma, and Immunology: www.acaai.org      "Like" us  on Facebook and Instagram for our latest updates!      A healthy democracy works best when [employer] participate! Make sure you are registered to vote! If you have moved or changed any of your contact information, you will need to get this updated before voting! Scan the QR codes below to learn more!

## 2024-02-24 LAB — KIT (D816V) DIGITAL PCR

## 2024-02-25 ENCOUNTER — Other Ambulatory Visit (HOSPITAL_BASED_OUTPATIENT_CLINIC_OR_DEPARTMENT_OTHER): Payer: Self-pay

## 2024-02-25 ENCOUNTER — Other Ambulatory Visit: Payer: Self-pay | Admitting: Allergy & Immunology

## 2024-02-25 MED ORDER — ZEPBOUND 12.5 MG/0.5ML ~~LOC~~ SOAJ
12.5000 mg | SUBCUTANEOUS | 1 refills | Status: DC
  Filled 2024-02-25: qty 6, 84d supply, fill #0
  Filled 2024-05-26: qty 6, 84d supply, fill #1

## 2024-02-26 LAB — KIT (D816V) DIGITAL PCR: CKIT Result: NEGATIVE

## 2024-02-26 LAB — ALPHA-GAL PANEL
Allergen Lamb IgE: <0.1 kU/L
Beef IgE: <0.1 kU/L
IgE (Immunoglobulin E), Serum: 62 [IU]/mL (ref 6–495)
O215-IgE Alpha-Gal: <0.1 kU/L
Pork IgE: <0.1 kU/L

## 2024-02-26 LAB — PROSTAGLANDIN D2, SERUM: Prostaglandin D2, serum: 37 pg/mL

## 2024-03-02 LAB — EDI MISCELLANEOUS

## 2024-03-03 NOTE — Progress Notes (Deleted)
 Ben Jackson D[facility] [address] [place] [address] Phone: [phone]   Assessment and Plan:     There are no diagnoses linked to this encounter.  *** - Patient has received relief with OMT in the past.  Elects for repeat OMT today.  Tolerated well per note below. - Decision today to treat with OMT was based on Physical Exam   After verbal consent patient was treated with HVLA (high velocity low amplitude), ME (muscle energy), FPR (flex positional release), ST (soft tissue), PC/PD (Pelvic Compression/ Pelvic Decompression) techniques in cervical, rib, thoracic, lumbar, and pelvic areas. Patient tolerated the procedure well with improvement in symptoms.  Patient educated on potential side effects of soreness and recommended to rest, hydrate, and use Tylenol  as needed for pain control.   Pertinent previous records reviewed include ***    Follow Up: ***     Subjective:   I, Treyten Monestime, am serving as a Neurosurgeon for Doctor Morene Mace  Chief Complaint: OMT   HPI:    10/30/2023 Patient is a 38 year old female with EDS. Patient states recent diagnosis EDS. Fall right before christmas off of 6 ft ladder. Hx of back, knee, shoulder injuries. Whole back pain as of late. She feels crooked at the hips right hip especially. Back ,neck and shoulders are tight. She is in PT and they do dry needle and that has really helped.    11/27/2023 Patient states she is ready for tune up    12/24/2023 Patient states didn't get as much relied as the first visit. She is in flare now    01/21/2024 Patient states she is okay. Back is flared she is only comfortable laying on her side     02/12/2024 Patient states she is okay about the same   03/04/2024 Patient states   Relevant Historical Information: Hypermobility, morbid obesity,  Additional pertinent review of systems negative.  Current Outpatient Medications  Medication Sig Dispense Refill   albuterol   (VENTOLIN  HFA) 108 (90 Base) MCG/ACT inhaler Inhale 2 puffs into the lungs every 4 (four) hours as needed for wheezing or shortness of breath. 1 each 3   BLISOVI FE 1/20 1-20 MG-MCG tablet Take 1 tablet by mouth daily.     COVID-19 mRNA bivalent vaccine, Pfizer, (PFIZER COVID-19 VAC BIVALENT) injection Inject into the muscle. 0.3 mL 0   cyanocobalamin  (VITAMIN B12) 1000 MCG tablet Take by mouth.     cyclobenzaprine (FLEXERIL) 10 MG tablet TAKE 1 TABLET BY MOUTH THREE TIMES DAILY FOR 15 DAYS AS NEEDED FOR MUSCLE SPASMS     EPINEPHrine  0.3 mg/0.3 mL IJ SOAJ injection Inject 0.3 mg into the muscle as needed for anaphylaxis. 1 each 2   famotidine  (PEPCID ) 20 MG tablet TAKE 1 TABLET(20 MG) BY MOUTH TWICE DAILY 180 tablet 0   fexofenadine  (ALLEGRA ) 180 MG tablet Take 1 tablet (180 mg total) by mouth daily. 90 tablet 1   FLUoxetine  (PROZAC ) 40 MG capsule Take 1 capsule (40 mg total) by mouth daily. 90 capsule 0   gabapentin  (NEURONTIN ) 300 MG capsule TAKE 1 CAPSULE BY MOUTH EVERY MORNING, 1 CAPSULE IN THE EVENING, AND 2 CAPSULES AT BEDTIME//Please call and make overdue appt for further refills 120 capsule 0   glycopyrrolate  (ROBINUL ) 1 MG tablet Take 1 tablet (1 mg total) by mouth 3 (three) times daily. 120 tablet 1   glycopyrrolate  (ROBINUL ) 1 MG tablet Take 1 tablet (1 mg total) by mouth 3 (three) times daily.  270 tablet 3   HYDROcodone -acetaminophen  (NORCO/VICODIN) 5-325 MG tablet Take 1 tablet by mouth every 6 (six) hours as needed.     hyoscyamine (LEVSIN SL) 0.125 MG SL tablet PLACE 1 TABLET UNDER THE TONGUE PRIOR TO EACH MEAL AND AT BEDTIME AS NEEDED     levocetirizine (XYZAL ) 5 MG tablet TAKE 1 TABLET(5 MG) BY MOUTH EVERY EVENING 90 tablet 1   Lisdexamfetamine Dimesylate  (VYVANSE ) 40 MG CHEW Chew 40 mg by mouth daily at 12 noon. 30 tablet 0   methocarbamol  (ROBAXIN ) 500 MG tablet Take 1 tablet (500 mg total) by mouth 2 (two) times daily as needed for muscle spasms. 60 tablet 0   montelukast   (SINGULAIR ) 10 MG tablet TAKE 1 TABLET(10 MG) BY MOUTH AT BEDTIME. NEED APPOINTMENT FOR ADDITIONAL REFILLS. 90 tablet 1   norethindrone -ethinyl estradiol (LOESTRIN) 1-20 MG-MCG tablet Take 1 tablet by mouth daily.     omeprazole (PRILOSEC) 20 MG capsule 1 capsule 30 minutes before morning meal     ondansetron  (ZOFRAN  ODT) 4 MG disintegrating tablet Take 1 tablet (4 mg total) by mouth every 8 (eight) hours as needed for nausea or vomiting. 20 tablet 0   predniSONE  (STERAPRED UNI-PAK 48 TAB) 5 MG (48) TBPK tablet 12 day dosepack po 48 tablet 0   Semaglutide -Weight Management 1 MG/0.5ML SOAJ Inject 1 mg into the skin once a week. 6 mL 0   Short Ragweed Pollen Ext (RAGWITEK) 12 AMB A 1-U SUBL Place 1 tablet under the tongue daily. Bring this prescription to your appointment and you will take the first SL tablet in the clinic. 30 tablet 5   SYRINGE-NEEDLE, DISP, 3 ML (EASYPOINT NEEDLE/SYRINGE) 25G X 5/8 3 ML MISC Use one needle/syringe to inject Vitamin B12 into the muscle once every 7 days. *Disp. whatever insurance will cover*     tirzepatide  (ZEPBOUND ) 10 MG/0.5ML Pen Inject 10 mg into the skin once a week. 2 mL 0   tirzepatide  (ZEPBOUND ) 10 MG/0.5ML Pen Inject 10 mg into the skin every 7 (seven) days. 6 mL 0   tirzepatide  (ZEPBOUND ) 12.5 MG/0.5ML Pen Inject 12.5 mg into the skin every 7 (seven) days. 2 mL 0   tirzepatide  (ZEPBOUND ) 12.5 MG/0.5ML Pen Inject 12.5 mg into the skin once a week. 6 mL 1   tirzepatide  (ZEPBOUND ) 15 MG/0.5ML Pen Inject 15 mg into the skin once a week. 2 mL 0   tirzepatide  (ZEPBOUND ) 5 MG/0.5ML injection vial Inject 5 mg into the skin once a week. 6 mL 0   tirzepatide  (ZEPBOUND ) 5 MG/0.5ML Pen Inject 5 mg into the skin once a week. 6 mL 0   tirzepatide  (ZEPBOUND ) 7.5 MG/0.5ML Pen Inject 7.5 mg into the skin once a week. 6 mL 0   traMADol (ULTRAM) 50 MG tablet TAKE 1 TABLET BY MOUTH THREE TIMES DAILY FOR 5 DAYS AS NEEDED     traZODone (DESYREL) 100 MG tablet Take 100 mg by  mouth at bedtime.     Vitamin D , Ergocalciferol , (DRISDOL ) 1.25 MG (50000 UNIT) CAPS capsule Take 1 capsule (50,000 Units total) by mouth every 7 (seven) days. 4 capsule 0   XIIDRA 5 % SOLN      No current facility-administered medications for this visit.      Objective:     There were no vitals filed for this visit.    There is no height or weight on file to calculate BMI.    Physical Exam:     General: Well-appearing, cooperative, sitting comfortably in no acute  distress.   OMT Physical Exam:  ASIS Compression Test: Positive Right Cervical: TTP paraspinal, *** Rib: Bilateral elevated first rib with TTP Thoracic: TTP paraspinal,*** Lumbar: TTP paraspinal,*** Pelvis: Right anterior innominate  Electronically signed by:  Odis Mace D[facility] 7:42 AM 03/03/24

## 2024-03-04 ENCOUNTER — Ambulatory Visit: Admitting: Sports Medicine

## 2024-03-04 LAB — OTHER LAB TEST

## 2024-03-05 ENCOUNTER — Ambulatory Visit: Payer: Self-pay | Admitting: Allergy & Immunology

## 2024-03-05 DIAGNOSIS — D8940 Mast cell activation, unspecified: Secondary | ICD-10-CM

## 2024-03-05 LAB — PROSTAGLANDIN D2/CREATININE, U
Creatinine, Urine: 177 mg/dL
Prostaglandin D2, urine: 36 pg/mL
Prostaglandin D2/Cr Ratio: 20 ng/g

## 2024-03-05 LAB — N-METHYLHISTAMINE, 24 HR, U
Collection Duration (h): 24 h
Creatinine Concent. 24 Hr, U: 96 mg/dL
Creatinine, 24 Hour, U: 1152 mg/(24.h) (ref 603–1783)
N-Methylhistamine, 24 Hr, U: 67 ug/g{creat} (ref 30–200)
Urine Volume (mL): 1200 mL

## 2024-03-05 LAB — LEUKOTRIENE E4, 24 HR, U
Collection Duration: 24 h
Creatinine Concentration,24 HR: 97 mg/dL
Creatinine, 24 HR, U: 1164 mg/(24.h) (ref 603–1783)
Leukotriene E4, U: 81 pg/mg{creat} (ref <=104)
Urine Volume: 1200 mL

## 2024-03-05 NOTE — Telephone Encounter (Signed)
 Reordered urine test.

## 2024-03-06 ENCOUNTER — Ambulatory Visit: Payer: Self-pay | Admitting: Allergy & Immunology

## 2024-03-10 NOTE — Progress Notes (Unsigned)
 Ben Jackson D[facility] [address] [place] [address] Phone: [phone]   Assessment and Plan:     There are no diagnoses linked to this encounter.  *** - Patient has received relief with OMT in the past.  Elects for repeat OMT today.  Tolerated well per note below. - Decision today to treat with OMT was based on Physical Exam   After verbal consent patient was treated with HVLA (high velocity low amplitude), ME (muscle energy), FPR (flex positional release), ST (soft tissue), PC/PD (Pelvic Compression/ Pelvic Decompression) techniques in cervical, rib, thoracic, lumbar, and pelvic areas. Patient tolerated the procedure well with improvement in symptoms.  Patient educated on potential side effects of soreness and recommended to rest, hydrate, and use Tylenol  as needed for pain control.   Pertinent previous records reviewed include ***    Follow Up: ***     Subjective:    Chief Complaint: OMT  HPI:  10/30/2023 Patient is a 38 year old female with EDS. Patient states recent diagnosis EDS. Fall right before christmas off of 6 ft ladder. Hx of back, knee, shoulder injuries. Whole back pain as of late. She feels crooked at the hips right hip especially. Back ,neck and shoulders are tight. She is in PT and they do dry needle and that has really helped.    11/27/2023 Patient states she is ready for tune up    12/24/2023 Patient states didn't get as much relied as the first visit. She is in flare now    01/21/2024 Patient states she is okay. Back is flared she is only comfortable laying on her side     02/12/2024 Patient states she is okay about the same   03/11/24 Patient states   Relevant Historical Information: Hypermobility, morbid obesity,  Additional pertinent review of systems negative.  Current Outpatient Medications  Medication Sig Dispense Refill   albuterol  (VENTOLIN  HFA) 108 (90 Base) MCG/ACT inhaler Inhale 2 puffs into the lungs every 4  (four) hours as needed for wheezing or shortness of breath. 1 each 3   BLISOVI FE 1/20 1-20 MG-MCG tablet Take 1 tablet by mouth daily.     COVID-19 mRNA bivalent vaccine, Pfizer, (PFIZER COVID-19 VAC BIVALENT) injection Inject into the muscle. 0.3 mL 0   cyanocobalamin  (VITAMIN B12) 1000 MCG tablet Take by mouth.     cyclobenzaprine (FLEXERIL) 10 MG tablet TAKE 1 TABLET BY MOUTH THREE TIMES DAILY FOR 15 DAYS AS NEEDED FOR MUSCLE SPASMS     EPINEPHrine  0.3 mg/0.3 mL IJ SOAJ injection Inject 0.3 mg into the muscle as needed for anaphylaxis. 1 each 2   famotidine  (PEPCID ) 20 MG tablet TAKE 1 TABLET(20 MG) BY MOUTH TWICE DAILY 180 tablet 0   fexofenadine  (ALLEGRA ) 180 MG tablet Take 1 tablet (180 mg total) by mouth daily. 90 tablet 1   FLUoxetine  (PROZAC ) 40 MG capsule Take 1 capsule (40 mg total) by mouth daily. 90 capsule 0   gabapentin  (NEURONTIN ) 300 MG capsule TAKE 1 CAPSULE BY MOUTH EVERY MORNING, 1 CAPSULE IN THE EVENING, AND 2 CAPSULES AT BEDTIME//Please call and make overdue appt for further refills 120 capsule 0   glycopyrrolate  (ROBINUL ) 1 MG tablet Take 1 tablet (1 mg total) by mouth 3 (three) times daily. 120 tablet 1   glycopyrrolate  (ROBINUL ) 1 MG tablet Take 1 tablet (1 mg total) by mouth 3 (three) times daily. 270 tablet 3   HYDROcodone -acetaminophen  (NORCO/VICODIN) 5-325 MG tablet Take 1 tablet by mouth  every 6 (six) hours as needed.     hyoscyamine (LEVSIN SL) 0.125 MG SL tablet PLACE 1 TABLET UNDER THE TONGUE PRIOR TO EACH MEAL AND AT BEDTIME AS NEEDED     levocetirizine (XYZAL ) 5 MG tablet TAKE 1 TABLET(5 MG) BY MOUTH EVERY EVENING 90 tablet 1   Lisdexamfetamine Dimesylate  (VYVANSE ) 40 MG CHEW Chew 40 mg by mouth daily at 12 noon. 30 tablet 0   methocarbamol  (ROBAXIN ) 500 MG tablet Take 1 tablet (500 mg total) by mouth 2 (two) times daily as needed for muscle spasms. 60 tablet 0   montelukast  (SINGULAIR ) 10 MG tablet TAKE 1 TABLET(10 MG) BY MOUTH AT BEDTIME. NEED APPOINTMENT FOR  ADDITIONAL REFILLS. 90 tablet 1   norethindrone -ethinyl estradiol (LOESTRIN) 1-20 MG-MCG tablet Take 1 tablet by mouth daily.     omeprazole (PRILOSEC) 20 MG capsule 1 capsule 30 minutes before morning meal     ondansetron  (ZOFRAN  ODT) 4 MG disintegrating tablet Take 1 tablet (4 mg total) by mouth every 8 (eight) hours as needed for nausea or vomiting. 20 tablet 0   predniSONE  (STERAPRED UNI-PAK 48 TAB) 5 MG (48) TBPK tablet 12 day dosepack po 48 tablet 0   Semaglutide -Weight Management 1 MG/0.5ML SOAJ Inject 1 mg into the skin once a week. 6 mL 0   Short Ragweed Pollen Ext (RAGWITEK) 12 AMB A 1-U SUBL Place 1 tablet under the tongue daily. Bring this prescription to your appointment and you will take the first SL tablet in the clinic. 30 tablet 5   SYRINGE-NEEDLE, DISP, 3 ML (EASYPOINT NEEDLE/SYRINGE) 25G X 5/8 3 ML MISC Use one needle/syringe to inject Vitamin B12 into the muscle once every 7 days. *Disp. whatever insurance will cover*     tirzepatide  (ZEPBOUND ) 10 MG/0.5ML Pen Inject 10 mg into the skin once a week. 2 mL 0   tirzepatide  (ZEPBOUND ) 10 MG/0.5ML Pen Inject 10 mg into the skin every 7 (seven) days. 6 mL 0   tirzepatide  (ZEPBOUND ) 12.5 MG/0.5ML Pen Inject 12.5 mg into the skin every 7 (seven) days. 2 mL 0   tirzepatide  (ZEPBOUND ) 12.5 MG/0.5ML Pen Inject 12.5 mg into the skin once a week. 6 mL 1   tirzepatide  (ZEPBOUND ) 15 MG/0.5ML Pen Inject 15 mg into the skin once a week. 2 mL 0   tirzepatide  (ZEPBOUND ) 5 MG/0.5ML injection vial Inject 5 mg into the skin once a week. 6 mL 0   tirzepatide  (ZEPBOUND ) 5 MG/0.5ML Pen Inject 5 mg into the skin once a week. 6 mL 0   tirzepatide  (ZEPBOUND ) 7.5 MG/0.5ML Pen Inject 7.5 mg into the skin once a week. 6 mL 0   traMADol (ULTRAM) 50 MG tablet TAKE 1 TABLET BY MOUTH THREE TIMES DAILY FOR 5 DAYS AS NEEDED     traZODone (DESYREL) 100 MG tablet Take 100 mg by mouth at bedtime.     Vitamin D , Ergocalciferol , (DRISDOL ) 1.25 MG (50000 UNIT) CAPS  capsule Take 1 capsule (50,000 Units total) by mouth every 7 (seven) days. 4 capsule 0   XIIDRA 5 % SOLN      No current facility-administered medications for this visit.      Objective:     There were no vitals filed for this visit.    There is no height or weight on file to calculate BMI.    Physical Exam:     General: Well-appearing, cooperative, sitting comfortably in no acute distress.   OMT Physical Exam:  ASIS Compression Test: Positive Right Cervical: TTP paraspinal, ***  Rib: Bilateral elevated first rib with TTP Thoracic: TTP paraspinal,*** Lumbar: TTP paraspinal,*** Pelvis: Right anterior innominate  Electronically signed by:  Odis Mace [facility] 3:29 PM 03/10/24

## 2024-03-11 ENCOUNTER — Ambulatory Visit (INDEPENDENT_AMBULATORY_CARE_PROVIDER_SITE_OTHER): Admitting: Sports Medicine

## 2024-03-11 VITALS — BP 110/70 | HR 98 | Ht 61.0 in

## 2024-03-11 DIAGNOSIS — M546 Pain in thoracic spine: Secondary | ICD-10-CM | POA: Diagnosis not present

## 2024-03-11 DIAGNOSIS — M9902 Segmental and somatic dysfunction of thoracic region: Secondary | ICD-10-CM

## 2024-03-11 DIAGNOSIS — M9901 Segmental and somatic dysfunction of cervical region: Secondary | ICD-10-CM | POA: Diagnosis not present

## 2024-03-11 DIAGNOSIS — M9908 Segmental and somatic dysfunction of rib cage: Secondary | ICD-10-CM

## 2024-03-11 DIAGNOSIS — M542 Cervicalgia: Secondary | ICD-10-CM | POA: Diagnosis not present

## 2024-03-11 DIAGNOSIS — M9903 Segmental and somatic dysfunction of lumbar region: Secondary | ICD-10-CM

## 2024-03-11 DIAGNOSIS — M545 Low back pain, unspecified: Secondary | ICD-10-CM | POA: Diagnosis not present

## 2024-03-11 DIAGNOSIS — G8929 Other chronic pain: Secondary | ICD-10-CM

## 2024-03-11 DIAGNOSIS — M9905 Segmental and somatic dysfunction of pelvic region: Secondary | ICD-10-CM | POA: Diagnosis not present

## 2024-03-12 NOTE — Progress Notes (Signed)
  I,  Claretha Schimke am a scribe for Dr. Artist Lloyd, MD.  Cynthia [name] is a 38 y.o. female who presents to [facility] today for f/u EDS and polyarthralgia. Pt was last seen by Dr. Lloyd on 10/23/23 and labs were obtained, heart monitor and echocardiogram were ordered.   In the interim, she has been treated w/ OMT by Dr. Leonce, last visit, 03/11/24.  Today, pt reports here for follow up of EDS, Potts, and Mast cell. Doing better in some ways than last week.   Dx testing: 02/25/24 Labs  10/23/23 Labs  Pertinent review of systems: No fevers or chills  Relevant historical information: Ehlers-Danlos syndrome and POTS. Receiving chronic back pain treatment with Dr. Bonner at [facility] ortho   Exam:  BP 116/70   Pulse (!) 103   Ht 5' 1 (1.549 m)   SpO2 96%   BMI 47.50 kg/m  General: Well Developed, well nourished, and in no acute distress.   MSK: Normal cervical and lumbar motion.    Assessment and Plan: 38 y.o. female with Ehlers-Danlos syndrome with chronic back and hip and neck pain.  Plan to refer to physical therapy especially for aquatic physical therapy.  She is done pretty well with this in the past.  Dysautonomia/POTS typically associated with EDS.  Patient had a normal echocardiogram but could not tolerate the long-term monitor as she had an allergic reaction to the adhesive.  EKG was normal.  Plan to treat with POTS with conservative management including up to 8 g of sodium chloride  a day and 3 L of water a day with compression garments.  Also recommend the [place]/[facility] protocol for exercise tolerance improvement.  Work status: Patient has not been able to work since May 24, 2023.  Starting in September 21, 2023 she formally ended her unemployment insurance and has not attempted to return to work since due to her constellation of medical problems including chronic back and neck pain, and Ehlers-Danlos syndrome.  She is not able to return to work at  this time.  Recheck in 2 months.   PDMP not reviewed this encounter. Orders Placed This Encounter  Procedures   Ambulatory referral to Physical Therapy    Referral Priority:   Routine    Referral Type:   Physical Medicine    Referral Reason:   Specialty Services Required    Requested Specialty:   Physical Therapy    Number of Visits Requested:   1   No orders of the defined types were placed in this encounter.  Total encounter time 30 minutes including face-to-face time with the patient and, reviewing past medical record, and charting on the date of service.     Discussed warning signs or symptoms. Please see discharge instructions. Patient expresses understanding.   The above documentation has been reviewed and is accurate and complete Artist Lloyd, M.D.

## 2024-03-13 LAB — SPECIMEN STATUS REPORT

## 2024-03-16 ENCOUNTER — Ambulatory Visit (INDEPENDENT_AMBULATORY_CARE_PROVIDER_SITE_OTHER): Admitting: Family Medicine

## 2024-03-16 VITALS — BP 116/70 | HR 103 | Ht 61.0 in

## 2024-03-16 DIAGNOSIS — M546 Pain in thoracic spine: Secondary | ICD-10-CM | POA: Diagnosis not present

## 2024-03-16 DIAGNOSIS — Q796 Ehlers-Danlos syndrome, unspecified: Secondary | ICD-10-CM | POA: Diagnosis not present

## 2024-03-16 DIAGNOSIS — M545 Low back pain, unspecified: Secondary | ICD-10-CM | POA: Diagnosis not present

## 2024-03-16 DIAGNOSIS — M542 Cervicalgia: Secondary | ICD-10-CM | POA: Diagnosis not present

## 2024-03-16 DIAGNOSIS — G8929 Other chronic pain: Secondary | ICD-10-CM | POA: Insufficient documentation

## 2024-03-16 DIAGNOSIS — G901 Familial dysautonomia [Riley-Day]: Secondary | ICD-10-CM | POA: Insufficient documentation

## 2024-03-16 NOTE — Patient Instructions (Addendum)
 Thank you for coming in today.   [email or web address]  PT.   POTS 8-12 g of salt a day plus 3L of water. I would start at about half.   Mag is ok. Too much will diarrhea.   Disjointed Book.    There is a EDS facebookgroup.   Recheck in 2 months.

## 2024-03-20 ENCOUNTER — Ambulatory Visit: Admitting: Sports Medicine

## 2024-03-24 ENCOUNTER — Other Ambulatory Visit (HOSPITAL_BASED_OUTPATIENT_CLINIC_OR_DEPARTMENT_OTHER): Payer: Self-pay

## 2024-03-24 MED ORDER — ZEPBOUND 15 MG/0.5ML ~~LOC~~ SOAJ
15.0000 mg | SUBCUTANEOUS | 0 refills | Status: DC
  Filled 2024-03-24: qty 6, 84d supply, fill #0
  Filled 2024-04-06: qty 2, 28d supply, fill #0
  Filled 2024-04-08: qty 6, 84d supply, fill #0

## 2024-03-30 NOTE — Progress Notes (Unsigned)
 Ben Jackson D[facility] [address] [place] [address] Phone: [phone]   Assessment and Plan:     1. Chronic bilateral thoracic back pain (Primary) 2. Chronic bilateral low back pain without sciatica 3. EDS (Ehlers-Danlos syndrome) 4. Neck pain 5. Somatic dysfunction of cervical region 6. Somatic dysfunction of thoracic region 7. Somatic dysfunction of lumbar region 8. Somatic dysfunction of pelvic region 9. Somatic dysfunction of rib region -Chronic with exacerbation, subsequent visit - Recurrent flare of continued multiple areas of musculoskeletal pain, primarily in neck and lower back.  Having short-term relief with OMT, HEP, physical therapy - Continue chronic pain medications including muscle relaxers and gabapentin  - Continue HEP, physical therapy - Continue evaluation and recommendations for EDS, POTS, MCAS with Dr. Joane - Patient has received relief with OMT in the past.  Elects for repeat OMT today.  Tolerated well per note below. - Decision today to treat with OMT was based on Physical Exam   After verbal consent patient was treated with HVLA (high velocity low amplitude), ME (muscle energy), FPR (flex positional release), ST (soft tissue), PC/PD (Pelvic Compression/ Pelvic Decompression) techniques in cervical, rib, thoracic, lumbar, and pelvic areas. Patient tolerated the procedure well with improvement in symptoms.  Patient educated on potential side effects of soreness and recommended to rest, hydrate, and use Tylenol  as needed for pain control.   Pertinent previous records reviewed include none  Follow Up: 4 weeks for reevaluation.  Could consider repeat OMT   Subjective:   I, Tenleigh Byer, am serving as a Neurosurgeon for Doctor Morene Mace   Chief Complaint: OMT   HPI:    10/30/2023 Patient is a 38 year old female with EDS. Patient states recent diagnosis EDS. Fall right before christmas off of 6 ft ladder. Hx of back,  knee, shoulder injuries. Whole back pain as of late. She feels crooked at the hips right hip especially. Back ,neck and shoulders are tight. She is in PT and they do dry needle and that has really helped.    11/27/2023 Patient states she is ready for tune up    12/24/2023 Patient states didn't get as much relied as the first visit. She is in flare now    01/21/2024 Patient states she is okay. Back is flared she is only comfortable laying on her side     02/12/2024 Patient states she is okay about the same    03/11/2024 Patient states not feeling great today. Back is getting worse and worse. Goes to merge and is focusing on one area at a time just the thoracic spine. Quality of life is not great right now. Hip back neck shoulders are painful. When she turns her head to the left the ROM is not the same as the right. Patient states that she can not sustain the adjustments from [facility]. Currently has pain in jaw as well.   03/31/2024 Patient states she is the same. Feels good for a day or two then everything is out of whack   Relevant Historical Information: Hypermobility, morbid obesity,  Additional pertinent review of systems negative.  Current Outpatient Medications  Medication Sig Dispense Refill   albuterol  (VENTOLIN  HFA) 108 (90 Base) MCG/ACT inhaler Inhale 2 puffs into the lungs every 4 (four) hours as needed for wheezing or shortness of breath. 1 each 3   BLISOVI FE 1/20 1-20 MG-MCG tablet Take 1 tablet by mouth daily.     COVID-19 mRNA bivalent vaccine, Pfizer, (PFIZER  COVID-19 VAC BIVALENT) injection Inject into the muscle. 0.3 mL 0   cyanocobalamin  (VITAMIN B12) 1000 MCG tablet Take by mouth.     cyclobenzaprine (FLEXERIL) 10 MG tablet TAKE 1 TABLET BY MOUTH THREE TIMES DAILY FOR 15 DAYS AS NEEDED FOR MUSCLE SPASMS     EPINEPHrine  0.3 mg/0.3 mL IJ SOAJ injection Inject 0.3 mg into the muscle as needed for anaphylaxis. 1 each 2   famotidine  (PEPCID ) 20 MG tablet TAKE 1 TABLET(20 MG)  BY MOUTH TWICE DAILY 180 tablet 0   fexofenadine  (ALLEGRA ) 180 MG tablet Take 1 tablet (180 mg total) by mouth daily. 90 tablet 1   FLUoxetine  (PROZAC ) 40 MG capsule Take 1 capsule (40 mg total) by mouth daily. 90 capsule 0   gabapentin  (NEURONTIN ) 300 MG capsule TAKE 1 CAPSULE BY MOUTH EVERY MORNING, 1 CAPSULE IN THE EVENING, AND 2 CAPSULES AT BEDTIME//Please call and make overdue appt for further refills 120 capsule 0   glycopyrrolate  (ROBINUL ) 1 MG tablet Take 1 tablet (1 mg total) by mouth in the morning, at noon, in the evening, and at bedtime. 120 tablet 4   HYDROcodone -acetaminophen  (NORCO/VICODIN) 5-325 MG tablet Take 1 tablet by mouth every 6 (six) hours as needed.     levocetirizine (XYZAL ) 5 MG tablet TAKE 1 TABLET(5 MG) BY MOUTH EVERY EVENING 90 tablet 1   Lisdexamfetamine Dimesylate  (VYVANSE ) 40 MG CHEW Chew 40 mg by mouth daily at 12 noon. 30 tablet 0   methocarbamol  (ROBAXIN ) 500 MG tablet Take 1 tablet (500 mg total) by mouth 2 (two) times daily as needed for muscle spasms. 60 tablet 0   montelukast  (SINGULAIR ) 10 MG tablet TAKE 1 TABLET(10 MG) BY MOUTH AT BEDTIME. NEED APPOINTMENT FOR ADDITIONAL REFILLS. 90 tablet 1   norethindrone -ethinyl estradiol (LOESTRIN) 1-20 MG-MCG tablet Take 1 tablet by mouth daily.     omeprazole (PRILOSEC) 20 MG capsule 1 capsule 30 minutes before morning meal     ondansetron  (ZOFRAN  ODT) 4 MG disintegrating tablet Take 1 tablet (4 mg total) by mouth every 8 (eight) hours as needed for nausea or vomiting. 20 tablet 0   predniSONE  (STERAPRED UNI-PAK 48 TAB) 5 MG (48) TBPK tablet 12 day dosepack po 48 tablet 0   Semaglutide -Weight Management 1 MG/0.5ML SOAJ Inject 1 mg into the skin once a week. 6 mL 0   Short Ragweed Pollen Ext (RAGWITEK) 12 AMB A 1-U SUBL Place 1 tablet under the tongue daily. Bring this prescription to your appointment and you will take the first SL tablet in the clinic. 30 tablet 5   SYRINGE-NEEDLE, DISP, 3 ML (EASYPOINT NEEDLE/SYRINGE)  25G X 5/8 3 ML MISC Use one needle/syringe to inject Vitamin B12 into the muscle once every 7 days. *Disp. whatever insurance will cover*     tirzepatide  (ZEPBOUND ) 10 MG/0.5ML Pen Inject 10 mg into the skin once a week. 2 mL 0   tirzepatide  (ZEPBOUND ) 10 MG/0.5ML Pen Inject 10 mg into the skin every 7 (seven) days. 6 mL 0   tirzepatide  (ZEPBOUND ) 12.5 MG/0.5ML Pen Inject 12.5 mg into the skin every 7 (seven) days. 2 mL 0   tirzepatide  (ZEPBOUND ) 12.5 MG/0.5ML Pen Inject 12.5 mg into the skin once a week. 6 mL 1   tirzepatide  (ZEPBOUND ) 15 MG/0.5ML Pen Inject 15 mg into the skin once a week. 2 mL 0   tirzepatide  (ZEPBOUND ) 15 MG/0.5ML Pen Inject 15 mg into the skin once a week. 6 mL 0   tirzepatide  (ZEPBOUND ) 5 MG/0.5ML injection vial Inject  5 mg into the skin once a week. 6 mL 0   tirzepatide  (ZEPBOUND ) 5 MG/0.5ML Pen Inject 5 mg into the skin once a week. 6 mL 0   tirzepatide  (ZEPBOUND ) 7.5 MG/0.5ML Pen Inject 7.5 mg into the skin once a week. 6 mL 0   traMADol (ULTRAM) 50 MG tablet TAKE 1 TABLET BY MOUTH THREE TIMES DAILY FOR 5 DAYS AS NEEDED     traZODone (DESYREL) 100 MG tablet Take 100 mg by mouth at bedtime.     Vitamin D , Ergocalciferol , (DRISDOL ) 1.25 MG (50000 UNIT) CAPS capsule Take 1 capsule (50,000 Units total) by mouth every 7 (seven) days. 4 capsule 0   XIIDRA 5 % SOLN      No current facility-administered medications for this visit.      Objective:     Vitals:   03/31/24 1544  Pulse: (!) 53  SpO2: 98%  Weight: 251 lb (113.9 kg)  Height: 5' 1 (1.549 m)      Body mass index is 47.43 kg/m.    Physical Exam:     General: Well-appearing, cooperative, sitting comfortably in no acute distress.   OMT Physical Exam:  ASIS Compression Test: Positive Right Cervical: TTP paraspinal, C5 RRSR Rib: Bilateral elevated first rib with TTP Thoracic: TTP paraspinal, T4-6 RRSL, T7-9 RLSR Lumbar: TTP paraspinal, L2 RLSL Pelvis: Right anterior innominate  Electronically signed  by:  Odis Mace [facility] 4:24 PM 03/31/24

## 2024-03-31 ENCOUNTER — Other Ambulatory Visit: Payer: Self-pay | Admitting: Dermatology

## 2024-03-31 ENCOUNTER — Ambulatory Visit (INDEPENDENT_AMBULATORY_CARE_PROVIDER_SITE_OTHER): Admitting: Sports Medicine

## 2024-03-31 VITALS — HR 53 | Ht 61.0 in | Wt 251.0 lb

## 2024-03-31 DIAGNOSIS — M9905 Segmental and somatic dysfunction of pelvic region: Secondary | ICD-10-CM | POA: Diagnosis not present

## 2024-03-31 DIAGNOSIS — M546 Pain in thoracic spine: Secondary | ICD-10-CM

## 2024-03-31 DIAGNOSIS — M9902 Segmental and somatic dysfunction of thoracic region: Secondary | ICD-10-CM

## 2024-03-31 DIAGNOSIS — G8929 Other chronic pain: Secondary | ICD-10-CM

## 2024-03-31 DIAGNOSIS — M545 Low back pain, unspecified: Secondary | ICD-10-CM

## 2024-03-31 DIAGNOSIS — M542 Cervicalgia: Secondary | ICD-10-CM | POA: Diagnosis not present

## 2024-03-31 DIAGNOSIS — M9901 Segmental and somatic dysfunction of cervical region: Secondary | ICD-10-CM

## 2024-03-31 DIAGNOSIS — Q796 Ehlers-Danlos syndrome, unspecified: Secondary | ICD-10-CM

## 2024-03-31 DIAGNOSIS — M9903 Segmental and somatic dysfunction of lumbar region: Secondary | ICD-10-CM | POA: Diagnosis not present

## 2024-03-31 DIAGNOSIS — M9908 Segmental and somatic dysfunction of rib cage: Secondary | ICD-10-CM

## 2024-03-31 DIAGNOSIS — L74519 Primary focal hyperhidrosis, unspecified: Secondary | ICD-10-CM

## 2024-03-31 MED ORDER — GLYCOPYRROLATE 1 MG PO TABS: 1.0000 mg | ORAL_TABLET | Freq: Four times a day (QID) | ORAL | 4 refills | Status: DC

## 2024-03-31 NOTE — Progress Notes (Signed)
 Patient needed a refill with 4mg  per day

## 2024-04-06 ENCOUNTER — Other Ambulatory Visit (HOSPITAL_BASED_OUTPATIENT_CLINIC_OR_DEPARTMENT_OTHER): Payer: Self-pay

## 2024-04-06 NOTE — Progress Notes (Deleted)
 Ben Jackson D[facility] [address] [place] [address] Phone: [phone]   Assessment and Plan:     *** - Patient has received relief with OMT in the past.  Elects for repeat OMT today.  Tolerated well per note below. - Decision today to treat with OMT was based on Physical Exam   After verbal consent patient was treated with HVLA (high velocity low amplitude), ME (muscle energy), FPR (flex positional release), ST (soft tissue), PC/PD (Pelvic Compression/ Pelvic Decompression) techniques in cervical, rib, thoracic, lumbar, and pelvic areas. Patient tolerated the procedure well with improvement in symptoms.  Patient educated on potential side effects of soreness and recommended to rest, hydrate, and use Tylenol  as needed for pain control.   Pertinent previous records reviewed include ***    Follow Up: ***     Subjective:   I, Geneive Sandstrom, am serving as a Neurosurgeon for Doctor Morene Mace  Chief Complaint: OMT   HPI:    10/30/2023 Patient is a 38 year old female with EDS. Patient states recent diagnosis EDS. Fall right before christmas off of 6 ft ladder. Hx of back, knee, shoulder injuries. Whole back pain as of late. She feels crooked at the hips right hip especially. Back ,neck and shoulders are tight. She is in PT and they do dry needle and that has really helped.    11/27/2023 Patient states she is ready for tune up    12/24/2023 Patient states didn't get as much relied as the first visit. She is in flare now    01/21/2024 Patient states she is okay. Back is flared she is only comfortable laying on her side     02/12/2024 Patient states she is okay about the same    03/11/2024 Patient states not feeling great today. Back is getting worse and worse. Goes to merge and is focusing on one area at a time just the thoracic spine. Quality of life is not great right now. Hip back neck shoulders are painful. When she turns her head to the left the ROM  is not the same as the right. Patient states that she can not sustain the adjustments from [facility]. Currently has pain in jaw as well.   03/31/2024 Patient states she is the same. Feels good for a day or two then everything is out of whack   04/07/2024 Patient states   Relevant Historical Information: Hypermobility, morbid obesity,    Additional pertinent review of systems negative.  Current Outpatient Medications  Medication Sig Dispense Refill   albuterol  (VENTOLIN  HFA) 108 (90 Base) MCG/ACT inhaler Inhale 2 puffs into the lungs every 4 (four) hours as needed for wheezing or shortness of breath. 1 each 3   BLISOVI FE 1/20 1-20 MG-MCG tablet Take 1 tablet by mouth daily.     COVID-19 mRNA bivalent vaccine, Pfizer, (PFIZER COVID-19 VAC BIVALENT) injection Inject into the muscle. 0.3 mL 0   cyanocobalamin  (VITAMIN B12) 1000 MCG tablet Take by mouth.     cyclobenzaprine (FLEXERIL) 10 MG tablet TAKE 1 TABLET BY MOUTH THREE TIMES DAILY FOR 15 DAYS AS NEEDED FOR MUSCLE SPASMS     EPINEPHrine  0.3 mg/0.3 mL IJ SOAJ injection Inject 0.3 mg into the muscle as needed for anaphylaxis. 1 each 2   famotidine  (PEPCID ) 20 MG tablet TAKE 1 TABLET(20 MG) BY MOUTH TWICE DAILY 180 tablet 0   fexofenadine  (ALLEGRA ) 180 MG tablet Take 1 tablet (180 mg total) by mouth daily. 90  tablet 1   FLUoxetine  (PROZAC ) 40 MG capsule Take 1 capsule (40 mg total) by mouth daily. 90 capsule 0   gabapentin  (NEURONTIN ) 300 MG capsule TAKE 1 CAPSULE BY MOUTH EVERY MORNING, 1 CAPSULE IN THE EVENING, AND 2 CAPSULES AT BEDTIME//Please call and make overdue appt for further refills 120 capsule 0   glycopyrrolate  (ROBINUL ) 1 MG tablet Take 1 tablet (1 mg total) by mouth in the morning, at noon, in the evening, and at bedtime. 120 tablet 4   HYDROcodone -acetaminophen  (NORCO/VICODIN) 5-325 MG tablet Take 1 tablet by mouth every 6 (six) hours as needed.     levocetirizine (XYZAL ) 5 MG tablet TAKE 1 TABLET(5 MG) BY MOUTH EVERY EVENING  90 tablet 1   Lisdexamfetamine Dimesylate  (VYVANSE ) 40 MG CHEW Chew 40 mg by mouth daily at 12 noon. 30 tablet 0   methocarbamol  (ROBAXIN ) 500 MG tablet Take 1 tablet (500 mg total) by mouth 2 (two) times daily as needed for muscle spasms. 60 tablet 0   montelukast  (SINGULAIR ) 10 MG tablet TAKE 1 TABLET(10 MG) BY MOUTH AT BEDTIME. NEED APPOINTMENT FOR ADDITIONAL REFILLS. 90 tablet 1   norethindrone -ethinyl estradiol (LOESTRIN) 1-20 MG-MCG tablet Take 1 tablet by mouth daily.     omeprazole (PRILOSEC) 20 MG capsule 1 capsule 30 minutes before morning meal     ondansetron  (ZOFRAN  ODT) 4 MG disintegrating tablet Take 1 tablet (4 mg total) by mouth every 8 (eight) hours as needed for nausea or vomiting. 20 tablet 0   predniSONE  (STERAPRED UNI-PAK 48 TAB) 5 MG (48) TBPK tablet 12 day dosepack po 48 tablet 0   Semaglutide -Weight Management 1 MG/0.5ML SOAJ Inject 1 mg into the skin once a week. 6 mL 0   Short Ragweed Pollen Ext (RAGWITEK) 12 AMB A 1-U SUBL Place 1 tablet under the tongue daily. Bring this prescription to your appointment and you will take the first SL tablet in the clinic. 30 tablet 5   SYRINGE-NEEDLE, DISP, 3 ML (EASYPOINT NEEDLE/SYRINGE) 25G X 5/8 3 ML MISC Use one needle/syringe to inject Vitamin B12 into the muscle once every 7 days. *Disp. whatever insurance will cover*     tirzepatide  (ZEPBOUND ) 10 MG/0.5ML Pen Inject 10 mg into the skin once a week. 2 mL 0   tirzepatide  (ZEPBOUND ) 10 MG/0.5ML Pen Inject 10 mg into the skin every 7 (seven) days. 6 mL 0   tirzepatide  (ZEPBOUND ) 12.5 MG/0.5ML Pen Inject 12.5 mg into the skin every 7 (seven) days. 2 mL 0   tirzepatide  (ZEPBOUND ) 12.5 MG/0.5ML Pen Inject 12.5 mg into the skin once a week. 6 mL 1   tirzepatide  (ZEPBOUND ) 15 MG/0.5ML Pen Inject 15 mg into the skin once a week. 2 mL 0   tirzepatide  (ZEPBOUND ) 15 MG/0.5ML Pen Inject 15 mg into the skin once a week. 6 mL 0   tirzepatide  (ZEPBOUND ) 5 MG/0.5ML injection vial Inject 5 mg into  the skin once a week. 6 mL 0   tirzepatide  (ZEPBOUND ) 5 MG/0.5ML Pen Inject 5 mg into the skin once a week. 6 mL 0   tirzepatide  (ZEPBOUND ) 7.5 MG/0.5ML Pen Inject 7.5 mg into the skin once a week. 6 mL 0   traMADol (ULTRAM) 50 MG tablet TAKE 1 TABLET BY MOUTH THREE TIMES DAILY FOR 5 DAYS AS NEEDED     traZODone (DESYREL) 100 MG tablet Take 100 mg by mouth at bedtime.     Vitamin D , Ergocalciferol , (DRISDOL ) 1.25 MG (50000 UNIT) CAPS capsule Take 1 capsule (50,000 Units total) by  mouth every 7 (seven) days. 4 capsule 0   XIIDRA 5 % SOLN      No current facility-administered medications for this visit.      Objective:     There were no vitals filed for this visit.    There is no height or weight on file to calculate BMI.    Physical Exam:     General: Well-appearing, cooperative, sitting comfortably in no acute distress.   OMT Physical Exam:  ASIS Compression Test: Positive Right Cervical: TTP paraspinal, *** Rib: Bilateral elevated first rib with TTP Thoracic: TTP paraspinal,*** Lumbar: TTP paraspinal,*** Pelvis: Right anterior innominate  Electronically signed by:  Odis Mace [facility] 7:43 AM 04/06/24

## 2024-04-07 ENCOUNTER — Ambulatory Visit: Admitting: Sports Medicine

## 2024-04-08 ENCOUNTER — Other Ambulatory Visit (HOSPITAL_BASED_OUTPATIENT_CLINIC_OR_DEPARTMENT_OTHER): Payer: Self-pay

## 2024-04-10 ENCOUNTER — Other Ambulatory Visit (HOSPITAL_BASED_OUTPATIENT_CLINIC_OR_DEPARTMENT_OTHER): Payer: Self-pay

## 2024-04-16 ENCOUNTER — Ambulatory Visit: Admitting: Allergy & Immunology

## 2024-04-22 ENCOUNTER — Other Ambulatory Visit: Payer: Self-pay | Admitting: Family Medicine

## 2024-04-29 ENCOUNTER — Ambulatory Visit: Admitting: Obstetrics and Gynecology

## 2024-04-29 NOTE — Progress Notes (Deleted)
 38 y.o. G0P0000 Single Caucasian female here for annual exam.    PCP: Katina Pfeiffer, PA-C   No LMP recorded. (Menstrual status: Oral contraceptives).           Sexually active: No.  The current method of family planning is OCP (estrogen/progesterone).    Menopausal hormone therapy:  n/a Exercising: {yes no:314532}  {types:19826} Smoker:  no  OB History  Gravida Para Term Preterm AB Living  0 0 0 0 0 0  SAB IAB Ectopic Multiple Live Births  0 0 0 0 0     HEALTH MAINTENANCE: Last 2 paps:  10/31/23 neg HR HPV neg, 08/04/15 neg  History of abnormal Pap or positive HPV:  no Mammogram:   n/a Colonoscopy:  n/a Bone Density:  n/a  Result  n/a   Immunization History  Administered Date(s) Administered   HPV Quadrivalent 07/02/2011, 01/09/2012   Influenza-Unspecified 05/17/2017   PFIZER(Purple Top)SARS-COV-2 Vaccination 10/09/2019, 11/03/2019, 06/24/2020   Pfizer Covid-19 Vaccine Bivalent Booster 22yrs & up 06/20/2021      reports that she has never smoked. She has never used smokeless tobacco. She reports current alcohol use. She reports that she does not use drugs.  Past Medical History:  Diagnosis Date   ADHD (attention deficit hyperactivity disorder)    Allergic rhinitis    Anxiety    Asthma    Back injury    COMPETITIVE INJURY IN HIGH SCHOOL   Back pain    Depression    Ehlers-Danlos syndrome    Fatty liver    Fibromyalgia    Gallbladder problem    GERD (gastroesophageal reflux disease)    IBS (irritable bowel syndrome)    Insulin  resistance    Joint pain    Kidney stones    Migraines    no aura   OA (osteoarthritis) of knee    Obesity    Pneumonia    PONV (postoperative nausea and vomiting)    Seasonal allergies    Vitamin D  deficiency     Past Surgical History:  Procedure Laterality Date   CHOLECYSTECTOMY     DG DILATION URETERS     KNEE ARTHROSCOPY Left    KNEE ARTHROSCOPY WITH LATERAL MENISECTOMY Right 04/11/2018   Procedure: RIGHT KNEE  ARTHROSCOPY WITH PARTIAL MENISECTOMY CHONDROPLASTY;  Surgeon: Gerome Charleston, MD;  Location: [facility];  Service: Orthopedics;  Laterality: Right;   MOUTH SURGERY     SHOULDER SURGERY  2002   RIGHT   WISDOM TOOTH EXTRACTION     WRIST ARTHROSCOPY     Right    Current Outpatient Medications  Medication Sig Dispense Refill   albuterol  (VENTOLIN  HFA) 108 (90 Base) MCG/ACT inhaler Inhale 2 puffs into the lungs every 4 (four) hours as needed for wheezing or shortness of breath. 1 each 3   ALLERGY  RELIEF 180 MG tablet TAKE 1 TABLET(180 MG) BY MOUTH DAILY 90 tablet 1   BLISOVI FE 1/20 1-20 MG-MCG tablet Take 1 tablet by mouth daily.     COVID-19 mRNA bivalent vaccine, Pfizer, (PFIZER COVID-19 VAC BIVALENT) injection Inject into the muscle. 0.3 mL 0   cyanocobalamin  (VITAMIN B12) 1000 MCG tablet Take by mouth.     cyclobenzaprine (FLEXERIL) 10 MG tablet TAKE 1 TABLET BY MOUTH THREE TIMES DAILY FOR 15 DAYS AS NEEDED FOR MUSCLE SPASMS     EPINEPHrine  0.3 mg/0.3 mL IJ SOAJ injection Inject 0.3 mg into the muscle as needed for anaphylaxis. 1 each 2   famotidine  (PEPCID ) 20 MG tablet TAKE  1 TABLET(20 MG) BY MOUTH TWICE DAILY 180 tablet 0   FLUoxetine  (PROZAC ) 40 MG capsule Take 1 capsule (40 mg total) by mouth daily. 90 capsule 0   gabapentin  (NEURONTIN ) 300 MG capsule TAKE 1 CAPSULE BY MOUTH EVERY MORNING, 1 CAPSULE IN THE EVENING, AND 2 CAPSULES AT BEDTIME//Please call and make overdue appt for further refills 120 capsule 0   glycopyrrolate  (ROBINUL ) 1 MG tablet Take 1 tablet (1 mg total) by mouth in the morning, at noon, in the evening, and at bedtime. 120 tablet 4   HYDROcodone -acetaminophen  (NORCO/VICODIN) 5-325 MG tablet Take 1 tablet by mouth every 6 (six) hours as needed.     levocetirizine (XYZAL ) 5 MG tablet TAKE 1 TABLET(5 MG) BY MOUTH EVERY EVENING 90 tablet 1   Lisdexamfetamine Dimesylate  (VYVANSE ) 40 MG CHEW Chew 40 mg by mouth daily at 12 noon. 30 tablet 0   methocarbamol  (ROBAXIN ) 500 MG  tablet Take 1 tablet (500 mg total) by mouth 2 (two) times daily as needed for muscle spasms. 60 tablet 0   montelukast  (SINGULAIR ) 10 MG tablet TAKE 1 TABLET(10 MG) BY MOUTH AT BEDTIME. NEED APPOINTMENT FOR ADDITIONAL REFILLS. 90 tablet 1   norethindrone -ethinyl estradiol (LOESTRIN) 1-20 MG-MCG tablet Take 1 tablet by mouth daily.     omeprazole (PRILOSEC) 20 MG capsule 1 capsule 30 minutes before morning meal     ondansetron  (ZOFRAN  ODT) 4 MG disintegrating tablet Take 1 tablet (4 mg total) by mouth every 8 (eight) hours as needed for nausea or vomiting. 20 tablet 0   predniSONE  (STERAPRED UNI-PAK 48 TAB) 5 MG (48) TBPK tablet 12 day dosepack po 48 tablet 0   RAGWITEK 12 AMB A 1-U SUBL DISSOLVE 1 TABLET UNDER THE TONGUE DAILY. BRING THIS PERSON TO YOUR APPOINTMENT AND YOU WILL TAKE THE 1ST TABLET IN THE CLINIC 30 tablet 5   Semaglutide -Weight Management 1 MG/0.5ML SOAJ Inject 1 mg into the skin once a week. 6 mL 0   SYRINGE-NEEDLE, DISP, 3 ML (EASYPOINT NEEDLE/SYRINGE) 25G X 5/8 3 ML MISC Use one needle/syringe to inject Vitamin B12 into the muscle once every 7 days. *Disp. whatever insurance will cover*     tirzepatide  (ZEPBOUND ) 10 MG/0.5ML Pen Inject 10 mg into the skin once a week. 2 mL 0   tirzepatide  (ZEPBOUND ) 10 MG/0.5ML Pen Inject 10 mg into the skin every 7 (seven) days. 6 mL 0   tirzepatide  (ZEPBOUND ) 12.5 MG/0.5ML Pen Inject 12.5 mg into the skin every 7 (seven) days. 2 mL 0   tirzepatide  (ZEPBOUND ) 12.5 MG/0.5ML Pen Inject 12.5 mg into the skin once a week. 6 mL 1   tirzepatide  (ZEPBOUND ) 15 MG/0.5ML Pen Inject 15 mg into the skin once a week. 2 mL 0   tirzepatide  (ZEPBOUND ) 15 MG/0.5ML Pen Inject 15 mg into the skin once a week. 6 mL 0   tirzepatide  (ZEPBOUND ) 5 MG/0.5ML injection vial Inject 5 mg into the skin once a week. 6 mL 0   tirzepatide  (ZEPBOUND ) 5 MG/0.5ML Pen Inject 5 mg into the skin once a week. 6 mL 0   tirzepatide  (ZEPBOUND ) 7.5 MG/0.5ML Pen Inject 7.5 mg into the  skin once a week. 6 mL 0   traMADol (ULTRAM) 50 MG tablet TAKE 1 TABLET BY MOUTH THREE TIMES DAILY FOR 5 DAYS AS NEEDED     traZODone (DESYREL) 100 MG tablet Take 100 mg by mouth at bedtime.     Vitamin D , Ergocalciferol , (DRISDOL ) 1.25 MG (50000 UNIT) CAPS capsule Take 1 capsule (50,000  Units total) by mouth every 7 (seven) days. 4 capsule 0   XIIDRA 5 % SOLN      No current facility-administered medications for this visit.    ALLERGIES: Aquacel [carboxymethylcellulose], Tobramycin, Penicillins, Silicone, Adhesive [tape], Latex, and Oxycodone   Family History  Problem Relation Age of Onset   Multiple sclerosis Mother    Diabetes Mother    Hyperlipidemia Mother    Thyroid disease Mother    Depression Mother    Anxiety disorder Mother    Obesity Mother    Allergic rhinitis Mother    Hypertension Father    Diabetes Father    Hyperlipidemia Father    Obesity Father    Allergic rhinitis Father    Diabetes Maternal Grandfather    Cancer Maternal Grandfather        prostate   Heart disease Maternal Grandfather    Heart disease Paternal Grandfather    Allergic rhinitis Paternal Aunt    Allergic rhinitis Maternal Grandmother    Asthma Neg Hx    Eczema Neg Hx    Urticaria Neg Hx     Review of Systems  PHYSICAL EXAM:  There were no vitals taken for this visit.    General appearance: alert, cooperative and appears stated age Head: normocephalic, without obvious abnormality, atraumatic Neck: no adenopathy, supple, symmetrical, trachea midline and thyroid normal to inspection and palpation Lungs: clear to auscultation bilaterally Breasts: normal appearance, no masses or tenderness, No nipple retraction or dimpling, No nipple discharge or bleeding, No axillary adenopathy Heart: regular rate and rhythm Abdomen: soft, non-tender; no masses, no organomegaly Extremities: extremities normal, atraumatic, no cyanosis or edema Skin: skin color, texture, turgor normal. No rashes or  lesions Lymph nodes: cervical, supraclavicular, and axillary nodes normal. Neurologic: grossly normal  Pelvic: External genitalia:  no lesions              No abnormal inguinal nodes palpated.              Urethra:  normal appearing urethra with no masses, tenderness or lesions              Bartholins and Skenes: normal                 Vagina: normal appearing vagina with normal color and discharge, no lesions              Cervix: no lesions              Pap taken: {yes no:314532} Bimanual Exam:  Uterus:  normal size, contour, position, consistency, mobility, non-tender              Adnexa: no mass, fullness, tenderness              Rectal exam: {yes no:314532}.  Confirms.              Anus:  normal sphincter tone, no lesions  Chaperone was present for exam:  {BSCHAPERONE:31226::Emily F, CMA}  ASSESSMENT: Well woman visit with gynecologic exam.  PHQ-2-9: ***  ***  PLAN: Mammogram screening discussed. Self breast awareness reviewed. Pap and HRV collected:  {yes no:314532} Guidelines for Calcium, Vitamin D , regular exercise program including cardiovascular and weight bearing exercise. Medication refills:  *** {LABS (Optional):23779} Follow up:  ***    Additional counseling given.  {yes [id number]. ***  total time was spent for this patient encounter, including preparation, face-to-face counseling with the patient, coordination of care, and documentation of the encounter in addition to doing the  well woman visit with gynecologic exam.

## 2024-05-04 ENCOUNTER — Encounter: Payer: Self-pay | Admitting: Obstetrics and Gynecology

## 2024-05-04 ENCOUNTER — Ambulatory Visit (INDEPENDENT_AMBULATORY_CARE_PROVIDER_SITE_OTHER): Admitting: Obstetrics and Gynecology

## 2024-05-04 VITALS — BP 122/78 | HR 130 | Ht 63.0 in | Wt 245.0 lb

## 2024-05-04 DIAGNOSIS — Z1331 Encounter for screening for depression: Secondary | ICD-10-CM

## 2024-05-04 DIAGNOSIS — Z01419 Encounter for gynecological examination (general) (routine) without abnormal findings: Secondary | ICD-10-CM | POA: Diagnosis not present

## 2024-05-04 DIAGNOSIS — R3989 Other symptoms and signs involving the genitourinary system: Secondary | ICD-10-CM

## 2024-05-04 MED ORDER — NORETHINDRONE ACET-ETHINYL EST 1-20 MG-MCG PO TABS: 1.0000 | ORAL_TABLET | Freq: Every day | ORAL | 3 refills | Status: DC

## 2024-05-04 NOTE — Progress Notes (Unsigned)
 38 y.o. G0P0000 Single Caucasian female here for annual exam. Would like for us  to take over her birth control prescription.   Takes birth control to control her heavy cycles.   Not a smoker.  Denies HTN.  Has migraines and no aura.  They are are rare.  Not being treated for breast or liver disease. No personal hx of thromboembolic events.    Taking Zepbound .    She is aware that there is an interaction between Zepbound  and her birth control and that she may need additional contraception for sexual activity.  Notes her urine was amber in color recently.  Color has lightened with hydration.  No dysuria.   Will see a neurosurgeon this week.    Has Elhers-Danlos.  Being evaluated for POTS.  PCP: Katina Pfeiffer, PA-C   No LMP recorded. (Menstrual status: Oral contraceptives).           Sexually active: No.  The current method of family planning is OCP (estrogen/progesterone).    Menopausal hormone therapy:  n/a Exercising: Yes.    Walking  Smoker:  no  OB History  Gravida Para Term Preterm AB Living  0 0 0 0 0 0  SAB IAB Ectopic Multiple Live Births  0 0 0 0 0     HEALTH MAINTENANCE: Last 2 paps:  10/31/23 neg HR HPV neg, 08/04/15 neg  History of abnormal Pap or positive HPV:  no Mammogram:   n/a Colonoscopy:  n/a Bone Density:  n/a  Result  n/a   Immunization History  Administered Date(s) Administered   HPV Quadrivalent 07/02/2011, 01/09/2012   Influenza-Unspecified 05/17/2017   PFIZER(Purple Top)SARS-COV-2 Vaccination 10/09/2019, 11/03/2019, 06/24/2020   Pfizer Covid-19 Vaccine Bivalent Booster 2yrs & up 06/20/2021      reports that she has never smoked. She has never used smokeless tobacco. She reports current alcohol use. She reports that she does not use drugs.  Past Medical History:  Diagnosis Date   ADHD (attention deficit hyperactivity disorder)    Allergic rhinitis    Anxiety    Asthma    Back injury    COMPETITIVE INJURY IN HIGH SCHOOL    Back pain    Depression    Ehlers-Danlos syndrome    Fatty liver    Fibromyalgia    Gallbladder problem    GERD (gastroesophageal reflux disease)    IBS (irritable bowel syndrome)    Insulin  resistance    Joint pain    Kidney stones    Migraines    no aura   OA (osteoarthritis) of knee    Obesity    Pneumonia    PONV (postoperative nausea and vomiting)    Seasonal allergies    Vitamin D  deficiency     Past Surgical History:  Procedure Laterality Date   CHOLECYSTECTOMY     DG DILATION URETERS     KNEE ARTHROSCOPY Left    KNEE ARTHROSCOPY WITH LATERAL MENISECTOMY Right 04/11/2018   Procedure: RIGHT KNEE ARTHROSCOPY WITH PARTIAL MENISECTOMY CHONDROPLASTY;  Surgeon: Gerome Charleston, MD;  Location: [facility];  Service: Orthopedics;  Laterality: Right;   MOUTH SURGERY     SHOULDER SURGERY  2002   RIGHT   WISDOM TOOTH EXTRACTION     WRIST ARTHROSCOPY     Right    Current Outpatient Medications  Medication Sig Dispense Refill   albuterol  (VENTOLIN  HFA) 108 (90 Base) MCG/ACT inhaler Inhale 2 puffs into the lungs every 4 (four) hours as needed for wheezing or shortness of breath.  1 each 3   ALLERGY  RELIEF 180 MG tablet TAKE 1 TABLET(180 MG) BY MOUTH DAILY 90 tablet 1   BLISOVI FE 1/20 1-20 MG-MCG tablet Take 1 tablet by mouth daily.     COVID-19 mRNA bivalent vaccine, Pfizer, (PFIZER COVID-19 VAC BIVALENT) injection Inject into the muscle. 0.3 mL 0   cyanocobalamin  (VITAMIN B12) 1000 MCG tablet Take by mouth.     cyclobenzaprine (FLEXERIL) 10 MG tablet TAKE 1 TABLET BY MOUTH THREE TIMES DAILY FOR 15 DAYS AS NEEDED FOR MUSCLE SPASMS     EPINEPHrine  0.3 mg/0.3 mL IJ SOAJ injection Inject 0.3 mg into the muscle as needed for anaphylaxis. 1 each 2   famotidine  (PEPCID ) 20 MG tablet TAKE 1 TABLET(20 MG) BY MOUTH TWICE DAILY 180 tablet 0   FLUoxetine  (PROZAC ) 40 MG capsule Take 1 capsule (40 mg total) by mouth daily. 90 capsule 0   gabapentin  (NEURONTIN ) 300 MG capsule TAKE 1 CAPSULE BY  MOUTH EVERY MORNING, 1 CAPSULE IN THE EVENING, AND 2 CAPSULES AT BEDTIME//Please call and make overdue appt for further refills 120 capsule 0   glycopyrrolate  (ROBINUL ) 1 MG tablet Take 1 tablet (1 mg total) by mouth in the morning, at noon, in the evening, and at bedtime. 120 tablet 4   HYDROcodone -acetaminophen  (NORCO/VICODIN) 5-325 MG tablet Take 1 tablet by mouth every 6 (six) hours as needed.     levocetirizine (XYZAL ) 5 MG tablet TAKE 1 TABLET(5 MG) BY MOUTH EVERY EVENING 90 tablet 1   Lisdexamfetamine Dimesylate  (VYVANSE ) 40 MG CHEW Chew 40 mg by mouth daily at 12 noon. 30 tablet 0   methocarbamol  (ROBAXIN ) 500 MG tablet Take 1 tablet (500 mg total) by mouth 2 (two) times daily as needed for muscle spasms. 60 tablet 0   montelukast  (SINGULAIR ) 10 MG tablet TAKE 1 TABLET(10 MG) BY MOUTH AT BEDTIME. NEED APPOINTMENT FOR ADDITIONAL REFILLS. 90 tablet 1   norethindrone -ethinyl estradiol (LOESTRIN) 1-20 MG-MCG tablet Take 1 tablet by mouth daily.     omeprazole (PRILOSEC) 20 MG capsule 1 capsule 30 minutes before morning meal     ondansetron  (ZOFRAN  ODT) 4 MG disintegrating tablet Take 1 tablet (4 mg total) by mouth every 8 (eight) hours as needed for nausea or vomiting. 20 tablet 0   predniSONE  (STERAPRED UNI-PAK 48 TAB) 5 MG (48) TBPK tablet 12 day dosepack po 48 tablet 0   RAGWITEK 12 AMB A 1-U SUBL DISSOLVE 1 TABLET UNDER THE TONGUE DAILY. BRING THIS PERSON TO YOUR APPOINTMENT AND YOU WILL TAKE THE 1ST TABLET IN THE CLINIC 30 tablet 5   Semaglutide -Weight Management 1 MG/0.5ML SOAJ Inject 1 mg into the skin once a week. 6 mL 0   SYRINGE-NEEDLE, DISP, 3 ML (EASYPOINT NEEDLE/SYRINGE) 25G X 5/8 3 ML MISC Use one needle/syringe to inject Vitamin B12 into the muscle once every 7 days. *Disp. whatever insurance will cover*     tirzepatide  (ZEPBOUND ) 10 MG/0.5ML Pen Inject 10 mg into the skin once a week. 2 mL 0   tirzepatide  (ZEPBOUND ) 10 MG/0.5ML Pen Inject 10 mg into the skin every 7 (seven) days.  6 mL 0   tirzepatide  (ZEPBOUND ) 12.5 MG/0.5ML Pen Inject 12.5 mg into the skin every 7 (seven) days. 2 mL 0   tirzepatide  (ZEPBOUND ) 12.5 MG/0.5ML Pen Inject 12.5 mg into the skin once a week. 6 mL 1   tirzepatide  (ZEPBOUND ) 15 MG/0.5ML Pen Inject 15 mg into the skin once a week. 2 mL 0   tirzepatide  (ZEPBOUND ) 15 MG/0.5ML Pen Inject  15 mg into the skin once a week. 6 mL 0   tirzepatide  (ZEPBOUND ) 5 MG/0.5ML injection vial Inject 5 mg into the skin once a week. 6 mL 0   tirzepatide  (ZEPBOUND ) 5 MG/0.5ML Pen Inject 5 mg into the skin once a week. 6 mL 0   tirzepatide  (ZEPBOUND ) 7.5 MG/0.5ML Pen Inject 7.5 mg into the skin once a week. 6 mL 0   traMADol (ULTRAM) 50 MG tablet TAKE 1 TABLET BY MOUTH THREE TIMES DAILY FOR 5 DAYS AS NEEDED     traZODone (DESYREL) 100 MG tablet Take 100 mg by mouth at bedtime.     Vitamin D , Ergocalciferol , (DRISDOL ) 1.25 MG (50000 UNIT) CAPS capsule Take 1 capsule (50,000 Units total) by mouth every 7 (seven) days. 4 capsule 0   XIIDRA 5 % SOLN      No current facility-administered medications for this visit.    ALLERGIES: Aquacel [carboxymethylcellulose], Tobramycin, Penicillins, Silicone, Adhesive [tape], Latex, and Oxycodone   Family History  Problem Relation Age of Onset   Multiple sclerosis Mother    Diabetes Mother    Hyperlipidemia Mother    Thyroid disease Mother    Depression Mother    Anxiety disorder Mother    Obesity Mother    Allergic rhinitis Mother    Hypertension Father    Diabetes Father    Hyperlipidemia Father    Obesity Father    Allergic rhinitis Father    Diabetes Maternal Grandfather    Cancer Maternal Grandfather        prostate   Heart disease Maternal Grandfather    Heart disease Paternal Grandfather    Allergic rhinitis Paternal Aunt    Allergic rhinitis Maternal Grandmother    Asthma Neg Hx    Eczema Neg Hx    Urticaria Neg Hx     Review of Systems  All other systems reviewed and are negative.   PHYSICAL EXAM:   BP 122/78 (BP Location: Left Arm, Patient Position: Sitting)   Pulse (!) 130   Ht 5' 3 (1.6 m)   Wt 245 lb (111.1 kg)   SpO2 97%   BMI 43.40 kg/m     General appearance: alert, cooperative and appears stated age Head: normocephalic, without obvious abnormality, atraumatic Neck: no adenopathy, supple, symmetrical, trachea midline and thyroid normal to inspection and palpation Lungs: clear to auscultation bilaterally Breasts: normal appearance, no masses or tenderness, No nipple retraction or dimpling, No nipple discharge or bleeding, No axillary adenopathy Heart: regular rate and rhythm Abdomen: soft, non-tender; no masses, no organomegaly Extremities: extremities normal, atraumatic, no cyanosis or edema Skin: skin color, texture, turgor normal. No rashes or lesions Lymph nodes: cervical, supraclavicular, and axillary nodes normal. Neurologic: grossly normal  Pelvic: External genitalia:  no lesions              No abnormal inguinal nodes palpated.              Urethra:  normal appearing urethra with no masses, tenderness or lesions              Bartholins and Skenes: normal                 Vagina:  3 cm cystic area at right vagina apex adjacent to cervix.              Cervix: no lesions              Pap taken: no Bimanual Exam:  Uterus:  normal size, contour,  position, consistency, mobility, non-tender              Adnexa: no mass, fullness, tenderness         Chaperone was present for exam:  Kari HERO, CMA  ASSESSMENT: Well woman visit with gynecologic exam. Pelvic cyst.  Probable Gartner's duct cyst. 3 cm.  Stable.  PHQ-2-9: 0 Ehler's Danlos.    PLAN: Mammogram screening at age 38.  Self breast awareness reviewed. Pap and HRV collected:  no.  Due in 2029.  Guidelines for Calcium, Vitamin D , regular exercise program including cardiovascular and weight bearing exercise. Medication refills:  Lo-Estrin 1/20.  3 packs, 3 refills.   Labs with PCP.  Follow up:  yearly and prn.

## 2024-05-04 NOTE — Patient Instructions (Signed)

## 2024-05-10 ENCOUNTER — Other Ambulatory Visit: Payer: Self-pay | Admitting: Obstetrics and Gynecology

## 2024-05-11 NOTE — Telephone Encounter (Signed)
 Loestrin rx was just sent at annual Is this a mistake?

## 2024-05-11 NOTE — Telephone Encounter (Signed)
 Med refill request: BLISOVI FE 1/20 1-20 MG-MCG tablet  Start:  09/19/23 Sig: Take 1 tablet by mouth daily.   Last AEX:  05/04/24 Next AEX: 05/10/2025 Last MMG (if hormonal med):  N/A Refill authorized? Please Advise.

## 2024-05-12 NOTE — Telephone Encounter (Signed)
 Call placed to [facility], spoke with [facility]. Was advised Blisovi Fe refill was sent in by Charmaine Bright, PA.   Blisovi Fe Refill refused.   Call placed to patient. Patient states she is uncertain why she was changed to Loestrin, would prefer to keep Blisovi Fe, patient is aware this is a generic of Loestrin Fe. States she did advise Dr. Nikki she skipped one month of the iron pills due to vacation. Patient states she is going to continue with Blisovi Fe prescribed by PCP. Will wait for Dr. Nikki to return to advise if there was a reason for the change.   Routing to Dr. Nikki to review.

## 2024-05-14 ENCOUNTER — Encounter (HOSPITAL_BASED_OUTPATIENT_CLINIC_OR_DEPARTMENT_OTHER): Payer: Self-pay | Admitting: Physical Therapy

## 2024-05-14 ENCOUNTER — Ambulatory Visit (HOSPITAL_BASED_OUTPATIENT_CLINIC_OR_DEPARTMENT_OTHER): Attending: Family Medicine | Admitting: Physical Therapy

## 2024-05-14 ENCOUNTER — Other Ambulatory Visit: Payer: Self-pay

## 2024-05-14 DIAGNOSIS — M5459 Other low back pain: Secondary | ICD-10-CM | POA: Diagnosis present

## 2024-05-14 DIAGNOSIS — Q796 Ehlers-Danlos syndrome, unspecified: Secondary | ICD-10-CM | POA: Insufficient documentation

## 2024-05-14 DIAGNOSIS — M6281 Muscle weakness (generalized): Secondary | ICD-10-CM | POA: Insufficient documentation

## 2024-05-14 DIAGNOSIS — M545 Low back pain, unspecified: Secondary | ICD-10-CM | POA: Diagnosis not present

## 2024-05-14 DIAGNOSIS — G8929 Other chronic pain: Secondary | ICD-10-CM | POA: Insufficient documentation

## 2024-05-14 DIAGNOSIS — M542 Cervicalgia: Secondary | ICD-10-CM | POA: Diagnosis present

## 2024-05-14 DIAGNOSIS — M546 Pain in thoracic spine: Secondary | ICD-10-CM | POA: Insufficient documentation

## 2024-05-14 NOTE — Therapy (Unsigned)
 OUTPATIENT PHYSICAL THERAPY THORACOLUMBAR EVALUATION   Patient Name: Cynthia [name] MRN: [id number] DOB:[birth year], 38 y.o., female Today's Date: 05/14/2024  END OF SESSION:  PT End of Session - 05/14/24 1459     Visit Number 1    Date for Recertification  07/10/24    Authorization Type [place] med[place]    PT Start Time 1320    PT Stop Time 1400    PT Time Calculation (min) 40 min    Activity Tolerance Patient tolerated treatment well    Behavior During Therapy WFL for tasks assessed/performed          Past Medical History:  Diagnosis Date   ADHD (attention deficit hyperactivity disorder)    Allergic rhinitis    Anxiety    Asthma    Back injury    COMPETITIVE INJURY IN HIGH SCHOOL   Back pain    Depression    Ehlers-Danlos syndrome    Fatty liver    Fibromyalgia    Gallbladder problem    GERD (gastroesophageal reflux disease)    IBS (irritable bowel syndrome)    Insulin  resistance    Joint pain    Kidney stones    Migraines    no aura   OA (osteoarthritis) of knee    Obesity    Pneumonia    PONV (postoperative nausea and vomiting)    Seasonal allergies    Vitamin D  deficiency    Past Surgical History:  Procedure Laterality Date   CHOLECYSTECTOMY     DG DILATION URETERS     KNEE ARTHROSCOPY Left    KNEE ARTHROSCOPY WITH LATERAL MENISECTOMY Right 04/11/2018   Procedure: RIGHT KNEE ARTHROSCOPY WITH PARTIAL MENISECTOMY CHONDROPLASTY;  Surgeon: Gerome Charleston, MD;  Location: [facility];  Service: Orthopedics;  Laterality: Right;   MOUTH SURGERY     SHOULDER SURGERY  2002   RIGHT   WISDOM TOOTH EXTRACTION     WRIST ARTHROSCOPY     Right   Patient Active Problem List   Diagnosis Date Noted   Dysautonomia (HCC) 03/16/2024   EDS (Ehlers-Danlos syndrome) 03/16/2024   Chronic bilateral thoracic back pain 03/16/2024   Chronic bilateral low back pain without sciatica 03/16/2024   Mild intermittent asthma without complication 04/18/2023   Nephrolithiasis 02/13/2023    Acute non-recurrent maxillary sinusitis 09/13/2022   Increased heart rate 10/11/2021   Binge eating disorder 10/11/2021   Impaired fasting glucose 10/11/2021   GAD (generalized anxiety disorder) 10/11/2021   Attention deficit disorder (ADD) without hyperactivity 09/17/2019   Primary osteoarthritis of both knees 08/25/2019   Anxiety 06/04/2019   DDD (degenerative disc disease), cervical 06/03/2018   Degeneration of lumbar intervertebral disc 06/03/2018   Seasonal and perennial allergic rhinitis 05/05/2018   Seasonal allergic conjunctivitis 05/05/2018   Moderate persistent asthma, uncomplicated 05/05/2018   S/P arthroscopy of knee 04/11/2018   Prediabetes 06/11/2017   Vitamin D  deficiency 06/11/2017   Class 3 severe obesity with serious comorbidity and body mass index (BMI) of 45.0 to 49.9 in adult (HCC) 01/09/2012   Asthma     PCP: Charmaine Bright PA-C  REFERRING PROVIDER: Joane Artist RAMAN, MD   REFERRING DIAG:  [phone] (ICD-10-CM) - Chronic bilateral thoracic back pain  M54.50,G89.29 (ICD-10-CM) - Chronic bilateral low back pain without sciatica  Q79.60 (ICD-10-CM) - EDS (Ehlers-Danlos syndrome)  M54.2 (ICD-10-CM) - Neck pain    Rationale for Evaluation and Treatment: Rehabilitation  THERAPY DIAG:  Chronic bilateral thoracic back pain  Other low back pain  Cervical pain (neck)  Muscle weakness (generalized)  ONSET DATE: chronic  SUBJECTIVE:                                                                                                                                                                                           SUBJECTIVE STATEMENT: I saw a surgeon for my back last week but I am not a candidate.  Do not have RA.  Sublux often (shoulders/knees). Recent EDS dx in April. Have had aquatic therapy in past and it helped. MRI showed spine dysfunction in all areas. POTS hasn't limited me. I have an osteopathic adjustment every 4-5 weeks. Have had injections in  spine in past haven't gotten any recently. Always have pain.  Do not have pool access.  Not afraid of water.  PERTINENT HISTORY:  Dysautonomia/POTS typically associated with EDS   Chronic spine pain. Did well in the past with aquatic PT.    Evaluate and Treat. 1-2 times per week for 4-6 weeks. Decrease pain, increase strength, flexibility, function, and range of motion. Modalities may include, traction, ionto, phono, stim, and dry needling prn.  PAIN:  Are you having pain? Yes: NPRS scale: current 5/10; worst 7/10; least 4/10  Pain location: entire back, hips R>L, knees R>L feet and ankles; shoulders Pain description: nagging, stabbing, pinch, numbness Aggravating factors: movement Relieving factors: side lying  PRECAUTIONS: None  RED FLAGS: None   WEIGHT BEARING RESTRICTIONS: No  FALLS:  Has patient fallen in last 6 months? 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  DIAGNOSTIC FINDINGS:  Not in cart.  Pt reports MRI at [facility]  PATIENT SURVEYS:  ODI  COGNITION: Overall cognitive status: Within functional limits for tasks assessed     SENSATION: WFL  MUSCLE LENGTH: hypermobile  POSTURE: rounded shoulders and forward head  PALPATION: TTP About T7, cervical spine, glutes, upper/middle traps, deltoids  LUMBAR ROM:   Full/hypermobile  LOWER EXTREMITY ROM:     WFL  LOWER EXTREMITY MMT:    LE gross 4/5   FUNCTIONAL TESTS:  5 times sit to stand: 20.53 Timed up and go (TUG): 10.39   4 stage balance: passed GAIT: Distance walked: 400 ft Assistive device utilized: None Level of  assistance: Complete Independence Comments: gait slowed, initially antalgic, reduces with distance, increased lateral displacement  TREATMENT  Eval Self care:Posture and body mechanic instruction, STS and sit to and from supine safety with POTS                                                                                                                                PATIENT EDUCATION:  Education details: Discussed eval findings, rehab rationale, aquatic program progression/POC and pools in area. Patient is in agreement  Person educated: Patient Education method: Explanation Education comprehension: verbalized understanding  HOME EXERCISE PROGRAM: TBA  ASSESSMENT:  CLINICAL IMPRESSION: Patient is a 38 y.o. f who was seen today for physical therapy evaluation and treatment for cervical and thoracic back pain.  She also has EDS, POTs and dysautonomia dx.  She presents with pain limited deficits in endurance, activity tolerance, gait, balance, and functional mobility with ADL's.  She has been unable to work due to symptoms but does assist with caring for elderly mother. She has had land and aquatic based therapy this year at different clinic. Reports aquatics helps manage pain. She does not have pool access but is instructed on importance of gaining access if setting to be a long term pain management tool.  We will assist with this as able. Pt will benefit from skilled PT aquatic intervention to improve all areas of deficit and assist in pain management of chronic condition.     OBJECTIVE IMPAIRMENTS: Abnormal gait, decreased activity tolerance, decreased endurance, decreased mobility, difficulty walking, decreased strength, obesity, and pain.   ACTIVITY LIMITATIONS: carrying, lifting, bending, standing, squatting, stairs, transfers, and locomotion level  PARTICIPATION LIMITATIONS: meal prep, cleaning, shopping, community activity, occupation, and yard work  PERSONAL FACTORS: Age and  Time since onset of injury/illness/exacerbation are also affecting patient's functional outcome.   REHAB POTENTIAL: Fair chronicity of dx  CLINICAL DECISION MAKING: Unstable/unpredictable  EVALUATION COMPLEXITY: High   GOALS: Goals reviewed with patient? Yes  SHORT TERM GOALS: Target date: ***  Pt will tolerate full aquatic sessions consistently without increase in pain and with improving function to demonstrate good toleration and effectiveness of intervention.  Baseline: Goal status: INITIAL  2.  Pt will consider gaining pool access for use of the properties of water for chronic conditions maintaining mobility and minimizing pain. Baseline:  Goal status: INITIAL  3.  *** Baseline:  Goal status: INITIAL  4.  *** Baseline:  Goal status: INITIAL  5.  *** Baseline:  Goal status: INITIAL  6.  *** Baseline:  Goal status: INITIAL  LONG TERM GOALS: Target date: 07/10/24  Pt to improve on ODI by 13 % to 57% to demonstrate statistically significant Improvement in function. (MCID 13-15%) Baseline: 35/50=70% Goal status: INITIAL  2.  Pt will report decrease in pain by at least 2 NPRS for improved toleration to activity/quality of life and to demonstrate improved management of pain. Baseline:  Goal status: INITIAL  3.  Pt will improve on 5 X STS test to <or= 16s to demonstrate  improving functional lower extremity strength, transitional movements, and balance. (MDC = 4.2sec)  Baseline: 20.53 Goal status: INITIAL  4.  *** Baseline:  Goal status: INITIAL  5.  *** Baseline:  Goal status: INITIAL  6.  *** Baseline:  Goal status: INITIAL  PLAN:  PT FREQUENCY: {rehab frequency:25116}  PT DURATION: {rehab duration:25117}  PLANNED INTERVENTIONS: 97164- PT Re-evaluation, 97110-Therapeutic exercises, 97530- Therapeutic activity, 97112- Neuromuscular re-education, 97535- Self Care, 02859- Manual therapy, [id number]- Gait training, [id number]- Aquatic Therapy, [id number] (1-2 muscles),  20561 (3+ muscles)- Dry Needling, Patient/Family education, Balance training, Stair training, Taping, Joint mobilization, DME instructions, Cryotherapy, and Moist heat.  PLAN FOR NEXT SESSION: PIERRETTE Shuck [place]) Karyl Sharrar MPT 05/14/24 5:24 PM [facility] [address] [place], [place], [address] Phone: [phone]   Fax:  [phone]

## 2024-05-16 ENCOUNTER — Other Ambulatory Visit: Payer: Self-pay | Admitting: Family Medicine

## 2024-05-18 ENCOUNTER — Other Ambulatory Visit: Payer: Self-pay | Admitting: Obstetrics and Gynecology

## 2024-05-18 MED ORDER — BLISOVI FE 1/20 1-20 MG-MCG PO TABS: 1.0000 | ORAL_TABLET | Freq: Every day | ORAL | 4 refills | Status: AC

## 2024-05-18 NOTE — Telephone Encounter (Signed)
 Rx refill request approved per Dr. Zollie Pee orders.

## 2024-05-18 NOTE — Telephone Encounter (Signed)
 Patient notified. Left detailed message, ok per dpr. Return call to office if any additional questions.   Encounter closed.

## 2024-05-20 ENCOUNTER — Ambulatory Visit: Admitting: Family Medicine

## 2024-05-21 ENCOUNTER — Ambulatory Visit (HOSPITAL_BASED_OUTPATIENT_CLINIC_OR_DEPARTMENT_OTHER): Admitting: Physical Therapy

## 2024-05-27 ENCOUNTER — Encounter (HOSPITAL_BASED_OUTPATIENT_CLINIC_OR_DEPARTMENT_OTHER): Payer: Self-pay | Admitting: Physical Therapy

## 2024-05-27 ENCOUNTER — Other Ambulatory Visit (HOSPITAL_BASED_OUTPATIENT_CLINIC_OR_DEPARTMENT_OTHER): Payer: Self-pay

## 2024-05-27 ENCOUNTER — Ambulatory Visit (HOSPITAL_BASED_OUTPATIENT_CLINIC_OR_DEPARTMENT_OTHER): Admitting: Physical Therapy

## 2024-05-27 NOTE — Progress Notes (Signed)
 Ben Clariza Sickman D.[facility]MENT[facility] [address] [place] [address] Phone: [phone]   Assessment and Plan:     1. Chronic bilateral thoracic back pain (Primary) 2. Chronic bilateral low back pain without sciatica 3. EDS (Ehlers-Danlos syndrome) 4. Neck pain 5. Somatic dysfunction of cervical region 6. Somatic dysfunction of thoracic region 7. Somatic dysfunction of lumbar region 8. Somatic dysfunction of rib region 9. Somatic dysfunction of pelvic region -Chronic with exacerbation, subsequent visit - Recurrent flare of multiple musculoskeletal pains with most prominent being in low back, glutes, hips.  Having temporary relief with OMT, HEP, physical therapy, pain medications - Continue chronic pain medications including muscle relaxers and gabapentin  - Continue HEP and physical therapy - Patient has received relief with OMT in the past.  Elects for repeat OMT today.  Tolerated well per note below. - Decision today to treat with OMT was based on Physical Exam   After verbal consent patient was treated with HVLA (high velocity low amplitude), ME (muscle energy), FPR (flex positional release), ST (soft tissue), PC/PD (Pelvic Compression/ Pelvic Decompression) techniques in cervical, rib, thoracic, lumbar, and pelvic areas. Patient tolerated the procedure well with improvement in symptoms.  Patient educated on potential side effects of soreness and recommended to rest, hydrate, and use Tylenol  as needed for pain control.   Pertinent previous records reviewed include none  Follow Up: 4 to 6 weeks for reevaluation.  Could consider repeat OMT   Subjective:   I, Moenique Parris, am serving as a neurosurgeon for Doctor Morene Mace   Chief Complaint: OMT   HPI:    10/30/2023 Patient is a 38 year old female with EDS. Patient states recent diagnosis EDS. Fall right before christmas off of 6 ft ladder. Hx of back, knee, shoulder injuries. Whole back pain as of late. She  feels crooked at the hips right hip especially. Back ,neck and shoulders are tight. She is in PT and they do dry needle and that has really helped.    11/27/2023 Patient states she is ready for tune up    12/24/2023 Patient states didn't get as much relied as the first visit. She is in flare now    01/21/2024 Patient states she is okay. Back is flared she is only comfortable laying on her side     02/12/2024 Patient states she is okay about the same    03/11/2024 Patient states not feeling great today. Back is getting worse and worse. [facility]es to [facility] and is focusing on one area at a time just the thoracic spine. Quality of life is not great right now. Hip back neck shoulders are painful. When she turns her head to the left the ROM is not the same as the right. Patient states that she can not sustain the adjustments from [facility]. Currently has pain in jaw as well.    03/31/2024 Patient states she is the same. Feels good for a day or two then everything is out of whack   06/02/2024 Patient states she is okay, the same    Relevant Historical Information: Hypermobility, morbid obesity,  Additional pertinent review of systems negative.  Current Outpatient Medications  Medication Sig Dispense Refill   albuterol  (VENTOLIN  HFA) 108 (90 Base) MCG/ACT inhaler Inhale 2 puffs into the lungs every 4 (four) hours as needed for wheezing or shortness of breath. 1 each 3   ALLERGY  RELIEF 180 MG tablet TAKE 1 TABLET(180 MG) BY MOUTH DAILY 90 tablet 1  BLISOVI FE 1/20 1-20 MG-MCG tablet Take 1 tablet by mouth daily. 84 tablet 4   COVID-19 mRNA bivalent vaccine, Pfizer, (PFIZER COVID-19 VAC BIVALENT) injection Inject into the muscle. 0.3 mL 0   cyanocobalamin  (VITAMIN B12) 1000 MCG tablet Take by mouth.     cyclobenzaprine (FLEXERIL) 10 MG tablet TAKE 1 TABLET BY MOUTH THREE TIMES DAILY FOR 15 DAYS AS NEEDED FOR MUSCLE SPASMS     EPINEPHrine  0.3 mg/0.3 mL IJ SOAJ injection Inject 0.3 mg into the muscle as  needed for anaphylaxis. 1 each 2   famotidine  (PEPCID ) 20 MG tablet TAKE 1 TABLET(20 MG) BY MOUTH TWICE DAILY 180 tablet 0   FLUoxetine  (PROZAC ) 40 MG capsule Take 1 capsule (40 mg total) by mouth daily. 90 capsule 0   gabapentin  (NEURONTIN ) 300 MG capsule TAKE 1 CAPSULE BY MOUTH EVERY MORNING, 1 CAPSULE IN THE EVENING, AND 2 CAPSULES AT BEDTIME//Please call and make overdue appt for further refills 120 capsule 0   glycopyrrolate  (ROBINUL ) 1 MG tablet Take 1 tablet (1 mg total) by mouth in the morning, at noon, in the evening, and at bedtime. 120 tablet 4   HYDROcodone -acetaminophen  (NORCO/VICODIN) 5-325 MG tablet Take 1 tablet by mouth every 6 (six) hours as needed.     levocetirizine (XYZAL ) 5 MG tablet TAKE 1 TABLET(5 MG) BY MOUTH EVERY EVENING 90 tablet 1   Lisdexamfetamine Dimesylate  (VYVANSE ) 40 MG CHEW Chew 40 mg by mouth daily at 12 noon. 30 tablet 0   methocarbamol  (ROBAXIN ) 500 MG tablet Take 1 tablet (500 mg total) by mouth 2 (two) times daily as needed for muscle spasms. 60 tablet 0   montelukast  (SINGULAIR ) 10 MG tablet TAKE 1 TABLET(10 MG) BY MOUTH AT BEDTIME. NEED APPOINTMENT FOR ADDITIONAL REFILLS. 90 tablet 1   omeprazole (PRILOSEC) 20 MG capsule 1 capsule 30 minutes before morning meal     ondansetron  (ZOFRAN  ODT) 4 MG disintegrating tablet Take 1 tablet (4 mg total) by mouth every 8 (eight) hours as needed for nausea or vomiting. 20 tablet 0   predniSONE  (STERAPRED UNI-PAK 48 TAB) 5 MG (48) TBPK tablet 12 day dosepack po 48 tablet 0   RAGWITEK 12 AMB A 1-U SUBL DISSOLVE 1 TABLET UNDER THE TONGUE DAILY. BRING THIS PERSON TO YOUR APPOINTMENT AND YOU WILL TAKE THE 1ST TABLET IN THE CLINIC 30 tablet 5   Semaglutide -Weight Management 1 MG/0.5ML SOAJ Inject 1 mg into the skin once a week. 6 mL 0   SYRINGE-NEEDLE, DISP, 3 ML (EASYPOINT NEEDLE/SYRINGE) 25G X 5/8 3 ML MISC Use one needle/syringe to inject Vitamin B12 into the muscle once every 7 days. *Disp. whatever insurance will cover*      tirzepatide  (ZEPBOUND ) 10 MG/0.5ML Pen Inject 10 mg into the skin once a week. 2 mL 0   tirzepatide  (ZEPBOUND ) 10 MG/0.5ML Pen Inject 10 mg into the skin every 7 (seven) days. 6 mL 0   tirzepatide  (ZEPBOUND ) 12.5 MG/0.5ML Pen Inject 12.5 mg into the skin every 7 (seven) days. 2 mL 0   tirzepatide  (ZEPBOUND ) 12.5 MG/0.5ML Pen Inject 12.5 mg into the skin once a week. 6 mL 1   tirzepatide  (ZEPBOUND ) 15 MG/0.5ML Pen Inject 15 mg into the skin once a week. 2 mL 0   tirzepatide  (ZEPBOUND ) 15 MG/0.5ML Pen Inject 15 mg into the skin once a week. 6 mL 0   tirzepatide  (ZEPBOUND ) 5 MG/0.5ML injection vial Inject 5 mg into the skin once a week. 6 mL 0   tirzepatide  (ZEPBOUND ) 5 MG/0.5ML  Pen Inject 5 mg into the skin once a week. 6 mL 0   tirzepatide  (ZEPBOUND ) 7.5 MG/0.5ML Pen Inject 7.5 mg into the skin once a week. 6 mL 0   traMADol (ULTRAM) 50 MG tablet TAKE 1 TABLET BY MOUTH THREE TIMES DAILY FOR 5 DAYS AS NEEDED     traZODone (DESYREL) 100 MG tablet Take 100 mg by mouth at bedtime.     Vitamin D , Ergocalciferol , (DRISDOL ) 1.25 MG (50000 UNIT) CAPS capsule Take 1 capsule (50,000 Units total) by mouth every 7 (seven) days. 4 capsule 0   XIIDRA 5 % SOLN      No current facility-administered medications for this visit.      Objective:     Vitals:   06/02/24 1439  Weight: 245 lb (111.1 kg)  Height: 5' 3 (1.6 m)      Body mass index is 43.4 kg/m.    Physical Exam:     General: Well-appearing, cooperative, sitting comfortably in no acute distress.   OMT Physical Exam:  ASIS Compression Test: Positive Right Cervical: TTP paraspinal, C3-5 RRSR Rib: Bilateral elevated first rib with TTP Thoracic: TTP paraspinal, T5-7 RRSL Lumbar: TTP paraspinal, L1-3 RRSL Pelvis: Right anterior innominate  Electronically signed by:  Odis Mace D.[facility] 3:07 PM 06/02/24

## 2024-05-31 NOTE — Patient Instructions (Signed)
   [address] [place] [address] Dept: [phone]   Diagnostics: NAC 80 patches placed NAC-80 (1-80)   1. Ammonium persulfate  2. [place] Balsam  3. Omitted  4. 4-tert-Butylphenolformaldehyde resin (PTBP)  5. Bacitracin  6. Budesonide  7. Quaternium-15  8. Cinnamal  9. Cobalt(II) chloride hexahydrate  10. Colophonium  11. Methyldibromo glutaronitrile  12. Decyl Glucoside  13. Ethylenediamine dihydrochloride  14. 2-Hydroxyethyl methacrylate  15. Hydroperoxides of Linalool  16. Iodopropynyl butylcarbamate  17. 2-Mercaptobenzothiazole (MBT)  18. Thiuram mix  19. METHYLISOTHIAZOLINONE  20. Propylene glycol  21. 1,3-Diphenylguanidine  22. Hydroperoxides of Limonene  23. Black rubber mix  24. Carba mix  25. Fragrance mix I  26. Fragrance mix II  27. Textile dye mix II  28. Neomycin sulfate  29. Nickel(II) sulfate hexahydrate  30. p-Phenylenediamine (PPD)  31. Potassium dichromate  32. Propolis  33. Sodium Metabisulfite  34. Tixocortol-21-pivalate  35. Lanolin alcohol  36. Methylisothiazolinone + Methylchloroisothiazolinone  37. Cocamidopropyl betaine  38. 3-(Dimethylamino)-1-propylamine  39. Formaldehyde  40. Oleamidopropyl dimethylamine  41. 2-Bromo-2-Nitropropane-l,3-diol  42. Diazolidinyl urea  43. DMDM Hydantoin  44. Epoxy resin, Bisphenol A  45. Benzophenone-4  46. Imidazolidinyl urea  47. Lauryl polyglucose  48 Methyl methacrylate  49. Paraben mix  50. Mercapto mix  51. Caine mix III  52. Mixed dialkyl thiourea  53. Compositae mix II  54. Toluenesulfonamide formaldehyde resin  55. Tea Tree Oil oxidized  56. Ylang-Ylang oil  57. Amidoamine  58. Amerchol L 101  59. Benzocaine  60. Benzyl alchohol  61. Benzyl salicylate  62. Chloroxylenol (PCMX)  63. Cocamide DEA  64. Clobetasol-17-propionate  65. Toluene-2,5-Diamine sulfate  66. Ethyl acrylate  67. N-Isopropyl-N-phenyl--4-phenylenediamine (IPPD)  68. Lidocaine   69. Omitted  70.  Sesquiterpene lactone mix  71. 2-n-Octyl-4-isothiazolin-3-one  72. Propyl gallate  73. Polymyxin B sulfate  74. Pramoxine hydrochloride  75. Sodium benzoate  76. Sorbitan oleate  77. Sorbitan sesquioleate  78. Tocopherol  79. BENZALKONIUM CHLORIDE  80. Chlorhexidine  digluconate    Allergic contact dermatitis - Instructions provided on care of the patches for the next 48 hours. Vallie Fayette was instructed to avoid showering for the next 48 hours. Deloyce Walthers will follow up in 48 hours and 96 hours for patch readings.    Call the clinic if this treatment plan is not working well for you  Follow up in 2 days or sooner if needed.

## 2024-05-31 NOTE — Progress Notes (Unsigned)
   [address] [place] [address] Dept: [phone]  Follow-up Note  RE: Cynthia [name] MRN: [id number] DOB: 05/16/1986 Date of Office Visit: 06/01/2024  Primary care provider: Katina Pfeiffer, PA-C Referring provider: Katina Pfeiffer, PA-C   Cynthia [name] returns to the office today for the patch test placement, given suspected history of contact dermatitis. Discussed care of patches specifically the need to keep patches dry. All questions answered.    Diagnostics: NAC 80 patches placed NAC-80 (1-80)   1. Ammonium persulfate  2. [place] Balsam  3. Omitted  4. 4-tert-Butylphenolformaldehyde resin (PTBP)  5. Bacitracin  6. Budesonide  7. Quaternium-15  8. Cinnamal  9. Cobalt(II) chloride hexahydrate  10. Colophonium  11. Methyldibromo glutaronitrile  12. Decyl Glucoside  13. Ethylenediamine dihydrochloride  14. 2-Hydroxyethyl methacrylate  15. Hydroperoxides of Linalool  16. Iodopropynyl butylcarbamate  17. 2-Mercaptobenzothiazole (MBT)  18. Thiuram mix  19. METHYLISOTHIAZOLINONE  20. Propylene glycol  21. 1,3-Diphenylguanidine  22. Hydroperoxides of Limonene  23. Black rubber mix  24. Carba mix  25. Fragrance mix I  26. Fragrance mix II  27. Textile dye mix II  28. Neomycin sulfate  29. Nickel(II) sulfate hexahydrate  30. p-Phenylenediamine (PPD)  31. Potassium dichromate  32. Propolis  33. Sodium Metabisulfite  34. Tixocortol-21-pivalate  35. Lanolin alcohol  36. Methylisothiazolinone + Methylchloroisothiazolinone  37. Cocamidopropyl betaine  38. 3-(Dimethylamino)-1-propylamine  39. Formaldehyde  40. Oleamidopropyl dimethylamine  41. 2-Bromo-2-Nitropropane-l,3-diol  42. Diazolidinyl urea  43. DMDM Hydantoin  44. Epoxy resin, Bisphenol A  45. Benzophenone-4  46. Imidazolidinyl urea  47. Lauryl polyglucose  48 Methyl methacrylate  49. Paraben mix  50. Mercapto mix  51. Caine mix III  52. Mixed dialkyl thiourea  53. Compositae mix II  54.  Toluenesulfonamide formaldehyde resin  55. Tea Tree Oil oxidized  56. Ylang-Ylang oil  57. Amidoamine  58. Amerchol L 101  59. Benzocaine  60. Benzyl alchohol  61. Benzyl salicylate  62. Chloroxylenol (PCMX)  63. Cocamide DEA  64. Clobetasol-17-propionate  65. Toluene-2,5-Diamine sulfate  66. Ethyl acrylate  67. N-Isopropyl-N-phenyl--4-phenylenediamine (IPPD)  68. Lidocaine   69. Omitted  70. Sesquiterpene lactone mix  71. 2-n-Octyl-4-isothiazolin-3-one  72. Propyl gallate  73. Polymyxin B sulfate  74. Pramoxine hydrochloride  75. Sodium benzoate  76. Sorbitan oleate  77. Sorbitan sesquioleate  78. Tocopherol  79. BENZALKONIUM CHLORIDE  80. Chlorhexidine  digluconate    Allergic contact dermatitis - Instructions provided on care of the patches for the next 48 hours. Cynthia [name] was instructed to avoid showering for the next 48 hours. Cynthia [name] will follow up in 48 hours and 96 hours for patch readings.    Call the clinic if this treatment plan is not working well for you  Follow up in 2 days or sooner if needed.  Thank you for the opportunity to care for this patient.  Please do not hesitate to contact me with questions.  Arlean Mutter, FNP [facility]  [facility]

## 2024-06-01 ENCOUNTER — Ambulatory Visit (INDEPENDENT_AMBULATORY_CARE_PROVIDER_SITE_OTHER): Admitting: Family Medicine

## 2024-06-01 ENCOUNTER — Encounter: Payer: Self-pay | Admitting: Family Medicine

## 2024-06-01 ENCOUNTER — Other Ambulatory Visit (HOSPITAL_BASED_OUTPATIENT_CLINIC_OR_DEPARTMENT_OTHER): Payer: Self-pay

## 2024-06-01 DIAGNOSIS — L235 Allergic contact dermatitis due to other chemical products: Secondary | ICD-10-CM

## 2024-06-01 DIAGNOSIS — L259 Unspecified contact dermatitis, unspecified cause: Secondary | ICD-10-CM | POA: Insufficient documentation

## 2024-06-02 ENCOUNTER — Ambulatory Visit: Admitting: Allergy & Immunology

## 2024-06-02 ENCOUNTER — Ambulatory Visit (INDEPENDENT_AMBULATORY_CARE_PROVIDER_SITE_OTHER): Admitting: Sports Medicine

## 2024-06-02 VITALS — Ht 63.0 in | Wt 245.0 lb

## 2024-06-02 DIAGNOSIS — M9905 Segmental and somatic dysfunction of pelvic region: Secondary | ICD-10-CM

## 2024-06-02 DIAGNOSIS — G8929 Other chronic pain: Secondary | ICD-10-CM

## 2024-06-02 DIAGNOSIS — Q796 Ehlers-Danlos syndrome, unspecified: Secondary | ICD-10-CM

## 2024-06-02 DIAGNOSIS — M546 Pain in thoracic spine: Secondary | ICD-10-CM | POA: Diagnosis not present

## 2024-06-02 DIAGNOSIS — M9908 Segmental and somatic dysfunction of rib cage: Secondary | ICD-10-CM | POA: Diagnosis not present

## 2024-06-02 DIAGNOSIS — M9903 Segmental and somatic dysfunction of lumbar region: Secondary | ICD-10-CM

## 2024-06-02 DIAGNOSIS — M542 Cervicalgia: Secondary | ICD-10-CM | POA: Diagnosis not present

## 2024-06-02 DIAGNOSIS — M9902 Segmental and somatic dysfunction of thoracic region: Secondary | ICD-10-CM | POA: Diagnosis not present

## 2024-06-02 DIAGNOSIS — M545 Low back pain, unspecified: Secondary | ICD-10-CM

## 2024-06-02 DIAGNOSIS — M9901 Segmental and somatic dysfunction of cervical region: Secondary | ICD-10-CM | POA: Diagnosis not present

## 2024-06-02 NOTE — Progress Notes (Unsigned)
   Follow Up Note  RE: Cynthia [name] MRN: [id number] DOB: [birth year] Date of Office Visit: 06/03/2024  Referring provider: Katina Pfeiffer, PA-C Primary care provider: Katina Pfeiffer, PA-C  History of Present Illness: I had the pleasure of seeing Cynthia [name] for a follow up visit at the [facility] on 06/03/2024. She is a 38 y.o. female, who is being followed for dermatitis. Today she is here for initial patch test interpretation, given suspected history of contact dermatitis.   Diagnostics:  NAC-80 Panel 48 hour reading:     Assessment and Plan: Cynthia [name] is a 38 y.o. female with: There are no diagnoses linked to this encounter. Patches removed and 48 hour reading was ***. The patient has been provided detailed information regarding the substances she is sensitive to, as well as products containing the substances.  Meticulous avoidance of these substances is recommended.   No follow-ups on file.  It was my pleasure to see Cynthia [name] today and participate in her care. Please feel free to contact me with any questions or concerns.  Sincerely,  Orlan Cramp, DO Allergy  & Immunology  [facility]  [place] office: [phone] [facility] office: [phone]

## 2024-06-03 ENCOUNTER — Ambulatory Visit (INDEPENDENT_AMBULATORY_CARE_PROVIDER_SITE_OTHER): Admitting: Allergy

## 2024-06-03 ENCOUNTER — Encounter: Payer: Self-pay | Admitting: Allergy

## 2024-06-03 DIAGNOSIS — L2389 Allergic contact dermatitis due to other agents: Secondary | ICD-10-CM

## 2024-06-05 ENCOUNTER — Ambulatory Visit (HOSPITAL_BASED_OUTPATIENT_CLINIC_OR_DEPARTMENT_OTHER): Admitting: Physical Therapy

## 2024-06-05 ENCOUNTER — Ambulatory Visit (INDEPENDENT_AMBULATORY_CARE_PROVIDER_SITE_OTHER): Admitting: Internal Medicine

## 2024-06-05 DIAGNOSIS — L2389 Allergic contact dermatitis due to other agents: Secondary | ICD-10-CM

## 2024-06-05 NOTE — Progress Notes (Signed)
 Follow Up Note  RE: ALLEYA [name] MRN: [id number] DOB: 1985/10/29 Date of Office Visit: 06/05/2024  Referring provider: Katina Pfeiffer, PA-C Primary care provider: Katina Pfeiffer, PA-C  History of Present Illness: I had the pleasure of seeing Cynthia [name] for a follow up visit at the [facility] on 06/05/2024. She is a 38 y.o. female, who is being followed for contact dermatitis . Today she is here for final patch test interpretation, given suspected history of contact dermatitis.   Diagnostics:  NAC 80 96 hour reading:   NAC-80 - 06/05/24 1100     NAC Reading Interval Day 3    NAC Panel Tested NAC-80 (1-80)    1. Ammonium persulfate Negative    2. [place] Balsam 1+    3. BENZISOTHIAZOLINONE --   omit   4. 4-tert-Butylphenolformaldehyde resin (PTBP) Negative    5. Bacitracin Negative    6. Budesonide Negative    7. Quaternium-15 Negative    8. Cinnamal Negative    9. Cobalt(II) chloride hexahydrate Negative    10. Colophonium 1+    11. Methyldibromo glutaronitrile Negative    12. Decyl Glucoside Negative    13. Ethylenediamine dihydrochloride Negative    14. 2-Hydroxyethyl methacrylate Negative    15. Hydroperoxides of Linalool 1+    16. Iodopropynyl butylcarbamate Negative    17. 2-Mercaptobenzothiazole (MBT) Negative    18. Thiuram mix Negative    19. METHYLISOTHIAZOLINONE Negative    20. Propylene glycol Negative    21. 1,3-Diphenylguanidine Negative    22. Hydroperoxides of Limonene Negative    23. Black rubber mix Negative    24. Carba mix Negative    25. Fragrance mix I 1+    26. Fragrance mix II Negative    27. Textile dye mix II 1+    28. Neomycin sulfate Negative    29. Nickel(II) sulfate hexahydrate 3+    30. p-Phenylenediamine (PPD) Negative    31. Potassium dichromate Negative    32. Propolis +/-    33. Sodium Metabisulfite Negative    34. Tixocortol-21-pivalate Negative    35. Lanolin alcohol Negative    36.  Methylisothiazolinone + Methylchloroisothiazolinone Negative    37. Cocamidopropyl betaine Negative    38. 3-(Dimethylamino)-1-propylamine Negative    39. Formaldehyde Negative    40. Oleamidopropyl dimethylamine Negative    41. 2-Bromo-2-Nitropropane-l,3-diol Negative    42. Diazolidinyl urea Negative    43. DMDM Hydantoin Negative    44. Epoxy resin, Bisphenol A Negative    45. Benzophenone-4 Negative    46. Imidazolidinyl urea Negative    47. Lauryl polyglucose Negative    48 Methyl methacrylate Negative    49. Paraben mix Negative    50. Mercapto mix Negative    51. Caine mix III Negative    52. Mixed dialkyl thiourea Negative    53. Compositae mix II Negative    54. Toluenesulfonamide formaldehyde resin Negative    55. Tea Tree Oil oxidized Negative    56. Ylang-Ylang oil Negative    57. Amidoamine Negative    58. Amerchol L 101 Negative    59. Benzocaine Negative    60. Benzyl alchohol Negative    61. Benzyl salicylate Negative    62. Chloroxylenol (PCMX) Negative    63. Cocamide DEA Negative    64. Clobetasol-17-propionate Negative    65. Toluene-2,5-Diamine sulfate Negative    66. Ethyl acrylate Negative    67. N-Isopropyl-N-phenyl--4-phenylenediamine (IPPD) Negative    68. Lidocaine  Negative  69. Hydroxyisohexyl 3-Cyclohexene Carboxaldehyde --   omit   70. Sesquiterpene lactone mix Negative    71. 2-n-Octyl-4-isothiazolin-3-one Negative    72. Propyl gallate Negative    73. Polymyxin B sulfate Negative    74. Pramoxine hydrochloride Negative    75. Sodium benzoate Negative    76. Sorbitan oleate Negative    77. Sorbitan sesquioleate Negative    78. Tocopherol Negative    79. BENZALKONIUM CHLORIDE Negative    80. Chlorhexidine  digluconate Negative           Assessment and Plan: [name] is a 38 y.o. female with: Concern for Contact Dermatitis:  The patient has been provided detailed information regarding the substances she is sensitive to, as well as  products containing the substances.  Meticulous avoidance of these substances is recommended. If avoidance is not possible, the use of barrier creams or lotions is recommended. If symptoms persist or progress despite meticulous avoidance of chemicals/substances above, dermatology evaluation may be warranted. No follow-ups on file.  It was my pleasure to see Cynthia [name] today and participate in her care. Please feel free to contact me with any questions or concerns.  Sincerely,   Hargis Springer, MD [facility]

## 2024-06-09 ENCOUNTER — Ambulatory Visit (INDEPENDENT_AMBULATORY_CARE_PROVIDER_SITE_OTHER): Admitting: Allergy & Immunology

## 2024-06-09 VITALS — BP 126/72 | HR 107 | Ht 62.0 in | Wt 241.0 lb

## 2024-06-09 DIAGNOSIS — J4520 Mild intermittent asthma, uncomplicated: Secondary | ICD-10-CM

## 2024-06-09 DIAGNOSIS — R232 Flushing: Secondary | ICD-10-CM

## 2024-06-09 DIAGNOSIS — L235 Allergic contact dermatitis due to other chemical products: Secondary | ICD-10-CM

## 2024-06-09 DIAGNOSIS — J3089 Other allergic rhinitis: Secondary | ICD-10-CM

## 2024-06-09 DIAGNOSIS — J302 Other seasonal allergic rhinitis: Secondary | ICD-10-CM

## 2024-06-09 NOTE — Progress Notes (Unsigned)
 FOLLOW UP  Date of Service/Encounter:  06/09/24   Assessment:   Mast cell activation   Mild intermittent asthma without complication    Seasonal and perennial allergic rhinitis (grasses, weeds, trees, ragweed, indoor/outdoor molds)   Rash and contact dermatitis   Plan/Recommendations:   Patient Instructions  Asthma - Lung testing looks great today. - Continue montelukast  10 mg once a day to prevent cough or wheeze - Continue albuterol  2 puffs once every 4 hours as needed for cough or wheeze - Continue albuterol  2 puffs 5 to 15 minutes before activity to decrease cough or wheeze  Seasonal and perennial allergic rhinitis (grasses, weeds, trees, ragweed, indoor/outdoor molds)  -Continue Ragwitek 1 sublingual tablet once a day. - Continue Xyzal  5 mg once daily.  - Continue with Singulair  10mg  daily.  - Continue Qnasl  2 sprays in each nostril once a day as needed for a stuffy nose - Continue with Pataday  one drop in each eye once a day as needed for red, itchy eyes OR Zaditor one drop in each eye twice a day as needed for red itchy eyes. - We can REPEAT SKIN TESTING before the end of the year to check on environmental allergens and food allergies.  POTS - Continue with the current workup and management.   Rash with contact dermatitis  - Continue to avoid balsam of [place], cocoa phoneme, hydroperoxide of linalool, fragrance mix 1, textile dye 2, nickel, and propilis. - Flushing today (getting tryptase today). - Check your spam folder for the Safe List, but call us  if you do not find it.   Concern for MCAS - Testing was all negative for MCAS. - Tryptase sent today since you were having a reaction. - We will let you know when this comes back. - Standing tryptase ordered and paper provided in case you  need to get this done again.  - Continue with the alternating antihistamines like you are doing. - Continue with the Pepcid  (famotidine ) 20mg  twice daily. - Continue with the  Singulair  (montelukast ) 10mg  daily.  - We will send in Neffy  instead of the EpiPen  - We could start Xolair for long term management to stabilize your mast cells.   Return in about 2 weeks (around 06/23/2024) for SKIN TESTING (1-55 + [employer]). You can have the follow up appointment with Dr. Iva or a Nurse Practicioner (our Nurse Practitioners are excellent and always have Physician oversight!).    Please inform us  of any Emergency Department visits, hospitalizations, or changes in symptoms. Call us  before going to the ED for breathing or allergy  symptoms since we might be able to fit you in for a sick visit. Feel free to contact us  anytime with any questions, problems, or concerns.  It was a pleasure to see you again today!  Websites that have reliable patient information: 1. American Academy of Asthma, Allergy , and Immunology: www.aaaai.org 2. Food Allergy  Research and Education (FARE): foodallergy.org 3. Mothers of Asthmatics: http://www.asthmacommunitynetwork.org 4. American College of Allergy , Asthma, and Immunology: www.acaai.org      "Like" us  on Facebook and Instagram for our latest updates!      A healthy democracy works best when [employer] participate! Make sure you are registered to vote! If you have moved or changed any of your contact information, you will need to get this updated before voting! Scan the QR codes below to learn more!       We will continue with treatment of symptoms with a mix of antihistamines and other  medicines that help reduce mast cell activity. If symptoms are severe, we may think about adding Xolair (omalizumab), which has been shown to lower the chance of mast cell-related problems, including serious allergic reactions. Also, if you know certain things trigger your symptoms, it is best to avoid those triggers when possible.          Subjective:   Cynthia [name] is a 38 y.o. female presenting today for follow up of  Chief  Complaint  Patient presents with   Follow-up    4 weeks    Cynthia [name] has a history of the following: Patient Active Problem List   Diagnosis Date Noted   Dermatitis venenata 06/01/2024   Dysautonomia (HCC) 03/16/2024   EDS (Ehlers-Danlos syndrome) 03/16/2024   Chronic bilateral thoracic back pain 03/16/2024   Chronic bilateral low back pain without sciatica 03/16/2024   Mild intermittent asthma without complication 04/18/2023   Nephrolithiasis 02/13/2023   Acute non-recurrent maxillary sinusitis 09/13/2022   Increased heart rate 10/11/2021   Binge eating disorder 10/11/2021   Impaired fasting glucose 10/11/2021   GAD (generalized anxiety disorder) 10/11/2021   Attention deficit disorder (ADD) without hyperactivity 09/17/2019   Primary osteoarthritis of both knees 08/25/2019   Anxiety 06/04/2019   DDD (degenerative disc disease), cervical 06/03/2018   Degeneration of lumbar intervertebral disc 06/03/2018   Seasonal and perennial allergic rhinitis 05/05/2018   Seasonal allergic conjunctivitis 05/05/2018   Moderate persistent asthma, uncomplicated 05/05/2018   S/P arthroscopy of knee 04/11/2018   Prediabetes 06/11/2017   Vitamin D  deficiency 06/11/2017   Class 3 severe obesity with serious comorbidity and body mass index (BMI) of 45.0 to 49.9 in adult [facility]) 01/09/2012   Asthma     History obtained from: chart review and {Persons; PED relatives w/patient:19415::patient}.  Discussed the use of AI scribe software for clinical note transcription with the patient and/or guardian, who gave verbal consent to proceed.  Cynthia [name] is a 38 y.o. female presenting for {Blank single:19197::a food challenge,a drug challenge,skin testing,a sick visit,an evaluation of ***,a follow up visit}.  I last saw her in July 2025.  At that time, lung testing looked great.  We continue with montelukast  as well as albuterol .  For her allergic rhinitis, she was on the ragweed tablet once a day  as well as Xyzal , Singulair , and Qnasl .  She also was on Pataday .  For her rash, we decided to do neck AT testing.  We obtained labs to look for mast cell activation syndrome and recommended Xyzal  5 mg daily and singular 10 mg daily.  All of the labs we did were completely negative.  Asthma/Respiratory Symptom History: ***  Allergic Rhinitis Symptom History: ***  Food Allergy  Symptom History: ***  Skin Symptom History: Contact dermatitis testing was positive to balsam of [place], cocoa phoneme, hydroperoxide of linalool, fragrance mix 1, textile dye 2, nickel, and propilis.  GERD Symptom History: ***  Infection Symptom History: ***  Otherwise, there have been no changes to her past medical history, surgical history, family history, or social history.    Review of systems otherwise negative other than that mentioned in the HPI.    Objective:   Blood pressure 126/72, pulse (!) 107, height 5' 2 (1.575 m), weight 241 lb (109.3 kg), SpO2 100%. Body mass index is 44.08 kg/m.    Physical Exam   Diagnostic studies:    Spirometry: results normal (FEV1: 2.76/97%, FVC: 3.21/93%, FEV1/FVC: 86%).    Spirometry consistent with normal pattern. {Blank single:19197::Albuterol /Atrovent nebulizer,Xopenex/Atrovent  nebulizer,Albuterol  nebulizer,Albuterol  four puffs via MDI,Xopenex four puffs via MDI} treatment given in clinic with {Blank single:19197::significant improvement in FEV1 per ATS criteria,significant improvement in FVC per ATS criteria,significant improvement in FEV1 and FVC per ATS criteria,improvement in FEV1, but not significant per ATS criteria,improvement in FVC, but not significant per ATS criteria,improvement in FEV1 and FVC, but not significant per ATS criteria,no improvement}.  Allergy  Studies: {Blank single:19197::none,deferred due to recent antihistamine use,deferred due to insurance stipulations that require a separate visit for testing,labs sent  instead, }    {Blank single:19197::Allergy  testing results were read and interpreted by myself, documented by clinical staff., }      Marty Shaggy, MD  [facility] [place] 

## 2024-06-09 NOTE — Patient Instructions (Addendum)
 Asthma - Lung testing looks great today. - Continue montelukast  10 mg once a day to prevent cough or wheeze - Continue albuterol  2 puffs once every 4 hours as needed for cough or wheeze - Continue albuterol  2 puffs 5 to 15 minutes before activity to decrease cough or wheeze  Seasonal and perennial allergic rhinitis (grasses, weeds, trees, ragweed, indoor/outdoor molds)  -Continue Ragwitek 1 sublingual tablet once a day. - Continue Xyzal  5 mg once daily.  - Continue with Singulair  10mg  daily.  - Continue Qnasl  2 sprays in each nostril once a day as needed for a stuffy nose - Continue with Pataday  one drop in each eye once a day as needed for red, itchy eyes OR Zaditor one drop in each eye twice a day as needed for red itchy eyes. - We can REPEAT SKIN TESTING before the end of the year to check on environmental allergens and food allergies.  POTS - Continue with the current workup and management.   Rash with contact dermatitis  - Continue to avoid balsam of [place], cocoa phoneme, hydroperoxide of linalool, fragrance mix 1, textile dye 2, nickel, and propilis. - Flushing today (getting tryptase today). - Check your spam folder for the Safe List, but call us  if you do not find it.   Concern for MCAS - Testing was all negative for MCAS. - Tryptase sent today since you were having a reaction. - We will let you know when this comes back. - Standing tryptase ordered and paper provided in case you  need to get this done again.  - Continue with the alternating antihistamines like you are doing. - Continue with the Pepcid  (famotidine ) 20mg  twice daily. - Continue with the Singulair  (montelukast ) 10mg  daily.  - We will send in Neffy  instead of the EpiPen  - We could start Xolair for long term management to stabilize your mast cells.   Return in about 2 weeks (around 06/23/2024) for SKIN TESTING (1-55 + [employer] FO[employer]). You can have the follow up appointment with Dr. Iva or a Nurse Practicioner  (our Nurse Practitioners are excellent and always have Physician oversight!).    Please inform us  of any Emergency Department visits, hospitalizations, or changes in symptoms. Call us  before going to the ED for breathing or allergy  symptoms since we might be able to fit you in for a sick visit. Feel free to contact us  anytime with any questions, problems, or concerns.  It was a pleasure to see you again today!  Websites that have reliable patient information: 1. American Academy of Asthma, Allergy , and Immunology: www.aaaai.org 2. Food Allergy  Research and Education (FARE): foodallergy.org 3. Mothers of Asthmatics: http://www.asthmacommunitynetwork.org 4. [employer] of Allergy , Asthma, and Immunology: www.acaai.org      "Like" us  on Facebook and Instagram for our latest updates!      A healthy democracy works best when [employer] participate! Make sure you are registered to vote! If you have moved or changed any of your contact information, you will need to get this updated before voting! Scan the QR codes below to learn more!       We will continue with treatment of symptoms with a mix of antihistamines and other medicines that help reduce mast cell activity. If symptoms are severe, we may think about adding Xolair (omalizumab), which has been shown to lower the chance of mast cell-related problems, including serious allergic reactions. Also, if you know certain things trigger your symptoms, it is best to avoid those triggers when possible.

## 2024-06-10 ENCOUNTER — Other Ambulatory Visit: Payer: Self-pay | Admitting: Family Medicine

## 2024-06-10 ENCOUNTER — Ambulatory Visit: Admitting: Family Medicine

## 2024-06-11 ENCOUNTER — Encounter: Payer: Self-pay | Admitting: Family Medicine

## 2024-06-11 ENCOUNTER — Other Ambulatory Visit (HOSPITAL_BASED_OUTPATIENT_CLINIC_OR_DEPARTMENT_OTHER): Payer: Self-pay

## 2024-06-11 ENCOUNTER — Ambulatory Visit: Admitting: Family Medicine

## 2024-06-11 ENCOUNTER — Encounter: Payer: Self-pay | Admitting: Allergy & Immunology

## 2024-06-11 VITALS — BP 116/74 | HR 97 | Temp 97.2°F | Ht 62.0 in | Wt 239.0 lb

## 2024-06-11 DIAGNOSIS — Z23 Encounter for immunization: Secondary | ICD-10-CM | POA: Diagnosis not present

## 2024-06-11 DIAGNOSIS — E66813 Obesity, class 3: Secondary | ICD-10-CM

## 2024-06-11 DIAGNOSIS — Q796 Ehlers-Danlos syndrome, unspecified: Secondary | ICD-10-CM | POA: Diagnosis not present

## 2024-06-11 DIAGNOSIS — F50819 Binge eating disorder, unspecified: Secondary | ICD-10-CM

## 2024-06-11 DIAGNOSIS — Z6841 Body Mass Index (BMI) 40.0 and over, adult: Secondary | ICD-10-CM

## 2024-06-11 DIAGNOSIS — F50811 Binge eating disorder, moderate: Secondary | ICD-10-CM

## 2024-06-11 DIAGNOSIS — F331 Major depressive disorder, recurrent, moderate: Secondary | ICD-10-CM | POA: Diagnosis not present

## 2024-06-11 DIAGNOSIS — R632 Polyphagia: Secondary | ICD-10-CM

## 2024-06-12 LAB — TRYPTASE

## 2024-06-15 ENCOUNTER — Other Ambulatory Visit (HOSPITAL_BASED_OUTPATIENT_CLINIC_OR_DEPARTMENT_OTHER): Payer: Self-pay

## 2024-06-15 ENCOUNTER — Ambulatory Visit: Admitting: Family Medicine

## 2024-06-16 ENCOUNTER — Ambulatory Visit (HOSPITAL_BASED_OUTPATIENT_CLINIC_OR_DEPARTMENT_OTHER): Admitting: Physical Therapy

## 2024-06-17 ENCOUNTER — Ambulatory Visit: Payer: Self-pay | Admitting: Allergy & Immunology

## 2024-06-18 ENCOUNTER — Encounter: Payer: Self-pay | Admitting: Family Medicine

## 2024-06-18 DIAGNOSIS — F331 Major depressive disorder, recurrent, moderate: Secondary | ICD-10-CM | POA: Insufficient documentation

## 2024-06-18 DIAGNOSIS — D531 Other megaloblastic anemias, not elsewhere classified: Secondary | ICD-10-CM | POA: Insufficient documentation

## 2024-06-18 DIAGNOSIS — F5104 Psychophysiologic insomnia: Secondary | ICD-10-CM | POA: Insufficient documentation

## 2024-06-18 DIAGNOSIS — R632 Polyphagia: Secondary | ICD-10-CM | POA: Insufficient documentation

## 2024-06-18 DIAGNOSIS — F410 Panic disorder [episodic paroxysmal anxiety] without agoraphobia: Secondary | ICD-10-CM | POA: Insufficient documentation

## 2024-06-18 NOTE — Progress Notes (Signed)
 Assessment & Plan:   1. Polyphagia, controlled with Zepbound  and Vyvanse   2. Moderate binge-eating disorder, moderately controlled with Vyvanse   3. EDS (Ehlers-Danlos syndrome), followed by Ortho, SM, Allergy , PCP Comments: New Dx. Still undergoing workup for comorbid conditions. Reviewed the importance of pacing/energy conservation as she is starting a new full-time job.  4. Moderate episode of recurrent major depressive disorder (HCC) Comments: Stable. Continue Prozac , Trazodone. Starting new job - full time, it consultant. She is excited but nervous. Reviewed stress reduction techniques.  5. Immunization due (Primary) - Flu vaccine trivalent PF, 6mos and older(Flulaval,Afluria,Fluarix,Fluzone)  6. Class 3 severe obesity with serious comorbidity and body mass index (BMI) of 40.0 to 44.9 in adult, unspecified obesity type (HCC) Comments: Actively losing weight. Encouraged continue focus.  Geni Shutter, DO, MS, FAAFP, Dipl. [facility] [address] [place], [address] Dept: [phone] Dept Fax: [phone]  Subjective:   Patient is well known to me from my previous clinic and is now establishing care with me as their primary care provider.  Past medical history, surgical history, allergies, family history, immunizations andmedications were updated in the EMR today and reviewed under the history and medication portions of their EMR. Review of Systems: Negative, with the exception of above mentioned in HPI.  Current Outpatient Medications:    albuterol  (VENTOLIN  HFA) 108 (90 Base) MCG/ACT inhaler, Inhale 2 puffs into the lungs every 4 (four) hours as needed for wheezing or shortness of breath., Disp: 1 each, Rfl: 3   ALLERGY  RELIEF 180 MG tablet, TAKE 1 TABLET(180 MG) BY MOUTH DAILY, Disp: 90 tablet, Rfl: 1   BLISOVI  FE 1/20 1-20 MG-MCG tablet, Take 1 tablet by mouth daily., Disp: 84 tablet, Rfl: 4   cyanocobalamin  (VITAMIN B12)  1000 MCG tablet, Take by mouth., Disp: , Rfl:    cyclobenzaprine (FLEXERIL) 10 MG tablet, TAKE 1 TABLET BY MOUTH THREE TIMES DAILY FOR 15 DAYS AS NEEDED FOR MUSCLE SPASMS, Disp: , Rfl:    EPINEPHrine  0.3 mg/0.3 mL IJ SOAJ injection, Inject 0.3 mg into the muscle as needed for anaphylaxis., Disp: 1 each, Rfl: 2   famotidine  (PEPCID ) 20 MG tablet, TAKE 1 TABLET(20 MG) BY MOUTH TWICE DAILY, Disp: 180 tablet, Rfl: 0   FLUoxetine  (PROZAC ) 40 MG capsule, Take 1 capsule (40 mg total) by mouth daily., Disp: 90 capsule, Rfl: 0   gabapentin  (NEURONTIN ) 300 MG capsule, TAKE 1 CAPSULE BY MOUTH EVERY MORNING, 1 CAPSULE IN THE EVENING, AND 2 CAPSULES AT BEDTIME//Please call and make overdue appt for further refills, Disp: 120 capsule, Rfl: 0   glycopyrrolate  (ROBINUL ) 1 MG tablet, Take 1 tablet (1 mg total) by mouth in the morning, at noon, in the evening, and at bedtime., Disp: 120 tablet, Rfl: 4   HYDROcodone -acetaminophen  (NORCO/VICODIN) 5-325 MG tablet, Take 1 tablet by mouth every 6 (six) hours as needed., Disp: , Rfl:    levocetirizine (XYZAL ) 5 MG tablet, TAKE 1 TABLET(5 MG) BY MOUTH EVERY EVENING, Disp: 90 tablet, Rfl: 1   methocarbamol  (ROBAXIN ) 500 MG tablet, Take 1 tablet (500 mg total) by mouth 2 (two) times daily as needed for muscle spasms., Disp: 60 tablet, Rfl: 0   montelukast  (SINGULAIR ) 10 MG tablet, TAKE 1 TABLET(10 MG) BY MOUTH AT BEDTIME. NEED APPOINTMENT FOR ADDITIONAL REFILLS., Disp: 90 tablet, Rfl: 1   omeprazole (PRILOSEC) 20 MG capsule, 1 capsule 30 minutes before morning meal, Disp: , Rfl:    ondansetron  (ZOFRAN  ODT) 4 MG disintegrating tablet, Take 1  tablet (4 mg total) by mouth every 8 (eight) hours as needed for nausea or vomiting., Disp: 20 tablet, Rfl: 0   predniSONE  (STERAPRED UNI-PAK 48 TAB) 5 MG (48) TBPK tablet, 12 day dosepack po, Disp: 48 tablet, Rfl: 0   RAGWITEK 12 AMB A 1-U SUBL, DISSOLVE 1 TABLET UNDER THE TONGUE DAILY. BRING THIS PERSON TO YOUR APPOINTMENT AND YOU WILL TAKE  THE 1ST TABLET IN THE CLINIC, Disp: 30 tablet, Rfl: 5   SYRINGE-NEEDLE, DISP, 3 ML (EASYPOINT NEEDLE/SYRINGE) 25G X 5/8 3 ML MISC, Use one needle/syringe to inject Vitamin B12 into the muscle once every 7 days. *Disp. whatever insurance will cover*, Disp: , Rfl:    tirzepatide  (ZEPBOUND ) 15 MG/0.5ML Pen, Inject 15 mg into the skin once a week., Disp: 2 mL, Rfl: 0   traMADol (ULTRAM) 50 MG tablet, TAKE 1 TABLET BY MOUTH THREE TIMES DAILY FOR 5 DAYS AS NEEDED, Disp: , Rfl:    traZODone (DESYREL) 100 MG tablet, Take 100 mg by mouth at bedtime., Disp: , Rfl:    Vitamin D , Ergocalciferol , (DRISDOL ) 1.25 MG (50000 UNIT) CAPS capsule, Take 1 capsule (50,000 Units total) by mouth every 7 (seven) days., Disp: 4 capsule, Rfl: 0   XIIDRA 5 % SOLN, , Disp: , Rfl:    Lisdexamfetamine Dimesylate  50 MG CHEW, Chew 0.5 tablets by mouth 2 (two) times daily., Disp: , Rfl:    Objective:   BP 116/74 (BP Location: Left Arm, Cuff Size: Large)   Pulse 97   Temp (!) 97.2 F (36.2 C) (Oral)   Ht 5' 2 (1.575 m)   Wt 239 lb (108.4 kg)   SpO2 97%   BMI 43.71 kg/m   Wt Readings from Last 3 Encounters:  06/11/24 239 lb (108.4 kg)  06/09/24 241 lb (109.3 kg)  06/02/24 245 lb (111.1 kg)   Physical Exam Constitutional:      General: She is not in acute distress.    Appearance: She is well-developed. She is obese.  HENT:     Head: Normocephalic and atraumatic.  Eyes:     Conjunctiva/sclera: Conjunctivae normal.  Cardiovascular:     Rate and Rhythm: Normal rate and regular rhythm.     Heart sounds: Normal heart sounds.  Pulmonary:     Effort: Pulmonary effort is normal.     Breath sounds: Normal breath sounds.  Musculoskeletal:     Cervical back: Normal range of motion and neck supple.  Neurological:     General: No focal deficit present.     Mental Status: She is alert and oriented to person, place, and time.  Psychiatric:        Mood and Affect: Mood normal.        Behavior: Behavior normal.      Lab Results  Component Value Date   CREATININE 0.73 09/14/2021   BUN 12 09/14/2021   NA 140 09/14/2021   K 4.1 09/14/2021   CL 103 09/14/2021   CO2 21 09/14/2021   Lab Results  Component Value Date   ALT 12 09/14/2021   AST 13 09/14/2021   ALKPHOS 83 09/14/2021   BILITOT 0.2 09/14/2021   Lab Results  Component Value Date   HGBA1C 5.4 09/14/2021   HGBA1C 5.5 12/07/2020   HGBA1C 5.3 06/29/2020   HGBA1C 5.4 06/29/2019   HGBA1C 5.4 10/30/2018   Lab Results  Component Value Date   INSULIN  10.3 12/07/2020   INSULIN  13.7 06/29/2020   INSULIN  12.3 01/06/2020   INSULIN  17.7 06/29/2019  INSULIN  29.3 (H) 10/30/2018   Lab Results  Component Value Date   TSH 1.610 12/07/2020   Lab Results  Component Value Date   CHOL 153 09/14/2021   HDL 56 09/14/2021   LDLCALC 85 09/14/2021   TRIG 57 09/14/2021   CHOLHDL 3.0 12/07/2020   Lab Results  Component Value Date   VD25OH 37.2 09/14/2021   VD25OH 23.7 (L) 12/07/2020   VD25OH 29.3 (L) 06/29/2020   Lab Results  Component Value Date   WBC 7.5 09/14/2021   HGB 14.1 09/14/2021   HCT 43.2 09/14/2021   MCV 89 09/14/2021   PLT 268 09/14/2021   Lab Results  Component Value Date   IRON 56 09/14/2021   TIBC 301 09/14/2021   FERRITIN 94 09/14/2021

## 2024-06-24 ENCOUNTER — Ambulatory Visit (HOSPITAL_BASED_OUTPATIENT_CLINIC_OR_DEPARTMENT_OTHER): Attending: Family Medicine | Admitting: Physical Therapy

## 2024-06-28 NOTE — Progress Notes (Unsigned)
 [facility]  PATIENT: Cynthia MAUNEY DOB: [birth year]  REFERRING DOCTOR OR PCP: Charlie Dolores MD; Geni Shutter, DO SOURCE: Patient, notes from orthopedics/pain management, imaging and lab results, MRI images personally reviewed when available  _________________________________   HISTORICAL  CHIEF COMPLAINT:  Chief Complaint  Patient presents with   New Patient (Initial Visit)    Rm11, alone, referral for numbness in feet/Dr. Charlie Dolores Gaba [phone]    HISTORY OF PRESENT ILLNESS:  I had the pleasure to see your patient, Cynthia [name], at [facility] for neurologic consultation regarding her numbness, spine pain and presumptive diagnosis of Ehlers-Danlos syndrome  She is a 38 year old woman with possible Ehlers Danlos Syndrome (has not had genetic test as not covered by insurance) who is referred for further evaluation of foot numbness..    She has joint hypermobility and has been experiencing more musculoskeletal pain over the last couple years.  That led to the presumptive diagnosis of Ehlers-Danlos syndrome.. An Echo ws fine.  She was going to have a 2 week cardiac monitor for POTS/dysautonomia evaluation but she had an allergic reaction adhesive (only did 38 yo)   I previously seen her in 2021 for neck pain, back pain and right hand numbness.  Additionally, she was told by optometry that there may have been blurriness of the optic disc.  Last year, she fell off a ladder.  She did PT and saw Dr. Dolores at Orthopedics.   She has had more spine pain.   She had MRI cervical, thoracic and lumbar MRI.   She had mild anerolisthesis at [facility].  In thoracic spine, she had severe facet hypertrophy at T4-T5, T6-T7, T7-T8, T8-T9, T9-T10, T10-T11 also disc bulges at T9-T10 and T10-T11.    In the lumbar spine, she had DJD at L5S1 > L4L5 with possible involvement of the right S1 nerve root with moderate foraminal narrowing at some of these levels.    She  is currently noting a reduced grip.  She has more pins/needles in her feet - toes mostly but sometimes heels.    She is on gabapentin  and notes it helps (600 mg po qHS) but she notes some brain fog.   Meloxicam, naproxen, ibuprofen have not helped much.  Tramadol helps some  She has started Zepbound  and has lost some weight (down 25 pounds)   In the past, in 2019, she needed an open knee surgery on the left.      She sees Dr. Iva at Asthma/Allergy  due to her skin rashes felt to be allergic.   She is going to do a skin test later in the week.      Studies: NCV/EMG 05/03/2020 showed no evidence of polyneuropathy.  There was a possible mild chronic L5 radiculopathy  MRI of the lumbar spine 05/11/2020 showed mild facet hypertrophy at L5-S1 but no spinal stenosis or nerve root compression.  Brain 05/11/2020 with and without contrast was normal      REVIEW OF SYSTEMS: Constitutional: No fevers, chills, sweats, or change in appetite Eyes: No visual changes, double vision, eye pain Ear, nose and throat: No hearing loss, ear pain, nasal congestion, sore throat Cardiovascular: No chest pain, palpitations Respiratory:  No shortness of breath at rest or with exertion.   No wheezes GastrointestinaI: No nausea, vomiting, diarrhea, abdominal pain, fecal incontinence Genitourinary:  No dysuria, urinary retention or frequency.  No nocturia. Musculoskeletal:  No neck pain, back pain Integumentary: No rash, pruritus, skin lesions Neurological: as above  Psychiatric: No depression at this time.  No anxiety Endocrine: No palpitations, diaphoresis, change in appetite, change in weigh or increased thirst Hematologic/Lymphatic:  No anemia, purpura, petechiae. Allergic/Immunologic: No itchy/runny eyes, nasal congestion, recent allergic reactions, rashes  ALLERGIES: Allergies  Allergen Reactions   Aquacel [Carboxymethylcellulose] Itching, Rash and Other (See Comments)    Caused blisters    Tobramycin Rash   Penicillins     Unknown reaction Has patient had a PCN reaction causing immediate rash, facial/tongue/throat swelling, SOB or lightheadedness with hypotension: Unknown Has patient had a PCN reaction causing severe rash involving mucus membranes or skin necrosis: Unknown Has patient had a PCN reaction that required hospitalization: No Has patient had a PCN reaction occurring within the last 10 years: No If all of the above answers are NO, then may proceed with Cephalosporin use.  Other reaction(s): unknown as a baby   Silicone Dermatitis    silicones   Adhesive [Tape] Rash    Prefers to use paper tape   Latex Rash   Oxycodone  Itching and Rash    HOME MEDICATIONS:  Current Outpatient Medications:    albuterol  (VENTOLIN  HFA) 108 (90 Base) MCG/ACT inhaler, Inhale 2 puffs into the lungs every 4 (four) hours as needed for wheezing or shortness of breath., Disp: 1 each, Rfl: 3   ALLERGY  RELIEF 180 MG tablet, TAKE 1 TABLET(180 MG) BY MOUTH DAILY, Disp: 90 tablet, Rfl: 1   BLISOVI  FE 1/20 1-20 MG-MCG tablet, Take 1 tablet by mouth daily., Disp: 84 tablet, Rfl: 4   cyanocobalamin  (VITAMIN B12) 1000 MCG tablet, Take by mouth., Disp: , Rfl:    cyclobenzaprine (FLEXERIL) 10 MG tablet, TAKE 1 TABLET BY MOUTH THREE TIMES DAILY FOR 15 DAYS AS NEEDED FOR MUSCLE SPASMS, Disp: , Rfl:    EPINEPHrine  0.3 mg/0.3 mL IJ SOAJ injection, Inject 0.3 mg into the muscle as needed for anaphylaxis., Disp: 1 each, Rfl: 2   famotidine  (PEPCID ) 20 MG tablet, TAKE 1 TABLET(20 MG) BY MOUTH TWICE DAILY, Disp: 180 tablet, Rfl: 0   FLUoxetine  (PROZAC ) 40 MG capsule, Take 1 capsule (40 mg total) by mouth daily., Disp: 90 capsule, Rfl: 0   gabapentin  (NEURONTIN ) 300 MG capsule, TAKE 1 CAPSULE BY MOUTH EVERY MORNING, 1 CAPSULE IN THE EVENING, AND 2 CAPSULES AT BEDTIME//Please call and make overdue appt for further refills, Disp: 120 capsule, Rfl: 0   glycopyrrolate  (ROBINUL ) 1 MG tablet, Take 1 tablet (1 mg  total) by mouth in the morning, at noon, in the evening, and at bedtime., Disp: 120 tablet, Rfl: 4   HYDROcodone -acetaminophen  (NORCO/VICODIN) 5-325 MG tablet, Take 1 tablet by mouth every 6 (six) hours as needed., Disp: , Rfl:    levocetirizine (XYZAL ) 5 MG tablet, TAKE 1 TABLET(5 MG) BY MOUTH EVERY EVENING, Disp: 90 tablet, Rfl: 1   Lisdexamfetamine Dimesylate  50 MG CHEW, Chew 0.5 tablets by mouth 2 (two) times daily., Disp: , Rfl:    methocarbamol  (ROBAXIN ) 500 MG tablet, Take 1 tablet (500 mg total) by mouth 2 (two) times daily as needed for muscle spasms., Disp: 60 tablet, Rfl: 0   montelukast  (SINGULAIR ) 10 MG tablet, TAKE 1 TABLET(10 MG) BY MOUTH AT BEDTIME. NEED APPOINTMENT FOR ADDITIONAL REFILLS., Disp: 90 tablet, Rfl: 1   omeprazole (PRILOSEC) 20 MG capsule, 1 capsule 30 minutes before morning meal, Disp: , Rfl:    ondansetron  (ZOFRAN  ODT) 4 MG disintegrating tablet, Take 1 tablet (4 mg total) by mouth every 8 (eight) hours as needed for nausea or vomiting., Disp:  20 tablet, Rfl: 0   predniSONE  (STERAPRED UNI-PAK 48 TAB) 5 MG (48) TBPK tablet, 12 day dosepack po, Disp: 48 tablet, Rfl: 0   RAGWITEK 12 AMB A 1-U SUBL, DISSOLVE 1 TABLET UNDER THE TONGUE DAILY. BRING THIS PERSON TO YOUR APPOINTMENT AND YOU WILL TAKE THE 1ST TABLET IN THE CLINIC, Disp: 30 tablet, Rfl: 5   SYRINGE-NEEDLE, DISP, 3 ML (EASYPOINT NEEDLE/SYRINGE) 25G X 5/8 3 ML MISC, Use one needle/syringe to inject Vitamin B12 into the muscle once every 7 days. *Disp. whatever insurance will cover*, Disp: , Rfl:    tirzepatide  (ZEPBOUND ) 15 MG/0.5ML Pen, Inject 15 mg into the skin once a week., Disp: 2 mL, Rfl: 0   traMADol (ULTRAM) 50 MG tablet, TAKE 1 TABLET BY MOUTH THREE TIMES DAILY FOR 5 DAYS AS NEEDED, Disp: , Rfl:    traZODone (DESYREL) 100 MG tablet, Take 100 mg by mouth at bedtime., Disp: , Rfl:    Vitamin D , Ergocalciferol , (DRISDOL ) 1.25 MG (50000 UNIT) CAPS capsule, Take 1 capsule (50,000 Units total) by mouth every 7  (seven) days., Disp: 4 capsule, Rfl: 0   XIIDRA 5 % SOLN, , Disp: , Rfl:   PAST MEDICAL HISTORY: Past Medical History:  Diagnosis Date   ADHD (attention deficit hyperactivity disorder)    Allergic rhinitis    Anxiety    Asthma    Back injury    COMPETITIVE INJURY IN HIGH SCHOOL   Back pain    Depression    Ehlers-Danlos syndrome    Fatty liver    Fibromyalgia    Gallbladder problem    GERD (gastroesophageal reflux disease)    IBS (irritable bowel syndrome)    Insulin  resistance    Joint pain    Kidney stones    Migraines    no aura   OA (osteoarthritis) of knee    Obesity    Pneumonia    PONV (postoperative nausea and vomiting)    Seasonal allergies    Vitamin D  deficiency     PAST SURGICAL HISTORY: Past Surgical History:  Procedure Laterality Date   CHOLECYSTECTOMY     DG DILATION URETERS     KNEE ARTHROSCOPY Left    KNEE ARTHROSCOPY WITH LATERAL MENISECTOMY Right 04/11/2018   Procedure: RIGHT KNEE ARTHROSCOPY WITH PARTIAL MENISECTOMY CHONDROPLASTY;  Surgeon: Gerome Charleston, MD;  Location: [facility];  Service: Orthopedics;  Laterality: Right;   MOUTH SURGERY     SHOULDER SURGERY  2002   RIGHT   WISDOM TOOTH EXTRACTION     WRIST ARTHROSCOPY     Right    FAMILY HISTORY: Family History  Problem Relation Age of Onset   Multiple sclerosis Mother    Diabetes Mother    Hyperlipidemia Mother    Thyroid disease Mother    Depression Mother    Anxiety disorder Mother    Obesity Mother    Allergic rhinitis Mother    Hypertension Father    Diabetes Father    Hyperlipidemia Father    Obesity Father    Allergic rhinitis Father    Diabetes Maternal Grandfather    Cancer Maternal Grandfather        prostate   Heart disease Maternal Grandfather    Heart disease Paternal Grandfather    Allergic rhinitis Paternal Aunt    Allergic rhinitis Maternal Grandmother    Asthma Neg Hx    Eczema Neg Hx    Urticaria Neg Hx     SOCIAL HISTORY: Social History    Socioeconomic History  Marital status: Single    Spouse name: Not on file   Number of children: 0   Years of education: some college   Highest education level: Not on file  Occupational History   Occupation: not working d/t knees  Tobacco Use   Smoking status: Never   Smokeless tobacco: Never  Vaping Use   Vaping status: Never Used  Substance and Sexual Activity   Alcohol use: Yes    Comment: socially   Drug use: No   Sexual activity: Not Currently    Birth control/protection: Pill  Other Topics Concern   Not on file  Social History Narrative   Lives with parents   Caffeine use: 2-3 times per week   Right handed   Social Drivers of Corporate Investment Banker Strain: Not on file  Food Insecurity: No Food Insecurity (05/28/2022)   [facility]   Hunger Vital Sign    Within the past 12 months, you worried that your food would run out before you got the money to buy more.: Never true    Within the past 12 months, the food you bought just didn't last and you didn't have money to get more.: Never true  Transportation Needs: Not on file  Physical Activity: Not on file  Stress: Not on file  Social Connections: Unknown (05/15/2022)   [facility]   Social Network    Social Network: Not on file  Intimate Partner Violence: Unknown (05/15/2022)   Received from [facility]   HITS    Physically Hurt: Not on file    Insult or Talk Down To: Not on file    Threaten Physical Harm: Not on file    Scream or Curse: Not on file       PHYSICAL EXAM  Vitals:   06/29/24 1106  BP: 122/88  Resp: 16  Weight: 240 lb 8 oz (109.1 kg)  Height: 5' 2 (1.575 m)    Body mass index is 43.99 kg/m.   General: The patient is well-developed and well-nourished and in no acute distress  HEENT:  Head is [place]/AT.  Sclera are anicteric.    Neck: No carotid bruits are noted.  The neck had a good range of motion.  Cardiovascular: The heart has a regular  rate and rhythm with a normal S1 and S2. There were no murmurs, gallops or rubs.    Skin: Extremities are without rash or  edema.  Musculoskeletal:  Back is nontender  Neurologic Exam  Mental status: The patient is alert and oriented x 3 at the time of the examination. The patient has apparent normal recent and remote memory, with an apparently normal attention span and concentration ability.   Speech is normal.  Cranial nerves: Extraocular movements are full. Pupils are equal, round, and reactive to light and accomodation. There is good facial sensation to soft touch bilaterally.Facial strength is normal.  Trapezius and sternocleidomastoid strength is normal. No dysarthria is noted.  The tongue is midline, and the patient has symmetric elevation of the soft palate. No obvious hearing deficits are noted.  Motor:  Muscle bulk is normal.   Tone is normal. Strength is  5 / 5 in all 4 extremities.   Sensory: Sensory testing is intact to pinprick, soft touch and vibration sensation in the arms except for slight reduced sensation to touch over the thenar eminence on the left.  There was a Tinel's and Phalen sign on the left.  She also had reduced  vibration sensation in the toes relative to the ankles and knees.  Right knee with slightly less than left knee.  Coordination: Cerebellar testing reveals good finger-nose-finger and heel-to-shin bilaterally.  Gait and station: Station is normal.   Gait is arthritic. Tandem gait is slightly wide. Romberg is negative.   Reflexes: Deep tendon reflexes are symmetric and normal bilaterally.   Plantar responses are flexor.    DIAGNOSTIC DATA (LABS, IMAGING, TESTING) - I reviewed patient records, labs, notes, testing and imaging myself where available.  Lab Results  Component Value Date   WBC 7.5 09/14/2021   HGB 14.1 09/14/2021   HCT 43.2 09/14/2021   MCV 89 09/14/2021   PLT 268 09/14/2021      Component Value Date/Time   NA 140 09/14/2021 0947    K 4.1 09/14/2021 0947   CL 103 09/14/2021 0947   CO2 21 09/14/2021 0947   GLUCOSE 91 09/14/2021 0947   GLUCOSE 87 04/08/2018 1420   BUN 12 09/14/2021 0947   CREATININE 0.73 09/14/2021 0947   CALCIUM 8.8 09/14/2021 0947   PROT 6.5 09/14/2021 0947   ALBUMIN 4.1 09/14/2021 0947   AST 13 09/14/2021 0947   ALT 12 09/14/2021 0947   ALKPHOS 83 09/14/2021 0947   BILITOT 0.2 09/14/2021 0947   GFRNONAA 91 06/29/2020 1437   GFRAA 105 06/29/2020 1437   Lab Results  Component Value Date   CHOL 153 09/14/2021   HDL 56 09/14/2021   LDLCALC 85 09/14/2021   TRIG 57 09/14/2021   CHOLHDL 3.0 12/07/2020   Lab Results  Component Value Date   HGBA1C 5.4 09/14/2021   Lab Results  Component Value Date   VITAMINB12 308 09/14/2021   Lab Results  Component Value Date   TSH 1.610 12/07/2020       ASSESSMENT AND PLAN  Facet hypertrophy  Spine degeneration  Dysautonomia (HCC)  Left carpal tunnel syndrome  In summary, Cynthia [name] is a 38 year old woman with spinal pain, foot and left hand numbness who has a presumptive diagnosis of Ehlers-Danlos syndrome  Suspected EDS - we will check an EDS panel to help determine if this is playing a role and determine subtype to help her optimize treatment. Etodolac  400 mg po bid for musculoskeletal pain Consider switching fluoxetine  to duloxetine as may help pain more.  MS is unlikely - will hold off on repeat MRI To further evaluate the numbness we will check an NCV/EMG of 1 leg in the left hand.   Continue Zepbound  for weight loss, try exercise more  Thank you for asking me to see Cynthia [name].  Please let me know if I can be of further assistance with her or other patients in the future.  Daishaun Ayre A. Vear, MD, [facility] 06/29/2024, 11:42 AM Certified in Neurology, Clinical Neurophysiology, Sleep Medicine and Neuroimaging  [facility] [address] [place], [place] [address] [phone]

## 2024-06-29 ENCOUNTER — Ambulatory Visit: Admitting: Neurology

## 2024-06-29 ENCOUNTER — Encounter: Payer: Self-pay | Admitting: Neurology

## 2024-06-29 ENCOUNTER — Other Ambulatory Visit: Payer: Self-pay | Admitting: Dermatology

## 2024-06-29 VITALS — BP 122/88 | Resp 16 | Ht 62.0 in | Wt 240.5 lb

## 2024-06-29 DIAGNOSIS — L74519 Primary focal hyperhidrosis, unspecified: Secondary | ICD-10-CM

## 2024-06-29 DIAGNOSIS — G5602 Carpal tunnel syndrome, left upper limb: Secondary | ICD-10-CM | POA: Diagnosis not present

## 2024-06-29 DIAGNOSIS — R200 Anesthesia of skin: Secondary | ICD-10-CM

## 2024-06-29 DIAGNOSIS — G901 Familial dysautonomia [Riley-Day]: Secondary | ICD-10-CM | POA: Diagnosis not present

## 2024-06-29 DIAGNOSIS — M479 Spondylosis, unspecified: Secondary | ICD-10-CM | POA: Diagnosis not present

## 2024-06-29 DIAGNOSIS — M47819 Spondylosis without myelopathy or radiculopathy, site unspecified: Secondary | ICD-10-CM | POA: Insufficient documentation

## 2024-06-29 DIAGNOSIS — M249 Joint derangement, unspecified: Secondary | ICD-10-CM

## 2024-06-29 MED ORDER — ETODOLAC 400 MG PO TABS: 400.0000 mg | ORAL_TABLET | Freq: Two times a day (BID) | ORAL | 5 refills | Status: AC

## 2024-06-30 ENCOUNTER — Ambulatory Visit: Admitting: Family Medicine

## 2024-06-30 VITALS — BP 112/82 | HR 92 | Ht 62.0 in | Wt 242.2 lb

## 2024-06-30 DIAGNOSIS — R300 Dysuria: Secondary | ICD-10-CM

## 2024-06-30 LAB — POC URINALSYSI DIPSTICK (AUTOMATED)
Bilirubin, UA: NEGATIVE
Blood, UA: NEGATIVE
Glucose, UA: NEGATIVE
Ketones, UA: NEGATIVE
Leukocytes, UA: NEGATIVE
Nitrite, UA: NEGATIVE
Protein, UA: NEGATIVE
Spec Grav, UA: 1.015 (ref 1.010–1.025)
Urobilinogen, UA: 0.2 U/dL
pH, UA: 6 (ref 5.0–8.0)

## 2024-06-30 MED ORDER — CIPROFLOXACIN HCL 250 MG PO TABS: 250.0000 mg | ORAL_TABLET | Freq: Two times a day (BID) | ORAL | 0 refills | Status: AC

## 2024-06-30 NOTE — Progress Notes (Unsigned)
   [address] [place] [address] Dept: [phone]  FOLLOW UP NOTE  Patient ID: Cynthia [name], female    DOB: 07-May-1986  Age: 38 y.o. MRN: [id number] Date of Office Visit: 07/02/2024  Assessment  Chief Complaint: No chief complaint on file.  HPI Cynthia [name]   Discussed the use of AI scribe software for clinical note transcription with the patient, who gave verbal consent to proceed.  History of Present Illness   [place] Balsam,Colophonium ,   Drug Allergies:  Allergies  Allergen Reactions  . Aquacel [Carboxymethylcellulose] Itching, Rash and Other (See Comments)    Caused blisters  . Tobramycin Rash  . Penicillins     Unknown reaction Has patient had a PCN reaction causing immediate rash, facial/tongue/throat swelling, SOB or lightheadedness with hypotension: Unknown Has patient had a PCN reaction causing severe rash involving mucus membranes or skin necrosis: Unknown Has patient had a PCN reaction that required hospitalization: No Has patient had a PCN reaction occurring within the last 10 years: No If all of the above answers are NO, then may proceed with Cephalosporin use.  Other reaction(s): unknown as a baby  . Silicone Dermatitis    silicones  . Adhesive [Tape] Rash    Prefers to use paper tape  . Latex Rash  . Oxycodone  Itching and Rash    Physical Exam: There were no vitals taken for this visit.   Physical Exam  Diagnostics:    Assessment and Plan: No diagnosis found.  No orders of the defined types were placed in this encounter.   There are no Patient Instructions on file for this visit.  No follow-ups on file.    Thank you for the opportunity to care for this patient.  Please do not hesitate to contact me with questions.  Arlean Mutter, FNP [facility]

## 2024-07-01 ENCOUNTER — Ambulatory Visit (HOSPITAL_BASED_OUTPATIENT_CLINIC_OR_DEPARTMENT_OTHER): Admitting: Physical Therapy

## 2024-07-01 ENCOUNTER — Telehealth: Payer: Self-pay | Admitting: Pharmacy Technician

## 2024-07-01 ENCOUNTER — Other Ambulatory Visit (HOSPITAL_COMMUNITY): Payer: Self-pay

## 2024-07-01 NOTE — Telephone Encounter (Signed)
 Pharmacy Patient Advocate Encounter   Received notification from Fax that prior authorization for Etodolac  400MG  tablets  is required/requested.   Insurance verification completed.   The patient is insured through [employer].   Per test claim: PA required; PA submitted to above mentioned insurance via Latent Key/confirmation #/EOC [id number] Status is pending

## 2024-07-02 ENCOUNTER — Encounter: Payer: Self-pay | Admitting: Family Medicine

## 2024-07-02 ENCOUNTER — Ambulatory Visit: Admitting: Family Medicine

## 2024-07-02 ENCOUNTER — Other Ambulatory Visit (HOSPITAL_COMMUNITY): Payer: Self-pay

## 2024-07-02 DIAGNOSIS — D8940 Mast cell activation, unspecified: Secondary | ICD-10-CM | POA: Insufficient documentation

## 2024-07-02 DIAGNOSIS — J3089 Other allergic rhinitis: Secondary | ICD-10-CM | POA: Diagnosis not present

## 2024-07-02 DIAGNOSIS — R232 Flushing: Secondary | ICD-10-CM | POA: Insufficient documentation

## 2024-07-02 DIAGNOSIS — J302 Other seasonal allergic rhinitis: Secondary | ICD-10-CM

## 2024-07-02 DIAGNOSIS — L2389 Allergic contact dermatitis due to other agents: Secondary | ICD-10-CM

## 2024-07-02 DIAGNOSIS — J4520 Mild intermittent asthma, uncomplicated: Secondary | ICD-10-CM

## 2024-07-02 NOTE — Telephone Encounter (Signed)
 Pharmacy Patient Advocate Encounter  Received notification from [employer] that Prior Authorization for Etodolac  has been APPROVED from 07/01/2024 to 07/01/2025. Unable to obtain price due to refill too soon rejection, last fill date 07/02/2024 next available fill date1/09/2024   PA #/Case ID/Reference #: [id number]

## 2024-07-02 NOTE — Patient Instructions (Addendum)
 Seasonal and perennial allergic rhinitis  Your skin testing was positive to grass pollen, weed pollen, ragweed pollen, tree pollen, seasonal mold mix 1 Previous skin testing was positive to grasses, weeds, trees, ragweed, indoor/outdoor molds)  -Continue Ragwitek 1 sublingual tablet once a day. - Continue Xyzal  5 mg once daily.  - Continue with Singulair  10mg  daily.  - Continue Qnasl  2 sprays in each nostril once a day as needed for a stuffy nose - Continue with Pataday  one drop in each eye once a day as needed for red, itchy eyes OR Zaditor one drop in each eye twice a day as needed for red itchy eyes.  Asthma- Continue montelukast  10 mg once a day to prevent cough or wheeze - Continue albuterol  2 puffs once every 4 hours as needed for cough or wheeze - Continue albuterol  2 puffs 5 to 15 minutes before activity to decrease cough or wheeze  POTS - Continue with the current workup and management.   Rash with contact dermatitis  - Continue to avoid balsam of [place], cocoa phoneme, hydroperoxide of linalool, fragrance mix 1, textile dye 2, nickel, and propilis. - Flushing today (getting tryptase today). - Check your spam folder for the Safe List, but call us  if you do not find it.  . Concern for MCAS -Selected food allergy  skin testing was borderline positive to peanut, however, you are eating peanut at this time without any issues so continue to consume peanuts - Testing was all negative for MCAS. - Standing tryptase ordered and paper provided in case you  need to get this done again.  - Continue with the alternating antihistamines like you are doing. - Continue with the Pepcid  (famotidine ) 20mg  twice daily. - Continue with the Singulair  (montelukast ) 10mg  daily.  - We will send in Neffy  instead of the EpiPen  - We could start Xolair for long term management to stabilize your mast cells.   Call the clinic if this treatment plan is not working well for you  Follow up in 1 month or sooner  if needed.

## 2024-07-05 ENCOUNTER — Encounter: Payer: Self-pay | Admitting: Family Medicine

## 2024-07-05 NOTE — Progress Notes (Signed)
 The patient is being seen today for an acute visit.  Subjective:   Chief Complaint  Patient presents with   Dysuria    Pt is here today to be seen for possible UTI. She has had burning and stinging with urgency. This started around 06/21/2024.     Reviewed by clinician on day of visit: allergies, medications, problem list, medical history, surgical history, family history, social history, and previous encounter notes.  Review of Systems: Negative, with the exception of above mentioned in HPI.  Active Medications[1]  Objective:   BP 112/82 (BP Location: Right Arm, Cuff Size: Large)   Pulse 92   Ht 5' 2 (1.575 m)   Wt 242 lb 3.2 oz (109.9 kg)   SpO2 98%   BMI 44.30 kg/m   Physical Exam Constitutional:      General: She is not in acute distress.    Appearance: She is well-developed.  HENT:     Head: Normocephalic and atraumatic.  Eyes:     Conjunctiva/sclera: Conjunctivae normal.  Cardiovascular:     Rate and Rhythm: Normal rate and regular rhythm.     Heart sounds: Normal heart sounds.  Pulmonary:     Effort: Pulmonary effort is normal.     Breath sounds: Normal breath sounds.  Neurological:     General: No focal deficit present.     Mental Status: She is alert.  Psychiatric:        Behavior: Behavior normal.    Results for orders placed or performed in visit on 06/30/24  POCT Urinalysis Dipstick (Automated)   Collection Time: 06/30/24 10:18 AM  Result Value Ref Range   Color, UA yellow    Clarity, UA clear    Glucose, UA Negative Negative   Bilirubin, UA Negative    Ketones, UA Negative    Spec Grav, UA 1.015 1.010 - 1.025   Blood, UA Negative    pH, UA 6.0 5.0 - 8.0   Protein, UA Negative Negative   Urobilinogen, UA 0.2 0.2 or 1.0 E.U./dL   Nitrite, UA Negative    Leukocytes, UA Negative Negative    Assessment & Plan:   [name] was seen today for dysuria.  Diagnoses and all orders for this visit:  Dysuria -     POCT Urinalysis Dipstick  (Automated) -     ciprofloxacin  (CIPRO ) 250 MG tablet; Take 1 tablet (250 mg total) by mouth 2 (two) times daily for 3 days.  Reviewed the importance of hydration. No symptoms c/w vaginitis. No Hx of IC. Not sexually active. Safety net Rx provided with Hx of UTIs. Red flags reviewed.   Geni Shutter, DO, MS, FAAFP, Dipl. [facility] [address] [place], [address] Dept: [phone] Dept Fax: [phone]     [1]  Current Meds  Medication Sig   albuterol  (VENTOLIN  HFA) 108 (90 Base) MCG/ACT inhaler Inhale 2 puffs into the lungs every 4 (four) hours as needed for wheezing or shortness of breath.   ALLERGY  RELIEF 180 MG tablet TAKE 1 TABLET(180 MG) BY MOUTH DAILY   BLISOVI  FE 1/20 1-20 MG-MCG tablet Take 1 tablet by mouth daily.   [EXPIRED] ciprofloxacin  (CIPRO ) 250 MG tablet Take 1 tablet (250 mg total) by mouth 2 (two) times daily for 3 days.   cyanocobalamin  (VITAMIN B12) 1000 MCG tablet Take by mouth.   cyclobenzaprine (FLEXERIL) 10 MG tablet TAKE 1 TABLET BY MOUTH THREE TIMES DAILY FOR 15 DAYS AS NEEDED FOR MUSCLE SPASMS  EPINEPHrine  0.3 mg/0.3 mL IJ SOAJ injection Inject 0.3 mg into the muscle as needed for anaphylaxis.   etodolac  (LODINE ) 400 MG tablet Take 1 tablet (400 mg total) by mouth 2 (two) times daily.   famotidine  (PEPCID ) 20 MG tablet TAKE 1 TABLET(20 MG) BY MOUTH TWICE DAILY   FLUoxetine  (PROZAC ) 40 MG capsule Take 1 capsule (40 mg total) by mouth daily.   gabapentin  (NEURONTIN ) 300 MG capsule TAKE 1 CAPSULE BY MOUTH EVERY MORNING, 1 CAPSULE IN THE EVENING, AND 2 CAPSULES AT BEDTIME//Please call and make overdue appt for further refills   glycopyrrolate  (ROBINUL ) 1 MG tablet TAKE 1 TABLET BY MOUTH IN THE MORNING, AT NOON, IN THE EVENING, AND AT BEDTIME   HYDROcodone -acetaminophen  (NORCO/VICODIN) 5-325 MG tablet Take 1 tablet by mouth every 6 (six) hours as needed.   levocetirizine (XYZAL ) 5 MG tablet TAKE 1 TABLET(5 MG)  BY MOUTH EVERY EVENING   Lisdexamfetamine  Dimesylate 50 MG CHEW Chew 0.5 tablets by mouth 2 (two) times daily.   methocarbamol  (ROBAXIN ) 500 MG tablet Take 1 tablet (500 mg total) by mouth 2 (two) times daily as needed for muscle spasms.   montelukast  (SINGULAIR ) 10 MG tablet TAKE 1 TABLET(10 MG) BY MOUTH AT BEDTIME. NEED APPOINTMENT FOR ADDITIONAL REFILLS.   omeprazole (PRILOSEC) 20 MG capsule 1 capsule 30 minutes before morning meal   ondansetron  (ZOFRAN  ODT) 4 MG disintegrating tablet Take 1 tablet (4 mg total) by mouth every 8 (eight) hours as needed for nausea or vomiting.   predniSONE  (STERAPRED UNI-PAK 48 TAB) 5 MG (48) TBPK tablet 12 day dosepack po   RAGWITEK 12 AMB A 1-U SUBL DISSOLVE 1 TABLET UNDER THE TONGUE DAILY. BRING THIS PERSON TO YOUR APPOINTMENT AND YOU WILL TAKE THE 1ST TABLET IN THE CLINIC   SYRINGE-NEEDLE, DISP, 3 ML (EASYPOINT NEEDLE/SYRINGE) 25G X 5/8 3 ML MISC Use one needle/syringe to inject Vitamin B12 into the muscle once every 7 days. *Disp. whatever insurance will cover*   tirzepatide  (ZEPBOUND ) 15 MG/0.5ML Pen Inject 15 mg into the skin once a week.   traMADol (ULTRAM) 50 MG tablet TAKE 1 TABLET BY MOUTH THREE TIMES DAILY FOR 5 DAYS AS NEEDED   traZODone (DESYREL) 100 MG tablet Take 100 mg by mouth at bedtime.   Vitamin D , Ergocalciferol , (DRISDOL ) 1.25 MG (50000 UNIT) CAPS capsule Take 1 capsule (50,000 Units total) by mouth every 7 (seven) days.   XIIDRA 5 % SOLN

## 2024-07-08 ENCOUNTER — Ambulatory Visit (HOSPITAL_BASED_OUTPATIENT_CLINIC_OR_DEPARTMENT_OTHER): Admitting: Physical Therapy

## 2024-07-14 ENCOUNTER — Ambulatory Visit: Admitting: Allergy & Immunology

## 2024-07-14 ENCOUNTER — Encounter: Payer: Self-pay | Admitting: Allergy & Immunology

## 2024-07-14 ENCOUNTER — Other Ambulatory Visit: Payer: Self-pay

## 2024-07-14 VITALS — BP 104/78 | HR 100 | Temp 98.0°F

## 2024-07-14 DIAGNOSIS — J3089 Other allergic rhinitis: Secondary | ICD-10-CM | POA: Diagnosis not present

## 2024-07-14 DIAGNOSIS — D8940 Mast cell activation, unspecified: Secondary | ICD-10-CM | POA: Diagnosis not present

## 2024-07-14 DIAGNOSIS — J302 Other seasonal allergic rhinitis: Secondary | ICD-10-CM

## 2024-07-14 DIAGNOSIS — R232 Flushing: Secondary | ICD-10-CM | POA: Diagnosis not present

## 2024-07-14 DIAGNOSIS — L2389 Allergic contact dermatitis due to other agents: Secondary | ICD-10-CM

## 2024-07-14 DIAGNOSIS — J4520 Mild intermittent asthma, uncomplicated: Secondary | ICD-10-CM | POA: Diagnosis not present

## 2024-07-14 MED ORDER — AZELASTINE HCL 0.1 % NA SOLN: 1.0000 | Freq: Two times a day (BID) | NASAL | 1 refills | Status: AC

## 2024-07-14 NOTE — Patient Instructions (Addendum)
 Asthma - Lung testing not done today.  - Continue montelukast  10 mg once a day to prevent cough or wheeze - Continue albuterol  2 puffs once every 4 hours as needed for cough or wheeze - Continue albuterol  2 puffs 5 to 15 minutes before activity to decrease cough or wheeze  Seasonal and perennial allergic rhinitis (grasses, weeds, trees, ragweed, indoor/outdoor molds)  -Continue Ragwitek 1 sublingual tablet once a day. - Continue Xyzal  5 mg once daily.  - Continue with Singulair  10mg  daily.  - We will add on the Astelin  up to two sprays per nostril twice daily to help with postnasal drip (this will be covered better than Qnasl ).  - Continue with Pataday  one drop in each eye once a day as needed for red, itchy eyes OR Zaditor one drop in each eye twice a day as needed for red itchy eyes.  POTS - Continue with the current workup and management.   Rash with contact dermatitis  - Continue to avoid balsam of [place], cocoa phoneme, hydroperoxide of linalool, fragrance mix 1, textile dye 2, nickel, and propilis. - Flushing today (getting tryptase today). - Check your spam folder for the Safe List, but call us  if you do not find it.   Concern for MCAS - Testing was all negative for MCAS. - Tryptase order printed off for you. - We can consider doing Rhapsido in the future to prevent any outbreaks. - This is recently approved to treat chronic hives and has been helpful for those who did not respond to Xolair. - The nice thing about it is that if you DO react to it, it will be out of your body more quickly than Xolair.    Return in about 3 months (around 10/12/2024). You can have the follow up appointment with Dr. Iva or a Nurse Practicioner (our Nurse Practitioners are excellent and always have Physician oversight!).    Please inform us  of any Emergency Department visits, hospitalizations, or changes in symptoms. Call us  before going to the ED for breathing or allergy  symptoms since we might  be able to fit you in for a sick visit. Feel free to contact us  anytime with any questions, problems, or concerns.  It was a pleasure to see you again today!  Websites that have reliable patient information: 1. American Academy of Asthma, Allergy , and Immunology: www.aaaai.org 2. Food Allergy  Research and Education (FARE): foodallergy.org 3. Mothers of Asthmatics: http://www.asthmacommunitynetwork.org 4. American College of Allergy , Asthma, and Immunology: www.acaai.org      Like us  on [employer] and Instagram for our latest updates!      A healthy democracy works best when [employer] participate! Make sure you are registered to vote! If you have moved or changed any of your contact information, you will need to get this updated before voting! Scan the QR codes below to learn more!

## 2024-07-14 NOTE — Progress Notes (Signed)
 "  FOLLOW UP  Date of Service/Encounter:  07/14/2024   Assessment:   Mast cell activation   Mild intermittent asthma without complication    Seasonal and perennial allergic rhinitis (grasses, weeds, trees, ragweed, indoor/outdoor molds)   Rash and contact dermatitis   Plan/Recommendations:   Asthma - Lung testing not done today.  - Continue montelukast  10 mg once a day to prevent cough or wheeze - Continue albuterol  2 puffs once every 4 hours as needed for cough or wheeze - Continue albuterol  2 puffs 5 to 15 minutes before activity to decrease cough or wheeze  Seasonal and perennial allergic rhinitis (grasses, weeds, trees, ragweed, indoor/outdoor molds)  -Continue Ragwitek 1 sublingual tablet once a day. - Continue Xyzal  5 mg once daily.  - Continue with Singulair  10mg  daily.  - We will add on the Astelin  up to two sprays per nostril twice daily to help with postnasal drip (this will be covered better than Qnasl ).  - Continue with Pataday  one drop in each eye once a day as needed for red, itchy eyes OR Zaditor one drop in each eye twice a day as needed for red itchy eyes.  POTS - Continue with the current workup and management.   Rash with contact dermatitis  - Continue to avoid balsam of [place], cocoa phoneme, hydroperoxide of linalool, fragrance mix 1, textile dye 2, nickel, and propilis. - Flushing today (getting tryptase today). - Check your spam folder for the Safe List, but call us  if you do not find it.   Concern for MCAS - Testing was all negative for MCAS. - Tryptase order printed off for you. - We can consider doing Rhapsido in the future to prevent any outbreaks. - This is recently approved to treat chronic hives and has been helpful for those who did not respond to Xolair. - The nice thing about it is that if you DO react to it, it will be out of your body more quickly than Xolair.  Return in about 3 months (around 10/12/2024). You can have the follow up  appointment with Dr. Iva or a Nurse Practicioner (our Nurse Practitioners are excellent and always have Physician oversight!).     Subjective:   Cynthia [name] is a 38 y.o. female presenting today for follow up of  Chief Complaint  Patient presents with   Asthma   Mast cell activation   Flushing   Follow-up    Cynthia [name] has a history of the following: Patient Active Problem List   Diagnosis Date Noted   Flushing 07/02/2024   Mast cell activation 07/02/2024   Facet hypertrophy 06/29/2024   Spine degeneration 06/29/2024   Megaloblastic anemia due to vitamin B12 deficiency 06/18/2024   Panic attacks 06/18/2024   Moderate episode of recurrent major depressive disorder (HCC) 06/18/2024   Psychophysiological insomnia 06/18/2024   Polyphagia 06/18/2024   Dermatitis venenata 06/01/2024   Dysautonomia (HCC) 03/16/2024   EDS (Ehlers-Danlos syndrome) 03/16/2024   Chronic bilateral thoracic back pain 03/16/2024   Chronic bilateral low back pain without sciatica 03/16/2024   Pain of both shoulder joints 01/16/2024   Thoracic spondylosis 01/05/2024   Pain in right hip 12/02/2023   Lumbar radiculopathy 12/02/2023   Joint stiffness of spine 09/13/2023   Lumbar spondylosis 08/06/2023   Mild intermittent asthma without complication 04/18/2023   Nephrolithiasis 02/13/2023   Increased heart rate 10/11/2021   Binge eating disorder 10/11/2021   Impaired fasting glucose 10/11/2021   GAD (generalized anxiety disorder) 10/11/2021  Attention deficit hyperactivity disorder (ADHD), predominantly inattentive type 09/17/2019   Primary osteoarthritis of both knees 08/25/2019   DDD (degenerative disc disease), cervical 06/03/2018   Degeneration of lumbar intervertebral disc 06/03/2018   Seasonal and perennial allergic rhinitis 05/05/2018   Seasonal allergic conjunctivitis 05/05/2018   Moderate persistent asthma, uncomplicated 05/05/2018   S/P arthroscopy of knee 04/11/2018    Prediabetes 06/11/2017   Vitamin D  deficiency 06/11/2017   Class 3 severe obesity with serious comorbidity and body mass index (BMI) of 40.0 to 44.9 in adult [facility]) 01/09/2012   Asthma     History obtained from: chart review and patient.  Discussed the use of AI scribe software for clinical note transcription with the patient and/or guardian, who gave verbal consent to proceed.  Cynthia [name] is a 38 y.o. female presenting for a follow up visit.  She was last seen in December 2025.  At that time, she had skin testing that was positive at local indoor and outdoor allergens.  She continued on Xyzal  as well as Singulair , Qnasl , and Pataday .  She also remained on Ragwitek.  Asthma was controlled with montelukast  as well as albuterol .  POTS was managed by an outside provider.  She continues to avoid all of her triggering allergens for her contact of otitis.  She had testing to look for MCAS.  Since the last visit, she has largely done well.   We did all of the urine studies for MCAS at the last visit and these were all normal.   Asthma/Respiratory Symptom History: She remains on the montelukast . She is not using albuterol  frequently at all. Cynthia [name]'s asthma has been well controlled. She has not required rescue medication, experienced nocturnal awakenings due to lower respiratory symptoms, nor have activities of daily living been limited. She has required no Emergency Department or Urgent Care visits for her asthma. She has required zero courses of systemic steroids for asthma exacerbations since the last visit. ACT score today is 25, indicating excellent asthma symptom control.   Allergic Rhinitis Symptom History: She reports a recent onset of post-nasal drip since last Wednesday or Thursday, which she can taste but does not result in productive coughing or nasal discharge. She has tried Mucinex without relief and experiences sinus headaches, which she attributes to weather changes. Her nose remains dry, and she  uses saline mist for relief. She is currently on Singulair , which she finds essential for her well-being, noting that missing doses results in feeling unwell for an extended period. She also mentions a history of using Qnasl , which was effective but caused significant nasal dryness and bleeding, leading to its discontinuation.  She had testing in December 2025 that showed reactivities to grasses, ragweed, weeds, trees, and mold mix 1.   Food Allergy  Symptom History: She had testing that was slightly reactive to peanut. Recent allergy  testing showed peanuts as a new finding, but the patient has not noticed any difference in symptoms when consuming peanut products. She regularly consumes peanut butter without noticeable reactions and has not observed changes after avoiding peanuts for a few weeks.  Skin Symptom History: She has a history of mast cell reactions and manages her symptoms with antihistamines, including Xyzal  once daily, Allegra , and Pepcid  twice daily. Despite these medications, she experiences occasional hives and rashes approximately twice a quarter, lasting a few hours and followed by fatigue and headaches. The hives, which she has experienced since childhood, are accompanied by dizziness, lightheadedness, and headaches, similar to vertigo episodes. After a hive episode, she  feels fatigued and experiences a migraine-type headache.  She has a history of back issues, exacerbated by a fall from a ladder over a year ago, which reignited previous back problems. She has been evaluated by a neurologist due to neuropathy and back issues. Her mother has a history of multiple sclerosis.  She lives with her mother and acts as her pseudo caretaker. She recently started a new job and will transition to new insurance in February. No fever, productive cough, or nasal discharge but reports sinus headaches, post-nasal drip, and occasional hives and rashes.   Otherwise, there have been no changes to her past  medical history, surgical history, family history, or social history.    Review of systems otherwise negative other than that mentioned in the HPI.    Objective:   Blood pressure 104/78, pulse 100, temperature 98 F (36.7 C), temperature source Temporal, SpO2 97%. There is no height or weight on file to calculate BMI.    Physical Exam Vitals reviewed.  Constitutional:      Appearance: Normal appearance.  HENT:     Head: Normocephalic.     Right Ear: Tympanic membrane, ear canal and external ear normal.     Left Ear: Tympanic membrane, ear canal and external ear normal.     Nose: Nose normal.     Right Turbinates: Enlarged, swollen and pale.     Left Turbinates: Enlarged, swollen and pale.     Mouth/Throat:     Mouth: Mucous membranes are moist.  Cardiovascular:     Rate and Rhythm: Normal rate and regular rhythm.  Pulmonary:     Effort: Pulmonary effort is normal.     Breath sounds: Normal breath sounds.     Comments: Moving air well in all lung fields. No increased work of breathing noted.  Abdominal:     Palpations: Abdomen is soft.  Musculoskeletal:        General: Normal range of motion.     Cervical back: Normal range of motion.  Skin:    General: Skin is warm.     Comments: Some flushing on her face, but overall this is better than the last time that I have seen her.   Neurological:     General: No focal deficit present.     Mental Status: She is alert and oriented to person, place, and time.     Gait: Gait normal.  Psychiatric:        Mood and Affect: Mood normal.        Behavior: Behavior normal. Behavior is cooperative.      Diagnostic studies: none       Marty Shaggy, MD  [facility]        "

## 2024-07-22 ENCOUNTER — Encounter: Payer: Self-pay | Admitting: Allergy & Immunology

## 2024-07-24 LAB — EHLERS-DANLOS SYNDROME PANEL

## 2024-07-30 ENCOUNTER — Ambulatory Visit: Payer: Self-pay | Admitting: Neurology

## 2024-08-01 ENCOUNTER — Telehealth: Admitting: Family Medicine

## 2024-08-01 DIAGNOSIS — J069 Acute upper respiratory infection, unspecified: Secondary | ICD-10-CM | POA: Diagnosis not present

## 2024-08-01 MED ORDER — DOXYCYCLINE HYCLATE 100 MG PO TABS: 100.0000 mg | ORAL_TABLET | Freq: Two times a day (BID) | ORAL | 0 refills | Status: AC

## 2024-08-01 NOTE — Progress Notes (Signed)
 " Virtual Visit Consent   KHAILEE MICK, you are scheduled for a virtual visit with a [facility] provider today. Just as with appointments in the office, your consent must be obtained to participate. Your consent will be active for this visit and any virtual visit you may have with one of our providers in the next 365 days. If you have a MyChart account, a copy of this consent can be sent to you electronically.  As this is a virtual visit, video technology does not allow for your provider to perform a traditional examination. This may limit your provider's ability to fully assess your condition. If your provider identifies any concerns that need to be evaluated in person or the need to arrange testing (such as labs, EKG, etc.), we will make arrangements to do so. Although advances in technology are sophisticated, we cannot ensure that it will always work on either your end or our end. If the connection with a video visit is poor, the visit may have to be switched to a telephone visit. With either a video or telephone visit, we are not always able to ensure that we have a secure connection.  By engaging in this virtual visit, you consent to the provision of healthcare and authorize for your insurance to be billed (if applicable) for the services provided during this visit. Depending on your insurance coverage, you may receive a charge related to this service.  I need to obtain your verbal consent now. Are you willing to proceed with your visit today? CHARLEY LAFRANCE has provided verbal consent on 08/01/2024 for a virtual visit (video or telephone). Roosvelt Mater, [place]  Date: 08/01/2024 12:44 PM   Virtual Visit via Video Note   I, Roosvelt Mater, connected with  TAHRA HITZEMAN  ([id number], [birth year]) on 08/01/2024 at 12:30 PM EST by a video-enabled telemedicine application and verified that I am speaking with the correct person using two identifiers.  Location: Patient: Virtual Visit Location Patient:  Home Provider: Virtual Visit Location Provider: Home Office   I discussed the limitations of evaluation and management by telemedicine and the availability of in person appointments. The patient expressed understanding and agreed to proceed.    History of Present Illness: MARQUETTA WEISKOPF is a 39 y.o. who identifies as a female who was assigned female at birth, and is being seen today for c/o waking up with no voice and unable to swallow.  Pt c/o throat burning and feeling tired and achy.  Pt c/o ears hurting as well.  Pt states generally not feeling well.  Pt states taking covid test was negative and waiting for Flu test.  Pt states people in her office have been sick but not sure if they have flu.  Pt states she had spaghetti stuck in her throat while having a hiccup but was able to cough and cough up the spaghetti. Pt states taking Mucinex, Tylenol  and tramadol for pain. Pt states she has a history of asthma.  Pt denies wheezing but feeling this morning like her chest is tight and difficulty breathing.   HPI: HPI  Problems:  Patient Active Problem List   Diagnosis Date Noted   Flushing 07/02/2024   Mast cell activation 07/02/2024   Facet hypertrophy 06/29/2024   Spine degeneration 06/29/2024   Megaloblastic anemia due to vitamin B12 deficiency 06/18/2024   Panic attacks 06/18/2024   Moderate episode of recurrent major depressive disorder (HCC) 06/18/2024   Psychophysiological insomnia 06/18/2024   Polyphagia 06/18/2024  Dermatitis venenata 06/01/2024   Dysautonomia (HCC) 03/16/2024   EDS (Ehlers-Danlos syndrome) 03/16/2024   Chronic bilateral thoracic back pain 03/16/2024   Chronic bilateral low back pain without sciatica 03/16/2024   Pain of both shoulder joints 01/16/2024   Thoracic spondylosis 01/05/2024   Pain in right hip 12/02/2023   Lumbar radiculopathy 12/02/2023   Joint stiffness of spine 09/13/2023   Lumbar spondylosis 08/06/2023   Mild intermittent asthma without  complication 04/18/2023   Nephrolithiasis 02/13/2023   Increased heart rate 10/11/2021   Binge eating disorder 10/11/2021   Impaired fasting glucose 10/11/2021   GAD (generalized anxiety disorder) 10/11/2021   Attention deficit hyperactivity disorder (ADHD), predominantly inattentive type 09/17/2019   Primary osteoarthritis of both knees 08/25/2019   DDD (degenerative disc disease), cervical 06/03/2018   Degeneration of lumbar intervertebral disc 06/03/2018   Seasonal and perennial allergic rhinitis 05/05/2018   Seasonal allergic conjunctivitis 05/05/2018   Moderate persistent asthma, uncomplicated 05/05/2018   S/P arthroscopy of knee 04/11/2018   Prediabetes 06/11/2017   Vitamin D  deficiency 06/11/2017   Class 3 severe obesity with serious comorbidity and body mass index (BMI) of 40.0 to 44.9 in adult (HCC) 01/09/2012   Asthma     Allergies: Allergies[1] Medications: Current Medications[2]  Observations/Objective: Patient is well-developed, well-nourished in no acute distress.  Resting comfortably  at home.  Head is normocephalic, atraumatic.  No labored breathing.  Speech is clear and coherent with logical content.  Patient is alert and oriented at baseline.    Assessment and Plan: 1. Upper respiratory tract infection, unspecified type (Primary) - doxycycline  (VIBRA -TABS) 100 MG tablet; Take 1 tablet (100 mg total) by mouth 2 (two) times daily for 7 days.  Dispense: 14 tablet; Refill: 0  -Will treat for possible URI with bronchitis given history of asthma  -Pt advised to follow up with in person urgent care of PCP for worsening symptoms -Pt advised to notify us  if Flu test is positive.   Follow Up Instructions: I discussed the assessment and treatment plan with the patient. The patient was provided an opportunity to ask questions and all were answered. The patient agreed with the plan and demonstrated an understanding of the instructions.  A copy of instructions were sent to  the patient via MyChart unless otherwise noted below.    The patient was advised to call back or seek an in-person evaluation if the symptoms worsen or if the condition fails to improve as anticipated.    Roosvelt Mater, PA-C    [1]  Allergies Allergen Reactions   Aquacel [Carboxymethylcellulose] Itching, Rash and Other (See Comments)    Caused blisters   Tobramycin Rash   Penicillins     Unknown reaction Has patient had a PCN reaction causing immediate rash, facial/tongue/throat swelling, SOB or lightheadedness with hypotension: Unknown Has patient had a PCN reaction causing severe rash involving mucus membranes or skin necrosis: Unknown Has patient had a PCN reaction that required hospitalization: No Has patient had a PCN reaction occurring within the last 10 years: No If all of the above answers are NO, then may proceed with Cephalosporin use.  Other reaction(s): unknown as a baby   Silicone Dermatitis    silicones   Adhesive [Tape] Rash    Prefers to use paper tape   Latex Rash   Oxycodone  Itching and Rash  [2]  Current Outpatient Medications:    doxycycline  (VIBRA -TABS) 100 MG tablet, Take 1 tablet (100 mg total) by mouth 2 (two) times daily for 7 days.,  Disp: 14 tablet, Rfl: 0   albuterol  (VENTOLIN  HFA) 108 (90 Base) MCG/ACT inhaler, Inhale 2 puffs into the lungs every 4 (four) hours as needed for wheezing or shortness of breath., Disp: 1 each, Rfl: 3   ALLERGY  RELIEF 180 MG tablet, TAKE 1 TABLET(180 MG) BY MOUTH DAILY, Disp: 90 tablet, Rfl: 1   azelastine  (ASTELIN ) 0.1 % nasal spray, Place 1 spray into both nostrils 2 (two) times daily. Use in each nostril as directed, Disp: 90 mL, Rfl: 1   BLISOVI  FE 1/20 1-20 MG-MCG tablet, Take 1 tablet by mouth daily., Disp: 84 tablet, Rfl: 4   cyanocobalamin  (VITAMIN B12) 1000 MCG tablet, Take by mouth., Disp: , Rfl:    cyclobenzaprine (FLEXERIL) 10 MG tablet, TAKE 1 TABLET BY MOUTH THREE TIMES DAILY FOR 15 DAYS AS NEEDED FOR MUSCLE  SPASMS, Disp: , Rfl:    EPINEPHrine  0.3 mg/0.3 mL IJ SOAJ injection, Inject 0.3 mg into the muscle as needed for anaphylaxis., Disp: 1 each, Rfl: 2   etodolac  (LODINE ) 400 MG tablet, Take 1 tablet (400 mg total) by mouth 2 (two) times daily., Disp: 60 tablet, Rfl: 5   famotidine  (PEPCID ) 20 MG tablet, TAKE 1 TABLET(20 MG) BY MOUTH TWICE DAILY, Disp: 180 tablet, Rfl: 0   FLUoxetine  (PROZAC ) 40 MG capsule, Take 1 capsule (40 mg total) by mouth daily., Disp: 90 capsule, Rfl: 0   gabapentin  (NEURONTIN ) 300 MG capsule, TAKE 1 CAPSULE BY MOUTH EVERY MORNING, 1 CAPSULE IN THE EVENING, AND 2 CAPSULES AT BEDTIME//Please call and make overdue appt for further refills, Disp: 120 capsule, Rfl: 0   glycopyrrolate  (ROBINUL ) 1 MG tablet, TAKE 1 TABLET BY MOUTH IN THE MORNING, AT NOON, IN THE EVENING, AND AT BEDTIME, Disp: 360 tablet, Rfl: 0   HYDROcodone -acetaminophen  (NORCO/VICODIN) 5-325 MG tablet, Take 1 tablet by mouth every 6 (six) hours as needed., Disp: , Rfl:    levocetirizine (XYZAL ) 5 MG tablet, TAKE 1 TABLET(5 MG) BY MOUTH EVERY EVENING, Disp: 90 tablet, Rfl: 1   Lisdexamfetamine  Dimesylate 50 MG CHEW, Chew 0.5 tablets by mouth 2 (two) times daily., Disp: , Rfl:    methocarbamol  (ROBAXIN ) 500 MG tablet, Take 1 tablet (500 mg total) by mouth 2 (two) times daily as needed for muscle spasms., Disp: 60 tablet, Rfl: 0   montelukast  (SINGULAIR ) 10 MG tablet, TAKE 1 TABLET(10 MG) BY MOUTH AT BEDTIME. NEED APPOINTMENT FOR ADDITIONAL REFILLS., Disp: 90 tablet, Rfl: 1   omeprazole  (PRILOSEC) 20 MG capsule, 1 capsule 30 minutes before morning meal, Disp: , Rfl:    ondansetron  (ZOFRAN  ODT) 4 MG disintegrating tablet, Take 1 tablet (4 mg total) by mouth every 8 (eight) hours as needed for nausea or vomiting., Disp: 20 tablet, Rfl: 0   predniSONE  (STERAPRED UNI-PAK 48 TAB) 5 MG (48) TBPK tablet, 12 day dosepack po (Patient taking differently: as needed. 12 day dosepack po), Disp: 48 tablet, Rfl: 0   RAGWITEK 12 AMB A  1-U SUBL, DISSOLVE 1 TABLET UNDER THE TONGUE DAILY. BRING THIS PERSON TO YOUR APPOINTMENT AND YOU WILL TAKE THE 1ST TABLET IN THE CLINIC, Disp: 30 tablet, Rfl: 5   SYRINGE-NEEDLE, DISP, 3 ML (EASYPOINT NEEDLE/SYRINGE) 25G X 5/8 3 ML MISC, Use one needle/syringe to inject Vitamin B12 into the muscle once every 7 days. *Disp. whatever insurance will cover*, Disp: , Rfl:    tirzepatide  (ZEPBOUND ) 15 MG/0.5ML Pen, Inject 15 mg into the skin once a week., Disp: 2 mL, Rfl: 0   traMADol (ULTRAM) 50 MG tablet,  TAKE 1 TABLET BY MOUTH THREE TIMES DAILY FOR 5 DAYS AS NEEDED, Disp: , Rfl:    traZODone (DESYREL) 100 MG tablet, Take 100 mg by mouth at bedtime., Disp: , Rfl:    Vitamin D , Ergocalciferol , (DRISDOL ) 1.25 MG (50000 UNIT) CAPS capsule, Take 1 capsule (50,000 Units total) by mouth every 7 (seven) days., Disp: 4 capsule, Rfl: 0   XIIDRA 5 % SOLN, , Disp: , Rfl:   "

## 2024-08-01 NOTE — Patient Instructions (Signed)
 " Cynthia [name], thank you for joining Roosvelt Mater, PA-C for today's virtual visit.  While this provider is not your primary care provider (PCP), if your PCP is located in our provider database this encounter information will be shared with them immediately following your visit.   A [facility] MyChart account gives you access to today's visit and all your visits, tests, and labs performed at [facility]  click here if you don't have a [facility] MyChart account or go to [email or web address]  Consent: (Patient) Cynthia [name] provided verbal consent for this virtual visit at the beginning of the encounter.  Current Medications:  Current Outpatient Medications:    doxycycline  (VIBRA -TABS) 100 MG tablet, Take 1 tablet (100 mg total) by mouth 2 (two) times daily for 7 days., Disp: 14 tablet, Rfl: 0   albuterol  (VENTOLIN  HFA) 108 (90 Base) MCG/ACT inhaler, Inhale 2 puffs into the lungs every 4 (four) hours as needed for wheezing or shortness of breath., Disp: 1 each, Rfl: 3   ALLERGY  RELIEF 180 MG tablet, TAKE 1 TABLET(180 MG) BY MOUTH DAILY, Disp: 90 tablet, Rfl: 1   azelastine  (ASTELIN ) 0.1 % nasal spray, Place 1 spray into both nostrils 2 (two) times daily. Use in each nostril as directed, Disp: 90 mL, Rfl: 1   BLISOVI  FE 1/20 1-20 MG-MCG tablet, Take 1 tablet by mouth daily., Disp: 84 tablet, Rfl: 4   cyanocobalamin  (VITAMIN B12) 1000 MCG tablet, Take by mouth., Disp: , Rfl:    cyclobenzaprine (FLEXERIL) 10 MG tablet, TAKE 1 TABLET BY MOUTH THREE TIMES DAILY FOR 15 DAYS AS NEEDED FOR MUSCLE SPASMS, Disp: , Rfl:    EPINEPHrine  0.3 mg/0.3 mL IJ SOAJ injection, Inject 0.3 mg into the muscle as needed for anaphylaxis., Disp: 1 each, Rfl: 2   etodolac  (LODINE ) 400 MG tablet, Take 1 tablet (400 mg total) by mouth 2 (two) times daily., Disp: 60 tablet, Rfl: 5   famotidine  (PEPCID ) 20 MG tablet, TAKE 1 TABLET(20 MG) BY MOUTH TWICE DAILY, Disp: 180 tablet, Rfl: 0   FLUoxetine   (PROZAC ) 40 MG capsule, Take 1 capsule (40 mg total) by mouth daily., Disp: 90 capsule, Rfl: 0   gabapentin  (NEURONTIN ) 300 MG capsule, TAKE 1 CAPSULE BY MOUTH EVERY MORNING, 1 CAPSULE IN THE EVENING, AND 2 CAPSULES AT BEDTIME//Please call and make overdue appt for further refills, Disp: 120 capsule, Rfl: 0   glycopyrrolate  (ROBINUL ) 1 MG tablet, TAKE 1 TABLET BY MOUTH IN THE MORNING, AT NOON, IN THE EVENING, AND AT BEDTIME, Disp: 360 tablet, Rfl: 0   HYDROcodone -acetaminophen  (NORCO/VICODIN) 5-325 MG tablet, Take 1 tablet by mouth every 6 (six) hours as needed., Disp: , Rfl:    levocetirizine (XYZAL ) 5 MG tablet, TAKE 1 TABLET(5 MG) BY MOUTH EVERY EVENING, Disp: 90 tablet, Rfl: 1   Lisdexamfetamine  Dimesylate 50 MG CHEW, Chew 0.5 tablets by mouth 2 (two) times daily., Disp: , Rfl:    methocarbamol  (ROBAXIN ) 500 MG tablet, Take 1 tablet (500 mg total) by mouth 2 (two) times daily as needed for muscle spasms., Disp: 60 tablet, Rfl: 0   montelukast  (SINGULAIR ) 10 MG tablet, TAKE 1 TABLET(10 MG) BY MOUTH AT BEDTIME. NEED APPOINTMENT FOR ADDITIONAL REFILLS., Disp: 90 tablet, Rfl: 1   omeprazole  (PRILOSEC) 20 MG capsule, 1 capsule 30 minutes before morning meal, Disp: , Rfl:    ondansetron  (ZOFRAN  ODT) 4 MG disintegrating tablet, Take 1 tablet (4 mg total) by mouth every 8 (eight) hours as needed for nausea or vomiting., Disp:  20 tablet, Rfl: 0   predniSONE  (STERAPRED UNI-PAK 48 TAB) 5 MG (48) TBPK tablet, 12 day dosepack po (Patient taking differently: as needed. 12 day dosepack po), Disp: 48 tablet, Rfl: 0   RAGWITEK 12 AMB A 1-U SUBL, DISSOLVE 1 TABLET UNDER THE TONGUE DAILY. BRING THIS PERSON TO YOUR APPOINTMENT AND YOU WILL TAKE THE 1ST TABLET IN THE CLINIC, Disp: 30 tablet, Rfl: 5   SYRINGE-NEEDLE, DISP, 3 ML (EASYPOINT NEEDLE/SYRINGE) 25G X 5/8 3 ML MISC, Use one needle/syringe to inject Vitamin B12 into the muscle once every 7 days. *Disp. whatever insurance will cover*, Disp: , Rfl:    tirzepatide   (ZEPBOUND ) 15 MG/0.5ML Pen, Inject 15 mg into the skin once a week., Disp: 2 mL, Rfl: 0   traMADol (ULTRAM) 50 MG tablet, TAKE 1 TABLET BY MOUTH THREE TIMES DAILY FOR 5 DAYS AS NEEDED, Disp: , Rfl:    traZODone (DESYREL) 100 MG tablet, Take 100 mg by mouth at bedtime., Disp: , Rfl:    Vitamin D , Ergocalciferol , (DRISDOL ) 1.25 MG (50000 UNIT) CAPS capsule, Take 1 capsule (50,000 Units total) by mouth every 7 (seven) days., Disp: 4 capsule, Rfl: 0   XIIDRA 5 % SOLN, , Disp: , Rfl:    Medications ordered in this encounter:  Meds ordered this encounter  Medications   doxycycline  (VIBRA -TABS) 100 MG tablet    Sig: Take 1 tablet (100 mg total) by mouth 2 (two) times daily for 7 days.    Dispense:  14 tablet    Refill:  0     *If you need refills on other medications prior to your next appointment, please contact your pharmacy*  Follow-Up: Call back or seek an in-person evaluation if the symptoms worsen or if the condition fails to improve as anticipated.  [facility] [phone]  Other Instructions Upper Respiratory Infection, Adult An upper respiratory infection (URI) is a common viral infection of the nose, throat, and upper air passages that lead to the lungs. The most common type of URI is the common cold. URIs usually get better on their own, without medical treatment. What are the causes? A URI is caused by a virus. You may catch a virus by: Breathing in droplets from an infected person's cough or sneeze. Touching something that has been exposed to the virus (is contaminated) and then touching your mouth, nose, or eyes. What increases the risk? You are more likely to get a URI if: You are very young or very old. You have close contact with others, such as at work, school, or a health care facility. You smoke. You have long-term (chronic) heart or lung disease. You have a weakened disease-fighting system (immune system). You have nasal allergies or asthma. You are  experiencing a lot of stress. You have poor nutrition. What are the signs or symptoms? A URI usually involves some of the following symptoms: Runny or stuffy (congested) nose. Cough. Sneezing. Sore throat. Headache. Fatigue. Fever. Loss of appetite. Pain in your forehead, behind your eyes, and over your cheekbones (sinus pain). Muscle aches. Redness or irritation of the eyes. Pressure in the ears or face. How is this diagnosed? This condition may be diagnosed based on your medical history and symptoms, and a physical exam. Your health care provider may use a swab to take a mucus sample from your nose (nasal swab). This sample can be tested to determine what virus is causing the illness. How is this treated? URIs usually get better on their own  within 7-10 days. Medicines cannot cure URIs, but your health care provider may recommend certain medicines to help relieve symptoms, such as: Over-the-counter cold medicines. Cough suppressants. Coughing is a type of defense against infection that helps to clear the respiratory system, so take these medicines only as recommended by your health care provider. Fever-reducing medicines. Follow these instructions at home: Activity Rest as needed. If you have a fever, stay home from work or school until your fever is gone or until your health care provider says your URI cannot spread to other people (is no longer contagious). Your health care provider may have you wear a face mask to prevent your infection from spreading. Relieving symptoms Gargle with a mixture of salt and water 3-4 times a day or as needed. To make salt water, completely dissolve -1 tsp (3-6 g) of salt in 1 cup (237 mL) of warm water. Use a cool-mist humidifier to add moisture to the air. This can help you breathe more easily. Eating and drinking  Drink enough fluid to keep your urine pale yellow. Eat soups and other clear broths. General instructions  Take over-the-counter  and prescription medicines only as told by your health care provider. These include cold medicines, fever reducers, and cough suppressants. Do not use any products that contain nicotine or tobacco. These products include cigarettes, chewing tobacco, and vaping devices, such as e-cigarettes. If you need help quitting, ask your health care provider. Stay away from secondhand smoke. Stay up to date on all immunizations, including the yearly (annual) flu vaccine. Keep all follow-up visits. This is important. How to prevent the spread of infection to others URIs can be contagious. To prevent the infection from spreading: Wash your hands with soap and water for at least 20 seconds. If soap and water are not available, use hand sanitizer. Avoid touching your mouth, face, eyes, or nose. Cough or sneeze into a tissue or your sleeve or elbow instead of into your hand or into the air.  Contact a health care provider if: You are getting worse instead of better. You have a fever or chills. Your mucus is brown or red. You have yellow or brown discharge coming from your nose. You have pain in your face, especially when you bend forward. You have swollen neck glands. You have pain while swallowing. You have white areas in the back of your throat. Get help right away if: You have shortness of breath that gets worse. You have severe or persistent: Headache. Ear pain. Sinus pain. Chest pain. You have chronic lung disease along with any of the following: Making high-pitched whistling sounds when you breathe, most often when you breathe out (wheezing). Prolonged cough (more than 14 days). Coughing up blood. A change in your usual mucus. You have a stiff neck. You have changes in your: Vision. Hearing. Thinking. Mood. These symptoms may be an emergency. Get help right away. Call 911. Do not wait to see if the symptoms will go away. Do not drive yourself to the hospital. Summary An upper  respiratory infection (URI) is a common infection of the nose, throat, and upper air passages that lead to the lungs. A URI is caused by a virus. URIs usually get better on their own within 7-10 days. Medicines cannot cure URIs, but your health care provider may recommend certain medicines to help relieve symptoms. This information is not intended to replace advice given to you by your health care provider. Make sure you discuss any questions you have with  your health care provider. Document Revised: 02/08/2021 Document Reviewed: 02/08/2021 Elsevier Patient Education  2024 [employer]   If you have been instructed to have an in-person evaluation today at a local Urgent Care facility, please use the link below. It will take you to a list of all of our available [facility], including address, phone number and hours of operation. Please do not delay care.  [facility]  If you or a family member do not have a primary care provider, use the link below to schedule a visit and establish care. When you choose a [facility] primary care physician or advanced practice provider, you gain a long-term partner in health. Find a Primary Care Provider  Learn more about [facility]'s in-office and virtual care options: [facility] - Get Care Now  "

## 2024-08-03 ENCOUNTER — Other Ambulatory Visit: Payer: Self-pay

## 2024-08-03 ENCOUNTER — Ambulatory Visit: Admitting: Family Medicine

## 2024-08-03 ENCOUNTER — Encounter: Payer: Self-pay | Admitting: Family Medicine

## 2024-08-03 ENCOUNTER — Encounter: Payer: Self-pay | Admitting: Physician Assistant

## 2024-08-03 ENCOUNTER — Ambulatory Visit: Admitting: Physician Assistant

## 2024-08-03 VITALS — BP 139/76 | HR 120

## 2024-08-03 VITALS — BP 118/80 | HR 114 | Temp 98.0°F

## 2024-08-03 DIAGNOSIS — L74519 Primary focal hyperhidrosis, unspecified: Secondary | ICD-10-CM | POA: Diagnosis not present

## 2024-08-03 DIAGNOSIS — L2389 Allergic contact dermatitis due to other agents: Secondary | ICD-10-CM

## 2024-08-03 DIAGNOSIS — J4520 Mild intermittent asthma, uncomplicated: Secondary | ICD-10-CM

## 2024-08-03 DIAGNOSIS — J302 Other seasonal allergic rhinitis: Secondary | ICD-10-CM | POA: Diagnosis not present

## 2024-08-03 DIAGNOSIS — L304 Erythema intertrigo: Secondary | ICD-10-CM

## 2024-08-03 DIAGNOSIS — K219 Gastro-esophageal reflux disease without esophagitis: Secondary | ICD-10-CM | POA: Insufficient documentation

## 2024-08-03 DIAGNOSIS — J3089 Other allergic rhinitis: Secondary | ICD-10-CM | POA: Diagnosis not present

## 2024-08-03 MED ORDER — GLYCOPYRROLATE 1 MG PO TABS: 1.0000 mg | ORAL_TABLET | Freq: Two times a day (BID) | ORAL | 4 refills | Status: AC

## 2024-08-03 MED ORDER — GLYCOPYRROLATE 1 MG PO TABS: 1.0000 mg | ORAL_TABLET | Freq: Two times a day (BID) | ORAL | 4 refills | Status: DC

## 2024-08-03 MED ORDER — NYSTATIN 100000 UNIT/GM EX CREA: 1.0000 | TOPICAL_CREAM | CUTANEOUS | 5 refills | Status: AC

## 2024-08-03 MED ORDER — TRIAMCINOLONE ACETONIDE 0.1 % EX CREA: 1.0000 | TOPICAL_CREAM | Freq: Every day | CUTANEOUS | 1 refills | Status: AC | PRN

## 2024-08-03 MED ORDER — OMEPRAZOLE 20 MG PO CPDR: 20.0000 mg | DELAYED_RELEASE_CAPSULE | Freq: Every day | ORAL | 0 refills | Status: AC

## 2024-08-03 NOTE — Progress Notes (Signed)
"  ° °  Follow-Up Visit   Subjective  Cynthia [name] is a 39 y.o. female ESTABLISHED PATIENT who presents for the following: Hyperhidrosis follow up - She is taking glycopyrrolate  1 mg 3 tablets in the morning and 1 tablet at night. It does not seem to be working as well and she is becoming more concerned about dehydration.  Also wishes to discuss rashes that she gets under her breasts and abdomen - worse in Summer months. She uses a powder that her PCP prescribed.    The following portions of the chart were reviewed this encounter and updated as appropriate: medications, allergies, medical history  Review of Systems:  No other skin or systemic complaints except as noted in HPI or Assessment and Plan.  Objective  Well appearing patient in no apparent distress; mood and affect are within normal limits.   A focused examination was performed of the following areas: no exam performed    Relevant exam findings are noted in the Assessment and Plan.    Assessment & Plan   HYPERHIDROSIS - well controlled  Exam: no sweating   Treatment Plan: Continue glycopyrrolate  1 mg 3 tablets in the morning and 1 tablet at night. May try to taper dose down during the winter and see if the dehydration improves.   INTERTRIGO - breast and infra- abdomen  Exam: clear today    Intertrigo is a chronic recurrent rash that occurs in skin fold areas that may be associated with friction; heat; moisture; yeast; fungus; and bacteria.  It is exacerbated by increased movement / activity; sweating; and higher atmospheric temperature.  Use of an absorbant powder such as Zeasorb AF powder or other OTC antifungal powder to the area daily can prevent rash recurrence. Other options to help keep the area dry include blow drying the area after bathing or using antiperspirant products such as Duradry sweat minimizing gel.  Treatment Plan: Nystatin  cream - Mix equally with TMC 0.1% cream  Apply twice daily to affected areas  as needed.    PRIMARY FOCAL HYPERHIDROSIS   Existing Treatments - glycopyrrolate  (ROBINUL ) 1 MG tablet - TAKE 1 TABLET BY MOUTH IN THE MORNING, AT NOON, IN THE EVENING, AND AT BEDTIME ERYTHEMA INTERTRIGO    Return in about 1 year (around 08/03/2025) for hyperhidrosis follow up.  I, Roseline Hutchinson, CMA, am acting as scribe for Ryenne Lynam K, PA-C .   Documentation: I have reviewed the above documentation for accuracy and completeness, and I agree with the above.  Kendan Cornforth K, PA-C    "

## 2024-08-03 NOTE — Patient Instructions (Signed)

## 2024-08-03 NOTE — Patient Instructions (Addendum)
 Asthma                                                                                                                                                                                                                                                                                                                                                                                                                                                                                                                                                                                                                                                                                                                                                                                                                                                                                                                                                                                                                                                                                                                                                                                                                                                                                                                                                                                                                                                                                                                                                                                                                                                                                                                                                                                                                                                                                                                                                                                                                                                                                                                                                                                                                                                                                                                                                                                                                                                                                                                                                                                                                                                                                                                                                                                                                                                                                                                                                                                                                                                                                                                                                                                                                                                                                                                                                                                                                                                                                                                                                                                                                                                                                                                                                                                                                                                                                                                                                                                                                                                                                           -  Continue montelukast  10 mg once a day to prevent cough or wheeze - Continue albuterol  2 puffs once every 4 hours as needed for cough or wheeze - Continue albuterol  2 puffs 5 to 15 minutes before activity to decrease cough or wheeze - Your lung testing was normal today  Aspiration Get a chest xray. We will call you with the result when it becomes avaialbe   Cough Tessalon Perles 200 mg three times a day for cough  Seasonal and perennial allergic rhinitis (grasses, weeds, trees, ragweed, indoor/outdoor molds)  -Continue Ragwitek 1 sublingual tablet once a day. - Continue Xyzal  5 mg once daily.  - Continue with Singulair  10mg  daily.  - We will add on the Astelin  up to two sprays per nostril twice daily to help with postnasal drip (this will be covered better than Qnasl ).  - Continue with Pataday  one drop in each eye once a day as needed for red, itchy eyes OR Zaditor one drop in each eye twice a day as needed for red itchy eyes.  Reflux Continue dietary and lifestyle modifications as listed below Begin omeprazole  20 mg once a day for the next 2-4 weeks, then stop Continue famotidine  20 mg twice a day  POTS - Continue with the current workup and management.   Rash with contact dermatitis  - Continue to avoid balsam of [place], cocoa phoneme, hydroperoxide of linalool, fragrance mix 1, textile dye 2, nickel, and propilis. - Flushing today (getting tryptase today). - Check your spam folder for the Safe List, but call us  if you do not find it.   Concern for MCAS - Previous testing was all negative for MCAS. - Tryptase order printed off for you. - We can consider doing Rhapsido in the future to prevent any outbreaks. - This is recently approved to treat chronic hives and has been helpful for those who did not respond to Xolair. - The nice thing about it is that if you DO react to it, it will be out of your body more quickly than Xolair.  Call the  clinic if this treatment plan is not working well for you  Follow up in 1 month or sooner if needed.   Lifestyle Changes for Controlling GERD When you have GERD, stomach acid feels as if its backing up toward your mouth. Whether or not you take medication to control your GERD, your symptoms can often be improved with lifestyle changes.   Raise Your Head Reflux is more likely to strike when youre lying down flat, because stomach fluid can flow backward more easily. Raising the head of your bed 4-6 inches can help. To do this: Slide blocks or books under the legs at the head of your bed. Or, place a wedge under the mattress. Many foam stores can make a suitable wedge for you. The wedge should run from your waist to the top of your head. Dont just prop your head on several pillows. This increases pressure on your stomach. It can make GERD worse.  Watch Your Eating Habits Certain foods may increase the acid in your stomach or relax the lower esophageal sphincter, making GERD more likely. Its best to avoid the following: Coffee, tea, and carbonated drinks (with and without caffeine) Fatty, fried, or spicy food Mint, chocolate, onions, and tomatoes Any other foods that seem to irritate your stomach or cause you pain  Relieve the Pressure Eat smaller meals, even if you have to eat more often. Dont lie down right after you eat. Wait  a few hours for your stomach to empty. Avoid tight belts and tight-fitting clothes. Lose excess weight.  Tobacco and Alcohol Avoid smoking tobacco and drinking alcohol. They can make GERD symptoms worse.

## 2024-08-03 NOTE — Progress Notes (Signed)
 "  [address] [place] [address] Dept: [phone]  FOLLOW UP NOTE  Patient ID: Cynthia [name], female    DOB: Dec 26, 1985  Age: 39 y.o. MRN: [id number] Date of Office Visit: 08/03/2024  Assessment  Chief Complaint: Wheezing, Pain, and Follow-up (Pt comes in to have lungs check due to choking on spagetti and was told that she might have aspirated and opened her up. Pt states that she has tested for covid and flu but all have came back negative. She is currently experiencing pain in her throat and have a hard time breathing  )  HPI Cynthia [name] is a 39 year old female who presents to the clinic for an acute visit.  She was last seen in this clinic on 07/14/2024 by Dr. Iva for evaluation of mast cell activation, mild intermittent asthma, allergic rhinitis, POTS, and rash with contact dermatitis suspected.  Patch testing positive tobalsam of [place], cocoa phoneme, hydroperoxide of linalool, fragrance mix 1, textile dye 2, nickel, and propilis on 06/09/2024.  Discussed the use of AI scribe software for clinical note transcription with the patient, who gave verbal consent to proceed.  History of Present Illness Cynthia [name] is a 39 year old female who presents with throat pain and difficulty swallowing following a choking incident.  On Friday night, while eating spaghetti, she experienced a hiccup that caused the food to go down the wrong pipe. She was able to breathe but started coughing hard and eventually managed to cough up the food. Initially, she felt the food was stuck in her throat.  She reports that she has not experienced food lodging in her throat previously.  She woke up at 3 AM on Saturday with extreme throat pain, fullness, and pressure in her ears, and difficulty swallowing. Attempts to drink water resulted in spitting it out due to the pain. No breathing difficulties were noted.  On Saturday, she had a virtual appointment and was prescribed doxycycline  100 mg twice  a day which she started immediately. She also took Mucinex, tramadol for back pain, Luden's cough drops, Advil, and Tylenol . She reports significant throat pain and difficulty swallowing over the weekend.  She tested negative for COVID-19 and flu on Saturday morning. She experienced a low-grade fever with temperatures around 99.49F, though her thermometer is unreliable. She felt exhausted, had a hoarse voice, and experienced chest tightness and a weird throat sensation.  At today's visit, she reports some shortness of breath with activity, occasional wheeze, and dry cough.  Her cough is described as dry and painful, with a 'raw' taste but no blood. She has not used albuterol  but continues montelukast .  She denies chest pain or pain with breathing.  She does report significant deepening of her voice and continues to experience pain in her throat.  She is able to swallow at this time.  She reports occasional symptoms of reflux including heartburn and generally takes Gaviscon with relief of symptoms.  She continues famotidine  twice a day for symptoms related to Ehlers-Danlos.  Her current medications are listed in the chart.   Drug Allergies:  Allergies[1]  Physical Exam: BP 118/80 (BP Location: Right Arm, Patient Position: Sitting, Cuff Size: Large)   Pulse (!) 114   Temp 98 F (36.7 C)   SpO2 98%    Physical Exam Vitals reviewed.  Constitutional:      Appearance: Normal appearance.  HENT:     Head: Normocephalic and atraumatic.     Right Ear: Tympanic membrane normal.  Left Ear: Tympanic membrane normal.     Nose:     Comments: Bilateral nars slightly erythematous with thin clear nasal drainage noted.  Pharynx erythematous with no exudate.  No oropharyngeal swelling visible.  Ears normal.  Lungs normal. Eyes:     Conjunctiva/sclera: Conjunctivae normal.  Cardiovascular:     Rate and Rhythm: Normal rate and regular rhythm.     Heart sounds: Normal heart sounds. No murmur  heard. Pulmonary:     Effort: Pulmonary effort is normal.     Breath sounds: Normal breath sounds.     Comments: Lungs clear to auscultation Musculoskeletal:        General: Normal range of motion.     Cervical back: Normal range of motion and neck supple.  Skin:    General: Skin is warm and dry.  Neurological:     Mental Status: She is alert and oriented to person, place, and time.  Psychiatric:        Mood and Affect: Mood normal.        Behavior: Behavior normal.        Thought Content: Thought content normal.        Judgment: Judgment normal.     Diagnostics: FVC 3.00 which is 86% of predicted value, FEV1 2.50 which is 87% of predicted value.  Spirometry indicates normal ventilatory function.  Assessment and Plan: 1. Mild intermittent asthma without complication   2. Seasonal and perennial allergic rhinitis   3. Allergic contact dermatitis due to other agents   4. Other neonatal aspiration with respiratory symptoms   5. Gastroesophageal reflux disease, unspecified whether esophagitis present     Meds ordered this encounter  Medications   omeprazole  (PRILOSEC) 20 MG capsule    Sig: Take 1 capsule (20 mg total) by mouth daily.    Dispense:  30 capsule    Refill:  0    Patient Instructions  Asthma                                                                                                                                                                                                                                                                                                                                                                                                                                                                                                                                                                                                                                                                                                                                                                                                                                                                                                                                                                                                                                                                                                                                                                                                                                                                                                                                                                                                                                                                                                                                                                                                                                                                                                                                                                                                                                                                                                                                                                                                                                                                                                                                                                                                                                                                                                                                                                                                                                                                                                                                                                                                                                                                                                                                                                                                                                                                                                                                                                                                                                                                                                                                                                                                                                                                                                                                                                                                                                                                                                                                                                                                                                                                                                                                                                                                                                                                                                                                                                                                                                                                                           -  Continue montelukast  10 mg once a day to prevent cough or wheeze - Continue albuterol  2 puffs once every 4 hours as needed for cough or wheeze - Continue albuterol  2 puffs 5 to 15 minutes before activity to decrease cough or wheeze - Your lung testing was normal today  Aspiration Get a chest xray. We will call you with the result when it becomes avaialbe   Cough Tessalon Perles 200 mg three times a day for cough  Seasonal and perennial allergic rhinitis (grasses, weeds, trees, ragweed, indoor/outdoor molds)  -Continue Ragwitek 1 sublingual tablet once a day. - Continue Xyzal  5 mg once daily.  - Continue with Singulair  10mg  daily.  - We  will add on the Astelin  up to two sprays per nostril twice daily to help with postnasal drip (this will be covered better than Qnasl ).  - Continue with Pataday  one drop in each eye once a day as needed for red, itchy eyes OR Zaditor one drop in each eye twice a day as needed for red itchy eyes.  Reflux Continue dietary and lifestyle modifications as listed below Begin omeprazole  20 mg once a day for the next 2-4 weeks, then stop Continue famotidine  20 mg twice a day  POTS - Continue with the current workup and management.   Rash with contact dermatitis  - Continue to avoid balsam of [place], cocoa phoneme, hydroperoxide of linalool, fragrance mix 1, textile dye 2, nickel, and propilis. - Flushing today (getting tryptase today). - Check your spam folder for the Safe List, but call us  if you do not find it.   Concern for MCAS - Previous testing was all negative for MCAS. - Tryptase order printed off for you. - We can consider doing Rhapsido in the future to prevent any outbreaks. - This is recently approved to treat chronic hives and has been helpful for those who did not respond to Xolair. - The nice thing about it is that if you DO react to it, it will be out of your body more quickly than Xolair.  Call the clinic if this treatment plan is not working well for you  Follow up in 1 month or sooner if needed.   Return in about 4 weeks (around 08/31/2024), or if symptoms worsen or fail to improve.    Thank you for the opportunity to care for this patient.  Please do not hesitate to contact me with questions.  Arlean Mutter, FNP [facility]          [1]  Allergies Allergen Reactions   Aquacel [Carboxymethylcellulose] Itching, Rash and Other (See Comments)    Caused blisters   Tobramycin Rash   Penicillins     Unknown reaction Has patient had a PCN reaction causing immediate rash, facial/tongue/throat swelling, SOB or lightheadedness with hypotension:  Unknown Has patient had a PCN reaction causing severe rash involving mucus membranes or skin necrosis: Unknown Has patient had a PCN reaction that required hospitalization: No Has patient had a PCN reaction occurring within the last 10 years: No If all of the above answers are NO, then may proceed with Cephalosporin use.  Other reaction(s): unknown as a baby   Silicone Dermatitis    silicones   Adhesive [Tape] Rash    Prefers to use paper tape   Latex Rash   Oxycodone  Itching and Rash   "

## 2024-08-04 ENCOUNTER — Ambulatory Visit: Payer: Self-pay | Admitting: Family Medicine

## 2024-08-04 ENCOUNTER — Ambulatory Visit
Admission: RE | Admit: 2024-08-04 | Discharge: 2024-08-04 | Disposition: A | Source: Ambulatory Visit | Attending: Family Medicine | Admitting: Family Medicine

## 2024-08-04 ENCOUNTER — Other Ambulatory Visit: Payer: Self-pay | Admitting: Family Medicine

## 2024-08-04 NOTE — Progress Notes (Signed)
 Can you please have her use some cepacol cough drops or any cough drop with benzocaine to help with cough. How is her throat today?

## 2024-08-04 NOTE — Progress Notes (Signed)
 Can you please let this patient know that her chest xray is within normal limits with no pneumonia identified. Thank you

## 2024-08-04 NOTE — Addendum Note (Signed)
 Addended by: NANCEE JON SAILOR on: 08/04/2024 12:03 PM   Modules accepted: Orders

## 2024-08-05 MED ORDER — PREDNISONE 10 MG PO TABS: ORAL_TABLET | ORAL | 0 refills | Status: AC

## 2024-08-07 ENCOUNTER — Other Ambulatory Visit: Payer: Self-pay | Admitting: *Deleted

## 2024-08-07 DIAGNOSIS — I890 Lymphedema, not elsewhere classified: Secondary | ICD-10-CM

## 2024-08-13 ENCOUNTER — Other Ambulatory Visit (HOSPITAL_BASED_OUTPATIENT_CLINIC_OR_DEPARTMENT_OTHER): Payer: Self-pay

## 2024-08-17 ENCOUNTER — Other Ambulatory Visit (HOSPITAL_BASED_OUTPATIENT_CLINIC_OR_DEPARTMENT_OTHER): Payer: Self-pay

## 2024-08-17 MED ORDER — ZEPBOUND 15 MG/0.5ML ~~LOC~~ SOAJ
15.0000 mg | SUBCUTANEOUS | 0 refills | Status: AC
  Filled 2024-08-17: qty 2, 28d supply, fill #0

## 2024-08-19 ENCOUNTER — Other Ambulatory Visit (HOSPITAL_BASED_OUTPATIENT_CLINIC_OR_DEPARTMENT_OTHER): Payer: Self-pay

## 2024-08-21 ENCOUNTER — Other Ambulatory Visit: Payer: Self-pay

## 2024-08-27 ENCOUNTER — Other Ambulatory Visit: Payer: Self-pay | Admitting: Family Medicine

## 2024-08-27 DIAGNOSIS — F411 Generalized anxiety disorder: Secondary | ICD-10-CM

## 2024-08-27 NOTE — Progress Notes (Unsigned)
 "   Requested by:  Katina Pfeiffer, PA-C [id number] [place],  [place] [id number]  Reason for consultation: Acquired lymphedema    History of Present Illness   Cynthia [name] is a 39 y.o. ([birth year]) female who presents for evaluation of ***  Venous symptoms include: (aching, heavy, tired, throbbing, burning, itching, swelling, bleeding, ulcer)  *** Onset/duration:  ***  Occupation:  *** Aggravating factors: (sitting, standing) Alleviating factors: (elevation) Compression:  *** Helps:  *** Pain medications:  *** Previous vein procedures:  *** History of DVT:  ***  Past Medical History:  Diagnosis Date   ADHD (attention deficit hyperactivity disorder)    Allergic rhinitis    Anxiety    Asthma    Back injury    COMPETITIVE INJURY IN HIGH SCHOOL   Back pain    Depression    Ehlers-Danlos syndrome    Fatty liver    Fibromyalgia    Gallbladder problem    GERD (gastroesophageal reflux disease)    IBS (irritable bowel syndrome)    Insulin  resistance    Joint pain    Kidney stones    Migraines    no aura   OA (osteoarthritis) of knee    Obesity    Pneumonia    PONV (postoperative nausea and vomiting)    Seasonal allergies    Vitamin D  deficiency     Past Surgical History:  Procedure Laterality Date   CHOLECYSTECTOMY     DG DILATION URETERS     KNEE ARTHROSCOPY Left    KNEE ARTHROSCOPY WITH LATERAL MENISECTOMY Right 04/11/2018   Procedure: RIGHT KNEE ARTHROSCOPY WITH PARTIAL MENISECTOMY CHONDROPLASTY;  Surgeon: Gerome Charleston, MD;  Location: [facility];  Service: Orthopedics;  Laterality: Right;   MOUTH SURGERY     SHOULDER SURGERY  2002   RIGHT   WISDOM TOOTH EXTRACTION     WRIST ARTHROSCOPY     Right    Social History   Socioeconomic History   Marital status: Single    Spouse name: Not on file   Number of children: 0   Years of education: some college   Highest education level: Some college, no degree  Occupational History   Occupation: not  working d/t knees  Tobacco Use   Smoking status: Never   Smokeless tobacco: Never  Vaping Use   Vaping status: Never Used  Substance and Sexual Activity   Alcohol use: Yes    Comment: socially   Drug use: No   Sexual activity: Not Currently    Birth control/protection: Pill  Other Topics Concern   Not on file  Social History Narrative   Lives with parents   Caffeine use: 2-3 times per week   Right handed   Social Drivers of Health   Tobacco Use: Low Risk (08/03/2024)   Patient History    Smoking Tobacco Use: Never    Smokeless Tobacco Use: Never    Passive Exposure: Not on file  Financial Resource Strain: High Risk (06/30/2024)   Overall Financial Resource Strain (CARDIA)    Difficulty of Paying Living Expenses: Very hard  Food Insecurity: Patient Declined (06/30/2024)   Epic    Worried About Programme Researcher, Broadcasting/film/video in the Last Year: Patient declined    Barista in the Last Year: Patient declined  Transportation Needs: No Transportation Needs (06/30/2024)   Epic    Lack of Transportation (Medical): No    Lack of Transportation (Non-Medical): No  Physical Activity: Insufficiently Active (06/30/2024)  Exercise Vital Sign    Days of Exercise per Week: 2 days    Minutes of Exercise per Session: 20 min  Stress: Stress Concern Present (06/30/2024)   [employer] of Occupational Health - Occupational Stress Questionnaire    Feeling of Stress: Very much  Social Connections: Unknown (06/30/2024)   Social Connection and Isolation Panel    Frequency of Communication with Friends and Family: Three times a week    Frequency of Social Gatherings with Friends and Family: Once a week    Attends Religious Services: 1 to 4 times per year    Active Member of [employer] or Organizations: No    Attends Engineer, Structural: Not on file    Marital Status: Patient declined  Intimate Partner Violence: Unknown (05/15/2022)   Received from [facility]   HITS    Physically Hurt:  Not on file    Insult or Talk Down To: Not on file    Threaten Physical Harm: Not on file    Scream or Curse: Not on file  Depression (PHQ2-9): High Risk (06/11/2024)   Depression (PHQ2-9)    PHQ-2 Score: 13  Alcohol Screen: Low Risk (06/30/2024)   Alcohol Screen    Last Alcohol Screening Score (AUDIT): 2  Housing: Unknown (06/30/2024)   Epic    Unable to Pay for Housing in the Last Year: Patient declined    Number of Times Moved in the Last Year: 0    Homeless in the Last Year: Patient declined  Utilities: Not on file  Health Literacy: Not on file   *** Family History  Problem Relation Age of Onset   Multiple sclerosis Mother    Diabetes Mother    Hyperlipidemia Mother    Thyroid disease Mother    Depression Mother    Anxiety disorder Mother    Obesity Mother    Allergic rhinitis Mother    Hypertension Father    Diabetes Father    Hyperlipidemia Father    Obesity Father    Allergic rhinitis Father    Diabetes Maternal Grandfather    Cancer Maternal Grandfather        prostate   Heart disease Maternal Grandfather    Heart disease Paternal Grandfather    Allergic rhinitis Paternal Aunt    Allergic rhinitis Maternal Grandmother    Asthma Neg Hx    Eczema Neg Hx    Urticaria Neg Hx     Current Outpatient Medications  Medication Sig Dispense Refill   albuterol  (VENTOLIN  HFA) 108 (90 Base) MCG/ACT inhaler Inhale 2 puffs into the lungs every 4 (four) hours as needed for wheezing or shortness of breath. 1 each 3   ALLERGY  RELIEF 180 MG tablet TAKE 1 TABLET(180 MG) BY MOUTH DAILY 90 tablet 1   azelastine  (ASTELIN ) 0.1 % nasal spray Place 1 spray into both nostrils 2 (two) times daily. Use in each nostril as directed 90 mL 1   BLISOVI  FE 1/20 1-20 MG-MCG tablet Take 1 tablet by mouth daily. 84 tablet 4   cyanocobalamin  (VITAMIN B12) 1000 MCG tablet Take by mouth.     cyclobenzaprine (FLEXERIL) 10 MG tablet TAKE 1 TABLET BY MOUTH THREE TIMES DAILY FOR 15 DAYS AS NEEDED FOR  MUSCLE SPASMS     EPINEPHrine  0.3 mg/0.3 mL IJ SOAJ injection Inject 0.3 mg into the muscle as needed for anaphylaxis. 1 each 2   etodolac  (LODINE ) 400 MG tablet Take 1 tablet (400 mg total) by mouth 2 (two) times daily. 60  tablet 5   famotidine  (PEPCID ) 20 MG tablet TAKE 1 TABLET(20 MG) BY MOUTH TWICE DAILY 180 tablet 0   FLUoxetine  (PROZAC ) 40 MG capsule Take 1 capsule (40 mg total) by mouth daily. 90 capsule 0   gabapentin  (NEURONTIN ) 300 MG capsule TAKE 1 CAPSULE BY MOUTH EVERY MORNING, 1 CAPSULE IN THE EVENING, AND 2 CAPSULES AT BEDTIME//Please call and make overdue appt for further refills 120 capsule 0   glycopyrrolate  (ROBINUL ) 1 MG tablet TAKE 1 TABLET BY MOUTH IN THE MORNING, AT NOON, IN THE EVENING, AND AT BEDTIME 360 tablet 0   glycopyrrolate  (ROBINUL ) 1 MG tablet Take 1 tablet (1 mg total) by mouth 2 (two) times daily. Take up to 4 tablets daily 120 tablet 4   HYDROcodone -acetaminophen  (NORCO/VICODIN) 5-325 MG tablet Take 1 tablet by mouth every 6 (six) hours as needed.     levocetirizine (XYZAL ) 5 MG tablet TAKE 1 TABLET(5 MG) BY MOUTH EVERY EVENING 90 tablet 1   Lisdexamfetamine  Dimesylate 50 MG CHEW Chew 0.5 tablets by mouth 2 (two) times daily.     methocarbamol  (ROBAXIN ) 500 MG tablet Take 1 tablet (500 mg total) by mouth 2 (two) times daily as needed for muscle spasms. 60 tablet 0   montelukast  (SINGULAIR ) 10 MG tablet TAKE 1 TABLET(10 MG) BY MOUTH AT BEDTIME. NEED APPOINTMENT FOR ADDITIONAL REFILLS. 90 tablet 1   nystatin  cream (MYCOSTATIN ) Apply 1 Application topically as directed. Apply to affected areas twice daily as needed 30 g 5   omeprazole  (PRILOSEC) 20 MG capsule Take 1 capsule (20 mg total) by mouth daily. 30 capsule 0   ondansetron  (ZOFRAN  ODT) 4 MG disintegrating tablet Take 1 tablet (4 mg total) by mouth every 8 (eight) hours as needed for nausea or vomiting. 20 tablet 0   predniSONE  (DELTASONE ) 10 MG tablet Take 2 tablets once a day for the next 4 days, then take q  tablet on the 5th day, then stop 9 tablet 0   predniSONE  (STERAPRED UNI-PAK 48 TAB) 5 MG (48) TBPK tablet 12 day dosepack po (Patient taking differently: as needed. 12 day dosepack po) 48 tablet 0   RAGWITEK 12 AMB A 1-U SUBL DISSOLVE 1 TABLET UNDER THE TONGUE DAILY. BRING THIS PERSON TO YOUR APPOINTMENT AND YOU WILL TAKE THE 1ST TABLET IN THE CLINIC 30 tablet 5   SYRINGE-NEEDLE, DISP, 3 ML (EASYPOINT NEEDLE/SYRINGE) 25G X 5/8 3 ML MISC Use one needle/syringe to inject Vitamin B12 into the muscle once every 7 days. *Disp. whatever insurance will cover*     tirzepatide  (ZEPBOUND ) 15 MG/0.5ML Pen Inject 15 mg into the skin once a week. 6 mL 0   traMADol (ULTRAM) 50 MG tablet TAKE 1 TABLET BY MOUTH THREE TIMES DAILY FOR 5 DAYS AS NEEDED     traZODone (DESYREL) 100 MG tablet Take 100 mg by mouth at bedtime.     triamcinolone  cream (KENALOG ) 0.1 % Apply 1 Application topically daily as needed. Apply to affected areas twice daily as needed. 80 g 1   Vitamin D , Ergocalciferol , (DRISDOL ) 1.25 MG (50000 UNIT) CAPS capsule Take 1 capsule (50,000 Units total) by mouth every 7 (seven) days. 4 capsule 0   XIIDRA 5 % SOLN      No current facility-administered medications for this visit.    Allergies[1]  ***REVIEW OF SYSTEMS (negative unless checked):   Cardiac:  []  Chest pain or chest pressure? []  Shortness of breath upon activity? []  Shortness of breath when lying flat? []  Irregular heart rhythm?  Vascular:  []  Pain in calf, thigh, or hip brought on by walking? []  Pain in feet at night that wakes you up from your sleep? []  Blood clot in your veins? []  Leg swelling?  Pulmonary:  []  Oxygen at home? []  Productive cough? []  Wheezing?  Neurologic:  []  Sudden weakness in arms or legs? []  Sudden numbness in arms or legs? []  Sudden onset of difficult speaking or slurred speech? []  Temporary loss of vision in one eye? []  Problems with dizziness?  Gastrointestinal:  []  Blood in stool? []   Vomited blood?  Genitourinary:  []  Burning when urinating? []  Blood in urine?  Psychiatric:  []  Major depression  Hematologic:  []  Bleeding problems? []  Problems with blood clotting?  Dermatologic:  []  Rashes or ulcers?  Constitutional:  []  Fever or chills?  Ear/Nose/Throat:  []  Change in hearing? []  Nose bleeds? []  Sore throat?  Musculoskeletal:  []  Back pain? []  Joint pain? []  Muscle pain?   Physical Examination    There were no vitals filed for this visit. There is no height or weight on file to calculate BMI.  General:  WDWN in NAD; vital signs documented above Gait: Not observed HENT: WNL, normocephalic Pulmonary: normal non-labored breathing , without Rales, rhonchi,  wheezing Cardiac: {Desc; regular/irreg:14544} HR, without  Murmurs {With/Without:20273} carotid bruit*** Abdomen: soft, NT, no masses Skin: {With/Without:20273} rashes Vascular Exam/Pulses:  Right Left  Radial {Exam; arterial pulse strength 0-4:30167} {Exam; arterial pulse strength 0-4:30167}  Ulnar {Exam; arterial pulse strength 0-4:30167} {Exam; arterial pulse strength 0-4:30167}  Femoral {Exam; arterial pulse strength 0-4:30167} {Exam; arterial pulse strength 0-4:30167}  Popliteal {Exam; arterial pulse strength 0-4:30167} {Exam; arterial pulse strength 0-4:30167}  DP {Exam; arterial pulse strength 0-4:30167} {Exam; arterial pulse strength 0-4:30167}  PT {Exam; arterial pulse strength 0-4:30167} {Exam; arterial pulse strength 0-4:30167}   Extremities: {With/Without:20273} varicose veins, {With/Without:20273} reticular veins, {With/Without:20273} edema, {With/Without:20273} stasis pigmentation, {With/Without:20273} lipodermatosclerosis, {With/Without:20273} ulcers Musculoskeletal: no muscle wasting or atrophy  Neurologic: A&O X 3;  No focal weakness or paresthesias are detected Psychiatric:  The pt has {Desc; normal/abnormal:11317::Normal} affect.  Non-invasive Vascular Imaging   BLE  Venous Insufficiency Duplex (***):  RLE:  *** DVT and SVT,  *** GSV reflux ***, GSV diameter *** *** SSV reflux ***, *** deep venous reflux  LLE: *** DVT and SVT,  *** GSV reflux ***,  GSV diameter *** *** SSV reflux ***, *** deep venous reflux   Medical Decision Making   Cynthia [name] is a 39 y.o. female who presents with: ***LE chronic venous insufficiency, ***varicose veins with complications  Based on the patient's history and examination, I recommend: ***. I discussed with the patient the use of her 20-30 mm thigh high compression stockings and need for 3 month trial of such. The patient will follow up in 3 months with Dr. FERNAND Thank you for allowing us  to participate in this patient's care.   Teretha Damme, PA-C [facility] Office: [phone]  08/27/2024, 2:32 PM  Clinic MD: [place]    [1]  Allergies Allergen Reactions   Aquacel [Carboxymethylcellulose] Itching, Rash and Other (See Comments)    Caused blisters   Tobramycin Rash   Penicillins     Unknown reaction Has patient had a PCN reaction causing immediate rash, facial/tongue/throat swelling, SOB or lightheadedness with hypotension: Unknown Has patient had a PCN reaction causing severe rash involving mucus membranes or skin necrosis: Unknown Has patient had a PCN reaction that required hospitalization: No Has patient had a PCN  reaction occurring within the last 10 years: No If all of the above answers are NO, then may proceed with Cephalosporin use.  Other reaction(s): unknown as a baby   Silicone Dermatitis    silicones   Adhesive [Tape] Rash    Prefers to use paper tape   Latex Rash   Oxycodone  Itching and Rash   "

## 2024-08-28 MED ORDER — FLUOXETINE HCL 40 MG PO CAPS: 40.0000 mg | ORAL_CAPSULE | Freq: Every day | ORAL | 3 refills | Status: AC

## 2024-08-28 NOTE — Telephone Encounter (Signed)
 Rx refill request approved per Dr. Zollie Pee orders.

## 2024-08-28 NOTE — Telephone Encounter (Signed)
 Refill request received for Fluoxetine  40mg  FOV: None currently scheduled. LOV:06/30/2024 Last refill: Was being filled By Dr Prentiss at a different office.  Medication is pending your approval.

## 2024-08-31 ENCOUNTER — Ambulatory Visit (HOSPITAL_COMMUNITY)

## 2024-09-10 ENCOUNTER — Ambulatory Visit: Admitting: Family Medicine

## 2024-09-16 ENCOUNTER — Encounter: Admitting: Neurology

## 2024-09-28 ENCOUNTER — Ambulatory Visit: Admitting: Family Medicine

## 2024-10-01 ENCOUNTER — Ambulatory Visit: Admitting: Allergy & Immunology

## 2025-05-10 ENCOUNTER — Ambulatory Visit: Admitting: Obstetrics and Gynecology

## 2025-05-17 ENCOUNTER — Ambulatory Visit: Admitting: Obstetrics and Gynecology
